# Patient Record
Sex: Female | Born: 1935 | Race: White | Hispanic: No | Smoking: Never smoker
Health system: Southern US, Community
[De-identification: ages and names within clinical notes are randomized; demographics above are authoritative.]

## PROBLEM LIST (undated history)

## (undated) DIAGNOSIS — M1990 Unspecified osteoarthritis, unspecified site: Secondary | ICD-10-CM

## (undated) DIAGNOSIS — R112 Nausea with vomiting, unspecified: Secondary | ICD-10-CM

## (undated) DIAGNOSIS — F32A Depression, unspecified: Secondary | ICD-10-CM

## (undated) DIAGNOSIS — I10 Essential (primary) hypertension: Secondary | ICD-10-CM

## (undated) DIAGNOSIS — G40909 Epilepsy, unspecified, not intractable, without status epilepticus: Secondary | ICD-10-CM

## (undated) DIAGNOSIS — E538 Deficiency of other specified B group vitamins: Secondary | ICD-10-CM

## (undated) DIAGNOSIS — N189 Chronic kidney disease, unspecified: Secondary | ICD-10-CM

## (undated) DIAGNOSIS — D649 Anemia, unspecified: Secondary | ICD-10-CM

## (undated) DIAGNOSIS — E785 Hyperlipidemia, unspecified: Secondary | ICD-10-CM

## (undated) DIAGNOSIS — Z9889 Other specified postprocedural states: Secondary | ICD-10-CM

## (undated) DIAGNOSIS — G894 Chronic pain syndrome: Secondary | ICD-10-CM

## (undated) DIAGNOSIS — R251 Tremor, unspecified: Secondary | ICD-10-CM

## (undated) DIAGNOSIS — G509 Disorder of trigeminal nerve, unspecified: Secondary | ICD-10-CM

## (undated) HISTORY — PX: CHOLECYSTECTOMY: SHX55

## (undated) HISTORY — PX: TONSILLECTOMY: SUR1361

## (undated) HISTORY — PX: TOTAL KNEE ARTHROPLASTY: SHX125

## (undated) HISTORY — DX: Disorder of trigeminal nerve, unspecified: G50.9

## (undated) HISTORY — DX: Anemia, unspecified: D64.9

## (undated) HISTORY — PX: UMBILICAL HERNIA REPAIR: SHX196

## (undated) HISTORY — DX: Deficiency of other specified B group vitamins: E53.8

## (undated) HISTORY — PX: BLADDER REPAIR: SHX76

## (undated) HISTORY — PX: CATARACT EXTRACTION: SUR2

## (undated) HISTORY — PX: NOSE SURGERY: SHX723

## (undated) HISTORY — DX: Tremor, unspecified: R25.1

## (undated) HISTORY — PX: SHOULDER ARTHROSCOPY DISTAL CLAVICLE EXCISION AND OPEN ROTATOR CUFF REPAIR: SHX2396

## (undated) HISTORY — PX: ABDOMINAL HYSTERECTOMY: SHX81

---

## 2002-10-26 HISTORY — PX: OTHER SURGICAL HISTORY: SHX169

## 2019-11-02 DIAGNOSIS — D519 Vitamin B12 deficiency anemia, unspecified: Secondary | ICD-10-CM | POA: Diagnosis not present

## 2019-11-02 DIAGNOSIS — Z6826 Body mass index (BMI) 26.0-26.9, adult: Secondary | ICD-10-CM | POA: Diagnosis not present

## 2019-11-02 DIAGNOSIS — R42 Dizziness and giddiness: Secondary | ICD-10-CM | POA: Diagnosis not present

## 2019-11-02 DIAGNOSIS — M1710 Unilateral primary osteoarthritis, unspecified knee: Secondary | ICD-10-CM | POA: Diagnosis not present

## 2019-11-02 DIAGNOSIS — G500 Trigeminal neuralgia: Secondary | ICD-10-CM | POA: Diagnosis not present

## 2019-11-06 DIAGNOSIS — H16223 Keratoconjunctivitis sicca, not specified as Sjogren's, bilateral: Secondary | ICD-10-CM | POA: Diagnosis not present

## 2019-11-06 DIAGNOSIS — Z961 Presence of intraocular lens: Secondary | ICD-10-CM | POA: Diagnosis not present

## 2019-11-06 DIAGNOSIS — H40023 Open angle with borderline findings, high risk, bilateral: Secondary | ICD-10-CM | POA: Diagnosis not present

## 2019-11-08 DIAGNOSIS — E875 Hyperkalemia: Secondary | ICD-10-CM | POA: Diagnosis not present

## 2019-11-28 DIAGNOSIS — M25561 Pain in right knee: Secondary | ICD-10-CM | POA: Diagnosis not present

## 2019-11-28 DIAGNOSIS — Z96652 Presence of left artificial knee joint: Secondary | ICD-10-CM | POA: Diagnosis not present

## 2019-11-28 DIAGNOSIS — Z96651 Presence of right artificial knee joint: Secondary | ICD-10-CM | POA: Diagnosis not present

## 2019-11-28 DIAGNOSIS — M25562 Pain in left knee: Secondary | ICD-10-CM | POA: Diagnosis not present

## 2019-12-06 DIAGNOSIS — Z23 Encounter for immunization: Secondary | ICD-10-CM | POA: Diagnosis not present

## 2020-01-03 DIAGNOSIS — Z23 Encounter for immunization: Secondary | ICD-10-CM | POA: Diagnosis not present

## 2020-01-26 DIAGNOSIS — Z6825 Body mass index (BMI) 25.0-25.9, adult: Secondary | ICD-10-CM | POA: Diagnosis not present

## 2020-01-26 DIAGNOSIS — R300 Dysuria: Secondary | ICD-10-CM | POA: Diagnosis not present

## 2020-02-02 DIAGNOSIS — G500 Trigeminal neuralgia: Secondary | ICD-10-CM | POA: Diagnosis not present

## 2020-02-02 DIAGNOSIS — H903 Sensorineural hearing loss, bilateral: Secondary | ICD-10-CM | POA: Diagnosis not present

## 2020-02-06 DIAGNOSIS — L299 Pruritus, unspecified: Secondary | ICD-10-CM | POA: Diagnosis not present

## 2020-02-06 DIAGNOSIS — K146 Glossodynia: Secondary | ICD-10-CM | POA: Diagnosis not present

## 2020-02-06 DIAGNOSIS — K219 Gastro-esophageal reflux disease without esophagitis: Secondary | ICD-10-CM | POA: Diagnosis not present

## 2020-02-06 DIAGNOSIS — G500 Trigeminal neuralgia: Secondary | ICD-10-CM | POA: Diagnosis not present

## 2020-02-06 DIAGNOSIS — R251 Tremor, unspecified: Secondary | ICD-10-CM | POA: Diagnosis not present

## 2020-02-06 DIAGNOSIS — M179 Osteoarthritis of knee, unspecified: Secondary | ICD-10-CM | POA: Diagnosis not present

## 2020-03-01 DIAGNOSIS — H60559 Acute reactive otitis externa, unspecified ear: Secondary | ICD-10-CM | POA: Diagnosis not present

## 2020-03-01 DIAGNOSIS — H6062 Unspecified chronic otitis externa, left ear: Secondary | ICD-10-CM | POA: Diagnosis not present

## 2020-03-07 DIAGNOSIS — G500 Trigeminal neuralgia: Secondary | ICD-10-CM | POA: Diagnosis not present

## 2020-03-07 DIAGNOSIS — Z6825 Body mass index (BMI) 25.0-25.9, adult: Secondary | ICD-10-CM | POA: Diagnosis not present

## 2020-03-07 DIAGNOSIS — I10 Essential (primary) hypertension: Secondary | ICD-10-CM | POA: Diagnosis not present

## 2020-03-07 DIAGNOSIS — M1710 Unilateral primary osteoarthritis, unspecified knee: Secondary | ICD-10-CM | POA: Diagnosis not present

## 2020-03-19 DIAGNOSIS — M79661 Pain in right lower leg: Secondary | ICD-10-CM | POA: Diagnosis not present

## 2020-03-19 DIAGNOSIS — M79662 Pain in left lower leg: Secondary | ICD-10-CM | POA: Diagnosis not present

## 2020-03-19 DIAGNOSIS — Z96653 Presence of artificial knee joint, bilateral: Secondary | ICD-10-CM | POA: Diagnosis not present

## 2020-03-28 DIAGNOSIS — K219 Gastro-esophageal reflux disease without esophagitis: Secondary | ICD-10-CM | POA: Diagnosis not present

## 2020-03-28 DIAGNOSIS — I959 Hypotension, unspecified: Secondary | ICD-10-CM | POA: Diagnosis not present

## 2020-03-28 DIAGNOSIS — F419 Anxiety disorder, unspecified: Secondary | ICD-10-CM | POA: Diagnosis not present

## 2020-03-28 DIAGNOSIS — E7800 Pure hypercholesterolemia, unspecified: Secondary | ICD-10-CM | POA: Diagnosis not present

## 2020-03-28 DIAGNOSIS — Z96652 Presence of left artificial knee joint: Secondary | ICD-10-CM | POA: Diagnosis not present

## 2020-03-28 DIAGNOSIS — R0789 Other chest pain: Secondary | ICD-10-CM | POA: Diagnosis not present

## 2020-03-28 DIAGNOSIS — Z79899 Other long term (current) drug therapy: Secondary | ICD-10-CM | POA: Diagnosis not present

## 2020-03-28 DIAGNOSIS — R918 Other nonspecific abnormal finding of lung field: Secondary | ICD-10-CM | POA: Diagnosis not present

## 2020-03-28 DIAGNOSIS — F329 Major depressive disorder, single episode, unspecified: Secondary | ICD-10-CM | POA: Diagnosis not present

## 2020-03-28 DIAGNOSIS — R079 Chest pain, unspecified: Secondary | ICD-10-CM | POA: Diagnosis not present

## 2020-03-28 DIAGNOSIS — G500 Trigeminal neuralgia: Secondary | ICD-10-CM | POA: Diagnosis not present

## 2020-03-28 DIAGNOSIS — E875 Hyperkalemia: Secondary | ICD-10-CM | POA: Diagnosis not present

## 2020-03-28 DIAGNOSIS — Z79891 Long term (current) use of opiate analgesic: Secondary | ICD-10-CM | POA: Diagnosis not present

## 2020-03-28 DIAGNOSIS — R001 Bradycardia, unspecified: Secondary | ICD-10-CM | POA: Diagnosis not present

## 2020-03-28 DIAGNOSIS — M797 Fibromyalgia: Secondary | ICD-10-CM | POA: Diagnosis not present

## 2020-03-28 DIAGNOSIS — Z791 Long term (current) use of non-steroidal anti-inflammatories (NSAID): Secondary | ICD-10-CM | POA: Diagnosis not present

## 2020-03-28 DIAGNOSIS — I10 Essential (primary) hypertension: Secondary | ICD-10-CM | POA: Diagnosis not present

## 2020-05-10 DIAGNOSIS — Z9181 History of falling: Secondary | ICD-10-CM | POA: Diagnosis not present

## 2020-05-10 DIAGNOSIS — G500 Trigeminal neuralgia: Secondary | ICD-10-CM | POA: Diagnosis not present

## 2020-05-10 DIAGNOSIS — I10 Essential (primary) hypertension: Secondary | ICD-10-CM | POA: Diagnosis not present

## 2020-05-10 DIAGNOSIS — M1710 Unilateral primary osteoarthritis, unspecified knee: Secondary | ICD-10-CM | POA: Diagnosis not present

## 2020-05-10 DIAGNOSIS — R609 Edema, unspecified: Secondary | ICD-10-CM | POA: Diagnosis not present

## 2020-05-10 DIAGNOSIS — Z6825 Body mass index (BMI) 25.0-25.9, adult: Secondary | ICD-10-CM | POA: Diagnosis not present

## 2020-05-13 DIAGNOSIS — H40023 Open angle with borderline findings, high risk, bilateral: Secondary | ICD-10-CM | POA: Diagnosis not present

## 2020-05-13 DIAGNOSIS — H16223 Keratoconjunctivitis sicca, not specified as Sjogren's, bilateral: Secondary | ICD-10-CM | POA: Diagnosis not present

## 2020-05-13 DIAGNOSIS — H5712 Ocular pain, left eye: Secondary | ICD-10-CM | POA: Diagnosis not present

## 2020-05-17 DIAGNOSIS — M6281 Muscle weakness (generalized): Secondary | ICD-10-CM | POA: Diagnosis not present

## 2020-05-17 DIAGNOSIS — R296 Repeated falls: Secondary | ICD-10-CM | POA: Diagnosis not present

## 2020-05-21 DIAGNOSIS — M6281 Muscle weakness (generalized): Secondary | ICD-10-CM | POA: Diagnosis not present

## 2020-05-21 DIAGNOSIS — R296 Repeated falls: Secondary | ICD-10-CM | POA: Diagnosis not present

## 2020-05-22 DIAGNOSIS — R296 Repeated falls: Secondary | ICD-10-CM | POA: Diagnosis not present

## 2020-05-22 DIAGNOSIS — M6281 Muscle weakness (generalized): Secondary | ICD-10-CM | POA: Diagnosis not present

## 2020-05-28 DIAGNOSIS — M6281 Muscle weakness (generalized): Secondary | ICD-10-CM | POA: Diagnosis not present

## 2020-05-28 DIAGNOSIS — R296 Repeated falls: Secondary | ICD-10-CM | POA: Diagnosis not present

## 2020-05-30 DIAGNOSIS — R296 Repeated falls: Secondary | ICD-10-CM | POA: Diagnosis not present

## 2020-05-30 DIAGNOSIS — M6281 Muscle weakness (generalized): Secondary | ICD-10-CM | POA: Diagnosis not present

## 2020-05-31 DIAGNOSIS — M6281 Muscle weakness (generalized): Secondary | ICD-10-CM | POA: Diagnosis not present

## 2020-05-31 DIAGNOSIS — R296 Repeated falls: Secondary | ICD-10-CM | POA: Diagnosis not present

## 2020-06-03 DIAGNOSIS — M6281 Muscle weakness (generalized): Secondary | ICD-10-CM | POA: Diagnosis not present

## 2020-06-03 DIAGNOSIS — R296 Repeated falls: Secondary | ICD-10-CM | POA: Diagnosis not present

## 2020-06-04 DIAGNOSIS — R296 Repeated falls: Secondary | ICD-10-CM | POA: Diagnosis not present

## 2020-06-04 DIAGNOSIS — M6281 Muscle weakness (generalized): Secondary | ICD-10-CM | POA: Diagnosis not present

## 2020-06-07 DIAGNOSIS — M6281 Muscle weakness (generalized): Secondary | ICD-10-CM | POA: Diagnosis not present

## 2020-06-07 DIAGNOSIS — R296 Repeated falls: Secondary | ICD-10-CM | POA: Diagnosis not present

## 2020-06-10 DIAGNOSIS — M6281 Muscle weakness (generalized): Secondary | ICD-10-CM | POA: Diagnosis not present

## 2020-06-10 DIAGNOSIS — R296 Repeated falls: Secondary | ICD-10-CM | POA: Diagnosis not present

## 2020-06-10 DIAGNOSIS — H40023 Open angle with borderline findings, high risk, bilateral: Secondary | ICD-10-CM | POA: Diagnosis not present

## 2020-06-10 DIAGNOSIS — H5712 Ocular pain, left eye: Secondary | ICD-10-CM | POA: Diagnosis not present

## 2020-06-10 DIAGNOSIS — H16223 Keratoconjunctivitis sicca, not specified as Sjogren's, bilateral: Secondary | ICD-10-CM | POA: Diagnosis not present

## 2020-06-13 DIAGNOSIS — R296 Repeated falls: Secondary | ICD-10-CM | POA: Diagnosis not present

## 2020-06-13 DIAGNOSIS — M6281 Muscle weakness (generalized): Secondary | ICD-10-CM | POA: Diagnosis not present

## 2020-06-14 DIAGNOSIS — M6281 Muscle weakness (generalized): Secondary | ICD-10-CM | POA: Diagnosis not present

## 2020-06-14 DIAGNOSIS — R296 Repeated falls: Secondary | ICD-10-CM | POA: Diagnosis not present

## 2020-06-17 DIAGNOSIS — M6281 Muscle weakness (generalized): Secondary | ICD-10-CM | POA: Diagnosis not present

## 2020-06-17 DIAGNOSIS — R296 Repeated falls: Secondary | ICD-10-CM | POA: Diagnosis not present

## 2020-06-20 DIAGNOSIS — M6281 Muscle weakness (generalized): Secondary | ICD-10-CM | POA: Diagnosis not present

## 2020-06-20 DIAGNOSIS — R296 Repeated falls: Secondary | ICD-10-CM | POA: Diagnosis not present

## 2020-06-25 DIAGNOSIS — M6281 Muscle weakness (generalized): Secondary | ICD-10-CM | POA: Diagnosis not present

## 2020-06-25 DIAGNOSIS — R296 Repeated falls: Secondary | ICD-10-CM | POA: Diagnosis not present

## 2020-06-28 DIAGNOSIS — M6281 Muscle weakness (generalized): Secondary | ICD-10-CM | POA: Diagnosis not present

## 2020-06-28 DIAGNOSIS — R296 Repeated falls: Secondary | ICD-10-CM | POA: Diagnosis not present

## 2020-07-02 DIAGNOSIS — R296 Repeated falls: Secondary | ICD-10-CM | POA: Diagnosis not present

## 2020-07-02 DIAGNOSIS — M6281 Muscle weakness (generalized): Secondary | ICD-10-CM | POA: Diagnosis not present

## 2020-07-12 DIAGNOSIS — M6281 Muscle weakness (generalized): Secondary | ICD-10-CM | POA: Diagnosis not present

## 2020-07-12 DIAGNOSIS — M545 Low back pain: Secondary | ICD-10-CM | POA: Diagnosis not present

## 2020-07-12 DIAGNOSIS — S0990XA Unspecified injury of head, initial encounter: Secondary | ICD-10-CM | POA: Diagnosis not present

## 2020-07-12 DIAGNOSIS — M546 Pain in thoracic spine: Secondary | ICD-10-CM | POA: Diagnosis not present

## 2020-07-13 DIAGNOSIS — I119 Hypertensive heart disease without heart failure: Secondary | ICD-10-CM | POA: Diagnosis not present

## 2020-07-13 DIAGNOSIS — N179 Acute kidney failure, unspecified: Secondary | ICD-10-CM | POA: Diagnosis not present

## 2020-07-13 DIAGNOSIS — Z20822 Contact with and (suspected) exposure to covid-19: Secondary | ICD-10-CM | POA: Diagnosis not present

## 2020-07-13 DIAGNOSIS — D649 Anemia, unspecified: Secondary | ICD-10-CM | POA: Diagnosis not present

## 2020-07-13 DIAGNOSIS — I358 Other nonrheumatic aortic valve disorders: Secondary | ICD-10-CM | POA: Diagnosis not present

## 2020-07-13 DIAGNOSIS — I959 Hypotension, unspecified: Secondary | ICD-10-CM | POA: Diagnosis not present

## 2020-07-13 DIAGNOSIS — R9431 Abnormal electrocardiogram [ECG] [EKG]: Secondary | ICD-10-CM | POA: Diagnosis not present

## 2020-07-13 DIAGNOSIS — R600 Localized edema: Secondary | ICD-10-CM | POA: Diagnosis not present

## 2020-07-13 DIAGNOSIS — Z86718 Personal history of other venous thrombosis and embolism: Secondary | ICD-10-CM | POA: Diagnosis not present

## 2020-07-13 DIAGNOSIS — Z79899 Other long term (current) drug therapy: Secondary | ICD-10-CM | POA: Diagnosis not present

## 2020-07-13 DIAGNOSIS — M545 Low back pain: Secondary | ICD-10-CM | POA: Diagnosis not present

## 2020-07-13 DIAGNOSIS — I272 Pulmonary hypertension, unspecified: Secondary | ICD-10-CM | POA: Diagnosis not present

## 2020-07-13 DIAGNOSIS — K219 Gastro-esophageal reflux disease without esophagitis: Secondary | ICD-10-CM | POA: Diagnosis not present

## 2020-07-13 DIAGNOSIS — F419 Anxiety disorder, unspecified: Secondary | ICD-10-CM | POA: Diagnosis not present

## 2020-07-13 DIAGNOSIS — F329 Major depressive disorder, single episode, unspecified: Secondary | ICD-10-CM | POA: Diagnosis not present

## 2020-07-13 DIAGNOSIS — R001 Bradycardia, unspecified: Secondary | ICD-10-CM | POA: Diagnosis not present

## 2020-07-13 DIAGNOSIS — I083 Combined rheumatic disorders of mitral, aortic and tricuspid valves: Secondary | ICD-10-CM | POA: Diagnosis not present

## 2020-07-13 DIAGNOSIS — R296 Repeated falls: Secondary | ICD-10-CM | POA: Diagnosis not present

## 2020-07-13 DIAGNOSIS — R778 Other specified abnormalities of plasma proteins: Secondary | ICD-10-CM | POA: Diagnosis not present

## 2020-07-13 DIAGNOSIS — G500 Trigeminal neuralgia: Secondary | ICD-10-CM | POA: Diagnosis not present

## 2020-07-13 DIAGNOSIS — N2889 Other specified disorders of kidney and ureter: Secondary | ICD-10-CM | POA: Diagnosis not present

## 2020-07-13 DIAGNOSIS — Z66 Do not resuscitate: Secondary | ICD-10-CM | POA: Diagnosis not present

## 2020-07-13 DIAGNOSIS — I517 Cardiomegaly: Secondary | ICD-10-CM | POA: Diagnosis not present

## 2020-07-13 DIAGNOSIS — R519 Headache, unspecified: Secondary | ICD-10-CM | POA: Diagnosis not present

## 2020-07-13 DIAGNOSIS — R569 Unspecified convulsions: Secondary | ICD-10-CM | POA: Diagnosis not present

## 2020-07-13 DIAGNOSIS — S39002A Unspecified injury of muscle, fascia and tendon of lower back, initial encounter: Secondary | ICD-10-CM | POA: Diagnosis not present

## 2020-07-13 DIAGNOSIS — I361 Nonrheumatic tricuspid (valve) insufficiency: Secondary | ICD-10-CM | POA: Diagnosis not present

## 2020-07-13 DIAGNOSIS — M47816 Spondylosis without myelopathy or radiculopathy, lumbar region: Secondary | ICD-10-CM | POA: Diagnosis not present

## 2020-07-13 DIAGNOSIS — I351 Nonrheumatic aortic (valve) insufficiency: Secondary | ICD-10-CM | POA: Diagnosis not present

## 2020-07-13 DIAGNOSIS — E871 Hypo-osmolality and hyponatremia: Secondary | ICD-10-CM | POA: Diagnosis not present

## 2020-07-13 DIAGNOSIS — Z7901 Long term (current) use of anticoagulants: Secondary | ICD-10-CM | POA: Diagnosis not present

## 2020-07-14 DIAGNOSIS — R778 Other specified abnormalities of plasma proteins: Secondary | ICD-10-CM | POA: Diagnosis not present

## 2020-07-14 DIAGNOSIS — I272 Pulmonary hypertension, unspecified: Secondary | ICD-10-CM | POA: Diagnosis not present

## 2020-07-14 DIAGNOSIS — W19XXXA Unspecified fall, initial encounter: Secondary | ICD-10-CM | POA: Diagnosis not present

## 2020-07-14 DIAGNOSIS — Y92099 Unspecified place in other non-institutional residence as the place of occurrence of the external cause: Secondary | ICD-10-CM | POA: Diagnosis not present

## 2020-07-14 DIAGNOSIS — R001 Bradycardia, unspecified: Secondary | ICD-10-CM | POA: Diagnosis not present

## 2020-07-14 DIAGNOSIS — I340 Nonrheumatic mitral (valve) insufficiency: Secondary | ICD-10-CM | POA: Diagnosis not present

## 2020-07-14 DIAGNOSIS — E871 Hypo-osmolality and hyponatremia: Secondary | ICD-10-CM | POA: Diagnosis not present

## 2020-07-14 DIAGNOSIS — I361 Nonrheumatic tricuspid (valve) insufficiency: Secondary | ICD-10-CM | POA: Diagnosis not present

## 2020-07-16 DIAGNOSIS — R2681 Unsteadiness on feet: Secondary | ICD-10-CM | POA: Diagnosis not present

## 2020-07-16 DIAGNOSIS — R278 Other lack of coordination: Secondary | ICD-10-CM | POA: Diagnosis not present

## 2020-07-16 DIAGNOSIS — M6281 Muscle weakness (generalized): Secondary | ICD-10-CM | POA: Diagnosis not present

## 2020-07-18 DIAGNOSIS — R778 Other specified abnormalities of plasma proteins: Secondary | ICD-10-CM | POA: Diagnosis not present

## 2020-07-18 DIAGNOSIS — I10 Essential (primary) hypertension: Secondary | ICD-10-CM | POA: Diagnosis not present

## 2020-07-18 DIAGNOSIS — I272 Pulmonary hypertension, unspecified: Secondary | ICD-10-CM | POA: Diagnosis not present

## 2020-07-18 DIAGNOSIS — I493 Ventricular premature depolarization: Secondary | ICD-10-CM | POA: Diagnosis not present

## 2020-07-25 DIAGNOSIS — M6281 Muscle weakness (generalized): Secondary | ICD-10-CM | POA: Diagnosis not present

## 2020-07-25 DIAGNOSIS — R2681 Unsteadiness on feet: Secondary | ICD-10-CM | POA: Diagnosis not present

## 2020-07-25 DIAGNOSIS — R278 Other lack of coordination: Secondary | ICD-10-CM | POA: Diagnosis not present

## 2020-07-26 DIAGNOSIS — M6281 Muscle weakness (generalized): Secondary | ICD-10-CM | POA: Diagnosis not present

## 2020-07-26 DIAGNOSIS — R278 Other lack of coordination: Secondary | ICD-10-CM | POA: Diagnosis not present

## 2020-07-26 DIAGNOSIS — R2681 Unsteadiness on feet: Secondary | ICD-10-CM | POA: Diagnosis not present

## 2020-07-29 DIAGNOSIS — M6281 Muscle weakness (generalized): Secondary | ICD-10-CM | POA: Diagnosis not present

## 2020-07-29 DIAGNOSIS — R2681 Unsteadiness on feet: Secondary | ICD-10-CM | POA: Diagnosis not present

## 2020-07-29 DIAGNOSIS — R278 Other lack of coordination: Secondary | ICD-10-CM | POA: Diagnosis not present

## 2020-07-30 DIAGNOSIS — R2681 Unsteadiness on feet: Secondary | ICD-10-CM | POA: Diagnosis not present

## 2020-07-30 DIAGNOSIS — M6281 Muscle weakness (generalized): Secondary | ICD-10-CM | POA: Diagnosis not present

## 2020-07-30 DIAGNOSIS — R278 Other lack of coordination: Secondary | ICD-10-CM | POA: Diagnosis not present

## 2020-07-31 DIAGNOSIS — R001 Bradycardia, unspecified: Secondary | ICD-10-CM | POA: Diagnosis not present

## 2020-08-01 DIAGNOSIS — R278 Other lack of coordination: Secondary | ICD-10-CM | POA: Diagnosis not present

## 2020-08-01 DIAGNOSIS — M6281 Muscle weakness (generalized): Secondary | ICD-10-CM | POA: Diagnosis not present

## 2020-08-01 DIAGNOSIS — R2681 Unsteadiness on feet: Secondary | ICD-10-CM | POA: Diagnosis not present

## 2020-08-02 DIAGNOSIS — R278 Other lack of coordination: Secondary | ICD-10-CM | POA: Diagnosis not present

## 2020-08-02 DIAGNOSIS — M6281 Muscle weakness (generalized): Secondary | ICD-10-CM | POA: Diagnosis not present

## 2020-08-02 DIAGNOSIS — R2681 Unsteadiness on feet: Secondary | ICD-10-CM | POA: Diagnosis not present

## 2020-08-05 DIAGNOSIS — R2681 Unsteadiness on feet: Secondary | ICD-10-CM | POA: Diagnosis not present

## 2020-08-05 DIAGNOSIS — M6281 Muscle weakness (generalized): Secondary | ICD-10-CM | POA: Diagnosis not present

## 2020-08-05 DIAGNOSIS — R278 Other lack of coordination: Secondary | ICD-10-CM | POA: Diagnosis not present

## 2020-08-08 DIAGNOSIS — R2681 Unsteadiness on feet: Secondary | ICD-10-CM | POA: Diagnosis not present

## 2020-08-08 DIAGNOSIS — R278 Other lack of coordination: Secondary | ICD-10-CM | POA: Diagnosis not present

## 2020-08-08 DIAGNOSIS — M6281 Muscle weakness (generalized): Secondary | ICD-10-CM | POA: Diagnosis not present

## 2020-08-13 DIAGNOSIS — R2681 Unsteadiness on feet: Secondary | ICD-10-CM | POA: Diagnosis not present

## 2020-08-13 DIAGNOSIS — R278 Other lack of coordination: Secondary | ICD-10-CM | POA: Diagnosis not present

## 2020-08-13 DIAGNOSIS — M6281 Muscle weakness (generalized): Secondary | ICD-10-CM | POA: Diagnosis not present

## 2020-08-14 DIAGNOSIS — R278 Other lack of coordination: Secondary | ICD-10-CM | POA: Diagnosis not present

## 2020-08-14 DIAGNOSIS — M6281 Muscle weakness (generalized): Secondary | ICD-10-CM | POA: Diagnosis not present

## 2020-08-14 DIAGNOSIS — R2681 Unsteadiness on feet: Secondary | ICD-10-CM | POA: Diagnosis not present

## 2020-08-16 DIAGNOSIS — R278 Other lack of coordination: Secondary | ICD-10-CM | POA: Diagnosis not present

## 2020-08-16 DIAGNOSIS — R2681 Unsteadiness on feet: Secondary | ICD-10-CM | POA: Diagnosis not present

## 2020-08-16 DIAGNOSIS — M6281 Muscle weakness (generalized): Secondary | ICD-10-CM | POA: Diagnosis not present

## 2020-08-19 DIAGNOSIS — M6281 Muscle weakness (generalized): Secondary | ICD-10-CM | POA: Diagnosis not present

## 2020-08-19 DIAGNOSIS — R2681 Unsteadiness on feet: Secondary | ICD-10-CM | POA: Diagnosis not present

## 2020-08-19 DIAGNOSIS — R278 Other lack of coordination: Secondary | ICD-10-CM | POA: Diagnosis not present

## 2020-08-22 DIAGNOSIS — R278 Other lack of coordination: Secondary | ICD-10-CM | POA: Diagnosis not present

## 2020-08-22 DIAGNOSIS — R2681 Unsteadiness on feet: Secondary | ICD-10-CM | POA: Diagnosis not present

## 2020-08-22 DIAGNOSIS — M6281 Muscle weakness (generalized): Secondary | ICD-10-CM | POA: Diagnosis not present

## 2020-08-23 DIAGNOSIS — R2681 Unsteadiness on feet: Secondary | ICD-10-CM | POA: Diagnosis not present

## 2020-08-23 DIAGNOSIS — M6281 Muscle weakness (generalized): Secondary | ICD-10-CM | POA: Diagnosis not present

## 2020-08-23 DIAGNOSIS — R278 Other lack of coordination: Secondary | ICD-10-CM | POA: Diagnosis not present

## 2020-08-29 DIAGNOSIS — M25542 Pain in joints of left hand: Secondary | ICD-10-CM | POA: Diagnosis not present

## 2020-08-29 DIAGNOSIS — M25541 Pain in joints of right hand: Secondary | ICD-10-CM | POA: Diagnosis not present

## 2020-08-29 DIAGNOSIS — M6281 Muscle weakness (generalized): Secondary | ICD-10-CM | POA: Diagnosis not present

## 2020-08-29 DIAGNOSIS — R278 Other lack of coordination: Secondary | ICD-10-CM | POA: Diagnosis not present

## 2020-09-02 DIAGNOSIS — M6281 Muscle weakness (generalized): Secondary | ICD-10-CM | POA: Diagnosis not present

## 2020-09-02 DIAGNOSIS — M25541 Pain in joints of right hand: Secondary | ICD-10-CM | POA: Diagnosis not present

## 2020-09-02 DIAGNOSIS — R278 Other lack of coordination: Secondary | ICD-10-CM | POA: Diagnosis not present

## 2020-09-02 DIAGNOSIS — M25542 Pain in joints of left hand: Secondary | ICD-10-CM | POA: Diagnosis not present

## 2020-09-04 DIAGNOSIS — Z23 Encounter for immunization: Secondary | ICD-10-CM | POA: Diagnosis not present

## 2020-09-04 DIAGNOSIS — M25541 Pain in joints of right hand: Secondary | ICD-10-CM | POA: Diagnosis not present

## 2020-09-04 DIAGNOSIS — R278 Other lack of coordination: Secondary | ICD-10-CM | POA: Diagnosis not present

## 2020-09-04 DIAGNOSIS — M25542 Pain in joints of left hand: Secondary | ICD-10-CM | POA: Diagnosis not present

## 2020-09-04 DIAGNOSIS — M6281 Muscle weakness (generalized): Secondary | ICD-10-CM | POA: Diagnosis not present

## 2020-09-06 DIAGNOSIS — H40023 Open angle with borderline findings, high risk, bilateral: Secondary | ICD-10-CM | POA: Diagnosis not present

## 2020-09-06 DIAGNOSIS — H5712 Ocular pain, left eye: Secondary | ICD-10-CM | POA: Diagnosis not present

## 2020-09-06 DIAGNOSIS — H16223 Keratoconjunctivitis sicca, not specified as Sjogren's, bilateral: Secondary | ICD-10-CM | POA: Diagnosis not present

## 2020-09-10 DIAGNOSIS — E7800 Pure hypercholesterolemia, unspecified: Secondary | ICD-10-CM | POA: Diagnosis not present

## 2020-09-10 DIAGNOSIS — R251 Tremor, unspecified: Secondary | ICD-10-CM | POA: Diagnosis not present

## 2020-09-10 DIAGNOSIS — G500 Trigeminal neuralgia: Secondary | ICD-10-CM | POA: Diagnosis not present

## 2020-09-10 DIAGNOSIS — K219 Gastro-esophageal reflux disease without esophagitis: Secondary | ICD-10-CM | POA: Diagnosis not present

## 2020-09-10 DIAGNOSIS — M1710 Unilateral primary osteoarthritis, unspecified knee: Secondary | ICD-10-CM | POA: Diagnosis not present

## 2020-09-10 DIAGNOSIS — D519 Vitamin B12 deficiency anemia, unspecified: Secondary | ICD-10-CM | POA: Diagnosis not present

## 2020-09-10 DIAGNOSIS — E875 Hyperkalemia: Secondary | ICD-10-CM | POA: Diagnosis not present

## 2020-09-10 DIAGNOSIS — I10 Essential (primary) hypertension: Secondary | ICD-10-CM | POA: Diagnosis not present

## 2020-09-17 DIAGNOSIS — M25542 Pain in joints of left hand: Secondary | ICD-10-CM | POA: Diagnosis not present

## 2020-09-17 DIAGNOSIS — M25541 Pain in joints of right hand: Secondary | ICD-10-CM | POA: Diagnosis not present

## 2020-09-17 DIAGNOSIS — M6281 Muscle weakness (generalized): Secondary | ICD-10-CM | POA: Diagnosis not present

## 2020-09-17 DIAGNOSIS — R278 Other lack of coordination: Secondary | ICD-10-CM | POA: Diagnosis not present

## 2020-09-24 DIAGNOSIS — R278 Other lack of coordination: Secondary | ICD-10-CM | POA: Diagnosis not present

## 2020-09-24 DIAGNOSIS — M6281 Muscle weakness (generalized): Secondary | ICD-10-CM | POA: Diagnosis not present

## 2020-09-24 DIAGNOSIS — M25541 Pain in joints of right hand: Secondary | ICD-10-CM | POA: Diagnosis not present

## 2020-09-24 DIAGNOSIS — M25542 Pain in joints of left hand: Secondary | ICD-10-CM | POA: Diagnosis not present

## 2020-09-26 DIAGNOSIS — M6281 Muscle weakness (generalized): Secondary | ICD-10-CM | POA: Diagnosis not present

## 2020-09-26 DIAGNOSIS — M25541 Pain in joints of right hand: Secondary | ICD-10-CM | POA: Diagnosis not present

## 2020-09-26 DIAGNOSIS — M25542 Pain in joints of left hand: Secondary | ICD-10-CM | POA: Diagnosis not present

## 2020-09-26 DIAGNOSIS — R278 Other lack of coordination: Secondary | ICD-10-CM | POA: Diagnosis not present

## 2020-10-04 DIAGNOSIS — R600 Localized edema: Secondary | ICD-10-CM | POA: Diagnosis not present

## 2020-10-04 DIAGNOSIS — I10 Essential (primary) hypertension: Secondary | ICD-10-CM | POA: Diagnosis not present

## 2020-10-07 DIAGNOSIS — R001 Bradycardia, unspecified: Secondary | ICD-10-CM | POA: Diagnosis not present

## 2020-10-22 DIAGNOSIS — M25542 Pain in joints of left hand: Secondary | ICD-10-CM | POA: Diagnosis not present

## 2020-10-22 DIAGNOSIS — M25541 Pain in joints of right hand: Secondary | ICD-10-CM | POA: Diagnosis not present

## 2020-10-22 DIAGNOSIS — M6281 Muscle weakness (generalized): Secondary | ICD-10-CM | POA: Diagnosis not present

## 2020-10-22 DIAGNOSIS — R278 Other lack of coordination: Secondary | ICD-10-CM | POA: Diagnosis not present

## 2020-10-24 DIAGNOSIS — R278 Other lack of coordination: Secondary | ICD-10-CM | POA: Diagnosis not present

## 2020-10-24 DIAGNOSIS — M25541 Pain in joints of right hand: Secondary | ICD-10-CM | POA: Diagnosis not present

## 2020-10-24 DIAGNOSIS — M6281 Muscle weakness (generalized): Secondary | ICD-10-CM | POA: Diagnosis not present

## 2020-10-24 DIAGNOSIS — M25542 Pain in joints of left hand: Secondary | ICD-10-CM | POA: Diagnosis not present

## 2020-10-28 DIAGNOSIS — I10 Essential (primary) hypertension: Secondary | ICD-10-CM | POA: Diagnosis not present

## 2021-01-03 DIAGNOSIS — I10 Essential (primary) hypertension: Secondary | ICD-10-CM | POA: Diagnosis not present

## 2021-01-03 DIAGNOSIS — I272 Pulmonary hypertension, unspecified: Secondary | ICD-10-CM | POA: Diagnosis not present

## 2021-01-13 DIAGNOSIS — R42 Dizziness and giddiness: Secondary | ICD-10-CM | POA: Diagnosis not present

## 2021-01-13 DIAGNOSIS — G500 Trigeminal neuralgia: Secondary | ICD-10-CM | POA: Diagnosis not present

## 2021-01-13 DIAGNOSIS — D519 Vitamin B12 deficiency anemia, unspecified: Secondary | ICD-10-CM | POA: Diagnosis not present

## 2021-01-13 DIAGNOSIS — Z6825 Body mass index (BMI) 25.0-25.9, adult: Secondary | ICD-10-CM | POA: Diagnosis not present

## 2021-01-13 DIAGNOSIS — I10 Essential (primary) hypertension: Secondary | ICD-10-CM | POA: Diagnosis not present

## 2021-04-04 DIAGNOSIS — H5712 Ocular pain, left eye: Secondary | ICD-10-CM | POA: Diagnosis not present

## 2021-04-04 DIAGNOSIS — H16223 Keratoconjunctivitis sicca, not specified as Sjogren's, bilateral: Secondary | ICD-10-CM | POA: Diagnosis not present

## 2021-04-04 DIAGNOSIS — H40023 Open angle with borderline findings, high risk, bilateral: Secondary | ICD-10-CM | POA: Diagnosis not present

## 2021-04-04 DIAGNOSIS — Z961 Presence of intraocular lens: Secondary | ICD-10-CM | POA: Diagnosis not present

## 2021-04-28 DIAGNOSIS — Z20822 Contact with and (suspected) exposure to covid-19: Secondary | ICD-10-CM | POA: Diagnosis not present

## 2021-06-05 DIAGNOSIS — G500 Trigeminal neuralgia: Secondary | ICD-10-CM | POA: Diagnosis not present

## 2021-06-05 DIAGNOSIS — E7800 Pure hypercholesterolemia, unspecified: Secondary | ICD-10-CM | POA: Diagnosis not present

## 2021-06-05 DIAGNOSIS — I10 Essential (primary) hypertension: Secondary | ICD-10-CM | POA: Diagnosis not present

## 2021-06-05 DIAGNOSIS — M1710 Unilateral primary osteoarthritis, unspecified knee: Secondary | ICD-10-CM | POA: Diagnosis not present

## 2021-06-05 DIAGNOSIS — D519 Vitamin B12 deficiency anemia, unspecified: Secondary | ICD-10-CM | POA: Diagnosis not present

## 2021-06-05 DIAGNOSIS — Z6824 Body mass index (BMI) 24.0-24.9, adult: Secondary | ICD-10-CM | POA: Diagnosis not present

## 2021-06-05 DIAGNOSIS — R251 Tremor, unspecified: Secondary | ICD-10-CM | POA: Diagnosis not present

## 2021-07-28 DIAGNOSIS — M25562 Pain in left knee: Secondary | ICD-10-CM | POA: Diagnosis not present

## 2021-07-28 DIAGNOSIS — M545 Low back pain, unspecified: Secondary | ICD-10-CM | POA: Diagnosis not present

## 2021-07-28 DIAGNOSIS — K120 Recurrent oral aphthae: Secondary | ICD-10-CM | POA: Diagnosis not present

## 2021-07-28 DIAGNOSIS — E538 Deficiency of other specified B group vitamins: Secondary | ICD-10-CM | POA: Diagnosis not present

## 2021-07-28 DIAGNOSIS — G479 Sleep disorder, unspecified: Secondary | ICD-10-CM | POA: Diagnosis not present

## 2021-07-28 DIAGNOSIS — R238 Other skin changes: Secondary | ICD-10-CM | POA: Diagnosis not present

## 2021-07-28 DIAGNOSIS — Z87898 Personal history of other specified conditions: Secondary | ICD-10-CM | POA: Diagnosis not present

## 2021-07-28 DIAGNOSIS — M25561 Pain in right knee: Secondary | ICD-10-CM | POA: Diagnosis not present

## 2021-07-28 DIAGNOSIS — G8929 Other chronic pain: Secondary | ICD-10-CM | POA: Diagnosis not present

## 2021-08-04 DIAGNOSIS — Z23 Encounter for immunization: Secondary | ICD-10-CM | POA: Diagnosis not present

## 2021-08-22 DIAGNOSIS — M159 Polyosteoarthritis, unspecified: Secondary | ICD-10-CM | POA: Diagnosis not present

## 2021-08-22 DIAGNOSIS — I272 Pulmonary hypertension, unspecified: Secondary | ICD-10-CM | POA: Diagnosis not present

## 2021-08-22 DIAGNOSIS — G8929 Other chronic pain: Secondary | ICD-10-CM | POA: Diagnosis not present

## 2021-08-22 DIAGNOSIS — K219 Gastro-esophageal reflux disease without esophagitis: Secondary | ICD-10-CM | POA: Diagnosis not present

## 2021-08-22 DIAGNOSIS — M810 Age-related osteoporosis without current pathological fracture: Secondary | ICD-10-CM | POA: Diagnosis not present

## 2021-08-22 DIAGNOSIS — D518 Other vitamin B12 deficiency anemias: Secondary | ICD-10-CM | POA: Diagnosis not present

## 2021-08-22 DIAGNOSIS — M170 Bilateral primary osteoarthritis of knee: Secondary | ICD-10-CM | POA: Diagnosis not present

## 2021-08-22 DIAGNOSIS — F329 Major depressive disorder, single episode, unspecified: Secondary | ICD-10-CM | POA: Diagnosis not present

## 2021-08-28 DIAGNOSIS — Z973 Presence of spectacles and contact lenses: Secondary | ICD-10-CM | POA: Diagnosis not present

## 2021-08-28 DIAGNOSIS — R296 Repeated falls: Secondary | ICD-10-CM | POA: Diagnosis not present

## 2021-08-28 DIAGNOSIS — F329 Major depressive disorder, single episode, unspecified: Secondary | ICD-10-CM | POA: Diagnosis not present

## 2021-08-28 DIAGNOSIS — R7301 Impaired fasting glucose: Secondary | ICD-10-CM | POA: Diagnosis not present

## 2021-08-28 DIAGNOSIS — R42 Dizziness and giddiness: Secondary | ICD-10-CM | POA: Diagnosis not present

## 2021-08-28 DIAGNOSIS — I071 Rheumatic tricuspid insufficiency: Secondary | ICD-10-CM | POA: Diagnosis not present

## 2021-08-28 DIAGNOSIS — G8929 Other chronic pain: Secondary | ICD-10-CM | POA: Diagnosis not present

## 2021-08-28 DIAGNOSIS — R454 Irritability and anger: Secondary | ICD-10-CM | POA: Diagnosis not present

## 2021-09-05 DIAGNOSIS — R7989 Other specified abnormal findings of blood chemistry: Secondary | ICD-10-CM | POA: Diagnosis not present

## 2021-09-06 DIAGNOSIS — Z20828 Contact with and (suspected) exposure to other viral communicable diseases: Secondary | ICD-10-CM | POA: Diagnosis not present

## 2021-09-11 DIAGNOSIS — M48061 Spinal stenosis, lumbar region without neurogenic claudication: Secondary | ICD-10-CM | POA: Diagnosis not present

## 2021-09-11 DIAGNOSIS — M545 Low back pain, unspecified: Secondary | ICD-10-CM | POA: Diagnosis not present

## 2021-09-11 DIAGNOSIS — Z96653 Presence of artificial knee joint, bilateral: Secondary | ICD-10-CM | POA: Diagnosis not present

## 2021-09-16 DIAGNOSIS — M5451 Vertebrogenic low back pain: Secondary | ICD-10-CM | POA: Diagnosis not present

## 2021-09-25 DIAGNOSIS — M5451 Vertebrogenic low back pain: Secondary | ICD-10-CM | POA: Diagnosis not present

## 2021-10-07 ENCOUNTER — Ambulatory Visit (INDEPENDENT_AMBULATORY_CARE_PROVIDER_SITE_OTHER): Payer: Medicare Other | Admitting: Cardiovascular Disease

## 2021-10-07 ENCOUNTER — Other Ambulatory Visit: Payer: Self-pay

## 2021-10-07 ENCOUNTER — Encounter: Payer: Self-pay | Admitting: Cardiovascular Disease

## 2021-10-07 VITALS — BP 130/56 | HR 64 | Ht 64.0 in | Wt 146.4 lb

## 2021-10-07 DIAGNOSIS — I272 Pulmonary hypertension, unspecified: Secondary | ICD-10-CM | POA: Diagnosis not present

## 2021-10-07 DIAGNOSIS — R0989 Other specified symptoms and signs involving the circulatory and respiratory systems: Secondary | ICD-10-CM | POA: Diagnosis not present

## 2021-10-07 DIAGNOSIS — E782 Mixed hyperlipidemia: Secondary | ICD-10-CM

## 2021-10-07 DIAGNOSIS — E785 Hyperlipidemia, unspecified: Secondary | ICD-10-CM | POA: Insufficient documentation

## 2021-10-07 DIAGNOSIS — I071 Rheumatic tricuspid insufficiency: Secondary | ICD-10-CM | POA: Insufficient documentation

## 2021-10-07 DIAGNOSIS — I361 Nonrheumatic tricuspid (valve) insufficiency: Secondary | ICD-10-CM | POA: Diagnosis not present

## 2021-10-07 DIAGNOSIS — I10 Essential (primary) hypertension: Secondary | ICD-10-CM | POA: Diagnosis not present

## 2021-10-07 DIAGNOSIS — R0789 Other chest pain: Secondary | ICD-10-CM | POA: Diagnosis not present

## 2021-10-07 NOTE — Patient Instructions (Addendum)
Medication Instructions:  Your physician recommends that you continue on your current medications as directed. Please refer to the Current Medication list given to you today.  *If you need a refill on your cardiac medications before your next appointment, please call your pharmacy*   Testing/Procedures: Your physician has requested that you have an echocardiogram. Echocardiography is a painless test that uses sound waves to create images of your heart. It provides your doctor with information about the size and shape of your heart and how well your hearts chambers and valves are working. This procedure takes approximately one hour. There are no restrictions for this procedure. This procedure is done at [address].  Your physician has requested that you have a lexiscan myoview. For further information please visit [email or web address] Please follow instruction sheet, as given. This will take place at [address] [facility], [address]  How to prepare for your Myocardial Perfusion Test: Do not eat or drink 3 hours prior to your test, except you may have water. Do not consume products containing caffeine (regular or decaffeinated) 12 hours prior to your test. (ex: coffee, chocolate, sodas, tea). Do bring a list of your current medications with you.  If not listed below, you may take your medications as normal. Do wear comfortable clothes (no dresses or overalls) and walking shoes, tennis shoes preferred (No heels or open toe shoes are allowed). Do NOT wear cologne, perfume, aftershave, or lotions (deodorant is allowed). The test will take approximately 3 to 4 hours to complete If these instructions are not followed, your test will have to be rescheduled.  Your physician has requested that you have a carotid duplex. This test is an ultrasound of the carotid arteries in your neck. It looks at blood flow through these arteries that supply the brain with blood. Allow one hour for this exam. There are  no restrictions or special instructions. This procedure is done at [address] [facility].    Follow-Up: At [facility], you and your health needs are our priority.  As part of our continuing mission to provide you with exceptional heart care, we have created designated Provider Care Teams.  These Care Teams include your primary Cardiologist (physician) and Advanced Practice Providers (APPs -  Physician Assistants and Nurse Practitioners) who all work together to provide you with the care you need, when you need it.  We recommend signing up for the patient portal called "MyChart".  Sign up information is provided on this After Visit Summary.  MyChart is used to connect with patients for Virtual Visits (Telemedicine).  Patients are able to view lab/test results, encounter notes, upcoming appointments, etc.  Non-urgent messages can be sent to your provider as well.   To learn more about what you can do with MyChart, go to [email or web address].    Your next appointment:   12 month(s)  The format for your next appointment:   In Person  Provider:   Nanetta Batty, MD

## 2021-10-07 NOTE — Addendum Note (Signed)
Addended by: Bernita Buffy on: 10/07/2021 04:26 PM   Modules accepted: Orders

## 2021-10-07 NOTE — Assessment & Plan Note (Signed)
History of hyperlipidemia on statin therapy followed by her PCP. 

## 2021-10-07 NOTE — Progress Notes (Signed)
10/07/2021 Kristen [name]   27-Jun-1933  [id number]  Primary Physician Kathalene Frames, MD Primary Cardiologist: Lorretta Harp MD Lupe Carney, [place]  HPI:  Kristen [name] is a 87 y.o. mildly overweight widowed Caucasian female mother of 3 children, grandmother of 3 grandchildren is accompanied by her daughter Manuela Schwartz who is retired Marine scientist and wife of Dr. Julieanne Manson, oncologist.  She is retired Education officer, museum.  She was referred by her primary care provider, Dr. Okey Dupre to be established because of prior history of tricuspid regurgitation.  Her risk factors include treated hypertension and hyperlipidemia.  She is never had a heart attack or stroke.  She has had bilateral total knee replacements.  She had been followed by a cardiologist in [facility] for tricuspid regurgitation.  She recently relocated [place] to the [place] area.  She does get exertional angina which she has had for the last 2 years.  She denies dyspnea on exertion.   Current Meds  Medication Sig   atorvastatin (LIPITOR) 20 MG tablet Take 20 mg by mouth daily.   cyanocobalamin (,VITAMIN B-12,) 1000 MCG/ML injection Inject into the muscle.   DULoxetine (CYMBALTA) 30 MG capsule Take 30 mg by mouth daily.   DULoxetine (CYMBALTA) 60 MG capsule Take 60 mg by mouth daily.   esomeprazole (NEXIUM) 40 MG capsule Take 40 mg by mouth daily.   gabapentin (NEURONTIN) 300 MG capsule Take 300 mg by mouth daily.   HYDROmorphone (DILAUDID) 2 MG tablet Take 2 mg by mouth 4 (four) times daily.   losartan (COZAAR) 50 MG tablet Take 50 mg by mouth daily.   meloxicam (MOBIC) 15 MG tablet Take by mouth.   nebivolol (BYSTOLIC) 5 MG tablet Take 5 mg by mouth daily.   primidone (MYSOLINE) 50 MG tablet Take 50 mg by mouth at bedtime.   traZODone (DESYREL) 100 MG tablet Take 100 mg by mouth at bedtime.     Allergies  Allergen Reactions   Penicillins Hives and Rash    Other reaction(s): Other unknown    Tetracyclines &  Related Anaphylaxis    Other reaction(s): Other unknown    Sulfa Antibiotics Hives and Other (See Comments)    unknown    Social History   Socioeconomic History   Marital status: Widowed    Spouse name: Not on file   Number of children: Not on file   Years of education: Not on file   Highest education level: Not on file  Occupational History   Not on file  Tobacco Use   Smoking status: Never    Passive exposure: Never   Smokeless tobacco: Never  Substance and Sexual Activity   Alcohol use: Not on file   Drug use: Not on file   Sexual activity: Not on file  Other Topics Concern   Not on file  Social History Narrative   Not on file   Social Determinants of Health   Financial Resource Strain: Not on file  Food Insecurity: Not on file  Transportation Needs: Not on file  Physical Activity: Not on file  Stress: Not on file  Social Connections: Not on file  Intimate Partner Violence: Not on file     Review of Systems: General: negative for chills, fever, night sweats or weight changes.  Cardiovascular: negative for chest pain, dyspnea on exertion, edema, orthopnea, palpitations, paroxysmal nocturnal dyspnea or shortness of breath Dermatological: negative for rash Respiratory: negative for cough or wheezing Urologic: negative for hematuria Abdominal: negative for nausea,  vomiting, diarrhea, bright red blood per rectum, melena, or hematemesis Neurologic: negative for visual changes, syncope, or dizziness All other systems reviewed and are otherwise negative except as noted above.    Blood pressure (!) 130/56, pulse 64, height 5\' 4"  (1.626 m), weight 146 lb 6.4 oz (66.4 kg), SpO2 95 %.  General appearance: alert and no distress Neck: no adenopathy, no carotid bruit, no JVD, supple, symmetrical, trachea midline, and thyroid not enlarged, symmetric, no tenderness/mass/nodules Lungs: clear to auscultation bilaterally Heart: regular rate and rhythm, S1, S2 normal, no  murmur, click, rub or gallop Extremities: extremities normal, atraumatic, no cyanosis or edema Pulses: 2+ and symmetric Skin: Skin color, texture, turgor normal. No rashes or lesions Neurologic: Grossly normal  EKG sinus rhythm at 64 without ST or T wave changes.  Personally reviewed this EKG.  ASSESSMENT AND PLAN:   Tricuspid regurgitation History of severe TR probably related to pulmonary hypertension previously followed by a cardiologist in [place].  We will recheck a 2D echocardiogram.  Hyperlipidemia History of hyperlipidemia on statin therapy followed by her PCP  Essential hypertension History of essential hypertension a blood pressure measured today at 130/56.  She is on Bystolic and losartan.  Atypical chest pain 2 years of chest pain which is somewhat exertional in nature.  We talked about whether or not she would be amenable to cardiac catheterization should we find coronary artery disease on noninvasive imaging.  I am going to get a Lexiscan Myoview stress test.  If this is high risk we will have another conversation regarding this, if low risk we will continue to treat her medically.     Bellaire MD FACP,FACC,FAHA, [facility] 10/07/2021 2:52 PM

## 2021-10-07 NOTE — Assessment & Plan Note (Signed)
2 years of chest pain which is somewhat exertional in nature.  We talked about whether or not she would be amenable to cardiac catheterization should we find coronary artery disease on noninvasive imaging.  I am going to get a Lexiscan Myoview stress test.  If this is high risk we will have another conversation regarding this, if low risk we will continue to treat her medically.

## 2021-10-07 NOTE — Assessment & Plan Note (Signed)
History of severe TR probably related to pulmonary hypertension previously followed by a cardiologist in [place].  We will recheck a 2D echocardiogram.

## 2021-10-07 NOTE — Assessment & Plan Note (Signed)
History of essential hypertension a blood pressure measured today at 130/56.  She is on Bystolic and losartan.

## 2021-10-09 NOTE — Addendum Note (Signed)
Addended by: Runell Gess on: 10/09/2021 09:28 AM   Modules accepted: Orders

## 2021-10-13 ENCOUNTER — Telehealth: Payer: Self-pay | Admitting: Cardiovascular Disease

## 2021-10-13 NOTE — Telephone Encounter (Signed)
Patient's daughter states the patient does not want any procedures if the tests find anything. She says if there are medication changes that is fine, but she does not want any procedures. She wants to make sure if the patient does have the carotid, echo, and stress tests that the office knows her decision.

## 2021-10-13 NOTE — Telephone Encounter (Signed)
Message sent to provider's nurse.

## 2021-10-14 DIAGNOSIS — F329 Major depressive disorder, single episode, unspecified: Secondary | ICD-10-CM | POA: Diagnosis not present

## 2021-10-14 DIAGNOSIS — K219 Gastro-esophageal reflux disease without esophagitis: Secondary | ICD-10-CM | POA: Diagnosis not present

## 2021-10-14 DIAGNOSIS — D518 Other vitamin B12 deficiency anemias: Secondary | ICD-10-CM | POA: Diagnosis not present

## 2021-10-14 DIAGNOSIS — M810 Age-related osteoporosis without current pathological fracture: Secondary | ICD-10-CM | POA: Diagnosis not present

## 2021-10-14 DIAGNOSIS — G8929 Other chronic pain: Secondary | ICD-10-CM | POA: Diagnosis not present

## 2021-10-14 DIAGNOSIS — I272 Pulmonary hypertension, unspecified: Secondary | ICD-10-CM | POA: Diagnosis not present

## 2021-10-14 DIAGNOSIS — M159 Polyosteoarthritis, unspecified: Secondary | ICD-10-CM | POA: Diagnosis not present

## 2021-10-14 DIAGNOSIS — M6281 Muscle weakness (generalized): Secondary | ICD-10-CM | POA: Diagnosis not present

## 2021-10-15 NOTE — Telephone Encounter (Signed)
° °  Follow up..  She said she has meeting by 4 pm if someone can call her before 4 pm

## 2021-10-15 NOTE — Telephone Encounter (Signed)
Spoke to pt's daughter. She states "I just didn't want [place] to do all this testing just to cause her to worry. I don't want the only option to be something invasive. We will do the test if there will be other options beside a cath or something like that. I just wanted the office to be aware. We are scheduled for the Lexiscan 12/30" Will relay message to Dr. Allyson Sabal.

## 2021-10-15 NOTE — Telephone Encounter (Signed)
° °  [name] is calling back, she would like to speak with Dr. Hazle Coca nurse regarding pt's tests ordered by Dr. Allyson Sabal

## 2021-10-16 DIAGNOSIS — M6281 Muscle weakness (generalized): Secondary | ICD-10-CM | POA: Diagnosis not present

## 2021-10-21 DIAGNOSIS — M6281 Muscle weakness (generalized): Secondary | ICD-10-CM | POA: Diagnosis not present

## 2021-10-22 ENCOUNTER — Telehealth: Payer: Self-pay | Admitting: Cardiovascular Disease

## 2021-10-22 ENCOUNTER — Telehealth (HOSPITAL_COMMUNITY): Payer: Self-pay | Admitting: *Deleted

## 2021-10-22 ENCOUNTER — Ambulatory Visit (HOSPITAL_COMMUNITY)
Admission: RE | Admit: 2021-10-22 | Payer: 59 | Source: Ambulatory Visit | Attending: Cardiovascular Disease | Admitting: Cardiovascular Disease

## 2021-10-22 NOTE — Telephone Encounter (Signed)
Close encounter 

## 2021-10-22 NOTE — Telephone Encounter (Signed)
Returned call to daughter (DPR) she wanted to ask questions about upcoming testing with pt. All questions answered. Forwarded records request to chart prep.

## 2021-10-22 NOTE — Telephone Encounter (Signed)
Pt is wanting our office to contact Dr. Sharlee Blew @ [facility] office to request her medical records... telephone number is [phone]

## 2021-10-23 DIAGNOSIS — M6281 Muscle weakness (generalized): Secondary | ICD-10-CM | POA: Diagnosis not present

## 2021-10-24 ENCOUNTER — Ambulatory Visit (HOSPITAL_COMMUNITY)
Admission: RE | Admit: 2021-10-24 | Discharge: 2021-10-24 | Disposition: A | Discharge status: Home / Self Care | Payer: Medicare Other | Source: Ambulatory Visit | Attending: Internal Medicine | Admitting: Internal Medicine

## 2021-10-24 ENCOUNTER — Other Ambulatory Visit: Payer: Self-pay

## 2021-10-24 DIAGNOSIS — I272 Pulmonary hypertension, unspecified: Secondary | ICD-10-CM | POA: Diagnosis not present

## 2021-10-24 DIAGNOSIS — R0989 Other specified symptoms and signs involving the circulatory and respiratory systems: Secondary | ICD-10-CM | POA: Insufficient documentation

## 2021-10-24 DIAGNOSIS — I10 Essential (primary) hypertension: Secondary | ICD-10-CM | POA: Insufficient documentation

## 2021-10-24 DIAGNOSIS — I361 Nonrheumatic tricuspid (valve) insufficiency: Secondary | ICD-10-CM | POA: Diagnosis not present

## 2021-10-24 DIAGNOSIS — E782 Mixed hyperlipidemia: Secondary | ICD-10-CM | POA: Diagnosis not present

## 2021-10-24 LAB — MYOCARDIAL PERFUSION IMAGING
LV dias vol: 100 mL (ref 46–106)
LV sys vol: 39 mL
Nuc Stress EF: 61 %
Peak HR: 75 {beats}/min
Rest HR: 56 {beats}/min
Rest Nuclear Isotope Dose: 10.1 mCi
SDS: 0
SRS: 0
SSS: 0
ST Depression (mm): 0 mm
Stress Nuclear Isotope Dose: 29.4 mCi
TID: 1.05

## 2021-10-24 MED ORDER — TECHNETIUM TC 99M TETROFOSMIN IV KIT
29.4000 | PACK | Freq: Once | INTRAVENOUS | Status: AC | PRN
  Administered 2021-10-24: 29.4 via INTRAVENOUS
  Filled 2021-10-24: qty 30

## 2021-10-24 MED ORDER — TECHNETIUM TC 99M TETROFOSMIN IV KIT
10.1000 | PACK | Freq: Once | INTRAVENOUS | Status: AC | PRN
  Administered 2021-10-24: 10.1 via INTRAVENOUS
  Filled 2021-10-24: qty 11

## 2021-10-24 MED ORDER — AMINOPHYLLINE 25 MG/ML IV SOLN
75.0000 mg | Freq: Once | INTRAVENOUS | Status: AC
  Administered 2021-10-24: 75 mg via INTRAVENOUS

## 2021-10-24 MED ORDER — REGADENOSON 0.4 MG/5ML IV SOLN
0.4000 mg | Freq: Once | INTRAVENOUS | Status: AC
Start: 2021-10-24 — End: 2021-10-24
  Administered 2021-10-24: 0.4 mg via INTRAVENOUS

## 2021-11-03 DIAGNOSIS — F329 Major depressive disorder, single episode, unspecified: Secondary | ICD-10-CM | POA: Diagnosis not present

## 2021-11-03 DIAGNOSIS — I272 Pulmonary hypertension, unspecified: Secondary | ICD-10-CM | POA: Diagnosis not present

## 2021-11-03 DIAGNOSIS — G8929 Other chronic pain: Secondary | ICD-10-CM | POA: Diagnosis not present

## 2021-11-03 DIAGNOSIS — K219 Gastro-esophageal reflux disease without esophagitis: Secondary | ICD-10-CM | POA: Diagnosis not present

## 2021-11-03 DIAGNOSIS — M810 Age-related osteoporosis without current pathological fracture: Secondary | ICD-10-CM | POA: Diagnosis not present

## 2021-11-03 DIAGNOSIS — M159 Polyosteoarthritis, unspecified: Secondary | ICD-10-CM | POA: Diagnosis not present

## 2021-11-03 DIAGNOSIS — M170 Bilateral primary osteoarthritis of knee: Secondary | ICD-10-CM | POA: Diagnosis not present

## 2021-11-03 DIAGNOSIS — D509 Iron deficiency anemia, unspecified: Secondary | ICD-10-CM | POA: Diagnosis not present

## 2021-11-03 DIAGNOSIS — N1832 Chronic kidney disease, stage 3b: Secondary | ICD-10-CM | POA: Diagnosis not present

## 2021-11-04 DIAGNOSIS — M6281 Muscle weakness (generalized): Secondary | ICD-10-CM | POA: Diagnosis not present

## 2021-11-06 DIAGNOSIS — M6281 Muscle weakness (generalized): Secondary | ICD-10-CM | POA: Diagnosis not present

## 2021-11-10 ENCOUNTER — Encounter (HOSPITAL_COMMUNITY): Payer: Self-pay | Admitting: Cardiovascular Disease

## 2021-11-10 ENCOUNTER — Ambulatory Visit (HOSPITAL_COMMUNITY): Payer: BC Managed Care – PPO | Attending: Cardiology

## 2021-11-11 DIAGNOSIS — M6281 Muscle weakness (generalized): Secondary | ICD-10-CM | POA: Diagnosis not present

## 2021-11-13 DIAGNOSIS — M6281 Muscle weakness (generalized): Secondary | ICD-10-CM | POA: Diagnosis not present

## 2021-11-18 DIAGNOSIS — M6281 Muscle weakness (generalized): Secondary | ICD-10-CM | POA: Diagnosis not present

## 2021-11-20 DIAGNOSIS — M6281 Muscle weakness (generalized): Secondary | ICD-10-CM | POA: Diagnosis not present

## 2021-11-23 ENCOUNTER — Emergency Department (HOSPITAL_COMMUNITY): Payer: Medicare Other

## 2021-11-23 ENCOUNTER — Other Ambulatory Visit: Payer: Self-pay

## 2021-11-23 ENCOUNTER — Encounter (HOSPITAL_COMMUNITY): Payer: Self-pay | Admitting: Emergency Medicine

## 2021-11-23 ENCOUNTER — Inpatient Hospital Stay (HOSPITAL_COMMUNITY)
Admission: EM | Admit: 2021-11-23 | Discharge: 2021-11-26 | DRG: 175 | Disposition: A | Discharge status: Home / Self Care | Payer: Medicare Other | Attending: Internal Medicine | Admitting: Internal Medicine

## 2021-11-23 DIAGNOSIS — R0789 Other chest pain: Secondary | ICD-10-CM | POA: Diagnosis not present

## 2021-11-23 DIAGNOSIS — G8929 Other chronic pain: Secondary | ICD-10-CM | POA: Diagnosis present

## 2021-11-23 DIAGNOSIS — J9811 Atelectasis: Secondary | ICD-10-CM | POA: Diagnosis not present

## 2021-11-23 DIAGNOSIS — I2510 Atherosclerotic heart disease of native coronary artery without angina pectoris: Secondary | ICD-10-CM | POA: Diagnosis present

## 2021-11-23 DIAGNOSIS — R911 Solitary pulmonary nodule: Secondary | ICD-10-CM | POA: Diagnosis not present

## 2021-11-23 DIAGNOSIS — Z79899 Other long term (current) drug therapy: Secondary | ICD-10-CM

## 2021-11-23 DIAGNOSIS — N1831 Chronic kidney disease, stage 3a: Secondary | ICD-10-CM | POA: Diagnosis present

## 2021-11-23 DIAGNOSIS — J90 Pleural effusion, not elsewhere classified: Secondary | ICD-10-CM | POA: Diagnosis not present

## 2021-11-23 DIAGNOSIS — N200 Calculus of kidney: Secondary | ICD-10-CM | POA: Diagnosis not present

## 2021-11-23 DIAGNOSIS — J9601 Acute respiratory failure with hypoxia: Secondary | ICD-10-CM | POA: Diagnosis present

## 2021-11-23 DIAGNOSIS — I129 Hypertensive chronic kidney disease with stage 1 through stage 4 chronic kidney disease, or unspecified chronic kidney disease: Secondary | ICD-10-CM | POA: Diagnosis not present

## 2021-11-23 DIAGNOSIS — Z20822 Contact with and (suspected) exposure to covid-19: Secondary | ICD-10-CM | POA: Diagnosis not present

## 2021-11-23 DIAGNOSIS — Z66 Do not resuscitate: Secondary | ICD-10-CM | POA: Diagnosis present

## 2021-11-23 DIAGNOSIS — G894 Chronic pain syndrome: Secondary | ICD-10-CM | POA: Diagnosis not present

## 2021-11-23 DIAGNOSIS — I10 Essential (primary) hypertension: Secondary | ICD-10-CM | POA: Diagnosis not present

## 2021-11-23 DIAGNOSIS — R079 Chest pain, unspecified: Secondary | ICD-10-CM | POA: Diagnosis not present

## 2021-11-23 DIAGNOSIS — F32A Depression, unspecified: Secondary | ICD-10-CM | POA: Diagnosis present

## 2021-11-23 DIAGNOSIS — K219 Gastro-esophageal reflux disease without esophagitis: Secondary | ICD-10-CM | POA: Diagnosis present

## 2021-11-23 DIAGNOSIS — I2693 Single subsegmental pulmonary embolism without acute cor pulmonale: Secondary | ICD-10-CM | POA: Diagnosis not present

## 2021-11-23 DIAGNOSIS — D509 Iron deficiency anemia, unspecified: Secondary | ICD-10-CM | POA: Diagnosis present

## 2021-11-23 DIAGNOSIS — I2699 Other pulmonary embolism without acute cor pulmonale: Secondary | ICD-10-CM

## 2021-11-23 DIAGNOSIS — R918 Other nonspecific abnormal finding of lung field: Secondary | ICD-10-CM | POA: Diagnosis present

## 2021-11-23 DIAGNOSIS — Z880 Allergy status to penicillin: Secondary | ICD-10-CM

## 2021-11-23 DIAGNOSIS — M25519 Pain in unspecified shoulder: Secondary | ICD-10-CM

## 2021-11-23 DIAGNOSIS — Z882 Allergy status to sulfonamides status: Secondary | ICD-10-CM

## 2021-11-23 DIAGNOSIS — N1832 Chronic kidney disease, stage 3b: Secondary | ICD-10-CM | POA: Diagnosis present

## 2021-11-23 DIAGNOSIS — G500 Trigeminal neuralgia: Secondary | ICD-10-CM | POA: Diagnosis present

## 2021-11-23 DIAGNOSIS — Z96653 Presence of artificial knee joint, bilateral: Secondary | ICD-10-CM | POA: Diagnosis present

## 2021-11-23 DIAGNOSIS — Z888 Allergy status to other drugs, medicaments and biological substances status: Secondary | ICD-10-CM

## 2021-11-23 DIAGNOSIS — E785 Hyperlipidemia, unspecified: Secondary | ICD-10-CM | POA: Diagnosis present

## 2021-11-23 HISTORY — DX: Epilepsy, unspecified, not intractable, without status epilepticus: G40.909

## 2021-11-23 HISTORY — DX: Essential (primary) hypertension: I10

## 2021-11-23 HISTORY — DX: Chronic pain syndrome: G89.4

## 2021-11-23 HISTORY — DX: Depression, unspecified: F32.A

## 2021-11-23 HISTORY — DX: Hyperlipidemia, unspecified: E78.5

## 2021-11-23 HISTORY — DX: Unspecified osteoarthritis, unspecified site: M19.90

## 2021-11-23 LAB — CBC WITH DIFFERENTIAL/PLATELET
Abs Immature Granulocytes: 0.04 10*3/uL (ref 0.00–0.07)
Basophils Absolute: 0.1 10*3/uL (ref 0.0–0.1)
Basophils Relative: 1 %
Eosinophils Absolute: 0.1 10*3/uL (ref 0.0–0.5)
Eosinophils Relative: 1 %
HCT: 37.2 % (ref 36.0–46.0)
Hemoglobin: 12.2 g/dL (ref 12.0–15.0)
Immature Granulocytes: 0 %
Lymphocytes Relative: 18 %
Lymphs Abs: 1.8 10*3/uL (ref 0.7–4.0)
MCH: 31.6 pg (ref 26.0–34.0)
MCHC: 32.8 g/dL (ref 30.0–36.0)
MCV: 96.4 fL (ref 80.0–100.0)
Monocytes Absolute: 0.9 10*3/uL (ref 0.1–1.0)
Monocytes Relative: 9 %
Neutro Abs: 7 10*3/uL (ref 1.7–7.7)
Neutrophils Relative %: 71 %
Platelets: 196 10*3/uL (ref 150–400)
RBC: 3.86 MIL/uL — ABNORMAL LOW (ref 3.87–5.11)
RDW: 12.1 % (ref 11.5–15.5)
WBC: 9.9 10*3/uL (ref 4.0–10.5)
nRBC: 0 % (ref 0.0–0.2)

## 2021-11-23 LAB — LIPASE, BLOOD: Lipase: 27 U/L (ref 11–51)

## 2021-11-23 LAB — I-STAT CHEM 8, ED
BUN: 19 mg/dL (ref 8–23)
Calcium, Ion: 1.06 mmol/L — ABNORMAL LOW (ref 1.15–1.40)
Chloride: 99 mmol/L (ref 98–111)
Creatinine, Ser: 1.4 mg/dL — ABNORMAL HIGH (ref 0.44–1.00)
Glucose, Bld: 108 mg/dL — ABNORMAL HIGH (ref 70–99)
HCT: 38 % (ref 36.0–46.0)
Hemoglobin: 12.9 g/dL (ref 12.0–15.0)
Potassium: 4.5 mmol/L (ref 3.5–5.1)
Sodium: 136 mmol/L (ref 135–145)
TCO2: 30 mmol/L (ref 22–32)

## 2021-11-23 LAB — COMPREHENSIVE METABOLIC PANEL
ALT: 10 U/L (ref 0–44)
AST: 19 U/L (ref 15–41)
Albumin: 3.8 g/dL (ref 3.5–5.0)
Alkaline Phosphatase: 79 U/L (ref 38–126)
Anion gap: 9 (ref 5–15)
BUN: 16 mg/dL (ref 8–23)
CO2: 27 mmol/L (ref 22–32)
Calcium: 8.3 mg/dL — ABNORMAL LOW (ref 8.9–10.3)
Chloride: 99 mmol/L (ref 98–111)
Creatinine, Ser: 1.39 mg/dL — ABNORMAL HIGH (ref 0.44–1.00)
GFR, Estimated: 37 mL/min — ABNORMAL LOW (ref >60)
Glucose, Bld: 116 mg/dL — ABNORMAL HIGH (ref 70–99)
Potassium: 4.5 mmol/L (ref 3.5–5.1)
Sodium: 135 mmol/L (ref 135–145)
Total Bilirubin: 1.1 mg/dL (ref 0.3–1.2)
Total Protein: 6.4 g/dL — ABNORMAL LOW (ref 6.5–8.1)

## 2021-11-23 LAB — TROPONIN I (HIGH SENSITIVITY): Troponin I (High Sensitivity): 7 ng/L (ref <18)

## 2021-11-23 LAB — RESP PANEL BY RT-PCR (FLU A&B, COVID) ARPGX2
Influenza A by PCR: NEGATIVE
Influenza B by PCR: NEGATIVE
SARS Coronavirus 2 by RT PCR: NEGATIVE

## 2021-11-23 LAB — PROTIME-INR
INR: 1.1 (ref 0.8–1.2)
Prothrombin Time: 13.8 seconds (ref 11.4–15.2)

## 2021-11-23 MED ORDER — LACTATED RINGERS IV SOLN: INTRAVENOUS | Status: AC

## 2021-11-23 MED ORDER — DULOXETINE HCL 30 MG PO CPEP: 30.0000 mg | ORAL_CAPSULE | Freq: Every day | ORAL | Status: DC

## 2021-11-23 MED ORDER — NALOXONE HCL 0.4 MG/ML IJ SOLN: 0.4000 mg | INTRAMUSCULAR | Status: DC | PRN

## 2021-11-23 MED ORDER — MORPHINE SULFATE (PF) 4 MG/ML IV SOLN
4.0000 mg | Freq: Once | INTRAVENOUS | Status: AC
  Administered 2021-11-23: 4 mg via INTRAVENOUS
  Filled 2021-11-23: qty 1

## 2021-11-23 MED ORDER — ONDANSETRON HCL 4 MG/2ML IJ SOLN: 4.0000 mg | Freq: Four times a day (QID) | INTRAMUSCULAR | Status: DC | PRN

## 2021-11-23 MED ORDER — ATORVASTATIN CALCIUM 10 MG PO TABS
20.0000 mg | ORAL_TABLET | Freq: Every day | ORAL | Status: DC
  Administered 2021-11-24 – 2021-11-26 (×3): 20 mg via ORAL
  Filled 2021-11-23 (×3): qty 2

## 2021-11-23 MED ORDER — TRAZODONE HCL 50 MG PO TABS
100.0000 mg | ORAL_TABLET | Freq: Every day | ORAL | Status: DC
Start: 2021-11-23 — End: 2021-11-27
  Administered 2021-11-23 – 2021-11-25 (×3): 100 mg via ORAL
  Filled 2021-11-23 (×3): qty 2

## 2021-11-23 MED ORDER — ONDANSETRON HCL 4 MG/2ML IJ SOLN
4.0000 mg | Freq: Once | INTRAMUSCULAR | Status: AC
  Administered 2021-11-23: 4 mg via INTRAVENOUS
  Filled 2021-11-23: qty 2

## 2021-11-23 MED ORDER — ONDANSETRON HCL 4 MG PO TABS: 4.0000 mg | ORAL_TABLET | Freq: Four times a day (QID) | ORAL | Status: DC | PRN

## 2021-11-23 MED ORDER — HEPARIN BOLUS VIA INFUSION
4000.0000 [IU] | Freq: Once | INTRAVENOUS | Status: AC
Start: 2021-11-23 — End: 2021-11-23
  Administered 2021-11-23: 4000 [IU] via INTRAVENOUS
  Filled 2021-11-23: qty 4000

## 2021-11-23 MED ORDER — ACETAMINOPHEN 650 MG RE SUPP: 650.0000 mg | Freq: Four times a day (QID) | RECTAL | Status: DC | PRN

## 2021-11-23 MED ORDER — OXYCODONE HCL 5 MG PO TABS
5.0000 mg | ORAL_TABLET | ORAL | Status: DC | PRN
  Administered 2021-11-23 – 2021-11-26 (×4): 5 mg via ORAL
  Filled 2021-11-23 (×4): qty 1

## 2021-11-23 MED ORDER — DOCUSATE SODIUM 100 MG PO CAPS
100.0000 mg | ORAL_CAPSULE | Freq: Two times a day (BID) | ORAL | Status: DC
  Administered 2021-11-23 – 2021-11-26 (×6): 100 mg via ORAL
  Filled 2021-11-23 (×6): qty 1

## 2021-11-23 MED ORDER — DICLOFENAC SODIUM 1 % EX GEL
2.0000 g | Freq: Every day | CUTANEOUS | Status: DC
  Administered 2021-11-23 – 2021-11-26 (×3): 2 g via TOPICAL
  Filled 2021-11-23: qty 100

## 2021-11-23 MED ORDER — HEPARIN (PORCINE) 25000 UT/250ML-% IV SOLN
950.0000 [IU]/h | INTRAVENOUS | Status: DC
  Administered 2021-11-23: 16:00:00 1200 [IU]/h via INTRAVENOUS
  Administered 2021-11-24 – 2021-11-25 (×2): 1100 [IU]/h via INTRAVENOUS
  Filled 2021-11-23 (×3): qty 250

## 2021-11-23 MED ORDER — HYDRALAZINE HCL 20 MG/ML IJ SOLN: 5.0000 mg | INTRAMUSCULAR | Status: DC | PRN

## 2021-11-23 MED ORDER — NEBIVOLOL HCL 5 MG PO TABS
5.0000 mg | ORAL_TABLET | Freq: Every day | ORAL | Status: DC
  Administered 2021-11-24 – 2021-11-26 (×3): 5 mg via ORAL
  Filled 2021-11-23 (×4): qty 1

## 2021-11-23 MED ORDER — DULOXETINE HCL 30 MG PO CPEP
30.0000 mg | ORAL_CAPSULE | Freq: Two times a day (BID) | ORAL | Status: DC
  Administered 2021-11-23 – 2021-11-26 (×6): 30 mg via ORAL
  Filled 2021-11-23 (×6): qty 1

## 2021-11-23 MED ORDER — BISACODYL 5 MG PO TBEC: 5.0000 mg | DELAYED_RELEASE_TABLET | Freq: Every day | ORAL | Status: DC | PRN

## 2021-11-23 MED ORDER — HYDROMORPHONE HCL 2 MG PO TABS
2.0000 mg | ORAL_TABLET | Freq: Three times a day (TID) | ORAL | Status: DC | PRN
  Administered 2021-11-23 – 2021-11-26 (×4): 2 mg via ORAL
  Filled 2021-11-23 (×4): qty 1

## 2021-11-23 MED ORDER — GABAPENTIN 300 MG PO CAPS
300.0000 mg | ORAL_CAPSULE | Freq: Every day | ORAL | Status: DC
  Administered 2021-11-24 – 2021-11-26 (×3): 300 mg via ORAL
  Filled 2021-11-23 (×3): qty 1

## 2021-11-23 MED ORDER — ALBUTEROL SULFATE (2.5 MG/3ML) 0.083% IN NEBU: 2.5000 mg | INHALATION_SOLUTION | RESPIRATORY_TRACT | Status: DC | PRN

## 2021-11-23 MED ORDER — DULOXETINE HCL 60 MG PO CPEP: 60.0000 mg | ORAL_CAPSULE | Freq: Every day | ORAL | Status: DC

## 2021-11-23 MED ORDER — SODIUM CHLORIDE 0.9% FLUSH
3.0000 mL | Freq: Two times a day (BID) | INTRAVENOUS | Status: DC
  Administered 2021-11-24 – 2021-11-26 (×3): 3 mL via INTRAVENOUS

## 2021-11-23 MED ORDER — PANTOPRAZOLE SODIUM 40 MG PO TBEC
40.0000 mg | DELAYED_RELEASE_TABLET | Freq: Every day | ORAL | Status: DC
  Administered 2021-11-24 – 2021-11-26 (×3): 40 mg via ORAL
  Filled 2021-11-23 (×3): qty 1

## 2021-11-23 MED ORDER — HYDROMORPHONE HCL 1 MG/ML IJ SOLN
0.5000 mg | INTRAMUSCULAR | Status: DC | PRN
  Administered 2021-11-23 – 2021-11-24 (×2): 1 mg via INTRAVENOUS
  Administered 2021-11-24 (×2): 0.5 mg via INTRAVENOUS
  Administered 2021-11-25 – 2021-11-26 (×5): 1 mg via INTRAVENOUS
  Filled 2021-11-23 (×9): qty 1

## 2021-11-23 MED ORDER — ACETAMINOPHEN 325 MG PO TABS
650.0000 mg | ORAL_TABLET | Freq: Four times a day (QID) | ORAL | Status: DC | PRN
  Administered 2021-11-25 – 2021-11-26 (×2): 650 mg via ORAL
  Filled 2021-11-23 (×2): qty 2

## 2021-11-23 MED ORDER — PRIMIDONE 50 MG PO TABS
50.0000 mg | ORAL_TABLET | Freq: Every day | ORAL | Status: DC
  Administered 2021-11-23 – 2021-11-25 (×3): 50 mg via ORAL
  Filled 2021-11-23 (×4): qty 1

## 2021-11-23 MED ORDER — POLYETHYLENE GLYCOL 3350 17 G PO PACK
17.0000 g | PACK | Freq: Every day | ORAL | Status: DC | PRN
  Administered 2021-11-24 – 2021-11-25 (×2): 17 g via ORAL
  Filled 2021-11-23 (×3): qty 1

## 2021-11-23 MED ORDER — KETAMINE HCL 50 MG/5ML IJ SOSY
10.0000 mg | PREFILLED_SYRINGE | Freq: Once | INTRAMUSCULAR | Status: AC
  Administered 2021-11-23: 10 mg via INTRAVENOUS
  Filled 2021-11-23: qty 5

## 2021-11-23 MED ORDER — IOHEXOL 350 MG/ML SOLN
80.0000 mL | Freq: Once | INTRAVENOUS | Status: AC | PRN
Start: 2021-11-23 — End: 2021-11-23
  Administered 2021-11-23: 80 mL via INTRAVENOUS

## 2021-11-23 NOTE — ED Triage Notes (Signed)
Pt from [place]. Complaints of chest pain in the L breast and armpit area, worse with movement and inspiration.This started at 0800 as patient was waking up- she states "I think I slept on this side". 10/10 pain. Denies n/v/d, dizziness, weakness. Pt states as she continued on with her day the pain has gotten worse, and nothing seems to help. VSS.

## 2021-11-23 NOTE — Assessment & Plan Note (Addendum)
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 -No obvious leg symptoms or findings on exam, but this is academic since she will need lifelong AC with this recurrent clot -Most likely etiology is decreased mobility at her facility; encourage increased mobility, PT/OT consults requested -Will observe on telemetry for now -No current O2 requirement -No R heart strain appreciated on CT; will obtain limited echo for further evaluation -Initiate anticoagulation - for now, will start treatment-dose heparin -she is likely able to transition to alternative [facility] agent tomorrow -We discussed oral AC treatment options and risks/benefits including frequent MD visits and dietary regulation with Coumadin vs. Significant expense with DOAC therapy -Will request TOC team consultation to assist with cost analysis of the various DOAC options based on her insurance -Her biggest issue is her fall risk on DOAC therapy and we discussed this at length -The patient understands that thromboembolic disease can be catastrophic and even deadly and that she must be complaint with physician appointments and lifelong anticoagulation given this recurrent event.

## 2021-11-23 NOTE — Progress Notes (Signed)
ANTICOAGULATION CONSULT NOTE - Initial Consult  Pharmacy Consult for Heparin Indication: pulmonary embolus  Allergies  Allergen Reactions   Penicillins Hives and Rash    Other reaction(s): Other unknown    Tetracyclines & Related Anaphylaxis    Other reaction(s): Other unknown    Sulfa Antibiotics Hives and Other (See Comments)    unknown    Patient Measurements: Height: 5\' 4"  (162.6 cm) IBW/kg (Calculated) : 54.7 Heparin Dosing Weight: 66.4 kg  Vital Signs: Temp: 99.5 F (37.5 C) (01/29 1325) Temp Source: Oral (01/29 1325) BP: 190/88 (01/29 1400) Pulse Rate: 82 (01/29 1400)  Labs: Recent Labs    11/23/21 1340 11/23/21 1406  HGB 12.2 12.9  HCT 37.2 38.0  PLT 196  --   CREATININE 1.39* 1.40*  TROPONINIHS 7  --     CrCl cannot be calculated (Unknown ideal weight.).   Medical History: History reviewed. No pertinent past medical history.  Medications:  (Not in a hospital admission)  Scheduled:  Infusions:  PRN:   Assessment: 68 yof presenting with left-sided chest discomfort. Heparin per pharmacy consult placed for pulmonary embolus.  Patient is not on anticoagulation prior to arrival.  Hgb 12.9; plt 196  Goal of Therapy:  Heparin level 0.3-0.7 units/ml Monitor platelets by anticoagulation protocol: Yes   Plan:  Give 4000 units bolus x 1 Start heparin infusion at 1200 units/hr Check anti-Xa level in 8 hours and daily while on heparin Continue to monitor H&H and platelets  88, PharmD, BCPS 11/23/2021 3:18 PM ED Clinical Pharmacist -  [phone]

## 2021-11-23 NOTE — ED Provider Notes (Signed)
[facility] EMERGENCY DEPARTMENT Provider Note   CSN: [id number] Arrival date & time: 11/23/21  1312     History  Chief Complaint  Patient presents with   Chest Pain    Kristen [name] is a 88 y.o. female.  88 yo F with a chief complaints of left-sided chest discomfort.  This is described as sharp and severe worse with every time she breathes moves or twists.  She denies trauma to the area denies cough congestion or fever.  Denies having pain like this before.  Denies abdominal discomfort.   Chest Pain     Home Medications Prior to Admission medications   Medication Sig Start Date End Date Taking? Authorizing Provider  atorvastatin (LIPITOR) 20 MG tablet Take 20 mg by mouth daily. 09/26/21   [provider]  cyanocobalamin (,VITAMIN B-12,) 1000 MCG/ML injection Inject into the muscle. 11/02/19   [provider]  DULoxetine (CYMBALTA) 30 MG capsule Take 30 mg by mouth daily. 06/05/21   [provider]  DULoxetine (CYMBALTA) 60 MG capsule Take 60 mg by mouth daily. 09/26/21   [provider]  esomeprazole (NEXIUM) 40 MG capsule Take 40 mg by mouth daily. 09/26/21   [provider]  gabapentin (NEURONTIN) 300 MG capsule Take 300 mg by mouth daily. 08/29/21   [provider]  HYDROmorphone (DILAUDID) 2 MG tablet Take 2 mg by mouth 4 (four) times daily. 10/03/21   [provider]  losartan (COZAAR) 50 MG tablet Take 50 mg by mouth daily. 07/28/21   [provider]  meloxicam (MOBIC) 15 MG tablet Take by mouth.    [provider]  nebivolol (BYSTOLIC) 5 MG tablet Take 5 mg by mouth daily.    [provider]  primidone (MYSOLINE) 50 MG tablet Take 50 mg by mouth at bedtime. 08/26/21   [provider]  traZODone (DESYREL) 100 MG tablet Take 100 mg by mouth at bedtime. 08/21/21   [provider]      Allergies    Penicillins, Tetracyclines & related, and Sulfa antibiotics     Review of Systems   Review of Systems  Cardiovascular:  Positive for chest pain.   Physical Exam Updated Vital Signs BP (!) 190/88    Pulse 82    Temp 99.5 F (37.5 C) (Oral)    Resp 19    Ht 5\' 4"  (1.626 m)    LMP  (LMP Unknown)    SpO2 92%    BMI 25.06 kg/m  Physical Exam Vitals and nursing note reviewed.  Constitutional:      General: She is not in acute distress.    Appearance: She is well-developed. She is not diaphoretic.  HENT:     Head: Normocephalic and atraumatic.  Eyes:     Pupils: Pupils are equal, round, and reactive to light.  Cardiovascular:     Rate and Rhythm: Normal rate and regular rhythm.     Heart sounds: No murmur heard.   No friction rub. No gallop.  Pulmonary:     Effort: Pulmonary effort is normal.     Breath sounds: No wheezing or rales.  Chest:     Chest wall: Tenderness present.     Comments: Palpation of the left anterior chest wall about rib 4 through 6 about the medial clavicular line reproduces her discomfort. Abdominal:     General: There is no distension.     Palpations: Abdomen is soft.     Tenderness: There is no abdominal  tenderness.  Musculoskeletal:        General: No tenderness.     Cervical back: Normal range of motion and neck supple.  Skin:    General: Skin is warm and dry.  Neurological:     Mental Status: She is alert and oriented to person, place, and time.  Psychiatric:        Behavior: Behavior normal.    ED Results / Procedures / Treatments   Labs (all labs ordered are listed, but only abnormal results are displayed) Labs Reviewed  CBC WITH DIFFERENTIAL/PLATELET - Abnormal; Notable for the following components:      Result Value   RBC 3.86 (*)    All other components within normal limits  COMPREHENSIVE METABOLIC PANEL - Abnormal; Notable for the following components:   Glucose, Bld 116 (*)    Creatinine, Ser 1.39 (*)    Calcium 8.3 (*)    Total Protein 6.4 (*)    GFR, Estimated 37 (*)    All other components  within normal limits  I-STAT CHEM 8, ED - Abnormal; Notable for the following components:   Creatinine, Ser 1.40 (*)    Glucose, Bld 108 (*)    Calcium, Ion 1.06 (*)    All other components within normal limits  RESP PANEL BY RT-PCR (FLU A&B, COVID) ARPGX2  LIPASE, BLOOD  TROPONIN I (HIGH SENSITIVITY)    EKG EKG Interpretation  Date/Time:  Sunday November 23 2021 13:25:12 EST Ventricular Rate:  70 PR Interval:  170 QRS Duration: 98 QT Interval:  390 QTC Calculation: 421 R Axis:   19 Text Interpretation: Sinus rhythm Probable anterior infarct, old No significant change was found Confirmed by Deno Etienne [phone]) on 11/23/2021 1:26:55 PM  Radiology DG Chest Port 1 View  Result Date: 11/23/2021 CLINICAL DATA:  Chest pain. EXAM: PORTABLE CHEST 1 VIEW COMPARISON:  None. FINDINGS: The heart size and mediastinal contours are within normal limits. Right lung is clear. Elevated left hemidiaphragm is noted with minimal left basilar atelectasis. The visualized skeletal structures are unremarkable. IMPRESSION: Elevated left hemidiaphragm is noted with minimal left basilar atelectasis. Electronically Signed   By: Marijo Conception M.D.   On: 11/23/2021 14:15    Procedures Procedures    Medications Ordered in ED Medications  morphine 4 MG/ML injection 4 mg (4 mg Intravenous Given 11/23/21 1338)  ondansetron (ZOFRAN) injection 4 mg (4 mg Intravenous Given 11/23/21 1339)  ketamine 50 mg in normal saline 5 mL (10 mg/mL) syringe (10 mg Intravenous Given 11/23/21 1505)  iohexol (OMNIPAQUE) 350 MG/ML injection 80 mL (80 mLs Intravenous Contrast Given 11/23/21 1457)    ED Course/ Medical Decision Making/ A&P                           Medical Decision Making Amount and/or Complexity of Data Reviewed Labs: ordered. Radiology: ordered.  Risk Prescription drug management.   Patient is a 88 y.o. female with a cc of left-sided chest discomfort.  This started this morning is worse with deep breathing  moving palpation.  Most likely musculoskeletal by history however the patient is very uncomfortable and is significantly hypertensive rolling around on the bed and uncontrolled discomfort.  Hx of hypertension. Some concern for dissection.  We will obtain a CT angiogram of the chest abdomen pelvis.  Chest x-ray blood work treat pain and nausea.  Reassess.  I reviewed the patients chart and she had a recent nuclear stress test within the  last year that was low risk.  Her PDMP was reviewed and the patient is chronically on 2 mg of oral Dilaudid.  No significant improvement with 4 mg of IV morphine.  With the patient chronically on narcotics it may be difficult to control her pain with the same.  We will give a dose of ketamine.  I independently interpreted the patients labs and imaging chest x-ray without focal infiltrate or pneumothorax.  No significant anemia no significant electrolyte abnormality.  Troponin negative.  I received a call from the radiologist about the patient's dissection study and discussed it with them.  They did not see a dissection but the patient does have pulmonary embolism.  No signs of right heart strain.  Based on the patient's age alone her Pesi score is an indeterminate risk.  She still fairly uncomfortable on repeat exam.  Will start on heparin.  Will discuss with hospitalist for admission.  CRITICAL CARE Performed by: Cecilio Asper   Total critical care time: 35 minutes  Critical care time was exclusive of separately billable procedures and treating other patients.  Critical care was necessary to treat or prevent imminent or life-threatening deterioration.  Critical care was time spent personally by me on the following activities: development of treatment plan with patient and/or surrogate as well as nursing, discussions with consultants, evaluation of patient's response to treatment, examination of patient, obtaining history from patient or surrogate, ordering and  performing treatments and interventions, ordering and review of laboratory studies, ordering and review of radiographic studies, pulse oximetry and re-evaluation of patient's condition.  The patients results and plan were reviewed and discussed.   Any x-rays performed were independently reviewed by myself.   Differential diagnosis were considered with the presenting HPI.  Medications  morphine 4 MG/ML injection 4 mg (4 mg Intravenous Given 11/23/21 1338)  ondansetron (ZOFRAN) injection 4 mg (4 mg Intravenous Given 11/23/21 1339)  ketamine 50 mg in normal saline 5 mL (10 mg/mL) syringe (10 mg Intravenous Given 11/23/21 1505)  iohexol (OMNIPAQUE) 350 MG/ML injection 80 mL (80 mLs Intravenous Contrast Given 11/23/21 1457)    Vitals:   11/23/21 1324 11/23/21 1325 11/23/21 1400  BP:  (!) 209/65 (!) 190/88  Pulse: 74 (!) 26 82  Resp: 20 (!) 22 19  Temp:  99.5 F (37.5 C)   TempSrc:  Oral   SpO2:  96% 92%  Height:  5\' 4"  (1.626 m)     Final diagnoses:  Single subsegmental pulmonary embolism without acute cor pulmonale (HCC)    Admission/ observation were discussed with the admitting physician, patient and/or family and they are comfortable with the plan.         Final Clinical Impression(s) / ED Diagnoses Final diagnoses:  Single subsegmental pulmonary embolism without acute cor pulmonale [facility])    Rx / DC Orders ED Discharge Orders     None         Deno Etienne, DO 11/23/21 1518

## 2021-11-23 NOTE — Assessment & Plan Note (Signed)
-  Continue Lipitor °

## 2021-11-23 NOTE — Assessment & Plan Note (Signed)
-  I have discussed code status with the patient and her daughter and  they are in agreement that the patient would not desire resuscitation and would prefer to die a natural death should that situation arise. ?-She will need a gold out of facility DNR form at the time of discharge ?

## 2021-11-23 NOTE — Assessment & Plan Note (Signed)
-  Needs outpatient pulmonology f/u -She does not have a smoking history

## 2021-11-23 NOTE — Progress Notes (Signed)
Received patient from ED on 12000 units of heparin and O2 94% on room air. Patient with daughter at bedside, resting comfortably.

## 2021-11-23 NOTE — ED Notes (Signed)
Pt ambulated to the RR and back to room pt did well

## 2021-11-23 NOTE — Assessment & Plan Note (Addendum)
-  She has had long-standing pain from trigeminal neuralgia and is on impressive medications for this issue. -I have reviewed this patient in the [place] Controlled Substances Reporting System.  She is receiving medications from only one provider and appears to be taking them as prescribed. -She is at high risk of opioid misuse, diversion, or overdose. -Continue Trazodone, PO Dilaudid, Neurontin, and Cymbalta -Will add prn IV Dilaudid since she likely has increased pain sensitivity -Will order prn Narcan

## 2021-11-23 NOTE — Assessment & Plan Note (Signed)
-  Continue Nebivolol -Hold Cozaar due to renal dysfunction -Will add prn IV hydralazine

## 2021-11-23 NOTE — H&P (Signed)
History and Physical    Patient: Kristen [name] [id number] DOB: 01/12/1933 DOA: 11/23/2021 DOS: the patient was seen and examined on 11/23/2021 PCP: Kathalene Frames, MD  Patient coming from: ALF/ILF - [facility]; NOK: Daughter, Miles Costain, [phone]   Chief Complaint: CP  HPI: Kristen [name] is a 88 y.o. female with medical history significant of chronic pain; seizure d/o; depression; HTN; and HLD presenting with left-sided CP.  She reports that she developed horrible pain in her L chest near her breast and around to her back.  The pain started this AM when she got up and worsened.  About noon, she was "about the croak."  No h/o DVT post-operatively in the past.  She is less mobile recently, trying to avoid COVID exposure.  She goes to PT 2x/week, rose the bike and did balance exercises.  No leg symptoms.  +SOB, hurt too much to breathe.  No cough.  No sick contacts.  No recent blood thinners.  Her pain is better but still present.  Lasix has been on hold for 2-3 months due to concerns about lab abnormalities.    ER Course:  Chronic pain with CP.  Dissection study shows PE.  Intractable pain, mild O2 requirement after opioids, ketamine.     Review of Systems: As mentioned in the history of present illness. All other systems reviewed and are negative. Past Medical History:  Diagnosis Date   Arthritis    Chronic pain disorder    trigeminal neuralgia   Depression    Dyslipidemia    Hypertension    Seizure disorder ([place])    decades since last seizure   Past Surgical History:  Procedure Laterality Date   TOTAL KNEE ARTHROPLASTY Bilateral    Social History:  reports that she has never smoked. She has never been exposed to tobacco smoke. She has never used smokeless tobacco. She reports that she does not currently use alcohol. She reports that she does not use drugs.  Allergies  Allergen Reactions   Penicillins Hives and Rash    Other reaction(s): Other unknown     Tetracyclines & Related Anaphylaxis    Other reaction(s): Other unknown    Sulfa Antibiotics Hives and Other (See Comments)    unknown    History reviewed. No pertinent family history.  Prior to Admission medications   Medication Sig Start Date End Date Taking? Authorizing Provider  atorvastatin (LIPITOR) 20 MG tablet Take 20 mg by mouth daily. 09/26/21   [provider]  cyanocobalamin (,VITAMIN B-12,) 1000 MCG/ML injection Inject into the muscle. 11/02/19   [provider]  DULoxetine (CYMBALTA) 30 MG capsule Take 30 mg by mouth daily. 06/05/21   [provider]  DULoxetine (CYMBALTA) 60 MG capsule Take 60 mg by mouth daily. 09/26/21   [provider]  esomeprazole (NEXIUM) 40 MG capsule Take 40 mg by mouth daily. 09/26/21   [provider]  gabapentin (NEURONTIN) 300 MG capsule Take 300 mg by mouth daily. 08/29/21   [provider]  HYDROmorphone (DILAUDID) 2 MG tablet Take 2 mg by mouth 4 (four) times daily. 10/03/21   [provider]  losartan (COZAAR) 50 MG tablet Take 50 mg by mouth daily. 07/28/21   [provider]  meloxicam (MOBIC) 15 MG tablet Take by mouth.    [provider]  nebivolol (BYSTOLIC) 5 MG tablet Take 5 mg by mouth daily.    [provider]  primidone (MYSOLINE) 50 MG tablet Take 50 mg by mouth  at bedtime. 08/26/21   [provider]  traZODone (DESYREL) 100 MG tablet Take 100 mg by mouth at bedtime. 08/21/21   [provider]    Physical Exam: Vitals:   11/23/21 1600 11/23/21 1630 11/23/21 1727 11/23/21 1817  BP: (!) 191/71 (!) 183/59 (!) 169/74 (!) 156/68  Pulse: 95 99 93 91  Resp: (!) 21 (!) 25 20 18   Temp:   100 F (37.8 C)   TempSrc:   Oral   SpO2: 100% 91% 94% 100%  Weight:      Height:       General:  Appears calm and comfortable and is in NAD Eyes:  PERRL, EOMI, normal lids, iris ENT:  grossly normal hearing, lips & tongue, mmm Neck:  no LAD,  masses or thyromegaly Cardiovascular:  RRR, no m/r/g. No LE edema.  Respiratory:   CTA bilaterally with no wheezes/rales/rhonchi.  Mildly increased respiratory effort. Abdomen:  soft, NT, ND Skin:  no rash or induration seen on limited exam Musculoskeletal:  grossly normal tone BUE/BLE, good ROM, no bony abnormality Psychiatric:  grossly normal mood and affect, speech fluent and appropriate, AOx3 Neurologic:  CN 2-12 grossly intact, moves all extremities in coordinated fashion   Radiological Exams on Admission: Independently reviewed - see discussion in A/P where applicable  DG Chest Port 1 View  Result Date: 11/23/2021 CLINICAL DATA:  Chest pain. EXAM: PORTABLE CHEST 1 VIEW COMPARISON:  None. FINDINGS: The heart size and mediastinal contours are within normal limits. Right lung is clear. Elevated left hemidiaphragm is noted with minimal left basilar atelectasis. The visualized skeletal structures are unremarkable. IMPRESSION: Elevated left hemidiaphragm is noted with minimal left basilar atelectasis. Electronically Signed   By: Marijo Conception M.D.   On: 11/23/2021 14:15   CT Angio Chest/Abd/Pel for Dissection W and/or Wo Contrast  Result Date: 11/23/2021 CLINICAL DATA:  Chest pain or back pain, aortic dissection suspected EXAM: CT ANGIOGRAPHY CHEST, ABDOMEN AND PELVIS TECHNIQUE: Non-contrast CT of the chest was initially obtained. Multidetector CT imaging through the chest, abdomen and pelvis was performed using the standard protocol during bolus administration of intravenous contrast. Multiplanar reconstructed images and MIPs were obtained and reviewed to evaluate the vascular anatomy. RADIATION DOSE REDUCTION: This exam was performed according to the departmental dose-optimization program which includes automated exposure control, adjustment of the mA and/or kV according to patient size and/or use of iterative reconstruction technique. CONTRAST:  5mL OMNIPAQUE IOHEXOL 350 MG/ML SOLN  COMPARISON:  Chest x-ray 11/23/2021 FINDINGS: CTA CHEST FINDINGS Cardiovascular: Satisfactory opacification of the pulmonary arteries to the segmental level. Nonocclusive distal central left pulmonary embolus extending to the left upper and left lower segmental levels. Normal heart size. No significant pericardial effusion. The thoracic aorta is normal in caliber. No definite aortic dissection. Mild atherosclerotic plaque of the thoracic aorta. At least 2 vessel coronary artery calcifications. Mediastinum/Nodes: No enlarged mediastinal, hilar, or axillary lymph nodes. Thyroid gland, trachea, and esophagus demonstrate no significant findings. Elevated left hemidiaphragm. Lungs/Pleura: Right upper lobe centrally solid and peripherally ground-glass airspace opacity with the solid portions appearing nodular-like measuring 2.2 x 2.8 cm (8:28). Subpleural left upper lobe pulmonary micronodule (8:29). Subpleural left apical calcified pulmonary micronodule (8:14). Pulmonary micronodule within the right middle lobe (8:55). No pulmonary mass. No pleural effusion. No pneumothorax. Musculoskeletal: No chest wall abnormality. No suspicious lytic or blastic osseous lesions. No acute displaced fracture. Old healed left rib fractures. Multilevel degenerative changes of the spine. T11 vertebral body hemangioma. Review of the MIP  images confirms the above findings. CTA ABDOMEN AND PELVIS FINDINGS VASCULAR Aorta: Severe atherosclerotic plaque. Normal caliber aorta without aneurysm, dissection, vasculitis or significant stenosis. Celiac: Patent without evidence of aneurysm, dissection, vasculitis or significant stenosis. SMA: Patent without evidence of aneurysm, dissection, vasculitis or significant stenosis. Renals: Mild to moderate atherosclerotic plaque at the origin of the renal arteries. Both renal arteries are patent without evidence of aneurysm, dissection, vasculitis, fibromuscular dysplasia or significant stenosis. IMA: Patent  without evidence of aneurysm, dissection, vasculitis or significant stenosis. Inflow: Patent without evidence of aneurysm, dissection, vasculitis or significant stenosis. Veins: No obvious venous abnormality within the limitations of this arterial phase study. Review of the MIP images confirms the above findings. NON-VASCULAR Hepatobiliary: No focal liver abnormality. Status post cholecystectomy. No biliary dilatation. Pancreas: No focal lesion. Normal pancreatic contour. No surrounding inflammatory changes. No main pancreatic ductal dilatation. Spleen: Normal in size without focal abnormality. Adrenals/Urinary Tract: No adrenal nodule bilaterally. Bilateral kidneys enhance symmetrically. Subcentimeter hypodensities are too small to characterize. Couple of fluid density lesions within bilateral kidneys likely represent simple renal cysts. Couple of calcifications adjacent to a simple renal cyst within the inferior pole of right kidney measuring up to 6 mm. No hydronephrosis. No hydroureter. The urinary bladder is unremarkable. Stomach/Bowel: Stomach is within normal limits. No evidence of bowel wall thickening or dilatation. The appendix is not definitely identified with no inflammatory changes in the right lower quadrant to suggest acute appendicitis. Lymphatic: No lymphadenopathy. Reproductive: Status post hysterectomy. No adnexal masses. Other: No intraperitoneal free fluid. No intraperitoneal free gas. No organized fluid collection. Musculoskeletal: No abdominal wall hernia or abnormality. No suspicious lytic or blastic osseous lesions. No acute displaced fracture. Multilevel degenerative changes of the spine. Grade 1 anterolisthesis of L4 on L5. Review of the MIP images confirms the above findings. IMPRESSION: 1. Nonocclusive distal central left pulmonary embolus extending to the left upper and left lower segmental levels. No heart strain or pulmonary infarction. 2. A 2.2 x 2.8 cm right upper lobe solid and  ground-glass airspace opacity that is nodular-like centrally. Finding could represent infection versus inflammation versus malignancy. Additional imaging evaluation or consultation with Pulmonology or Thoracic Surgery recommended. 3. No acute aortic abnormality. Aortic Atherosclerosis (ICD10-I70.0). 4. Trace left pleural effusion. 5. Nonobstructive right nephrolithiasis measuring up to 6 mm. 6. Other imaging findings of potential clinical significance: Several bilateral scattered pulmonary micronodules within the lungs some calcified and some not. No follow-up needed if patient is low-risk (and has no known or suspected primary neoplasm). Non-contrast chest CT can be considered in 12 months if patient is high-risk. This recommendation follows the consensus statement: Guidelines for Management of Incidental Pulmonary Nodules Detected on CT Images: From the Fleischner Society 2017; Radiology 2017; 284:228-243. These results were called by telephone at the time of interpretation on 11/23/2021 at 3:05 pm to provider DAN FLOYD , who verbally acknowledged these results. Electronically Signed   By: Iven Finn M.D.   On: 11/23/2021 15:22    EKG: Independently reviewed.  NSR with rate 70; no evidence of acute ischemia   Labs on Admission: I have personally reviewed the available labs and imaging studies at the time of the admission.  Pertinent labs:    BUN 16/Creatinine 1.39/GFR 37 - stable based on Care Everywhere records HS troponin 7 Unremarkable CBC   Assessment/Plan * Acute pulmonary embolism without acute cor pulmonale (HCC)- (present on admission) -Patient with 1 prior episode of thromboembolic disease presenting with new PE -No obvious leg symptoms  or findings on exam, but this is academic since she will need lifelong AC with this recurrent clot -Most likely etiology is decreased mobility at her facility; encourage increased mobility, PT/OT consults requested -Will observe on telemetry for  now -No current O2 requirement -No R heart strain appreciated on CT; will obtain limited echo for further evaluation -Initiate anticoagulation - for now, will start treatment-dose heparin -she is likely able to transition to alternative [facility] agent tomorrow -We discussed oral AC treatment options and risks/benefits including frequent MD visits and dietary regulation with Coumadin vs. Significant expense with DOAC therapy -Will request TOC team consultation to assist with cost analysis of the various DOAC options based on her insurance -Her biggest issue is her fall risk on DOAC therapy and we discussed this at length -The patient understands that thromboembolic disease can be catastrophic and even deadly and that she must be complaint with physician appointments and lifelong anticoagulation given this recurrent event.  DNR (do not resuscitate)- (present on admission) -I have discussed code status with the patient and her daughter and  they are in agreement that the patient would not desire resuscitation and would prefer to die a natural death should that situation arise. -She will need a gold out of facility DNR form at the time of discharge  Stage 3b chronic kidney disease (CKD) ([place])- (present on admission) -Appears to be stable -Hold Cozaar for now -Repeat BMP in AM  Chronic pain disorder- (present on admission) -She has had long-standing pain from trigeminal neuralgia and is on impressive medications for this issue. -I have reviewed this patient in the [place] Controlled Substances Reporting System.  She is receiving medications from only one provider and appears to be taking them as prescribed. -She is at high risk of opioid misuse, diversion, or overdose. -Continue Trazodone, PO Dilaudid, Neurontin, and Cymbalta -Will add prn IV Dilaudid since she likely has increased pain sensitivity -Will order prn Narcan  Pulmonary nodule, right- (present on admission) -Needs outpatient pulmonology  f/u -She does not have a smoking history  Essential hypertension- (present on admission) -Continue Nebivolol -Hold Cozaar due to renal dysfunction -Will add prn IV hydralazine  Hyperlipidemia- (present on admission) -Continue Lipitor    Advance Care Planning:   Code Status: DNR   Consults: PT/OT/TOC team  Family Communication: Daughter was present throughout evaluation  Severity of Illness: The appropriate patient status for this patient is OBSERVATION. Observation status is judged to be reasonable and necessary in order to provide the required intensity of service to ensure the patient's safety. The patient's presenting symptoms, physical exam findings, and initial radiographic and laboratory data in the context of their medical condition is felt to place them at decreased risk for further clinical deterioration. Furthermore, it is anticipated that the patient will be medically stable for discharge from the hospital within 2 midnights of admission.   Author: Karmen Bongo, MD 11/23/2021 6:27 PM  For on call review [email or web address].

## 2021-11-23 NOTE — Assessment & Plan Note (Addendum)
-  Appears to be stable -Hold Cozaar for now -Repeat BMP in AM

## 2021-11-24 ENCOUNTER — Other Ambulatory Visit (HOSPITAL_COMMUNITY): Payer: Self-pay

## 2021-11-24 ENCOUNTER — Observation Stay (HOSPITAL_BASED_OUTPATIENT_CLINIC_OR_DEPARTMENT_OTHER): Payer: Medicare Other

## 2021-11-24 DIAGNOSIS — I2699 Other pulmonary embolism without acute cor pulmonale: Secondary | ICD-10-CM | POA: Diagnosis not present

## 2021-11-24 DIAGNOSIS — I2609 Other pulmonary embolism with acute cor pulmonale: Secondary | ICD-10-CM | POA: Diagnosis not present

## 2021-11-24 DIAGNOSIS — I10 Essential (primary) hypertension: Secondary | ICD-10-CM | POA: Diagnosis not present

## 2021-11-24 DIAGNOSIS — Z66 Do not resuscitate: Secondary | ICD-10-CM | POA: Diagnosis not present

## 2021-11-24 DIAGNOSIS — E782 Mixed hyperlipidemia: Secondary | ICD-10-CM

## 2021-11-24 DIAGNOSIS — G894 Chronic pain syndrome: Secondary | ICD-10-CM | POA: Diagnosis not present

## 2021-11-24 DIAGNOSIS — R911 Solitary pulmonary nodule: Secondary | ICD-10-CM | POA: Diagnosis not present

## 2021-11-24 DIAGNOSIS — N1832 Chronic kidney disease, stage 3b: Secondary | ICD-10-CM | POA: Diagnosis not present

## 2021-11-24 LAB — ECHOCARDIOGRAM LIMITED
AR max vel: 1.34 cm2
AV Area VTI: 1.51 cm2
AV Area mean vel: 1.31 cm2
AV Mean grad: 7 mmHg
AV Peak grad: 11 mmHg
Ao pk vel: 1.66 m/s
Calc EF: 66.7 %
Height: 64 in
P 1/2 time: 564 msec
S' Lateral: 2.1 cm
Single Plane A2C EF: 67.1 %
Single Plane A4C EF: 66.8 %
Weight: 2342.17 oz

## 2021-11-24 LAB — BASIC METABOLIC PANEL
Anion gap: 8 (ref 5–15)
BUN: 14 mg/dL (ref 8–23)
CO2: 25 mmol/L (ref 22–32)
Calcium: 8 mg/dL — ABNORMAL LOW (ref 8.9–10.3)
Chloride: 100 mmol/L (ref 98–111)
Creatinine, Ser: 1.19 mg/dL — ABNORMAL HIGH (ref 0.44–1.00)
GFR, Estimated: 45 mL/min — ABNORMAL LOW (ref >60)
Glucose, Bld: 122 mg/dL — ABNORMAL HIGH (ref 70–99)
Potassium: 4.1 mmol/L (ref 3.5–5.1)
Sodium: 133 mmol/L — ABNORMAL LOW (ref 135–145)

## 2021-11-24 LAB — CBC
HCT: 30 % — ABNORMAL LOW (ref 36.0–46.0)
Hemoglobin: 10.2 g/dL — ABNORMAL LOW (ref 12.0–15.0)
MCH: 32.3 pg (ref 26.0–34.0)
MCHC: 34 g/dL (ref 30.0–36.0)
MCV: 94.9 fL (ref 80.0–100.0)
Platelets: 147 10*3/uL — ABNORMAL LOW (ref 150–400)
RBC: 3.16 MIL/uL — ABNORMAL LOW (ref 3.87–5.11)
RDW: 11.9 % (ref 11.5–15.5)
WBC: 10.1 10*3/uL (ref 4.0–10.5)
nRBC: 0 % (ref 0.0–0.2)

## 2021-11-24 LAB — HEPARIN LEVEL (UNFRACTIONATED)
Heparin Unfractionated: 0.71 IU/mL — ABNORMAL HIGH (ref 0.30–0.70)
Heparin Unfractionated: 0.75 IU/mL — ABNORMAL HIGH (ref 0.30–0.70)

## 2021-11-24 NOTE — TOC Benefit Eligibility Note (Addendum)
Patient Advocate Encounter ° °Insurance verification completed.   ° °The patient is currently admitted and upon discharge could be taking Eliquis 5 mg. ° °The current 30 day co-pay is, $40.00.  ° °The patient is currently admitted and upon discharge could be taking Xarelto 20 mg. ° °The current 30 day co-pay is, $40.00.  ° °The patient is insured through [employer] Medicare Part D  ° ° °Jaqueline Uber, CPhT °Pharmacy Patient Advocate Specialist °[facility] Patient Advocate Team °Direct Number: [phone]  Fax: [phone] ° ° ° ° ° °  °

## 2021-11-24 NOTE — Progress Notes (Signed)
OT Cancellation Note  Patient Details Name: Kristen [name] MRN: [id number] DOB: 09-20-1933   Cancelled Treatment:    Reason Eval/Treat Not Completed: Patient not medically ready (acute PE, not on heparin for 24 hrs per protocol, will follow up when appropriate).  Alfonzo Beers, OTD, OTR/L Acute Rehab [phone]   Mayer Masker 11/24/2021, 7:27 AM

## 2021-11-24 NOTE — Progress Notes (Signed)
PT Cancellation Note  Patient Details Name: Kristen [name] MRN: [id number] DOB: Apr 06, 1933   Cancelled Treatment:    Reason Eval/Treat Not Completed: Patient not medically ready (pt with acute PE and not yet on heparin 24hrs per department protocol, MD aware)   [name] 11/24/2021, 7:15 AM [name], PT Acute Rehabilitation Services Pager: [phone] Office: [phone]

## 2021-11-24 NOTE — Progress Notes (Signed)
° °  Echocardiogram 2D Echocardiogram has been performed.  Kristen [name] 11/24/2021, 9:19 AM

## 2021-11-24 NOTE — Progress Notes (Signed)
PROGRESS NOTE    Kristen [name]  [id number] DOB: 1933/04/21 DOA: 11/23/2021 PCP: Kathalene Frames, MD   Brief Narrative:  Kristen [name] is a 88 y.o. female with medical history significant of chronic pain; seizure d/o; depression; HTN; and HLD presenting with left-sided CP.  She reports that she developed horrible pain in her L chest near her breast and around to her back. CTA chest shows PE. Patient admitted for symptom management, need for IV anticoagulation and further evaluation.  Assessment & Plan:   Acute provoked PE with intractable chest pain, acute hypoxic respiratory failure, POA Continue to wean oxygen Ambulation with PT pending protocol (24h of bedrest). Transition to [place] in the next 24-48h pending symptoms management Echo pending  CKD 3b At baseline  Chronic pain Continue home meds  HTN HLD Continue home nebivolol  Incidental pulmonary nodule Outpatient imaging in 3-6 months per protocol  DVT prophylaxis: Heparin gtt Code Status: DNR Family Communication: None available  Status is: Observation  Dispo: The patient is from: ALF              Anticipated d/c is to: Same              Anticipated d/c date is: 48-72h              Patient currently NOT medically stable for discharge  Consultants:  None  Procedures:  None planned  Antimicrobials:  None indicated   Subjective: Pleuritic chest pain ongoing -improved with current regimen  Objective: Vitals:   11/23/21 1600 11/23/21 1630 11/23/21 1727 11/23/21 1817  BP: (!) 191/71 (!) 183/59 (!) 169/74 (!) 156/68  Pulse: 95 99 93 91  Resp: (!) 21 (!) 25 20 18   Temp:   100 F (37.8 C)   TempSrc:   Oral   SpO2: 100% 91% 94% 100%  Weight:      Height:        Intake/Output Summary (Last 24 hours) at 11/24/2021 0808 Last data filed at 11/24/2021 0700 Gross per 24 hour  Intake 720 ml  Output 350 ml  Net 370 ml   Filed Weights   11/23/21 1522  Weight: 66.4 kg    Examination:  General  exam: Appears calm and comfortable  Respiratory system: Clear to auscultation. Respiratory effort normal. Cardiovascular system: S1 & S2 heard, RRR. No JVD, murmurs, rubs, gallops or clicks. No pedal edema. Gastrointestinal system: Abdomen is nondistended, soft and nontender. No organomegaly or masses felt. Normal bowel sounds heard. Central nervous system: Alert and oriented. No focal neurological deficits. Extremities: Symmetric 5 x 5 power. Skin: No rashes, lesions or ulcers Psychiatry: Judgement and insight appear normal. Mood & affect appropriate.     Data Reviewed: I have personally reviewed following labs and imaging studies  CBC: Recent Labs  Lab 11/23/21 1340 11/23/21 1406 11/24/21 0327  WBC 9.9  --  10.1  NEUTROABS 7.0  --   --   HGB 12.2 12.9 10.2*  HCT 37.2 38.0 30.0*  MCV 96.4  --  94.9  PLT 196  --  [id number]*   Basic Metabolic Panel: Recent Labs  Lab 11/23/21 1340 11/23/21 1406 11/24/21 0327  NA 135 136 133*  K 4.5 4.5 4.1  CL 99 99 100  CO2 27  --  25  GLUCOSE 116* 108* 122*  BUN 16 19 14   CREATININE 1.39* 1.40* 1.19*  CALCIUM 8.3*  --  8.0*   GFR: Estimated Creatinine Clearance: 31.8 mL/min (A) (by C-G formula based on SCr  of 1.19 mg/dL (H)). Liver Function Tests: Recent Labs  Lab 11/23/21 1340  AST 19  ALT 10  ALKPHOS 79  BILITOT 1.1  PROT 6.4*  ALBUMIN 3.8   Recent Labs  Lab 11/23/21 1340  LIPASE 27   No results for input(s): AMMONIA in the last 168 hours. Coagulation Profile: Recent Labs  Lab 11/23/21 1535  INR 1.1   Cardiac Enzymes: No results for input(s): CKTOTAL, CKMB, CKMBINDEX, TROPONINI in the last 168 hours. BNP (last 3 results) No results for input(s): PROBNP in the last 8760 hours. HbA1C: No results for input(s): HGBA1C in the last 72 hours. CBG: No results for input(s): GLUCAP in the last 168 hours. Lipid Profile: No results for input(s): CHOL, HDL, LDLCALC, TRIG, CHOLHDL, LDLDIRECT in the last 72 hours. Thyroid  Function Tests: No results for input(s): TSH, T4TOTAL, FREET4, T3FREE, THYROIDAB in the last 72 hours. Anemia Panel: No results for input(s): VITAMINB12, FOLATE, FERRITIN, TIBC, IRON, RETICCTPCT in the last 72 hours. Sepsis Labs: No results for input(s): PROCALCITON, LATICACIDVEN in the last 168 hours.  Recent Results (from the past 240 hour(s))  Resp Panel by RT-PCR (Flu A&B, Covid) Nasopharyngeal Swab     Status: None   Collection Time: 11/23/21  3:16 PM   Specimen: Nasopharyngeal Swab; Nasopharyngeal(NP) swabs in vial transport medium  Result Value Ref Range Status   SARS Coronavirus 2 by RT PCR NEGATIVE NEGATIVE Final    Comment: (NOTE) SARS-CoV-2 target nucleic acids are NOT DETECTED.  The SARS-CoV-2 RNA is generally detectable in upper respiratory specimens during the acute phase of infection. The lowest concentration of SARS-CoV-2 viral copies this assay can detect is 138 copies/mL. A negative result does not preclude SARS-Cov-2 infection and should not be used as the sole basis for treatment or other patient management decisions. A negative result may occur with  improper specimen collection/handling, submission of specimen other than nasopharyngeal swab, presence of viral mutation(s) within the areas targeted by this assay, and inadequate number of viral copies(<138 copies/mL). A negative result must be combined with clinical observations, patient history, and epidemiological information. The expected result is Negative.  Fact Sheet for Patients:  EntrepreneurPulse.com.au  Fact Sheet for Healthcare Providers:  IncredibleEmployment.be  This test is no t yet approved or cleared by the [place] FDA and  has been authorized for detection and/or diagnosis of SARS-CoV-2 by FDA under an Emergency Use Authorization (EUA). This EUA will remain  in effect (meaning this test can be used) for the duration of the COVID-19 declaration under  Section 564(b)(1) of the Act, 21 U.S.C.section 360bbb-3(b)(1), unless the authorization is terminated  or revoked sooner.       Influenza A by PCR NEGATIVE NEGATIVE Final   Influenza B by PCR NEGATIVE NEGATIVE Final    Comment: (NOTE) The Xpert Xpress SARS-CoV-2/FLU/RSV plus assay is intended as an aid in the diagnosis of influenza from Nasopharyngeal swab specimens and should not be used as a sole basis for treatment. Nasal washings and aspirates are unacceptable for Xpert Xpress SARS-CoV-2/FLU/RSV testing.  Fact Sheet for Patients: EntrepreneurPulse.com.au  Fact Sheet for Healthcare Providers: IncredibleEmployment.be  This test is not yet approved or cleared by the [place] FDA and has been authorized for detection and/or diagnosis of SARS-CoV-2 by FDA under an Emergency Use Authorization (EUA). This EUA will remain in effect (meaning this test can be used) for the duration of the COVID-19 declaration under Section 564(b)(1) of the Act, 21 U.S.C. section 360bbb-3(b)(1), unless the authorization is terminated  or revoked.  Performed at [facility], [place] [address] [place], [place] [address]     Radiology Studies: DG Chest Port 1 View  Result Date: 11/23/2021 CLINICAL DATA:  Chest pain. EXAM: PORTABLE CHEST 1 VIEW COMPARISON:  None. FINDINGS: The heart size and mediastinal contours are within normal limits. Right lung is clear. Elevated left hemidiaphragm is noted with minimal left basilar atelectasis. The visualized skeletal structures are unremarkable. IMPRESSION: Elevated left hemidiaphragm is noted with minimal left basilar atelectasis. Electronically Signed   By: Marijo Conception M.D.   On: 11/23/2021 14:15   CT Angio Chest/Abd/Pel for Dissection W and/or Wo Contrast  Result Date: 11/23/2021 CLINICAL DATA:  Chest pain or back pain, aortic dissection suspected EXAM: CT ANGIOGRAPHY CHEST, ABDOMEN AND PELVIS TECHNIQUE:  Non-contrast CT of the chest was initially obtained. Multidetector CT imaging through the chest, abdomen and pelvis was performed using the standard protocol during bolus administration of intravenous contrast. Multiplanar reconstructed images and MIPs were obtained and reviewed to evaluate the vascular anatomy. RADIATION DOSE REDUCTION: This exam was performed according to the departmental dose-optimization program which includes automated exposure control, adjustment of the mA and/or kV according to patient size and/or use of iterative reconstruction technique. CONTRAST:  55mL OMNIPAQUE IOHEXOL 350 MG/ML SOLN COMPARISON:  Chest x-ray 11/23/2021 FINDINGS: CTA CHEST FINDINGS Cardiovascular: Satisfactory opacification of the pulmonary arteries to the segmental level. Nonocclusive distal central left pulmonary embolus extending to the left upper and left lower segmental levels. Normal heart size. No significant pericardial effusion. The thoracic aorta is normal in caliber. No definite aortic dissection. Mild atherosclerotic plaque of the thoracic aorta. At least 2 vessel coronary artery calcifications. Mediastinum/Nodes: No enlarged mediastinal, hilar, or axillary lymph nodes. Thyroid gland, trachea, and esophagus demonstrate no significant findings. Elevated left hemidiaphragm. Lungs/Pleura: Right upper lobe centrally solid and peripherally ground-glass airspace opacity with the solid portions appearing nodular-like measuring 2.2 x 2.8 cm (8:28). Subpleural left upper lobe pulmonary micronodule (8:29). Subpleural left apical calcified pulmonary micronodule (8:14). Pulmonary micronodule within the right middle lobe (8:55). No pulmonary mass. No pleural effusion. No pneumothorax. Musculoskeletal: No chest wall abnormality. No suspicious lytic or blastic osseous lesions. No acute displaced fracture. Old healed left rib fractures. Multilevel degenerative changes of the spine. T11 vertebral body hemangioma. Review of the  MIP images confirms the above findings. CTA ABDOMEN AND PELVIS FINDINGS VASCULAR Aorta: Severe atherosclerotic plaque. Normal caliber aorta without aneurysm, dissection, vasculitis or significant stenosis. Celiac: Patent without evidence of aneurysm, dissection, vasculitis or significant stenosis. SMA: Patent without evidence of aneurysm, dissection, vasculitis or significant stenosis. Renals: Mild to moderate atherosclerotic plaque at the origin of the renal arteries. Both renal arteries are patent without evidence of aneurysm, dissection, vasculitis, fibromuscular dysplasia or significant stenosis. IMA: Patent without evidence of aneurysm, dissection, vasculitis or significant stenosis. Inflow: Patent without evidence of aneurysm, dissection, vasculitis or significant stenosis. Veins: No obvious venous abnormality within the limitations of this arterial phase study. Review of the MIP images confirms the above findings. NON-VASCULAR Hepatobiliary: No focal liver abnormality. Status post cholecystectomy. No biliary dilatation. Pancreas: No focal lesion. Normal pancreatic contour. No surrounding inflammatory changes. No main pancreatic ductal dilatation. Spleen: Normal in size without focal abnormality. Adrenals/Urinary Tract: No adrenal nodule bilaterally. Bilateral kidneys enhance symmetrically. Subcentimeter hypodensities are too small to characterize. Couple of fluid density lesions within bilateral kidneys likely represent simple renal cysts. Couple of calcifications adjacent to a simple renal cyst within the inferior pole of right kidney  measuring up to 6 mm. No hydronephrosis. No hydroureter. The urinary bladder is unremarkable. Stomach/Bowel: Stomach is within normal limits. No evidence of bowel wall thickening or dilatation. The appendix is not definitely identified with no inflammatory changes in the right lower quadrant to suggest acute appendicitis. Lymphatic: No lymphadenopathy. Reproductive: Status post  hysterectomy. No adnexal masses. Other: No intraperitoneal free fluid. No intraperitoneal free gas. No organized fluid collection. Musculoskeletal: No abdominal wall hernia or abnormality. No suspicious lytic or blastic osseous lesions. No acute displaced fracture. Multilevel degenerative changes of the spine. Grade 1 anterolisthesis of L4 on L5. Review of the MIP images confirms the above findings. IMPRESSION: 1. Nonocclusive distal central left pulmonary embolus extending to the left upper and left lower segmental levels. No heart strain or pulmonary infarction. 2. A 2.2 x 2.8 cm right upper lobe solid and ground-glass airspace opacity that is nodular-like centrally. Finding could represent infection versus inflammation versus malignancy. Additional imaging evaluation or consultation with Pulmonology or Thoracic Surgery recommended. 3. No acute aortic abnormality. Aortic Atherosclerosis (ICD10-I70.0). 4. Trace left pleural effusion. 5. Nonobstructive right nephrolithiasis measuring up to 6 mm. 6. Other imaging findings of potential clinical significance: Several bilateral scattered pulmonary micronodules within the lungs some calcified and some not. No follow-up needed if patient is low-risk (and has no known or suspected primary neoplasm). Non-contrast chest CT can be considered in 12 months if patient is high-risk. This recommendation follows the consensus statement: Guidelines for Management of Incidental Pulmonary Nodules Detected on CT Images: From the Fleischner Society 2017; Radiology 2017; 284:228-243. These results were called by telephone at the time of interpretation on 11/23/2021 at 3:05 pm to provider DAN FLOYD , who verbally acknowledged these results. Electronically Signed   By: Iven Finn M.D.   On: 11/23/2021 15:22    Scheduled Meds:  atorvastatin  20 mg Oral Daily   diclofenac Sodium  2 g Topical Daily   docusate sodium  100 mg Oral BID   DULoxetine  30 mg Oral BID   gabapentin  300 mg  Oral Daily   nebivolol  5 mg Oral Daily   pantoprazole  40 mg Oral Daily   primidone  50 mg Oral QHS   sodium chloride flush  3 mL Intravenous Q12H   traZODone  100 mg Oral QHS   Continuous Infusions:  heparin 1,200 Units/hr (11/23/21 1539)     LOS: 0 days   Time spent: 4min  Symeon Puleo C Ludene Stokke, DO [facility]  If 7PM-7AM, please contact night-coverage [email or web address]  11/24/2021, 8:08 AM

## 2021-11-24 NOTE — Evaluation (Signed)
Physical Therapy Evaluation Patient Details Name: Kristen [name] MRN: [id number] DOB: Aug 10, 1933 Today's Date: 11/24/2021  History of Present Illness  The pt is an 88 yo female [facility] with L-sided chest pain. Work up revealed acute PE and pt started on Eliquis. PMH includes: arthritis, trigeminal neuralgia, depression, dyslipidemia, HTN, and bilateral TKA.   Clinical Impression  Pt in bed upon arrival of PT, agreeable to evaluation at this time. Prior to admission the pt was ambulating without need for assistive device, attending OPPT 2x/wk, and completing ADLs independently at her independent living facility. The pt now presents with limitations in functional mobility, strength, power, endurance, and stability due to above dx and resulting pain, and will continue to benefit from skilled PT to address these deficits. The pt was able to complete short bout of gait in the room as well as multiple sit-stand transfers to complete lower body dressing at this time. She remained on 2L O2 with SpO2 >90% with all mobility. The pt did complete mobility with minA through HHA, but was seeking BUE support for stability throughout walk and therefore discussed plan to use RW in future for improved stability. The pt and her daughter are in agreement with plan for the pt to d/c home with the daughter and complete HHPT there with more supervision from daughter prior to returning to ILF. Will continue to benefit from skilled PT to address deficits above as pt hopes to regain independence without need for DME.         Recommendations for follow up therapy are one component of a multi-disciplinary discharge planning process, led by the attending physician.  Recommendations may be updated based on patient status, additional functional criteria and insurance authorization.  Follow Up Recommendations Home health PT    Assistance Recommended at Discharge Frequent  or constant Supervision/Assistance  Patient can return home with the following  A little help with walking and/or transfers;A little help with bathing/dressing/bathroom;Assistance with cooking/housework;Direct supervision/assist for medications management;Assist for transportation;Help with stairs or ramp for entrance    Equipment Recommendations Rolling walker (2 wheels);BSC/3in1  Recommendations for Other Services       Functional Status Assessment Patient has had a recent decline in their functional status and demonstrates the ability to make significant improvements in function in a reasonable and predictable amount of time.     Precautions / Restrictions Precautions Precautions: Fall Precaution Comments: pt on 2L for mobility Restrictions Weight Bearing Restrictions: No      Mobility  Bed Mobility Overal bed mobility: Needs Assistance Bed Mobility: Supine to Sit, Sit to Supine     Supine to sit: Supervision Sit to supine: Supervision   General bed mobility comments: increased time and effort    Transfers Overall transfer level: Needs assistance Equipment used: None Transfers: Sit to/from Stand Sit to Stand: Min guard           General transfer comment: minG for safety, pt completed x10 within session with no DME    Ambulation/Gait Ambulation/Gait assistance: Min assist Gait Distance (Feet): 15 Feet (+ 15 ft) Assistive device: 1 person hand held assist Gait Pattern/deviations: Step-to pattern, Decreased stride length, Shuffle, Trunk flexed Gait velocity: decreased Gait velocity interpretation: <1.31 ft/sec, indicative of household ambulator   General Gait Details: pt with slightly flexed trunk and minA to steady through HHA.     Balance Overall balance assessment: Needs assistance Sitting-balance support: No upper extremity supported, Feet supported Sitting balance-Leahy Scale: Good  Standing balance support: Single extremity supported, During  functional activity Standing balance-Leahy Scale: Poor Standing balance comment: pt reaching for BUE support with gait, minA through HHA to steady                             Pertinent Vitals/Pain Pain Assessment Pain Assessment: Faces Faces Pain Scale: Hurts whole lot Pain Location: L chest Pain Descriptors / Indicators: Discomfort, Guarding Pain Intervention(s): Limited activity within patient's tolerance, Monitored during session, Repositioned    Home Living Family/patient expects to be discharged to:: Private residence Living Arrangements: Alone (Independent Living Facility) Available Help at Discharge: Family;Available 24 hours/day (daughter will be working from home) Type of Home: House Home Access: Level entry       Home Layout: One level Home Equipment: Cane - single point;Shower seat Additional Comments: pt lives in handicap accessible apt at independent living, plans to d/c to daughters house (described above)    Prior Function Prior Level of Function : Independent/Modified Independent;Driving             Mobility Comments: pt enjoys driving, attends OP therapy 2x/week. no use of DME despite some falls in past ADLs Comments: pt reports full independence     Hand Dominance        Extremity/Trunk Assessment   Upper Extremity Assessment Upper Extremity Assessment: LUE deficits/detail LUE Deficits / Details: limited by pain in chest with shoulder flex or abduction, pt able to use functionally LUE: Unable to fully assess due to pain LUE Sensation: WNL LUE Coordination: WNL    Lower Extremity Assessment Lower Extremity Assessment: Generalized weakness (grossly 4/5, pt reports normal sensation, poor endurance)    Cervical / Trunk Assessment Cervical / Trunk Assessment: Normal  Communication   Communication: No difficulties  Cognition Arousal/Alertness: Awake/alert Behavior During Therapy: WFL for tasks assessed/performed Overall Cognitive  Status: Within Functional Limits for tasks assessed                                 General Comments: pt needing some re-orientation regarding deficits and safety, discussed how this medical event is a big change from her baseline and therefore short term use of RW could prevent additional falls. pt reluctant to education but eventually agreeable        General Comments General comments (skin integrity, edema, etc.): VSS on 2L        Assessment/Plan    PT Assessment Patient needs continued PT services  PT Problem List Decreased range of motion;Decreased strength;Decreased activity tolerance;Decreased balance;Decreased mobility;Decreased safety awareness;Cardiopulmonary status limiting activity       PT Treatment Interventions Gait training;DME instruction;Stair training;Functional mobility training;Therapeutic activities;Therapeutic exercise;Balance training;Patient/family education    PT Goals (Current goals can be found in the Care Plan section)  Acute Rehab PT Goals Patient Stated Goal: return to not using RW PT Goal Formulation: With patient Time For Goal Achievement: 12/08/21 Potential to Achieve Goals: Good    Frequency Min 3X/week        AM-PAC PT "6 Clicks" Mobility  Outcome Measure Help needed turning from your back to your side while in a flat bed without using bedrails?: A Little Help needed moving from lying on your back to sitting on the side of a flat bed without using bedrails?: A Little Help needed moving to and from a bed to a chair (including a wheelchair)?: A Little Help needed  standing up from a chair using your arms (e.g., wheelchair or bedside chair)?: A Little Help needed to walk in hospital room?: A Little Help needed climbing 3-5 steps with a railing? : A Little 6 Click Score: 18    End of Session Equipment Utilized During Treatment: Gait belt;Oxygen Activity Tolerance: Patient tolerated treatment well Patient left: in bed;with  call bell/phone within reach;with family/visitor present Nurse Communication: Mobility status PT Visit Diagnosis: Unsteadiness on feet (R26.81);Other abnormalities of gait and mobility (R26.89);Muscle weakness (generalized) (M62.81);Pain Pain - Right/Left: Left Pain - part of body: Shoulder    Time: 3220-2542 PT Time Calculation (min) (ACUTE ONLY): 39 min   Charges:   PT Evaluation $PT Eval Moderate Complexity: 1 Mod PT Treatments $Therapeutic Exercise: 8-22 mins $Therapeutic Activity: 8-22 mins        Vickki Muff, PT, DPT   Acute Rehabilitation Department Pager #: [phone]  Ronnie Derby 11/24/2021, 6:29 PM

## 2021-11-24 NOTE — Progress Notes (Signed)
ANTICOAGULATION CONSULT NOTE Pharmacy Consult for Heparin Indication: pulmonary embolus  Allergies  Allergen Reactions   Penicillins Hives and Rash    Other reaction(s): Other unknown    Tetracyclines & Related Anaphylaxis    Other reaction(s): Other unknown    Sulfa Antibiotics Hives and Other (See Comments)    unknown    Patient Measurements: Height: 5\' 4"  (162.6 cm) Weight: 66.4 kg (146 lb 6.2 oz) IBW/kg (Calculated) : 54.7 Heparin Dosing Weight: 66.4 kg  Vital Signs:    Labs: Recent Labs    11/23/21 1340 11/23/21 1406 11/23/21 1535 11/24/21 0001 11/24/21 0327  HGB 12.2 12.9  --   --  10.2*  HCT 37.2 38.0  --   --  30.0*  PLT 196  --   --   --  147*  LABPROT  --   --  13.8  --   --   INR  --   --  1.1  --   --   HEPARINUNFRC  --   --   --  0.75* 0.71*  CREATININE 1.39* 1.40*  --   --  1.19*  TROPONINIHS 7  --   --   --   --      Estimated Creatinine Clearance: 31.8 mL/min (A) (by C-G formula based on SCr of 1.19 mg/dL (H)).   Assessment: 88 y.o. female with new PE on IV heparin. Pt on no AC PTA. Heparin level slightly above goal at 0.71, CBC is stable.  Goal of Therapy:  Heparin level 0.3-0.7 units/ml Monitor platelets by anticoagulation protocol: Yes   Plan:  Reduce heparin to 1100 units/h Repeat heparin level in 8h  88, PharmD, [place], [facility] Clinical Pharmacist [phone] Please check AMION for all [facility] numbers 11/24/2021

## 2021-11-24 NOTE — Care Management Obs Status (Signed)
MEDICARE OBSERVATION STATUS NOTIFICATION   Patient Details  Name: Kristen [name] MRN: [id number] Date of Birth: Mar 07, 1933   Medicare Observation Status Notification Given:  Yes    Gala Lewandowsky, RN 11/24/2021, 12:47 PM

## 2021-11-24 NOTE — Progress Notes (Signed)
ANTICOAGULATION CONSULT NOTE Pharmacy Consult for Heparin Indication: pulmonary embolus Brief A/P: Heparin level slightly supratherapeutic Continue Heparin at current rate for now  Allergies  Allergen Reactions   Penicillins Hives and Rash    Other reaction(s): Other unknown    Tetracyclines & Related Anaphylaxis    Other reaction(s): Other unknown    Sulfa Antibiotics Hives and Other (See Comments)    unknown    Patient Measurements: Height: 5\' 4"  (162.6 cm) Weight: 66.4 kg (146 lb 6.2 oz) IBW/kg (Calculated) : 54.7 Heparin Dosing Weight: 66.4 kg  Vital Signs: Temp: 100 F (37.8 C) (01/29 1727) Temp Source: Oral (01/29 1727) BP: 156/68 (01/29 1817) Pulse Rate: 91 (01/29 1817)  Labs: Recent Labs    11/23/21 1340 11/23/21 1406 11/23/21 1535 11/24/21 0001  HGB 12.2 12.9  --   --   HCT 37.2 38.0  --   --   PLT 196  --   --   --   LABPROT  --   --  13.8  --   INR  --   --  1.1  --   HEPARINUNFRC  --   --   --  0.75*  CREATININE 1.39* 1.40*  --   --   TROPONINIHS 7  --   --   --      Estimated Creatinine Clearance: 27 mL/min (A) (by C-G formula based on SCr of 1.4 mg/dL (H)).   Assessment: 88 y.o. female with new PE for heparin.  Heparin level just above goal  Goal of Therapy:  Heparin level 0.3-0.7 units/ml Monitor platelets by anticoagulation protocol: Yes   Plan:  Continue Heparin at current rate for now Follow-up am labs and adjust rate if needed at that time  89, PharmD, BCPS

## 2021-11-25 ENCOUNTER — Other Ambulatory Visit (HOSPITAL_COMMUNITY): Payer: Self-pay

## 2021-11-25 DIAGNOSIS — Z66 Do not resuscitate: Secondary | ICD-10-CM | POA: Diagnosis not present

## 2021-11-25 DIAGNOSIS — R911 Solitary pulmonary nodule: Secondary | ICD-10-CM | POA: Diagnosis not present

## 2021-11-25 DIAGNOSIS — I10 Essential (primary) hypertension: Secondary | ICD-10-CM | POA: Diagnosis not present

## 2021-11-25 DIAGNOSIS — G894 Chronic pain syndrome: Secondary | ICD-10-CM | POA: Diagnosis not present

## 2021-11-25 DIAGNOSIS — I2699 Other pulmonary embolism without acute cor pulmonale: Secondary | ICD-10-CM | POA: Diagnosis not present

## 2021-11-25 DIAGNOSIS — N1832 Chronic kidney disease, stage 3b: Secondary | ICD-10-CM | POA: Diagnosis not present

## 2021-11-25 DIAGNOSIS — E782 Mixed hyperlipidemia: Secondary | ICD-10-CM | POA: Diagnosis not present

## 2021-11-25 LAB — BASIC METABOLIC PANEL
Anion gap: 5 (ref 5–15)
BUN: 14 mg/dL (ref 8–23)
CO2: 29 mmol/L (ref 22–32)
Calcium: 8.1 mg/dL — ABNORMAL LOW (ref 8.9–10.3)
Chloride: 100 mmol/L (ref 98–111)
Creatinine, Ser: 1.09 mg/dL — ABNORMAL HIGH (ref 0.44–1.00)
GFR, Estimated: 49 mL/min — ABNORMAL LOW (ref >60)
Glucose, Bld: 97 mg/dL (ref 70–99)
Potassium: 4.4 mmol/L (ref 3.5–5.1)
Sodium: 134 mmol/L — ABNORMAL LOW (ref 135–145)

## 2021-11-25 LAB — CBC
HCT: 29.3 % — ABNORMAL LOW (ref 36.0–46.0)
Hemoglobin: 9.7 g/dL — ABNORMAL LOW (ref 12.0–15.0)
MCH: 31.3 pg (ref 26.0–34.0)
MCHC: 33.1 g/dL (ref 30.0–36.0)
MCV: 94.5 fL (ref 80.0–100.0)
Platelets: 149 10*3/uL — ABNORMAL LOW (ref 150–400)
RBC: 3.1 MIL/uL — ABNORMAL LOW (ref 3.87–5.11)
RDW: 11.8 % (ref 11.5–15.5)
WBC: 6.9 10*3/uL (ref 4.0–10.5)
nRBC: 0 % (ref 0.0–0.2)

## 2021-11-25 LAB — HEPARIN LEVEL (UNFRACTIONATED): Heparin Unfractionated: 0.72 IU/mL — ABNORMAL HIGH (ref 0.30–0.70)

## 2021-11-25 MED ORDER — ENOXAPARIN SODIUM 60 MG/0.6ML IJ SOSY
60.0000 mg | PREFILLED_SYRINGE | Freq: Two times a day (BID) | INTRAMUSCULAR | Status: DC
  Administered 2021-11-25 (×2): 60 mg via SUBCUTANEOUS
  Filled 2021-11-25 (×2): qty 0.6

## 2021-11-25 NOTE — Care Management (Signed)
8101 11-25-21 Case Manager spoke with the patient regarding home health services. Patient states she is active with PT at [facility]. Case Manager called [facility] and the patient uses [facility]- the office will accept the patient without any resumption orders. Case Manager will follow for oxygen, rolling walker, and bedside commode. Family would like to use [facility] for durable medical services once orders written. Daughter to call Case Manager back if she has a family member that has a bedside commode. Patient will transition to her daughters home for at least 1-2 weeks. Daughters address is [address] [place] [address]. Case Manager will continue to follow for additional transition of care needs.

## 2021-11-25 NOTE — Progress Notes (Addendum)
PROGRESS NOTE    Kristen [name]  [id number] DOB: 1933-08-26 DOA: 11/23/2021 PCP: Kathalene Frames, MD   Brief Narrative:  Kristen [name] is a 88 y.o. female with medical history significant of chronic pain; seizure d/o; depression; HTN; and HLD presenting with left-sided CP.  She reports that she developed horrible pain in her L chest near her breast and around to her back. CTA chest shows PE. Patient admitted for symptom management, need for IV anticoagulation and further evaluation.  Assessment & Plan:   Acute provoked PE with intractable chest pain, acute hypoxic respiratory failure, POA Continue to wean oxygen - remains hypoxic -may require oxygen at discharge PT evaluation ongoing -Unfortunately patient is currently on primidone which limits her anticoagulation options.  Lengthy discussion with daughter today over the phone requesting that Dr. Koleen Nimrod the patient's primary care physician be involved in this discussion.  **Afternoon update, lengthy discussion with Dr. Koleen Nimrod, plan currently is to move forward with Pradaxa given grade C contraindication and inability to otherwise anticoagulate the patient safely given DOAC are otherwise contraindicated and family is refusing warfarin.  Only other option would be Lovenox, however this becomes quite cost prohibitive given the need for 3 to 6 months of anticoagulation and Dr. Koleen Nimrod is not sure the patient will tolerate/be compliant with daily abdominal injections.  CKD 3b At baseline  Chronic pain Continue home meds  HTN HLD Continue home nebivolol  Incidental pulmonary nodule Outpatient imaging in 3-6 months per protocol  DVT prophylaxis: Lovenox Code Status: DNR Family Communication: Daughter updated over the phone  Status is: Observation  Dispo: The patient is from: ALF              Anticipated d/c is to: Daughter requesting discharge to her home and not return to assisted living              Anticipated d/c  date is: 24h              Patient currently NOT medically stable for discharge  Consultants:  None  Procedures:  None planned  Antimicrobials:  None indicated   Subjective: No acute issues or events overnight, patient continues to complain of pleuritic chest pain with deep inspiration and activity but otherwise denies nausea vomiting diarrhea constipation headache fevers chills or shortness of breath.  Objective: Vitals:   11/24/21 1348 11/24/21 1623 11/24/21 2008 11/25/21 0637  BP: 117/71 (!) 116/52 (!) 155/51 (!) 167/60  Pulse:  72 73 80  Resp: (!) 22  20 (!) 21  Temp: 98.4 F (36.9 C)  98 F (36.7 C) 98 F (36.7 C)  TempSrc: Oral  Oral Oral  SpO2:  99% 96% 94%  Weight:      Height:        Intake/Output Summary (Last 24 hours) at 11/25/2021 0754 Last data filed at 11/25/2021 0400 Gross per 24 hour  Intake 671.53 ml  Output --  Net 671.53 ml    Filed Weights   11/23/21 1522  Weight: 66.4 kg    Examination:  General:  Pleasantly resting in bed, No acute distress. HEENT:  Normocephalic atraumatic.  Sclerae nonicteric, noninjected.  Extraocular movements intact bilaterally. Neck:  Without mass or deformity.  Trachea is midline. Lungs:  Clear to auscultate bilaterally without rhonchi, wheeze, or rales. Heart:  Regular rate and rhythm.  Without murmurs, rubs, or gallops. Abdomen:  Soft, nontender, nondistended.  Without guarding or rebound. Extremities: Without cyanosis, clubbing, edema, or obvious deformity. Vascular:  Dorsalis pedis and  posterior tibial pulses palpable bilaterally. Skin:  Warm and dry, no erythema, no ulcerations.  Data Reviewed: I have personally reviewed following labs and imaging studies  CBC: Recent Labs  Lab 11/23/21 1340 11/23/21 1406 11/24/21 0327 11/25/21 0328  WBC 9.9  --  10.1 6.9  NEUTROABS 7.0  --   --   --   HGB 12.2 12.9 10.2* 9.7*  HCT 37.2 38.0 30.0* 29.3*  MCV 96.4  --  94.9 94.5  PLT 196  --  147* 149*    Basic  Metabolic Panel: Recent Labs  Lab 11/23/21 1340 11/23/21 1406 11/24/21 0327  NA 135 136 133*  K 4.5 4.5 4.1  CL 99 99 100  CO2 27  --  25  GLUCOSE 116* 108* 122*  BUN 16 19 14   CREATININE 1.39* 1.40* 1.19*  CALCIUM 8.3*  --  8.0*    GFR: Estimated Creatinine Clearance: 31.8 mL/min (A) (by C-G formula based on SCr of 1.19 mg/dL (H)). Liver Function Tests: Recent Labs  Lab 11/23/21 1340  AST 19  ALT 10  ALKPHOS 79  BILITOT 1.1  PROT 6.4*  ALBUMIN 3.8    Recent Labs  Lab 11/23/21 1340  LIPASE 27    No results for input(s): AMMONIA in the last 168 hours. Coagulation Profile: Recent Labs  Lab 11/23/21 1535  INR 1.1    Cardiac Enzymes: No results for input(s): CKTOTAL, CKMB, CKMBINDEX, TROPONINI in the last 168 hours. BNP (last 3 results) No results for input(s): PROBNP in the last 8760 hours. HbA1C: No results for input(s): HGBA1C in the last 72 hours. CBG: No results for input(s): GLUCAP in the last 168 hours. Lipid Profile: No results for input(s): CHOL, HDL, LDLCALC, TRIG, CHOLHDL, LDLDIRECT in the last 72 hours. Thyroid Function Tests: No results for input(s): TSH, T4TOTAL, FREET4, T3FREE, THYROIDAB in the last 72 hours. Anemia Panel: No results for input(s): VITAMINB12, FOLATE, FERRITIN, TIBC, IRON, RETICCTPCT in the last 72 hours. Sepsis Labs: No results for input(s): PROCALCITON, LATICACIDVEN in the last 168 hours.  Recent Results (from the past 240 hour(s))  Resp Panel by RT-PCR (Flu A&B, Covid) Nasopharyngeal Swab     Status: None   Collection Time: 11/23/21  3:16 PM   Specimen: Nasopharyngeal Swab; Nasopharyngeal(NP) swabs in vial transport medium  Result Value Ref Range Status   SARS Coronavirus 2 by RT PCR NEGATIVE NEGATIVE Final    Comment: (NOTE) SARS-CoV-2 target nucleic acids are NOT DETECTED.  The SARS-CoV-2 RNA is generally detectable in upper respiratory specimens during the acute phase of infection. The lowest concentration of  SARS-CoV-2 viral copies this assay can detect is 138 copies/mL. A negative result does not preclude SARS-Cov-2 infection and should not be used as the sole basis for treatment or other patient management decisions. A negative result may occur with  improper specimen collection/handling, submission of specimen other than nasopharyngeal swab, presence of viral mutation(s) within the areas targeted by this assay, and inadequate number of viral copies(<138 copies/mL). A negative result must be combined with clinical observations, patient history, and epidemiological information. The expected result is Negative.  Fact Sheet for Patients:  EntrepreneurPulse.com.au  Fact Sheet for Healthcare Providers:  IncredibleEmployment.be  This test is no t yet approved or cleared by the [place] FDA and  has been authorized for detection and/or diagnosis of SARS-CoV-2 by FDA under an Emergency Use Authorization (EUA). This EUA will remain  in effect (meaning this test can be used) for the duration of the COVID-19  declaration under Section 564(b)(1) of the Act, 21 U.S.C.section 360bbb-3(b)(1), unless the authorization is terminated  or revoked sooner.       Influenza A by PCR NEGATIVE NEGATIVE Final   Influenza B by PCR NEGATIVE NEGATIVE Final    Comment: (NOTE) The Xpert Xpress SARS-CoV-2/FLU/RSV plus assay is intended as an aid in the diagnosis of influenza from Nasopharyngeal swab specimens and should not be used as a sole basis for treatment. Nasal washings and aspirates are unacceptable for Xpert Xpress SARS-CoV-2/FLU/RSV testing.  Fact Sheet for Patients: EntrepreneurPulse.com.au  Fact Sheet for Healthcare Providers: IncredibleEmployment.be  This test is not yet approved or cleared by the [place] FDA and has been authorized for detection and/or diagnosis of SARS-CoV-2 by FDA under an Emergency Use  Authorization (EUA). This EUA will remain in effect (meaning this test can be used) for the duration of the COVID-19 declaration under Section 564(b)(1) of the Act, 21 U.S.C. section 360bbb-3(b)(1), unless the authorization is terminated or revoked.  Performed at [facility], [place] [address], [place], [place] [address]      Radiology Studies: DG Chest Port 1 View  Result Date: 11/23/2021 CLINICAL DATA:  Chest pain. EXAM: PORTABLE CHEST 1 VIEW COMPARISON:  None. FINDINGS: The heart size and mediastinal contours are within normal limits. Right lung is clear. Elevated left hemidiaphragm is noted with minimal left basilar atelectasis. The visualized skeletal structures are unremarkable. IMPRESSION: Elevated left hemidiaphragm is noted with minimal left basilar atelectasis. Electronically Signed   By: Marijo Conception M.D.   On: 11/23/2021 14:15   CT Angio Chest/Abd/Pel for Dissection W and/or Wo Contrast  Result Date: 11/23/2021 CLINICAL DATA:  Chest pain or back pain, aortic dissection suspected EXAM: CT ANGIOGRAPHY CHEST, ABDOMEN AND PELVIS TECHNIQUE: Non-contrast CT of the chest was initially obtained. Multidetector CT imaging through the chest, abdomen and pelvis was performed using the standard protocol during bolus administration of intravenous contrast. Multiplanar reconstructed images and MIPs were obtained and reviewed to evaluate the vascular anatomy. RADIATION DOSE REDUCTION: This exam was performed according to the departmental dose-optimization program which includes automated exposure control, adjustment of the mA and/or kV according to patient size and/or use of iterative reconstruction technique. CONTRAST:  32mL OMNIPAQUE IOHEXOL 350 MG/ML SOLN COMPARISON:  Chest x-ray 11/23/2021 FINDINGS: CTA CHEST FINDINGS Cardiovascular: Satisfactory opacification of the pulmonary arteries to the segmental level. Nonocclusive distal central left pulmonary embolus extending to the left upper and  left lower segmental levels. Normal heart size. No significant pericardial effusion. The thoracic aorta is normal in caliber. No definite aortic dissection. Mild atherosclerotic plaque of the thoracic aorta. At least 2 vessel coronary artery calcifications. Mediastinum/Nodes: No enlarged mediastinal, hilar, or axillary lymph nodes. Thyroid gland, trachea, and esophagus demonstrate no significant findings. Elevated left hemidiaphragm. Lungs/Pleura: Right upper lobe centrally solid and peripherally ground-glass airspace opacity with the solid portions appearing nodular-like measuring 2.2 x 2.8 cm (8:28). Subpleural left upper lobe pulmonary micronodule (8:29). Subpleural left apical calcified pulmonary micronodule (8:14). Pulmonary micronodule within the right middle lobe (8:55). No pulmonary mass. No pleural effusion. No pneumothorax. Musculoskeletal: No chest wall abnormality. No suspicious lytic or blastic osseous lesions. No acute displaced fracture. Old healed left rib fractures. Multilevel degenerative changes of the spine. T11 vertebral body hemangioma. Review of the MIP images confirms the above findings. CTA ABDOMEN AND PELVIS FINDINGS VASCULAR Aorta: Severe atherosclerotic plaque. Normal caliber aorta without aneurysm, dissection, vasculitis or significant stenosis. Celiac: Patent without evidence of aneurysm, dissection, vasculitis  or significant stenosis. SMA: Patent without evidence of aneurysm, dissection, vasculitis or significant stenosis. Renals: Mild to moderate atherosclerotic plaque at the origin of the renal arteries. Both renal arteries are patent without evidence of aneurysm, dissection, vasculitis, fibromuscular dysplasia or significant stenosis. IMA: Patent without evidence of aneurysm, dissection, vasculitis or significant stenosis. Inflow: Patent without evidence of aneurysm, dissection, vasculitis or significant stenosis. Veins: No obvious venous abnormality within the limitations of this  arterial phase study. Review of the MIP images confirms the above findings. NON-VASCULAR Hepatobiliary: No focal liver abnormality. Status post cholecystectomy. No biliary dilatation. Pancreas: No focal lesion. Normal pancreatic contour. No surrounding inflammatory changes. No main pancreatic ductal dilatation. Spleen: Normal in size without focal abnormality. Adrenals/Urinary Tract: No adrenal nodule bilaterally. Bilateral kidneys enhance symmetrically. Subcentimeter hypodensities are too small to characterize. Couple of fluid density lesions within bilateral kidneys likely represent simple renal cysts. Couple of calcifications adjacent to a simple renal cyst within the inferior pole of right kidney measuring up to 6 mm. No hydronephrosis. No hydroureter. The urinary bladder is unremarkable. Stomach/Bowel: Stomach is within normal limits. No evidence of bowel wall thickening or dilatation. The appendix is not definitely identified with no inflammatory changes in the right lower quadrant to suggest acute appendicitis. Lymphatic: No lymphadenopathy. Reproductive: Status post hysterectomy. No adnexal masses. Other: No intraperitoneal free fluid. No intraperitoneal free gas. No organized fluid collection. Musculoskeletal: No abdominal wall hernia or abnormality. No suspicious lytic or blastic osseous lesions. No acute displaced fracture. Multilevel degenerative changes of the spine. Grade 1 anterolisthesis of L4 on L5. Review of the MIP images confirms the above findings. IMPRESSION: 1. Nonocclusive distal central left pulmonary embolus extending to the left upper and left lower segmental levels. No heart strain or pulmonary infarction. 2. A 2.2 x 2.8 cm right upper lobe solid and ground-glass airspace opacity that is nodular-like centrally. Finding could represent infection versus inflammation versus malignancy. Additional imaging evaluation or consultation with Pulmonology or Thoracic Surgery recommended. 3. No acute  aortic abnormality. Aortic Atherosclerosis (ICD10-I70.0). 4. Trace left pleural effusion. 5. Nonobstructive right nephrolithiasis measuring up to 6 mm. 6. Other imaging findings of potential clinical significance: Several bilateral scattered pulmonary micronodules within the lungs some calcified and some not. No follow-up needed if patient is low-risk (and has no known or suspected primary neoplasm). Non-contrast chest CT can be considered in 12 months if patient is high-risk. This recommendation follows the consensus statement: Guidelines for Management of Incidental Pulmonary Nodules Detected on CT Images: From the Fleischner Society 2017; Radiology 2017; 284:228-243. These results were called by telephone at the time of interpretation on 11/23/2021 at 3:05 pm to provider DAN FLOYD , who verbally acknowledged these results. Electronically Signed   By: Iven Finn M.D.   On: 11/23/2021 15:22   ECHOCARDIOGRAM LIMITED  Result Date: 11/24/2021    ECHOCARDIOGRAM LIMITED REPORT   Patient Name:   Mikaelah Funderburke Date of Exam: 11/24/2021 Medical Rec #:  [id number]    Height:       64.0 in Accession #:    [id number]   Weight:       146.4 lb Date of Birth:  1933-10-11    BSA:          1.713 m Patient Age:    9 years     BP:           156/68 mmHg Patient Gender: F            HR:  69 bpm. Exam Location:  Inpatient Procedure: 2D Echo, Limited Echo and Limited Color Doppler Indications:    I26.09 PULM. EMBOLUS  History:        Patient has prior history of Echocardiogram examinations, most                 recent 10/07/2021. Risk Factors:Dyslipidemia and Hypertension.  Sonographer:    Beryle Beams Referring Phys: [place]  1. Left ventricular ejection fraction, by estimation, is 60 to 65%. The left ventricle has normal function. The left ventricle has no regional wall motion abnormalities. There is mild concentric left ventricular hypertrophy.  2. Right ventricular systolic function is normal.  The right ventricular size is normal. Tricuspid regurgitation signal is inadequate for assessing PA pressure.  3. The mitral valve is normal in structure. No evidence of mitral valve regurgitation. No evidence of mitral stenosis.  4. Tricuspid valve regurgitation is mild to moderate.  5. The aortic valve is tricuspid. Aortic valve regurgitation is mild to moderate. Aortic valve sclerosis/calcification is present, without any evidence of aortic stenosis. Aortic regurgitation PHT measures 564 msec. Aortic valve area, by VTI measures 1.51 cm. Aortic valve mean gradient measures 7.0 mmHg. Aortic valve Vmax measures 1.66 m/s.  6. The inferior vena cava is normal in size with greater than 50% respiratory variability, suggesting right atrial pressure of 3 mmHg. Comparison(s): No prior Echocardiogram. FINDINGS  Left Ventricle: Left ventricular ejection fraction, by estimation, is 60 to 65%. The left ventricle has normal function. The left ventricle has no regional wall motion abnormalities. The left ventricular internal cavity size was normal in size. There is  mild concentric left ventricular hypertrophy. Right Ventricle: The right ventricular size is normal. No increase in right ventricular wall thickness. Right ventricular systolic function is normal. Tricuspid regurgitation signal is inadequate for assessing PA pressure. Left Atrium: Left atrial size was normal in size. Right Atrium: Right atrial size was normal in size. Pericardium: There is no evidence of pericardial effusion. Mitral Valve: The mitral valve is normal in structure. No evidence of mitral valve stenosis. Tricuspid Valve: The tricuspid valve is normal in structure. Tricuspid valve regurgitation is mild to moderate. No evidence of tricuspid stenosis. Aortic Valve: The aortic valve is tricuspid. Aortic valve regurgitation is mild to moderate. Aortic regurgitation PHT measures 564 msec. Aortic valve sclerosis/calcification is present, without any evidence  of aortic stenosis. Aortic valve mean gradient measures 7.0 mmHg. Aortic valve peak gradient measures 11.0 mmHg. Aortic valve area, by VTI measures 1.51 cm. Pulmonic Valve: The pulmonic valve was normal in structure. Pulmonic valve regurgitation is trivial. No evidence of pulmonic stenosis. Aorta: The aortic root is normal in size and structure. Venous: The inferior vena cava is normal in size with greater than 50% respiratory variability, suggesting right atrial pressure of 3 mmHg. IAS/Shunts: No atrial level shunt detected by color flow Doppler. LEFT VENTRICLE PLAX 2D LVIDd:         3.70 cm LVIDs:         2.10 cm LV PW:         1.10 cm LV IVS:        1.00 cm LVOT diam:     1.60 cm LV SV:         54 LV SV Index:   32 LVOT Area:     2.01 cm  LV Volumes (MOD) LV vol d, MOD A2C: 56.9 ml LV vol d, MOD A4C: 61.4 ml LV vol s, MOD A2C: 18.7  ml LV vol s, MOD A4C: 20.4 ml LV SV MOD A2C:     38.2 ml LV SV MOD A4C:     61.4 ml LV SV MOD BP:      39.4 ml RIGHT VENTRICLE             IVC RV Basal diam:  4.10 cm     IVC diam: 1.20 cm RV Mid diam:    3.70 cm RV S prime:     16.60 cm/s RVOT diam:      2.20 cm TAPSE (M-mode): 1.4 cm LEFT ATRIUM         Index       RIGHT ATRIUM           Index LA diam:    3.40 cm 1.98 cm/m  RA Area:     10.80 cm                                 RA Volume:   22.60 ml  13.19 ml/m  AORTIC VALVE                     PULMONIC VALVE AV Area (Vmax):    1.34 cm      PV Vmax:       0.52 m/s AV Area (Vmean):   1.31 cm      PV Vmean:      34.400 cm/s AV Area (VTI):     1.51 cm      PV VTI:        0.107 m AV Vmax:           166.00 cm/s   PV Peak grad:  1.1 mmHg AV Vmean:          121.000 cm/s  PV Mean grad:  1.0 mmHg AV VTI:            0.360 m AV Peak Grad:      11.0 mmHg AV Mean Grad:      7.0 mmHg LVOT Vmax:         111.00 cm/s LVOT Vmean:        79.000 cm/s LVOT VTI:          0.271 m LVOT/AV VTI ratio: 0.75 AI PHT:            564 msec  AORTA Ao Root diam: 2.80 cm Ao Asc diam:  2.70 cm TRICUSPID VALVE TV  Peak grad:   39.2 mmHg TV Mean grad:   31.0 mmHg TV Vmax:        3.13 m/s TV Vmean:       272.0 cm/s TV VTI:         1.02 msec  SHUNTS Systemic VTI:  0.27 m Systemic Diam: 1.60 cm Pulmonic Diam: 2.20 cm Kardie Tobb DO Electronically signed by Berniece Salines DO Signature Date/Time: 11/24/2021/10:48:16 AM    Final     Scheduled Meds:  atorvastatin  20 mg Oral Daily   diclofenac Sodium  2 g Topical Daily   docusate sodium  100 mg Oral BID   DULoxetine  30 mg Oral BID   gabapentin  300 mg Oral Daily   nebivolol  5 mg Oral Daily   pantoprazole  40 mg Oral Daily   primidone  50 mg Oral QHS   sodium chloride flush  3 mL Intravenous Q12H   traZODone  100 mg Oral QHS   Continuous  Infusions:  heparin 1,100 Units/hr (11/25/21 0707)     LOS: 0 days   Time spent: 59min  Jamion Carter C Jasalyn Frysinger, DO [facility]  If 7PM-7AM, please contact night-coverage [email or web address]  11/25/2021, 7:54 AM

## 2021-11-25 NOTE — Progress Notes (Signed)
ANTICOAGULATION CONSULT NOTE Pharmacy Consult for Heparin Indication: pulmonary embolus  Allergies  Allergen Reactions   Penicillins Hives and Rash    Other reaction(s): Other unknown    Tetracyclines & Related Anaphylaxis    Other reaction(s): Other unknown    Sulfa Antibiotics Hives and Other (See Comments)    unknown    Patient Measurements: Height: 5\' 4"  (162.6 cm) Weight: 66.4 kg (146 lb 6.2 oz) IBW/kg (Calculated) : 54.7 Heparin Dosing Weight: 66.4 kg  Vital Signs: Temp: 98 F (36.7 C) (01/31 0637) Temp Source: Oral (01/31 0637) BP: 167/60 (01/31 0637) Pulse Rate: 62 (01/31 0800)  Labs: Recent Labs    11/23/21 1340 11/23/21 1406 11/23/21 1535 11/24/21 0001 11/24/21 0327 11/25/21 0328  HGB 12.2 12.9  --   --  10.2* 9.7*  HCT 37.2 38.0  --   --  30.0* 29.3*  PLT 196  --   --   --  147* 149*  LABPROT  --   --  13.8  --   --   --   INR  --   --  1.1  --   --   --   HEPARINUNFRC  --   --   --  0.75* 0.71* 0.72*  CREATININE 1.39* 1.40*  --   --  1.19* 1.09*  TROPONINIHS 7  --   --   --   --   --      Estimated Creatinine Clearance: 34.7 mL/min (A) (by C-G formula based on SCr of 1.09 mg/dL (H)).   Assessment: 88 y.o. female with new PE on IV heparin. Pt on no AC PTA.   Heparin level remains slightly above goal at 0.72 despite rate adjustment.  Goal of Therapy:  Heparin level 0.3-0.7 units/ml Monitor platelets by anticoagulation protocol: Yes   Plan:  Reduce heparin to 950 units/h Daily heparin level and CBC   ADDENDUM: Discussed longterm oral anticoagulation options with Dr. Avon Gully. Given primidone use, would need to avoid rivaroxaban or apixaban. Could utilize dabigatran (though renal function borderline) or warfarin or edoxaban. Pt and family wish to discuss with PCP prior to decision, so will transition to enoxaparin in the meantime.  Plan: Stop heparin Enoxaparin 1mg /kg q12h (60mg )    Arrie Senate, PharmD, BCPS, [facility] Clinical  Pharmacist [phone] Please check AMION for all [facility] Pharmacy numbers 11/25/2021

## 2021-11-25 NOTE — Progress Notes (Signed)
Mobility Specialist: Progress Note   11/25/21 1328  Mobility  Activity Ambulated with assistance in hallway  Level of Assistance Contact guard assist, steadying assist  Assistive Device None  Distance Ambulated (ft) 360 ft  Activity Response Tolerated well  $Mobility charge 1 Mobility   Pre-Mobility: 55 HR Post-Mobility: 59 HR, 100% SpO2  Pt independent with bed mobility as well as to stand. To BR and then agreeable to ambulation, no c/o throughout. Ambulated on 2 L/min [place]. Assisted with contact guard for balance at times during mobility. Pt back to bed after walk with call bell and phone at her side.   [facility] Dajon Lazar Mobility Specialist Mobility Specialist 4 [place]: [phone] Mobility Specialist 2 [place] and [place]: [phone]

## 2021-11-25 NOTE — TOC Benefit Eligibility Note (Addendum)
Patient Product/process development scientist completed.    The patient is currently admitted and upon discharge could be taking dabigatran (Pradaxa) 150 mg.  The current 30 day co-pay is, $10.00.   The patient is currently admitted and upon discharge could be taking enoxaparin (Lovenox) 60 mg.  The current 30 day co-pay is, $10.00.   The patient is insured through [facility] Medicare Part D     Roland Earl, CPhT Pharmacy Patient Advocate Specialist [facility] Health Pharmacy Patient Advocate Team Direct Number: [phone]  Fax: [phone]

## 2021-11-25 NOTE — Progress Notes (Signed)
Tried weaning patient from oxygen. Patient's oxygen saturation went down to 88% when I took off her nasal cannula. Placed patient's nasal cannula back with 1 liter on, oxygen saturation went back up to 94%.

## 2021-11-25 NOTE — Progress Notes (Signed)
ANTICOAGULATION CONSULT NOTE Pharmacy Consult for Heparin Indication: pulmonary embolus  Allergies  Allergen Reactions   Penicillins Hives and Rash    Other reaction(s): Other unknown    Tetracyclines & Related Anaphylaxis    Other reaction(s): Other unknown    Sulfa Antibiotics Hives and Other (See Comments)    unknown    Patient Measurements: Height: 5\' 4"  (162.6 cm) Weight: 66.4 kg (146 lb 6.2 oz) IBW/kg (Calculated) : 54.7 Heparin Dosing Weight: 66.4 kg  Vital Signs: Temp: 98 F (36.7 C) (01/31 0637) Temp Source: Oral (01/31 0637) BP: 167/60 (01/31 0637) Pulse Rate: 62 (01/31 0800)  Labs: Recent Labs    11/23/21 1340 11/23/21 1406 11/23/21 1535 11/24/21 0001 11/24/21 0327 11/25/21 0328  HGB 12.2 12.9  --   --  10.2* 9.7*  HCT 37.2 38.0  --   --  30.0* 29.3*  PLT 196  --   --   --  147* 149*  LABPROT  --   --  13.8  --   --   --   INR  --   --  1.1  --   --   --   HEPARINUNFRC  --   --   --  0.75* 0.71* 0.72*  CREATININE 1.39* 1.40*  --   --  1.19*  --   TROPONINIHS 7  --   --   --   --   --      Estimated Creatinine Clearance: 31.8 mL/min (A) (by C-G formula based on SCr of 1.19 mg/dL (H)).   Assessment: 88 y.o. female with new PE on IV heparin. Pt on no AC PTA.   Heparin level remains slightly above goal at 0.72 despite rate adjustment.  Goal of Therapy:  Heparin level 0.3-0.7 units/ml Monitor platelets by anticoagulation protocol: Yes   Plan:  Reduce heparin to 950 units/h Daily heparin level and CBC  Arrie Senate, PharmD, [place], [facility] Clinical Pharmacist [phone] Please check AMION for all [facility] Pharmacy numbers 11/25/2021

## 2021-11-25 NOTE — Discharge Instructions (Addendum)
Information on my medicine - Pradaxa (dabigatran)  This medication education was reviewed with me or my healthcare representative as part of my discharge preparation.  The pharmacist that spoke with me during my hospital stay was:  Mosetta Anis, [facility]  Why was Pradaxa prescribed for you? Pradaxa was prescribed to treat blood clots that may have been found in the veins of your legs (deep vein thrombosis) or in your lungs (pulmonary embolism) and to reduce the risk of them occurring again.  Pradaxa will take the place of the injectable anticoagulant medication you have been receiving for this condition for the last 5-10 days.  What do you Need to know about PradAXa? Take your Pradaxa TWICE DAILY - one capsule in the morning and one tablet in the evening with or without food.  It would be best to take the doses about the same time each day.  The capsules should not be broken, chewed or opened - they must be swallowed whole.  Do not store Pradaxa in other medication containers - once the bottle is opened the Pradaxa should be used within FOUR months; throw away any capsules that havent been by that time.  Take Pradaxa exactly as prescribed by your doctor.  DO NOT stop taking Pradaxa without talking to the doctor who prescribed the medication.  Refill your prescription before you run out.  After discharge, you should have regular check-up appointments with your healthcare provider that is prescribing your Pradaxa.  In the future your dose may need to be changed if your kidney function or weight changes by a significant amount.  What do you do if you miss a dose? If you miss a dose, take it as soon as you remember on the same day.  If your next dose is less than 6 hours away, skip the missed dose.  Do not take two doses of PRADAXA at the same time.  Important Safety Information A possible side effect of Pradaxa is bleeding. You should call your healthcare provider right away if you  experience any of the following: Bleeding from an injury or your nose that does not stop. Unusual colored urine (red or dark brown) or unusual colored stools (red or black). Unusual bruising for unknown reasons. A serious fall or if you hit your head (even if there is no bleeding).  Some medicines may interact with Pradaxa and might increase your risk of bleeding or clotting while on Pradaxa. To help avoid this, consult your healthcare provider or pharmacist prior to using any new prescription or non-prescription medications, including herbals, vitamins, non-steroidal anti-inflammatory drugs (NSAIDs) and supplements.  This website has more information on Pradaxa (dabigatran): www.HDTVGame.dk.

## 2021-11-25 NOTE — Progress Notes (Signed)
Pt sleeping and O2 dropped to 84% 2L place on patient.

## 2021-11-25 NOTE — Evaluation (Signed)
Occupational Therapy Evaluation Patient Details Name: Kristen [name] MRN: [id number] DOB: Jul 31, 1933 Today's Date: 11/25/2021   History of Present Illness 88 yo female presenting 1/29 from [facility] with L-sided chest pain. Work up revealed acute PE and pt started on Eliquis. PMH includes: arthritis, trigeminal neuralgia, depression, dyslipidemia, HTN, and bilateral TKA.   Clinical Impression   PTA, pt was living alone at ILF and was independent with ADLs, IADLs, and driving. Pt and family report pt can stay at pt's daughter home at dc for increased support. Pt currently requiring Min guard A for LB ADLs and functional mobility with RW. SpO2 >90% on 1L O2 while performing activity. Pt would benefit from further acute OT to facilitate safe dc. Recommend dc to home with HHOT for further OT to optimize safety, independence with ADLs, and return to PLOF.      Recommendations for follow up therapy are one component of a multi-disciplinary discharge planning process, led by the attending physician.  Recommendations may be updated based on patient status, additional functional criteria and insurance authorization.   Follow Up Recommendations  Home health OT    Assistance Recommended at Discharge Frequent or constant Supervision/Assistance  Patient can return home with the following A little help with walking and/or transfers;A little help with bathing/dressing/bathroom;Assistance with cooking/housework    Functional Status Assessment  Patient has had a recent decline in their functional status and demonstrates the ability to make significant improvements in function in a reasonable and predictable amount of time.  Equipment Recommendations  None recommended by OT    Recommendations for Other Services       Precautions / Restrictions Precautions Precautions: Fall Precaution Comments: 1-2L O2 for mobility      Mobility Bed Mobility Overal bed  mobility: Needs Assistance Bed Mobility: Supine to Sit, Sit to Supine     Supine to sit: Supervision Sit to supine: Supervision   General bed mobility comments: increased time and effort    Transfers Overall transfer level: Needs assistance Equipment used: None Transfers: Sit to/from Stand Sit to Stand: Min guard           General transfer comment: minG for safety, pt completed x10 within session with no DME      Balance Overall balance assessment: Needs assistance Sitting-balance support: No upper extremity supported, Feet supported Sitting balance-Leahy Scale: Good     Standing balance support: Single extremity supported, During functional activity, No upper extremity supported Standing balance-Leahy Scale: Fair Standing balance comment: Able to maintain static standing                           ADL either performed or assessed with clinical judgement   ADL Overall ADL's : Needs assistance/impaired Eating/Feeding: Set up;Sitting   Grooming: Oral care;Supervision/safety;Set up;Standing   Upper Body Bathing: Supervision/ safety;Set up;Sitting   Lower Body Bathing: Min guard;Sit to/from stand   Upper Body Dressing : Supervision/safety;Set up;Sitting   Lower Body Dressing: Min guard;Sit to/from stand   Toilet Transfer: Min guard;Ambulation;Rolling walker (2 wheels)           Functional mobility during ADLs: Min guard;Rolling walker (2 wheels) General ADL Comments: Pt presenting with decreased strength and activity tolerance. Very motivated to partcipate in therapy and get OOB     Vision         Perception     Praxis      Pertinent Vitals/Pain Pain Assessment Pain Assessment: Faces  Faces Pain Scale: Hurts a little bit Pain Location: L chest Pain Descriptors / Indicators: Discomfort, Guarding Pain Intervention(s): Monitored during session, Repositioned     Hand Dominance Right   Extremity/Trunk Assessment Upper Extremity  Assessment Upper Extremity Assessment: LUE deficits/detail LUE Deficits / Details: limited by pain in chest with shoulder flex or abduction, pt able to use functionally LUE Sensation: WNL LUE Coordination: WNL   Lower Extremity Assessment Lower Extremity Assessment: Generalized weakness   Cervical / Trunk Assessment Cervical / Trunk Assessment: Normal   Communication Communication Communication: No difficulties   Cognition Arousal/Alertness: Awake/alert Behavior During Therapy: WFL for tasks assessed/performed Overall Cognitive Status: Within Functional Limits for tasks assessed                                 General Comments: increased time     General Comments  SpO2 92-93 on 1L O2 during activity.    Exercises     Shoulder Instructions      Home Living Family/patient expects to be discharged to:: Private residence Living Arrangements: Alone (Independent Living Facility) Available Help at Discharge: Family;Available 24 hours/day (daughter will be working from home) Type of Home: House Home Access: Level entry     Home Layout: One level     Bathroom Shower/Tub: Tub/shower unit;Walk-in shower   Bathroom Toilet: Handicapped height     Home Equipment: Cane - single point;Shower seat   Additional Comments: pt lives in handicap accessible apt at independent living, plans to d/c to daughters house (described above)      Prior Functioning/Environment Prior Level of Function : Independent/Modified Independent;Driving             Mobility Comments: attends OP therapy 2x/week. no use of DME despite some falls in past ADLs Comments: Reports she was performing ADLs and light IADLs. Driving        OT Problem List: Decreased strength;Decreased activity tolerance;Impaired balance (sitting and/or standing);Decreased knowledge of precautions;Decreased knowledge of use of DME or AE      OT Treatment/Interventions: Self-care/ADL training;Therapeutic  exercise;Energy conservation;DME and/or AE instruction;Therapeutic activities;Patient/family education    OT Goals(Current goals can be found in the care plan section) Acute Rehab OT Goals Patient Stated Goal: Go home OT Goal Formulation: With patient/family Time For Goal Achievement: 12/09/21 Potential to Achieve Goals: Good  OT Frequency: Min 3X/week    Co-evaluation              AM-PAC OT "6 Clicks" Daily Activity     Outcome Measure Help from another person eating meals?: None Help from another person taking care of personal grooming?: A Little Help from another person toileting, which includes using toliet, bedpan, or urinal?: A Little Help from another person bathing (including washing, rinsing, drying)?: A Little Help from another person to put on and taking off regular upper body clothing?: None Help from another person to put on and taking off regular lower body clothing?: A Little 6 Click Score: 20   End of Session Equipment Utilized During Treatment: Rolling walker (2 wheels);Oxygen Nurse Communication: Mobility status  Activity Tolerance: Patient tolerated treatment well Patient left: in chair;with call bell/phone within reach;with family/visitor present  OT Visit Diagnosis: Other abnormalities of gait and mobility (R26.89);Unsteadiness on feet (R26.81);Muscle weakness (generalized) (M62.81);Pain Pain - Right/Left: Left Pain - part of body:  (Chest)                Time: 7829-5621  OT Time Calculation (min): 27 min Charges:  OT General Charges $OT Visit: 1 Visit OT Evaluation $OT Eval Moderate Complexity: 1 Mod OT Treatments $Self Care/Home Management : 8-22 mins  Sheryl Saintil MSOT, OTR/L Acute Rehab Pager: [phone] Office: [phone]  Theodoro GristCharis M Ahlayah Tarkowski 11/25/2021, 1:29 PM

## 2021-11-26 ENCOUNTER — Inpatient Hospital Stay (HOSPITAL_COMMUNITY): Payer: Medicare Other

## 2021-11-26 ENCOUNTER — Other Ambulatory Visit (HOSPITAL_COMMUNITY): Payer: Self-pay

## 2021-11-26 DIAGNOSIS — G894 Chronic pain syndrome: Secondary | ICD-10-CM

## 2021-11-26 DIAGNOSIS — J9601 Acute respiratory failure with hypoxia: Secondary | ICD-10-CM | POA: Diagnosis present

## 2021-11-26 DIAGNOSIS — I2699 Other pulmonary embolism without acute cor pulmonale: Secondary | ICD-10-CM | POA: Diagnosis not present

## 2021-11-26 DIAGNOSIS — G8929 Other chronic pain: Secondary | ICD-10-CM | POA: Diagnosis present

## 2021-11-26 DIAGNOSIS — Z882 Allergy status to sulfonamides status: Secondary | ICD-10-CM | POA: Diagnosis not present

## 2021-11-26 DIAGNOSIS — N1832 Chronic kidney disease, stage 3b: Secondary | ICD-10-CM

## 2021-11-26 DIAGNOSIS — K219 Gastro-esophageal reflux disease without esophagitis: Secondary | ICD-10-CM | POA: Diagnosis present

## 2021-11-26 DIAGNOSIS — M19011 Primary osteoarthritis, right shoulder: Secondary | ICD-10-CM | POA: Diagnosis not present

## 2021-11-26 DIAGNOSIS — R911 Solitary pulmonary nodule: Secondary | ICD-10-CM | POA: Diagnosis present

## 2021-11-26 DIAGNOSIS — I129 Hypertensive chronic kidney disease with stage 1 through stage 4 chronic kidney disease, or unspecified chronic kidney disease: Secondary | ICD-10-CM | POA: Diagnosis present

## 2021-11-26 DIAGNOSIS — I2510 Atherosclerotic heart disease of native coronary artery without angina pectoris: Secondary | ICD-10-CM | POA: Diagnosis present

## 2021-11-26 DIAGNOSIS — Z66 Do not resuscitate: Secondary | ICD-10-CM | POA: Diagnosis present

## 2021-11-26 DIAGNOSIS — M19012 Primary osteoarthritis, left shoulder: Secondary | ICD-10-CM | POA: Diagnosis not present

## 2021-11-26 DIAGNOSIS — I10 Essential (primary) hypertension: Secondary | ICD-10-CM

## 2021-11-26 DIAGNOSIS — F32A Depression, unspecified: Secondary | ICD-10-CM | POA: Diagnosis present

## 2021-11-26 DIAGNOSIS — R079 Chest pain, unspecified: Secondary | ICD-10-CM | POA: Diagnosis not present

## 2021-11-26 DIAGNOSIS — Z79899 Other long term (current) drug therapy: Secondary | ICD-10-CM | POA: Diagnosis not present

## 2021-11-26 DIAGNOSIS — E782 Mixed hyperlipidemia: Secondary | ICD-10-CM | POA: Diagnosis not present

## 2021-11-26 DIAGNOSIS — E785 Hyperlipidemia, unspecified: Secondary | ICD-10-CM | POA: Diagnosis present

## 2021-11-26 DIAGNOSIS — Z880 Allergy status to penicillin: Secondary | ICD-10-CM | POA: Diagnosis not present

## 2021-11-26 DIAGNOSIS — Z96653 Presence of artificial knee joint, bilateral: Secondary | ICD-10-CM | POA: Diagnosis present

## 2021-11-26 DIAGNOSIS — G500 Trigeminal neuralgia: Secondary | ICD-10-CM | POA: Diagnosis present

## 2021-11-26 DIAGNOSIS — Z20822 Contact with and (suspected) exposure to covid-19: Secondary | ICD-10-CM | POA: Diagnosis present

## 2021-11-26 DIAGNOSIS — I2693 Single subsegmental pulmonary embolism without acute cor pulmonale: Secondary | ICD-10-CM | POA: Diagnosis present

## 2021-11-26 DIAGNOSIS — R0789 Other chest pain: Secondary | ICD-10-CM | POA: Diagnosis present

## 2021-11-26 DIAGNOSIS — Z888 Allergy status to other drugs, medicaments and biological substances status: Secondary | ICD-10-CM | POA: Diagnosis not present

## 2021-11-26 DIAGNOSIS — D509 Iron deficiency anemia, unspecified: Secondary | ICD-10-CM | POA: Diagnosis present

## 2021-11-26 LAB — IRON AND TIBC
Iron: 24 ug/dL — ABNORMAL LOW (ref 28–170)
Saturation Ratios: 12 % (ref 10.4–31.8)
TIBC: 199 ug/dL — ABNORMAL LOW (ref 250–450)
UIBC: 175 ug/dL

## 2021-11-26 LAB — BASIC METABOLIC PANEL
Anion gap: 8 (ref 5–15)
BUN: 16 mg/dL (ref 8–23)
CO2: 27 mmol/L (ref 22–32)
Calcium: 8.3 mg/dL — ABNORMAL LOW (ref 8.9–10.3)
Chloride: 99 mmol/L (ref 98–111)
Creatinine, Ser: 1.16 mg/dL — ABNORMAL HIGH (ref 0.44–1.00)
GFR, Estimated: 46 mL/min — ABNORMAL LOW (ref >60)
Glucose, Bld: 85 mg/dL (ref 70–99)
Potassium: 4.3 mmol/L (ref 3.5–5.1)
Sodium: 134 mmol/L — ABNORMAL LOW (ref 135–145)

## 2021-11-26 LAB — OCCULT BLOOD X 1 CARD TO LAB, STOOL: Fecal Occult Bld: NEGATIVE

## 2021-11-26 LAB — CBC
HCT: 28.6 % — ABNORMAL LOW (ref 36.0–46.0)
Hemoglobin: 9.5 g/dL — ABNORMAL LOW (ref 12.0–15.0)
MCH: 31 pg (ref 26.0–34.0)
MCHC: 33.2 g/dL (ref 30.0–36.0)
MCV: 93.5 fL (ref 80.0–100.0)
Platelets: 167 10*3/uL (ref 150–400)
RBC: 3.06 MIL/uL — ABNORMAL LOW (ref 3.87–5.11)
RDW: 11.6 % (ref 11.5–15.5)
WBC: 5.5 10*3/uL (ref 4.0–10.5)
nRBC: 0 % (ref 0.0–0.2)

## 2021-11-26 LAB — FOLATE: Folate: 19.9 ng/mL (ref >5.9)

## 2021-11-26 LAB — RETICULOCYTES
Immature Retic Fract: 9.9 % (ref 2.3–15.9)
RBC.: 3.06 MIL/uL — ABNORMAL LOW (ref 3.87–5.11)
Retic Count, Absolute: 41.9 10*3/uL (ref 19.0–186.0)
Retic Ct Pct: 1.4 % (ref 0.4–3.1)

## 2021-11-26 LAB — VITAMIN B12: Vitamin B-12: 650 pg/mL (ref 180–914)

## 2021-11-26 LAB — FERRITIN: Ferritin: 147 ng/mL (ref 11–307)

## 2021-11-26 MED ORDER — DABIGATRAN ETEXILATE MESYLATE 150 MG PO CAPS
150.0000 mg | ORAL_CAPSULE | Freq: Two times a day (BID) | ORAL | Status: DC
  Administered 2021-11-26: 150 mg via ORAL
  Filled 2021-11-26 (×2): qty 1

## 2021-11-26 MED ORDER — SODIUM CHLORIDE 0.9 % IV SOLN
250.0000 mg | Freq: Once | INTRAVENOUS | Status: AC
  Administered 2021-11-26: 250 mg via INTRAVENOUS
  Filled 2021-11-26: qty 20

## 2021-11-26 MED ORDER — DOCUSATE SODIUM 100 MG PO CAPS
100.0000 mg | ORAL_CAPSULE | Freq: Two times a day (BID) | ORAL | 2 refills | Status: AC
  Filled 2021-11-26: qty 60, 30d supply, fill #0

## 2021-11-26 MED ORDER — POLYETHYLENE GLYCOL 3350 17 GM/SCOOP PO POWD
17.0000 g | Freq: Every day | ORAL | 0 refills | Status: AC | PRN
  Filled 2021-11-26: qty 238, 14d supply, fill #0

## 2021-11-26 MED ORDER — FERROUS SULFATE 325 (65 FE) MG PO TABS
325.0000 mg | ORAL_TABLET | Freq: Every day | ORAL | 2 refills | Status: DC
  Filled 2021-11-26: qty 30, 30d supply, fill #0

## 2021-11-26 MED ORDER — DABIGATRAN ETEXILATE MESYLATE 150 MG PO CAPS
150.0000 mg | ORAL_CAPSULE | Freq: Two times a day (BID) | ORAL | 2 refills | Status: DC
  Filled 2021-11-26: qty 60, 30d supply, fill #0

## 2021-11-26 NOTE — Discharge Summary (Signed)
Physician Discharge Summary   Patient: Kristen [name] MRN: [id number] DOB: 1932/12/04  Admit date:     11/23/2021  Discharge date: 11/26/21  Discharge Physician: Janara Klett J [place]   PCP: Kathalene Frames, MD   Recommendations at discharge:   Follow-up with PCP in 7-10 days following hospitalization Started on Pradaxa for pulmonary embolism, would benefit from discontinuation of primidone as this limits her anticoagulation options. Will need surveillance of pulmonary nodule, incidental finding on CT angiogram chest. Recommend repeat CBC and BMP 1 week  Discharge Diagnoses: Principal Problem:   Acute pulmonary embolism without acute cor pulmonale (HCC) Active Problems:   Hyperlipidemia   Essential hypertension   Pulmonary nodule, right   Chronic pain disorder   Stage 3b chronic kidney disease (CKD) ([place])   DNR (do not resuscitate)   Pulmonary embolism ([place])  Resolved Problems:   * No resolved hospital problems. *   Hospital Course:  Kristen [name] is a 88 year old female with past medical history significant for seizure disorder, depression, essential hypertension, hyperlipidemia, chronic pain who presented to [facility] ED on 1/29 with left-sided chest pain.  Evaluation in the ED notable for pulmonary embolism.  No previous history of DVT.  Patient was placed on IV heparin drip.  Hospitalist service consulted for further evaluation and management.     Assessment and Plan:   Acute pulmonary embolism without acute cor pulmonale, POA Patient presenting to ED with left-sided chest pain.  CT angiogram chest with nonocclusive distal left central PE extending to left upper/lower segmental levels. Patient was initially requiring supplemental oxygen which was weaned off.  No desaturations on ambulation at time of discharge.  Unfortunately, patient currently on primidone which limits her anticoagulation options.  Discussion with patient's daughter, patient's PCP Dr. Koleen Nimrod regarding plan to  start Pradaxa given DOAC's contraindicated and family refused warfarin and likely patient unable to tolerate Lovenox injections with also cost is a consideration.  Recommend close outpatient follow-up with PCP, Dr. Koleen Nimrod.  Likely would benefit from stopping primidone and start a DOAC.  CKD stage IIIb Creatinine 1.39 on presentation and peaked at 1.40 during hospitalization.  Creatinine 1.16 at time of discharge.  Recommend repeat BMP 1 week.  Resumed home losartan.  Chronic pain disorder Continue home Dilaudid 2 mg every 8 hours as needed, gabapentin 300 mg p.o. daily, Mobic; duloxetine.  Essential hypertension Losartan 50 mg p.o. daily, Bystolic 5 mg p.o. daily.  Hyperlipidemia Continue home atorvastatin 20 mg p.o. daily.  GERD: Nexium 40 mg p.o. daily  Iron deficiency anemia: Anemia panel with iron 24, ferritin 147, TIBC 199, folate 19.9, B12 650.  Patient received IV iron during hospitalization.  Starting on ferrous sulfate 325 mg p.o. daily.  Recommend repeat CBC 1 week.  Hemoglobin 9.5 at time of discharge.  Pulmonary nodule CT angiogram chest with incidental finding of a 2.2 x 2.8 cm right upper lobe solid and groundglass airspace opacity concerning for inflammation versus malignancy versus infection.  Patient is afebrile without leukocytosis.  Recommend repeat CT chest in 6-12 months for further surveillance.         Consultants: None Procedures performed: None Disposition: Relative's home Diet recommendation:  Discharge Diet Orders (From admission, onward)     Start     Ordered   11/26/21 0000  Diet - low sodium heart healthy        11/26/21 1154           Cardiac diet  DISCHARGE MEDICATION: Allergies as of 11/26/2021  Reactions   Penicillins Hives, Rash   Other reaction(s): Other unknown   Tetracyclines & Related Anaphylaxis   Other reaction(s): Other unknown   Sulfa Antibiotics Hives, Other (See Comments)   unknown        Medication List      TAKE these medications    atorvastatin 20 MG tablet Commonly known as: LIPITOR Take 20 mg by mouth daily.   cyanocobalamin 1000 MCG/ML injection Commonly known as: (VITAMIN B-12) Inject 100 mcg into the muscle every 30 (thirty) days.   dabigatran 150 MG Caps capsule Commonly known as: PRADAXA Take 1 capsule (150 mg total) by mouth every 12 (twelve) hours.   diclofenac Sodium 1 % Gel Commonly known as: VOLTAREN Apply 2 g topically daily. For knee pain   docusate sodium 100 MG capsule Commonly known as: COLACE Take 1 capsule (100 mg total) by mouth 2 (two) times daily.   DULoxetine 30 MG capsule Commonly known as: CYMBALTA Take 30 mg by mouth at bedtime. What changed: Another medication with the same name was removed. Continue taking this medication, and follow the directions you see here.   esomeprazole 40 MG capsule Commonly known as: NEXIUM Take 40 mg by mouth daily.   ferrous sulfate 325 (65 FE) MG tablet Take 1 tablet (325 mg total) by mouth daily.   gabapentin 300 MG capsule Commonly known as: NEURONTIN Take 300 mg by mouth daily.   HYDROmorphone 2 MG tablet Commonly known as: DILAUDID Take 2 mg by mouth in the morning and at bedtime.   losartan 50 MG tablet Commonly known as: COZAAR Take 50 mg by mouth daily.   meloxicam 15 MG tablet Commonly known as: MOBIC Take 15 mg by mouth daily.   nebivolol 5 MG tablet Commonly known as: BYSTOLIC Take 5 mg by mouth daily.   polyethylene glycol 17 g packet Commonly known as: MIRALAX / GLYCOLAX Take 17 g by mouth daily as needed for mild constipation.   primidone 50 MG tablet Commonly known as: MYSOLINE Take 50 mg by mouth at bedtime.   traZODone 100 MG tablet Commonly known as: DESYREL Take 100 mg by mouth at bedtime.               Durable Medical Equipment  (From admission, onward)           Start     Ordered   11/26/21 1015  For home use only DME Walker rolling  Once       Question  Answer Comment  Walker: With [place] Wheels   Patient needs a walker to treat with the following condition General weakness      11/26/21 1014   11/26/21 1015  For home use only DME Bedside commode  Once       Question:  Patient needs a bedside commode to treat with the following condition  Answer:  General weakness   11/26/21 1014            Follow-up Information     [facility] Follow up.   Why: Bedside commode, Rolling walker- to be delivered to the room prior to transition home. Contact information: [place] [address] [phone]         Kathalene Frames, MD. Schedule an appointment as soon as possible for a visit in 1 week(s).   Specialty: Internal Medicine Contact information: [address], [address] [place] [address] [phone]  Discharge Exam: Filed Weights   11/23/21 1522  Weight: 66.4 kg   Physical Exam GEN: 88 yo female in NAD, alert and oriented x 3, wd/wn, elderly in appearance HEENT: NCAT, PERRL, EOMI, sclera clear, MMM PULM: CTAB w/o wheezes/crackles, normal respiratory effort, on room air at rest CV: RRR w/o M/G/R, no JVD GI: abd soft, NTND, NABS, no R/G/M MSK: no peripheral edema, muscle strength globally intact 5/5 bilateral upper/lower extremities NEURO: CN II-XII intact, no focal deficits, sensation to light touch intact PSYCH: normal mood/affect Integumentary: dry/intact, no rashes or wounds   Condition at discharge: stable  The results of significant diagnostics from this hospitalization (including imaging, microbiology, ancillary and laboratory) are listed below for reference.   Imaging Studies: DG Chest Port 1 View  Result Date: 11/23/2021 CLINICAL DATA:  Chest pain. EXAM: PORTABLE CHEST 1 VIEW COMPARISON:  None. FINDINGS: The heart size and mediastinal contours are within normal limits. Right lung is clear. Elevated left hemidiaphragm is noted with minimal left  basilar atelectasis. The visualized skeletal structures are unremarkable. IMPRESSION: Elevated left hemidiaphragm is noted with minimal left basilar atelectasis. Electronically Signed   By: Marijo Conception M.D.   On: 11/23/2021 14:15   CT Angio Chest/Abd/Pel for Dissection W and/or Wo Contrast  Result Date: 11/23/2021 CLINICAL DATA:  Chest pain or back pain, aortic dissection suspected EXAM: CT ANGIOGRAPHY CHEST, ABDOMEN AND PELVIS TECHNIQUE: Non-contrast CT of the chest was initially obtained. Multidetector CT imaging through the chest, abdomen and pelvis was performed using the standard protocol during bolus administration of intravenous contrast. Multiplanar reconstructed images and MIPs were obtained and reviewed to evaluate the vascular anatomy. RADIATION DOSE REDUCTION: This exam was performed according to the departmental dose-optimization program which includes automated exposure control, adjustment of the mA and/or kV according to patient size and/or use of iterative reconstruction technique. CONTRAST:  63mL OMNIPAQUE IOHEXOL 350 MG/ML SOLN COMPARISON:  Chest x-ray 11/23/2021 FINDINGS: CTA CHEST FINDINGS Cardiovascular: Satisfactory opacification of the pulmonary arteries to the segmental level. Nonocclusive distal central left pulmonary embolus extending to the left upper and left lower segmental levels. Normal heart size. No significant pericardial effusion. The thoracic aorta is normal in caliber. No definite aortic dissection. Mild atherosclerotic plaque of the thoracic aorta. At least 2 vessel coronary artery calcifications. Mediastinum/Nodes: No enlarged mediastinal, hilar, or axillary lymph nodes. Thyroid gland, trachea, and esophagus demonstrate no significant findings. Elevated left hemidiaphragm. Lungs/Pleura: Right upper lobe centrally solid and peripherally ground-glass airspace opacity with the solid portions appearing nodular-like measuring 2.2 x 2.8 cm (8:28). Subpleural left upper lobe  pulmonary micronodule (8:29). Subpleural left apical calcified pulmonary micronodule (8:14). Pulmonary micronodule within the right middle lobe (8:55). No pulmonary mass. No pleural effusion. No pneumothorax. Musculoskeletal: No chest wall abnormality. No suspicious lytic or blastic osseous lesions. No acute displaced fracture. Old healed left rib fractures. Multilevel degenerative changes of the spine. T11 vertebral body hemangioma. Review of the MIP images confirms the above findings. CTA ABDOMEN AND PELVIS FINDINGS VASCULAR Aorta: Severe atherosclerotic plaque. Normal caliber aorta without aneurysm, dissection, vasculitis or significant stenosis. Celiac: Patent without evidence of aneurysm, dissection, vasculitis or significant stenosis. SMA: Patent without evidence of aneurysm, dissection, vasculitis or significant stenosis. Renals: Mild to moderate atherosclerotic plaque at the origin of the renal arteries. Both renal arteries are patent without evidence of aneurysm, dissection, vasculitis, fibromuscular dysplasia or significant stenosis. IMA: Patent without evidence of aneurysm, dissection, vasculitis or significant stenosis. Inflow: Patent without evidence of aneurysm, dissection, vasculitis or significant  stenosis. Veins: No obvious venous abnormality within the limitations of this arterial phase study. Review of the MIP images confirms the above findings. NON-VASCULAR Hepatobiliary: No focal liver abnormality. Status post cholecystectomy. No biliary dilatation. Pancreas: No focal lesion. Normal pancreatic contour. No surrounding inflammatory changes. No main pancreatic ductal dilatation. Spleen: Normal in size without focal abnormality. Adrenals/Urinary Tract: No adrenal nodule bilaterally. Bilateral kidneys enhance symmetrically. Subcentimeter hypodensities are too small to characterize. Couple of fluid density lesions within bilateral kidneys likely represent simple renal cysts. Couple of calcifications  adjacent to a simple renal cyst within the inferior pole of right kidney measuring up to 6 mm. No hydronephrosis. No hydroureter. The urinary bladder is unremarkable. Stomach/Bowel: Stomach is within normal limits. No evidence of bowel wall thickening or dilatation. The appendix is not definitely identified with no inflammatory changes in the right lower quadrant to suggest acute appendicitis. Lymphatic: No lymphadenopathy. Reproductive: Status post hysterectomy. No adnexal masses. Other: No intraperitoneal free fluid. No intraperitoneal free gas. No organized fluid collection. Musculoskeletal: No abdominal wall hernia or abnormality. No suspicious lytic or blastic osseous lesions. No acute displaced fracture. Multilevel degenerative changes of the spine. Grade 1 anterolisthesis of L4 on L5. Review of the MIP images confirms the above findings. IMPRESSION: 1. Nonocclusive distal central left pulmonary embolus extending to the left upper and left lower segmental levels. No heart strain or pulmonary infarction. 2. A 2.2 x 2.8 cm right upper lobe solid and ground-glass airspace opacity that is nodular-like centrally. Finding could represent infection versus inflammation versus malignancy. Additional imaging evaluation or consultation with Pulmonology or Thoracic Surgery recommended. 3. No acute aortic abnormality. Aortic Atherosclerosis (ICD10-I70.0). 4. Trace left pleural effusion. 5. Nonobstructive right nephrolithiasis measuring up to 6 mm. 6. Other imaging findings of potential clinical significance: Several bilateral scattered pulmonary micronodules within the lungs some calcified and some not. No follow-up needed if patient is low-risk (and has no known or suspected primary neoplasm). Non-contrast chest CT can be considered in 12 months if patient is high-risk. This recommendation follows the consensus statement: Guidelines for Management of Incidental Pulmonary Nodules Detected on CT Images: From the Fleischner  Society 2017; Radiology 2017; 284:228-243. These results were called by telephone at the time of interpretation on 11/23/2021 at 3:05 pm to provider DAN FLOYD , who verbally acknowledged these results. Electronically Signed   By: Iven Finn M.D.   On: 11/23/2021 15:22   ECHOCARDIOGRAM LIMITED  Result Date: 11/24/2021    ECHOCARDIOGRAM LIMITED REPORT   Patient Name:   Halyn Kurdziel Date of Exam: 11/24/2021 Medical Rec #:  [id number]    Height:       64.0 in Accession #:    [id number]   Weight:       146.4 lb Date of Birth:  1933/05/07    BSA:          1.713 m Patient Age:    41 years     BP:           156/68 mmHg Patient Gender: F            HR:           69 bpm. Exam Location:  Inpatient Procedure: 2D Echo, Limited Echo and Limited Color Doppler Indications:    I26.09 PULM. EMBOLUS  History:        Patient has prior history of Echocardiogram examinations, most                 recent 10/07/2021. Risk  Factors:Dyslipidemia and Hypertension.  Sonographer:    Beryle Beams Referring Phys: [place]  1. Left ventricular ejection fraction, by estimation, is 60 to 65%. The left ventricle has normal function. The left ventricle has no regional wall motion abnormalities. There is mild concentric left ventricular hypertrophy.  2. Right ventricular systolic function is normal. The right ventricular size is normal. Tricuspid regurgitation signal is inadequate for assessing PA pressure.  3. The mitral valve is normal in structure. No evidence of mitral valve regurgitation. No evidence of mitral stenosis.  4. Tricuspid valve regurgitation is mild to moderate.  5. The aortic valve is tricuspid. Aortic valve regurgitation is mild to moderate. Aortic valve sclerosis/calcification is present, without any evidence of aortic stenosis. Aortic regurgitation PHT measures 564 msec. Aortic valve area, by VTI measures 1.51 cm. Aortic valve mean gradient measures 7.0 mmHg. Aortic valve Vmax measures 1.66 m/s.  6.  The inferior vena cava is normal in size with greater than 50% respiratory variability, suggesting right atrial pressure of 3 mmHg. Comparison(s): No prior Echocardiogram. FINDINGS  Left Ventricle: Left ventricular ejection fraction, by estimation, is 60 to 65%. The left ventricle has normal function. The left ventricle has no regional wall motion abnormalities. The left ventricular internal cavity size was normal in size. There is  mild concentric left ventricular hypertrophy. Right Ventricle: The right ventricular size is normal. No increase in right ventricular wall thickness. Right ventricular systolic function is normal. Tricuspid regurgitation signal is inadequate for assessing PA pressure. Left Atrium: Left atrial size was normal in size. Right Atrium: Right atrial size was normal in size. Pericardium: There is no evidence of pericardial effusion. Mitral Valve: The mitral valve is normal in structure. No evidence of mitral valve stenosis. Tricuspid Valve: The tricuspid valve is normal in structure. Tricuspid valve regurgitation is mild to moderate. No evidence of tricuspid stenosis. Aortic Valve: The aortic valve is tricuspid. Aortic valve regurgitation is mild to moderate. Aortic regurgitation PHT measures 564 msec. Aortic valve sclerosis/calcification is present, without any evidence of aortic stenosis. Aortic valve mean gradient measures 7.0 mmHg. Aortic valve peak gradient measures 11.0 mmHg. Aortic valve area, by VTI measures 1.51 cm. Pulmonic Valve: The pulmonic valve was normal in structure. Pulmonic valve regurgitation is trivial. No evidence of pulmonic stenosis. Aorta: The aortic root is normal in size and structure. Venous: The inferior vena cava is normal in size with greater than 50% respiratory variability, suggesting right atrial pressure of 3 mmHg. IAS/Shunts: No atrial level shunt detected by color flow Doppler. LEFT VENTRICLE PLAX 2D LVIDd:         3.70 cm LVIDs:         2.10 cm LV PW:          1.10 cm LV IVS:        1.00 cm LVOT diam:     1.60 cm LV SV:         54 LV SV Index:   32 LVOT Area:     2.01 cm  LV Volumes (MOD) LV vol d, MOD A2C: 56.9 ml LV vol d, MOD A4C: 61.4 ml LV vol s, MOD A2C: 18.7 ml LV vol s, MOD A4C: 20.4 ml LV SV MOD A2C:     38.2 ml LV SV MOD A4C:     61.4 ml LV SV MOD BP:      39.4 ml RIGHT VENTRICLE             IVC RV Basal diam:  4.10 cm     IVC diam: 1.20 cm RV Mid diam:    3.70 cm RV S prime:     16.60 cm/s RVOT diam:      2.20 cm TAPSE (M-mode): 1.4 cm LEFT ATRIUM         Index       RIGHT ATRIUM           Index LA diam:    3.40 cm 1.98 cm/m  RA Area:     10.80 cm                                 RA Volume:   22.60 ml  13.19 ml/m  AORTIC VALVE                     PULMONIC VALVE AV Area (Vmax):    1.34 cm      PV Vmax:       0.52 m/s AV Area (Vmean):   1.31 cm      PV Vmean:      34.400 cm/s AV Area (VTI):     1.51 cm      PV VTI:        0.107 m AV Vmax:           166.00 cm/s   PV Peak grad:  1.1 mmHg AV Vmean:          121.000 cm/s  PV Mean grad:  1.0 mmHg AV VTI:            0.360 m AV Peak Grad:      11.0 mmHg AV Mean Grad:      7.0 mmHg LVOT Vmax:         111.00 cm/s LVOT Vmean:        79.000 cm/s LVOT VTI:          0.271 m LVOT/AV VTI ratio: 0.75 AI PHT:            564 msec  AORTA Ao Root diam: 2.80 cm Ao Asc diam:  2.70 cm TRICUSPID VALVE TV Peak grad:   39.2 mmHg TV Mean grad:   31.0 mmHg TV Vmax:        3.13 m/s TV Vmean:       272.0 cm/s TV VTI:         1.02 msec  SHUNTS Systemic VTI:  0.27 m Systemic Diam: 1.60 cm Pulmonic Diam: 2.20 cm Kardie Tobb DO Electronically signed by Berniece Salines DO Signature Date/Time: 11/24/2021/10:48:16 AM    Final     Microbiology: Results for orders placed or performed during the hospital encounter of 11/23/21  Resp Panel by RT-PCR (Flu A&B, Covid) Nasopharyngeal Swab     Status: None   Collection Time: 11/23/21  3:16 PM   Specimen: Nasopharyngeal Swab; Nasopharyngeal(NP) swabs in vial transport medium  Result Value Ref Range  Status   SARS Coronavirus 2 by RT PCR NEGATIVE NEGATIVE Final    Comment: (NOTE) SARS-CoV-2 target nucleic acids are NOT DETECTED.  The SARS-CoV-2 RNA is generally detectable in upper respiratory specimens during the acute phase of infection. The lowest concentration of SARS-CoV-2 viral copies this assay can detect is 138 copies/mL. A negative result does not preclude SARS-Cov-2 infection and should not be used as the sole basis for treatment or other patient management decisions. A negative result may occur with  improper specimen collection/handling, submission of specimen other  than nasopharyngeal swab, presence of viral mutation(s) within the areas targeted by this assay, and inadequate number of viral copies(<138 copies/mL). A negative result must be combined with clinical observations, patient history, and epidemiological information. The expected result is Negative.  Fact Sheet for Patients:  EntrepreneurPulse.com.au  Fact Sheet for Healthcare Providers:  IncredibleEmployment.be  This test is no t yet approved or cleared by the [place] FDA and  has been authorized for detection and/or diagnosis of SARS-CoV-2 by FDA under an Emergency Use Authorization (EUA). This EUA will remain  in effect (meaning this test can be used) for the duration of the COVID-19 declaration under Section 564(b)(1) of the Act, 21 U.S.C.section 360bbb-3(b)(1), unless the authorization is terminated  or revoked sooner.       Influenza A by PCR NEGATIVE NEGATIVE Final   Influenza B by PCR NEGATIVE NEGATIVE Final    Comment: (NOTE) The Xpert Xpress SARS-CoV-2/FLU/RSV plus assay is intended as an aid in the diagnosis of influenza from Nasopharyngeal swab specimens and should not be used as a sole basis for treatment. Nasal washings and aspirates are unacceptable for Xpert Xpress SARS-CoV-2/FLU/RSV testing.  Fact Sheet for  Patients: EntrepreneurPulse.com.au  Fact Sheet for Healthcare Providers: IncredibleEmployment.be  This test is not yet approved or cleared by the [place] FDA and has been authorized for detection and/or diagnosis of SARS-CoV-2 by FDA under an Emergency Use Authorization (EUA). This EUA will remain in effect (meaning this test can be used) for the duration of the COVID-19 declaration under Section 564(b)(1) of the Act, 21 U.S.C. section 360bbb-3(b)(1), unless the authorization is terminated or revoked.  Performed at [facility], [place] [address] [place], [place] [address]     Labs: CBC: Recent Labs  Lab 11/23/21 1340 11/23/21 1406 11/24/21 0327 11/25/21 0328 11/26/21 0344  WBC 9.9  --  10.1 6.9 5.5  NEUTROABS 7.0  --   --   --   --   HGB 12.2 12.9 10.2* 9.7* 9.5*  HCT 37.2 38.0 30.0* 29.3* 28.6*  MCV 96.4  --  94.9 94.5 93.5  PLT 196  --  147* 149* [id number]   Basic Metabolic Panel: Recent Labs  Lab 11/23/21 1340 11/23/21 1406 11/24/21 0327 11/25/21 0328 11/26/21 0344  NA 135 136 133* 134* 134*  K 4.5 4.5 4.1 4.4 4.3  CL 99 99 100 100 99  CO2 27  --  25 29 27   GLUCOSE 116* 108* 122* 97 85  BUN 16 19 14 14 16   CREATININE 1.39* 1.40* 1.19* 1.09* 1.16*  CALCIUM 8.3*  --  8.0* 8.1* 8.3*   Liver Function Tests: Recent Labs  Lab 11/23/21 1340  AST 19  ALT 10  ALKPHOS 79  BILITOT 1.1  PROT 6.4*  ALBUMIN 3.8   CBG: No results for input(s): GLUCAP in the last 168 hours.  Discharge time spent: greater than 30 minutes.  Signed: [place] Keena J [place] DO [facility] 11/26/2021

## 2021-11-26 NOTE — Progress Notes (Signed)
Patient called nurse crying and moaning.  States that her left chest started hurting after she drank a boost.  Rating 10/10 pain with SOB.  Sats 94% on RA.  HR 60s.  Requesting pain med.  Given dilaudid IV per prn order.  Dr. [place] notified of pain.  Patient states pain easing after IV dilaudid, but still hurts to take deep breath.

## 2021-11-26 NOTE — Progress Notes (Signed)
Physical Therapy Treatment Patient Details Name: Kristen [name] MRN: [id number] DOB: 06/03/1933 Today's Date: 11/26/2021   History of Present Illness 88 yo female presenting 1/29 from [place] [facility] with L-sided chest pain. Work up revealed acute PE and pt started on Eliquis. PMH includes: arthritis, trigeminal neuralgia, depression, dyslipidemia, HTN, and bilateral TKA.    PT Comments    Pt reports dizziness at rest which she states happens at times, BP 140s/50s which is pt baseline today and reports improving dizziness with mobility. Pt ambulatory for good hallway distance without use of RW and mild unsteadiness, no overt LOB even when challenged with head turns and directional changes. Per pt, she will have daughter at home to help her at d/c. SpO2 maintained 88% and greater on RA throughout mobility, some L chest wall discomfort with exertion which pt has had since PE but settles with rest. PT to continue to follow acutely.   Of note, pt's HR registering 110s-140s bpm post-ambulation, when PT manually took pulse pt with pulse of 60 bpm.    Recommendations for follow up therapy are one component of a multi-disciplinary discharge planning process, led by the attending physician.  Recommendations may be updated based on patient status, additional functional criteria and insurance authorization.  Follow Up Recommendations  Home health PT     Assistance Recommended at Discharge    Patient can return home with the following A little help with walking and/or transfers;A little help with bathing/dressing/bathroom;Assistance with cooking/housework;Direct supervision/assist for medications management;Assist for transportation;Help with stairs or ramp for entrance   Equipment Recommendations  Rolling walker (2 wheels);BSC/3in1    Recommendations for Other Services       Precautions / Restrictions Precautions Precautions: Fall Restrictions Weight Bearing  Restrictions: No     Mobility  Bed Mobility Overal bed mobility: Needs Assistance Bed Mobility: Supine to Sit, Sit to Supine     Supine to sit: Supervision Sit to supine: Supervision   General bed mobility comments: increased time and effort    Transfers Overall transfer level: Needs assistance Equipment used: None Transfers: Sit to/from Stand Sit to Stand: Supervision           General transfer comment: for safety, slow to rise and sit    Ambulation/Gait Ambulation/Gait assistance: Min guard Gait Distance (Feet): 200 Feet Assistive device: None Gait Pattern/deviations: Decreased stride length, Trunk flexed, Step-through pattern Gait velocity: decr     General Gait Details: close guard for safety, pt with intermittent unsteadiness when challenged with directional changes, head turns. cues to slow down, standing rest breaks as needed to recover L chest wall discomfort which seems exacerbated by exertion. SPO2 88-97% on RA during gait   Stairs             Wheelchair Mobility    Modified Rankin (Stroke Patients Only)       Balance Overall balance assessment: Needs assistance Sitting-balance support: No upper extremity supported, Feet supported Sitting balance-Leahy Scale: Good     Standing balance support: Single extremity supported, During functional activity, No upper extremity supported Standing balance-Leahy Scale: Fair Standing balance comment: Able to maintain static standing                            Cognition Arousal/Alertness: Awake/alert Behavior During Therapy: WFL for tasks assessed/performed Overall Cognitive Status: Within Functional Limits for tasks assessed  Exercises      General Comments        Pertinent Vitals/Pain Pain Assessment Pain Assessment: Faces Faces Pain Scale: Hurts little more Pain Location: L chest Pain Descriptors / Indicators:  Discomfort, Guarding Pain Intervention(s): Limited activity within patient's tolerance, Monitored during session, Repositioned    Home Living Family/patient expects to be discharged to:: Other (Comment) (daughter's house) Living Arrangements: Alone                      Prior Function            PT Goals (current goals can now be found in the care plan section) Acute Rehab PT Goals Patient Stated Goal: return to not using RW PT Goal Formulation: With patient Time For Goal Achievement: 12/08/21 Potential to Achieve Goals: Good Progress towards PT goals: Progressing toward goals    Frequency    Min 3X/week      PT Plan Current plan remains appropriate    Co-evaluation              AM-PAC PT "6 Clicks" Mobility   Outcome Measure  Help needed turning from your back to your side while in a flat bed without using bedrails?: A Little Help needed moving from lying on your back to sitting on the side of a flat bed without using bedrails?: A Little Help needed moving to and from a bed to a chair (including a wheelchair)?: A Little Help needed standing up from a chair using your arms (e.g., wheelchair or bedside chair)?: A Little Help needed to walk in hospital room?: A Little Help needed climbing 3-5 steps with a railing? : A Little 6 Click Score: 18    End of Session   Activity Tolerance: Patient tolerated treatment well Patient left: in bed;with call bell/phone within reach;with nursing/sitter in room (RN at bedside, PT informed RN bed alarm needs to be set) Nurse Communication: Mobility status PT Visit Diagnosis: Unsteadiness on feet (R26.81);Other abnormalities of gait and mobility (R26.89);Muscle weakness (generalized) (M62.81);Pain Pain - Right/Left: Left Pain - part of body:  (chest wall, with exertion)     Time: 4628-6381 PT Time Calculation (min) (ACUTE ONLY): 23 min  Charges:  $Gait Training: 8-22 mins                     Kristen [name], PT DPT Acute  Rehabilitation Services Pager [phone]  Office [phone]    Kristen [name] E Kristen [name] 11/26/2021, 2:10 PM

## 2021-11-26 NOTE — Progress Notes (Signed)
Patient's daughter called, expressed concern about patient's Hgb dropping and other lab tests.  Would like to discuss concerns with MD.  At this time, daughter is not sure patient is ready to d/c. Will pass concerns on to MD.

## 2021-11-26 NOTE — Progress Notes (Signed)
°  Patient Saturations on Room Air at Rest = 96%  Patient Saturations on Room Air while Ambulating = 91% Patient c/o left chest pain with deep breath and movement.  Stopped to rest x 3 while ambulating 200 feet.

## 2021-11-26 NOTE — Progress Notes (Signed)
Daughter called late night to see about physician calling her. Very concerned about patient's hgb dropping this admission especially post-IV heparin and getting lovenox. Also wanted to follow up on getting home health services. Paged Dr. Rachael Darby; he will call her now and followup with dayteam.

## 2021-11-26 NOTE — Care Management (Signed)
11-26-21 1121 Case Manager ordered durable medical equipment (DME) rolling walker and bedside commode for the patient via [employer]. [employer] will deliver equipment to the room. Patient did not qualify for home oxygen. Family to assist with transportation home once stable.

## 2021-12-04 DIAGNOSIS — N1832 Chronic kidney disease, stage 3b: Secondary | ICD-10-CM | POA: Diagnosis not present

## 2021-12-04 DIAGNOSIS — G8929 Other chronic pain: Secondary | ICD-10-CM | POA: Diagnosis not present

## 2021-12-04 DIAGNOSIS — I2699 Other pulmonary embolism without acute cor pulmonale: Secondary | ICD-10-CM | POA: Diagnosis not present

## 2021-12-04 DIAGNOSIS — F329 Major depressive disorder, single episode, unspecified: Secondary | ICD-10-CM | POA: Diagnosis not present

## 2021-12-04 DIAGNOSIS — J984 Other disorders of lung: Secondary | ICD-10-CM | POA: Diagnosis not present

## 2021-12-04 DIAGNOSIS — Z96652 Presence of left artificial knee joint: Secondary | ICD-10-CM | POA: Diagnosis not present

## 2021-12-04 DIAGNOSIS — M25561 Pain in right knee: Secondary | ICD-10-CM | POA: Diagnosis not present

## 2021-12-04 DIAGNOSIS — D509 Iron deficiency anemia, unspecified: Secondary | ICD-10-CM | POA: Diagnosis not present

## 2021-12-04 DIAGNOSIS — I272 Pulmonary hypertension, unspecified: Secondary | ICD-10-CM | POA: Diagnosis not present

## 2021-12-04 DIAGNOSIS — M25562 Pain in left knee: Secondary | ICD-10-CM | POA: Diagnosis not present

## 2021-12-04 DIAGNOSIS — Z96651 Presence of right artificial knee joint: Secondary | ICD-10-CM | POA: Diagnosis not present

## 2021-12-08 ENCOUNTER — Telehealth: Payer: Self-pay | Admitting: Emergency Medicine

## 2021-12-08 DIAGNOSIS — M6281 Muscle weakness (generalized): Secondary | ICD-10-CM | POA: Diagnosis not present

## 2021-12-09 ENCOUNTER — Other Ambulatory Visit (HOSPITAL_BASED_OUTPATIENT_CLINIC_OR_DEPARTMENT_OTHER): Payer: Self-pay

## 2021-12-09 ENCOUNTER — Other Ambulatory Visit (HOSPITAL_COMMUNITY): Payer: Self-pay

## 2021-12-09 NOTE — Telephone Encounter (Signed)
I wanted to see if you have had a scan come through for this patient that was a CT for a lung nodule that was supposed to be faxed to Dr. Delton Coombes. She is seeing him on 12/11/21. The daughter wanted me to ask if you had seen a CT from [place]. FYI.

## 2021-12-10 ENCOUNTER — Telehealth (HOSPITAL_COMMUNITY): Payer: Self-pay

## 2021-12-10 NOTE — Telephone Encounter (Signed)
Pharmacy Transitions of Care Follow-up Telephone Call  Date of discharge: 11/26/2021  Discharge Diagnosis: Acute Pulmonary Embolism  How have you been since you were released from the hospital? Spoke with patients daughter, reports patient is doing okay.    Medication changes made at discharge:  - START: Dabigatran 150 mg BID  Medication changes verified by the patient? Yes (Yes/No)   Medication Accessibility:  Home Pharmacy: [facility]    Was the patient provided with refills on discharged medications? Yes   Have all prescriptions been transferred from [facility] to home pharmacy? No   Is the patient able to afford medications? Patient has insurance   Medication Review: PRADAXA (DABIGATRAN) Dabigatran 150 mg BID initiated on 11/26/2021. - Discussed importance of taking medication around the same time everyday  - Advised patient of medications to avoid (NSAIDs, ASA)  - Educated that Tylenol (acetaminophen) will be the preferred analgesic to prevent risk of bleeding  - Emphasized importance of monitoring for signs and symptoms of bleeding (abnormal bruising, prolonged bleeding, nose bleeds, bleeding from gums, discolored urine, black tarry stools)  - Advised patient to alert all providers of anticoagulation therapy prior to starting a new medication or having a procedure   Follow-up Appointments:  [facility]u [facility]t confirmed? Pulmonology Scheduled to see Dr. Delton Coombes on 12/11/2021 @ 3:00.   If their condition worsens, is the pt aware to call PCP or go to the Emergency Dept.? Yes  Final Patient Assessment: Side effects and monitoring parameters were discussed with patients daughter. Patient uses [facility] though they have already received her prescription as patient uses a pill pack. No further questions at this time.

## 2021-12-11 ENCOUNTER — Ambulatory Visit (INDEPENDENT_AMBULATORY_CARE_PROVIDER_SITE_OTHER): Payer: Medicare Other | Admitting: Emergency Medicine

## 2021-12-11 ENCOUNTER — Other Ambulatory Visit: Payer: Self-pay

## 2021-12-11 ENCOUNTER — Encounter: Payer: Self-pay | Admitting: *Deleted

## 2021-12-11 DIAGNOSIS — R911 Solitary pulmonary nodule: Secondary | ICD-10-CM

## 2021-12-11 DIAGNOSIS — M6281 Muscle weakness (generalized): Secondary | ICD-10-CM | POA: Diagnosis not present

## 2021-12-11 NOTE — Assessment & Plan Note (Signed)
Irregular mixed density pulmonary nodule in the right upper lobe that is quite suspicious for an adenocarcinoma.  That said it could reflect infectious or inflammatory process, even possibly injured lung post pulmonary embolism.  Review of old notes reveals that she did have a right upper lobe opacity seen on chest x-ray from [facility] (I did not find any CTs) going back to 2020 which probably represents the same process.  For now we have decided to follow this lesion with serial imaging.  If it is a slow-growing adenocarcinoma we may not be compelled to act on it as long as she remains asymptomatic.  If symptoms do evolve then we could consider biopsy or even discussion with radiation oncology regarding SBRT without a tissue diagnosis.  We will plan to repeat her CT in 6 months and then follow-up to review.  If she develops symptoms we may decide to image sooner.  We will plan to repeat your CT scan of the chest without contrast in July 2023 Continue your current medication as you are taking it Follow Dr. Delton Coombes in July after your CT so we can review the results together.  Call if you begin to develop any persistent respiratory symptoms

## 2021-12-11 NOTE — Patient Instructions (Addendum)
We will obtain copies of your prior CT scans from [facility] We will plan to repeat your CT scan of the chest without contrast in July 2023 Continue your current medication as you are taking it Follow Dr. Lamonte Sakai in July after your CT so we can review the results together.  Call if you begin to develop any persistent respiratory symptoms

## 2021-12-11 NOTE — Progress Notes (Signed)
Subjective:    Patient ID: Kristen [name], female    DOB: 05-Apr-1933, 88 y.o.   MRN: [id number]  HPI 88 year old never smoker with a history of seizures, hypertension, trigeminal neuralgia, B12 deficiency.  She was recently admitted for chest pain and found to have a left segmental pulmonary embolism.  Also noted on the CT chest was a semisolid right upper lobe opacity.  CT-PA 11/23/21 reviewed by me shows an irregular mixed density right upper lobe pulmonary nodule suspicious for possible highly differentiated adenocarcinoma.  There our left segmental pulmonary emboli.  No mediastinal adenopathy  Chest x-ray report from 03/28/2020 [facility] shows an irregular ill-defined opacity in the right upper lobe.  It was noted to be unchanged compared with 2020 but new compared with 2017   Review of Systems As per HPI  Past Medical History:  Diagnosis Date   Anemia    Arthritis    B12 deficiency    Chronic pain disorder    trigeminal neuralgia   Depression    Dyslipidemia    Hypertension    Seizure disorder (HCC)    decades since last seizure   Tremor    Trigeminal nerve disorder      No family history on file.  No family hx lung CA   Social History   Socioeconomic History   Marital status: Widowed    Spouse name: Not on file   Number of children: Not on file   Years of education: Not on file   Highest education level: Not on file  Occupational History   Occupation: retired  Tobacco Use   Smoking status: Never    Passive exposure: Never   Smokeless tobacco: Never  Substance and Sexual Activity   Alcohol use: Not Currently   Drug use: Never   Sexual activity: Not on file  Other Topics Concern   Not on file  Social History Narrative   Not on file   Social Determinants of Health   Financial Resource Strain: Not on file  Food Insecurity: Not on file  Transportation Needs: Not on file  Physical Activity: Not on file  Stress: Not on file  Social Connections: Not on  file  Intimate Partner Violence: Not on file    [place], [place], [place], [place] Was a Runner, 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   Allergies  Allergen Reactions   Penicillins Hives and Rash    Other reaction(s): Other unknown    Tetracyclines & Related Anaphylaxis    Other reaction(s): Other unknown    Sulfa Antibiotics Hives and Other (See Comments)    unknown     Outpatient Medications Prior to Visit  Medication Sig Dispense Refill   atorvastatin (LIPITOR) 20 MG tablet Take 20 mg by mouth daily.     cyanocobalamin (,VITAMIN B-12,) 1000 MCG/ML injection Inject 100 mcg into the muscle every 30 (thirty) days.     dabigatran (PRADAXA) 150 MG CAPS capsule Take 1 capsule (150 mg total) by mouth every 12 (twelve) hours. 60 capsule 2   diclofenac Sodium (VOLTAREN) 1 % GEL Apply 2 g topically daily. For knee pain     docusate sodium (COLACE) 100 MG capsule Take 1 capsule (100 mg total) by mouth 2 (two) times daily. 60 capsule 2   DULoxetine (CYMBALTA) 30 MG capsule Take 30 mg by mouth at bedtime.     esomeprazole (NEXIUM) 40 MG capsule Take 40 mg by mouth daily.     ferrous sulfate 325 (65 FE) MG tablet Take  1 tablet (325 mg total) by mouth daily. 30 tablet 2   gabapentin (NEURONTIN) 300 MG capsule Take 300 mg by mouth daily.     HYDROmorphone (DILAUDID) 2 MG tablet Take 2 mg by mouth in the morning and at bedtime.     losartan (COZAAR) 50 MG tablet Take 50 mg by mouth daily.     meloxicam (MOBIC) 15 MG tablet Take 15 mg by mouth daily.     nebivolol (BYSTOLIC) 5 MG tablet Take 5 mg by mouth daily.     polyethylene glycol powder (GLYCOLAX/MIRALAX) 17 GM/SCOOP powder Take 17 g by mouth daily as needed for mild constipation. 238 g 0   primidone (MYSOLINE) 50 MG tablet Take 50 mg by mouth at bedtime.     traZODone (DESYREL) 100 MG tablet Take 100 mg by mouth at bedtime.     No facility-administered medications prior to visit.        Objective:   Physical Exam  Vitals:   12/11/21 1505  BP:  138/78  Pulse: (!) 58  Temp: 99 F (37.2 C)  TempSrc: Oral  SpO2: 98%  Weight: 138 lb (62.6 kg)  Height: 5\' 3"  (1.6 m)   Gen: Pleasant, elderly, well-nourished, in no distress,  normal affect  ENT: No lesions,  mouth clear,  oropharynx clear, no postnasal drip  Neck: No JVD, no stridor  Lungs: No use of accessory muscles, no crackles or wheezing on normal respiration, no wheeze on forced expiration  Cardiovascular: RRR, heart sounds normal, no murmur or gallops, no peripheral edema  Musculoskeletal: No deformities, no cyanosis or clubbing  Neuro: alert, awake, non focal  Skin: Warm, no lesions or rash    Assessment & Plan:   Pulmonary nodule, right Irregular mixed density pulmonary nodule in the right upper lobe that is quite suspicious for an adenocarcinoma.  That said it could reflect infectious or inflammatory process, even possibly injured lung post pulmonary embolism.  Review of old notes reveals that she did have a right upper lobe opacity seen on chest x-ray from [facility] (I did not find any CTs) going back to 2020 which probably represents the same process.  For now we have decided to follow this lesion with serial imaging.  If it is a slow-growing adenocarcinoma we may not be compelled to act on it as long as she remains asymptomatic.  If symptoms do evolve then we could consider biopsy or even discussion with radiation oncology regarding SBRT without a tissue diagnosis.  We will plan to repeat her CT in 6 months and then follow-up to review.  If she develops symptoms we may decide to image sooner.  We will plan to repeat your CT scan of the chest without contrast in July 2023 Continue your current medication as you are taking it Follow Dr. August 2023 in July after your CT so we can review the results together.  Call if you begin to develop any persistent respiratory symptoms   August, MD, PhD 12/11/2021, 4:35 PM [facility] [phone]  or if no answer before 7:00PM call [phone] For any issues after 7:00PM please call eLink [phone]

## 2021-12-15 ENCOUNTER — Encounter: Payer: Self-pay | Admitting: Diagnostic Neuroimaging

## 2021-12-15 ENCOUNTER — Other Ambulatory Visit: Payer: Self-pay

## 2021-12-15 ENCOUNTER — Ambulatory Visit (INDEPENDENT_AMBULATORY_CARE_PROVIDER_SITE_OTHER): Payer: Medicare Other | Admitting: Diagnostic Neuroimaging

## 2021-12-15 VITALS — BP 126/61 | HR 60 | Ht 63.0 in | Wt 138.0 lb

## 2021-12-15 DIAGNOSIS — G500 Trigeminal neuralgia: Secondary | ICD-10-CM

## 2021-12-15 DIAGNOSIS — R519 Headache, unspecified: Secondary | ICD-10-CM | POA: Diagnosis not present

## 2021-12-15 NOTE — Patient Instructions (Addendum)
°  CHRONIC LEFT FACIAL PAIN (since 1970's) - continue hydromorphone, gabapentin, primidone - CAUTION WITH DRIVING ON POLYPHARMACY - consider pain mgmt referral (wants to hold off)  SEIZURE DISORDER  - last seizure in the 1980's; not on anti seizure treatments

## 2021-12-15 NOTE — Progress Notes (Signed)
[facility]  PATIENT: Kristen [name] DOB: [birth year]  REFERRING CLINICIAN: Emilio Aspen, * HISTORY FROM: patient and daughter REASON FOR VISIT: new consult   HISTORICAL  CHIEF COMPLAINT:  Chief Complaint  Patient presents with   Seizures    Rm 7 New Pt  dgtr- Darl Pikes  "transfer of care from [facility]"   Tremors    HISTORY OF PRESENT ILLNESS:   UPDATE (12/15/21, VRP): 88 year old female here for evaluation of left facial pain.  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  She has had a variety of treatments including multiple medications, deep brain stimulator, gamma knife therapy without relief.  She was last under pain management 7 to 10 years ago.  Since that time she has been on hydromorphone, gabapentin and Provigil for pain control.  Patient does reasonably well most of the day but has some flareup of pain around 2 PM.  She is not that interested in changing her pain medications.  She is not interested in any interventions or surgeries at this time.  OUTSIDE HPI (3/13/1, Dr. Maury Dus): "The patient says that she has had severe headaches "all of her life." It is been in the left retro-orbital region radiating to a left V 2-3 distribution. She has had previous neurosurgical interventions by Dr. Cheri Fowler and Dr. Donette Larry in the [phone] and 1989 respectively. Her headaches persist. It is a deep aching in the left face associated with burning or electric jabs. She has been to the ED numerous times. She had follow-up with Dr. Angelyn Punt and receive the gamma knife treatment. There was no improvement. She is been followed by Dr. Broadus John (a neurosurgery resident when I was a neurology resident 35 years ago together excellent !), at the [facility] in [place], [place]. She is seen him 27 times or more. She's had a DBS placed. It was removed by Dr. Angelyn Punt after the battery failed. There was no improvement in her symptoms.  She was referred to [facility]. She has been on multiple analgesics, neuro-modulating drugs, anticonvulsants, etc. There has been no improvement in her symptoms. More recently, you have provided sphenopalatine blocks. These provided transient relief. A sphenopalatine ablation provided no benefit.  She describes being depressed. She has been on a multitude of anxiety related headaches and antidepressants. These provided modest benefit.  She is currently taking Neurontin, Cymbalta, Wellbutrin, Klonopin is a Pam, primidone, trazodone, meloxicam, Dilantin, Xanax, lidocaine viscus, etc.     REVIEW OF SYSTEMS: Full 14 system review of systems performed and negative with exception of: as per HPI.  ALLERGIES: Allergies  Allergen Reactions   Penicillins Hives and Rash    Other reaction(s): Other unknown    Tetracyclines & Related Anaphylaxis    Other reaction(s): Other unknown    Sulfa Antibiotics Hives and Other (See Comments)    unknown    HOME MEDICATIONS: Outpatient Medications Prior to Visit  Medication Sig Dispense Refill   atorvastatin (LIPITOR) 20 MG tablet Take 20 mg by mouth daily.     cyanocobalamin (,VITAMIN B-12,) 1000 MCG/ML injection Inject 100 mcg into the muscle every 30 (thirty) days.     dabigatran (PRADAXA) 150 MG CAPS capsule Take 1 capsule (150 mg total) by mouth every 12 (twelve) hours. 60 capsule 2   diclofenac Sodium (VOLTAREN) 1 % GEL Apply 2 g topically daily. For knee pain     docusate sodium (COLACE) 100 MG capsule Take 1 capsule (100 mg total) by mouth 2 (two) times  daily. 60 capsule 2   DULoxetine (CYMBALTA) 60 MG capsule Take 60 mg by mouth at bedtime.     esomeprazole (NEXIUM) 40 MG capsule Take 40 mg by mouth daily.     ferrous sulfate 325 (65 FE) MG tablet Take 1 tablet (325 mg total) by mouth daily. 30 tablet 2   gabapentin (NEURONTIN) 300 MG capsule Take 300 mg by mouth daily.     HYDROmorphone (DILAUDID) 2 MG tablet Take 2 mg by mouth in the morning and at  bedtime.     losartan (COZAAR) 50 MG tablet Take 50 mg by mouth daily.     polyethylene glycol powder (GLYCOLAX/MIRALAX) 17 GM/SCOOP powder Take 17 g by mouth daily as needed for mild constipation. 238 g 0   primidone (MYSOLINE) 50 MG tablet Take 50 mg by mouth at bedtime.     traZODone (DESYREL) 100 MG tablet Take 100 mg by mouth at bedtime.     meloxicam (MOBIC) 15 MG tablet Take 15 mg by mouth daily. (Patient not taking: Reported on 12/15/2021)     nebivolol (BYSTOLIC) 5 MG tablet Take 5 mg by mouth daily. (Patient not taking: Reported on 12/15/2021)     DULoxetine (CYMBALTA) 30 MG capsule Take 30 mg by mouth at bedtime.     No facility-administered medications prior to visit.    PAST MEDICAL HISTORY: Past Medical History:  Diagnosis Date   Anemia    Arthritis    B12 deficiency    Chronic pain disorder    trigeminal neuralgia   Depression    Dyslipidemia    Hypertension    Seizure disorder (HCC)    decades since last seizure   Tremor    Trigeminal nerve disorder     PAST SURGICAL HISTORY: Past Surgical History:  Procedure Laterality Date   ABDOMINAL HYSTERECTOMY     BLADDER REPAIR     tack   CATARACT EXTRACTION     CHOLECYSTECTOMY     gamma knife  2004   NOSE SURGERY     for fracture   SHOULDER ARTHROSCOPY DISTAL CLAVICLE EXCISION AND OPEN ROTATOR CUFF REPAIR Left    rotary cuff repair   TONSILLECTOMY     TOTAL KNEE ARTHROPLASTY Bilateral    UMBILICAL HERNIA REPAIR      FAMILY HISTORY: No family history on file.  SOCIAL HISTORY: Social History   Socioeconomic History   Marital status: Widowed    Spouse name: Not on file   Number of children: 3   Years of education: Not on file   Highest education level: Not on file  Occupational History   Occupation: retired  Tobacco Use   Smoking status: Never    Passive exposure: Never   Smokeless tobacco: Never  Substance and Sexual Activity   Alcohol use: Not Currently   Drug use: Never   Sexual activity: Not  on file  Other Topics Concern   Not on file  Social History Narrative   12/15/21 lives at [place]   Social Determinants of Health   Financial Resource Strain: Not on file  Food Insecurity: Not on file  Transportation Needs: Not on file  Physical Activity: Not on file  Stress: Not on file  Social Connections: Not on file  Intimate Partner Violence: Not on file     PHYSICAL EXAM  GENERAL EXAM/CONSTITUTIONAL: Vitals:  Vitals:   12/15/21 1036  BP: 126/61  Pulse: 60  Weight: 138 lb (62.6 kg)  Height: 5\' 3"  (1.6 m)  Body mass index is 24.45 kg/m. Wt Readings from Last 3 Encounters:  12/15/21 138 lb (62.6 kg)  12/11/21 138 lb (62.6 kg)  11/23/21 146 lb 6.2 oz (66.4 kg)   Patient is in no distress; well developed, nourished and groomed; neck is supple  CARDIOVASCULAR: Examination of carotid arteries is normal; no carotid bruits Regular rate and rhythm, no murmurs Examination of peripheral vascular system by observation and palpation is normal  EYES: Ophthalmoscopic exam of optic discs and posterior segments is normal; no papilledema or hemorrhages No results found.  MUSCULOSKELETAL: Gait, strength, tone, movements noted in Neurologic exam below  NEUROLOGIC: MENTAL STATUS:  No flowsheet data found. awake, alert, oriented to person, place and time recent and remote memory intact normal attention and concentration language fluent, comprehension intact, naming intact fund of knowledge appropriate  CRANIAL NERVE:  2nd - no papilledema on fundoscopic exam 2nd, 3rd, 4th, 6th - pupils equal and reactive to light, visual fields full to confrontation, extraocular muscles intact, no nystagmus 5th - facial sensation symmetric 7th - facial strength symmetric 8th - hearing intact 9th - palate elevates symmetrically, uvula midline 11th - shoulder shrug symmetric 12th - tongue protrusion midline  MOTOR:  normal bulk and tone, full strength in the BUE, BLE  SENSORY:   normal and symmetric to light touch  COORDINATION:  finger-nose-finger, fine finger movements normal  REFLEXES:  deep tendon reflexes TRACE and symmetric  GAIT/STATION:  narrow based gait    DIAGNOSTIC DATA (LABS, IMAGING, TESTING) - I reviewed patient records, labs, notes, testing and imaging myself where available.  Lab Results  Component Value Date   WBC 5.5 11/26/2021   HGB 9.5 (L) 11/26/2021   HCT 28.6 (L) 11/26/2021   MCV 93.5 11/26/2021   PLT 167 11/26/2021      Component Value Date/Time   NA 134 (L) 11/26/2021 0344   K 4.3 11/26/2021 0344   CL 99 11/26/2021 0344   CO2 27 11/26/2021 0344   GLUCOSE 85 11/26/2021 0344   BUN 16 11/26/2021 0344   CREATININE 1.16 (H) 11/26/2021 0344   CALCIUM 8.3 (L) 11/26/2021 0344   PROT 6.4 (L) 11/23/2021 1340   ALBUMIN 3.8 11/23/2021 1340   AST 19 11/23/2021 1340   ALT 10 11/23/2021 1340   ALKPHOS 79 11/23/2021 1340   BILITOT 1.1 11/23/2021 1340   GFRNONAA 46 (L) 11/26/2021 0344   No results found for: CHOL, HDL, LDLCALC, LDLDIRECT, TRIG, CHOLHDL No results found for: ENID7O Lab Results  Component Value Date   VITAMINB12 650 11/25/2021   No results found for: TSH     ASSESSMENT AND PLAN  88 y.o. year old female here with:   Dx:  1. Left facial pain   2. Left-sided trigeminal neuralgia      PLAN:  CHRONIC LEFT FACIAL PAIN / TRIGEMINAL NEURALGIA (since 1970's) - continue hydromorphone, gabapentin, primidone - CAUTION WITH DRIVING ON POLYPHARMACY - consider pain mgmt referral (wants to hold off)  SEIZURE DISORDER  - last seizure in the 1980's; not on anti seizure treatments  Return for return to PCP.  I spent 45 minutes of face-to-face and non-face-to-face time with patient.  This included previsit chart review, lab review, study review, order entry, electronic health record documentation, patient education.      Suanne Marker, MD 12/15/2021, 11:24 AM Certified in Neurology, Neurophysiology  and Neuroimaging  [facility] [address] [place], [place] [address] [phone]

## 2021-12-17 DIAGNOSIS — M6281 Muscle weakness (generalized): Secondary | ICD-10-CM | POA: Diagnosis not present

## 2021-12-18 DIAGNOSIS — M6281 Muscle weakness (generalized): Secondary | ICD-10-CM | POA: Diagnosis not present

## 2021-12-23 DIAGNOSIS — M6281 Muscle weakness (generalized): Secondary | ICD-10-CM | POA: Diagnosis not present

## 2021-12-25 DIAGNOSIS — I2699 Other pulmonary embolism without acute cor pulmonale: Secondary | ICD-10-CM | POA: Diagnosis not present

## 2021-12-25 DIAGNOSIS — K1379 Other lesions of oral mucosa: Secondary | ICD-10-CM | POA: Diagnosis not present

## 2021-12-25 DIAGNOSIS — Z20822 Contact with and (suspected) exposure to covid-19: Secondary | ICD-10-CM | POA: Diagnosis not present

## 2021-12-25 DIAGNOSIS — M6281 Muscle weakness (generalized): Secondary | ICD-10-CM | POA: Diagnosis not present

## 2021-12-25 DIAGNOSIS — J3489 Other specified disorders of nose and nasal sinuses: Secondary | ICD-10-CM | POA: Diagnosis not present

## 2021-12-25 DIAGNOSIS — H60502 Unspecified acute noninfective otitis externa, left ear: Secondary | ICD-10-CM | POA: Diagnosis not present

## 2021-12-30 DIAGNOSIS — H60502 Unspecified acute noninfective otitis externa, left ear: Secondary | ICD-10-CM | POA: Diagnosis not present

## 2022-01-05 ENCOUNTER — Ambulatory Visit: Payer: Medicare Other | Admitting: Diagnostic Neuroimaging

## 2022-01-06 DIAGNOSIS — M6281 Muscle weakness (generalized): Secondary | ICD-10-CM | POA: Diagnosis not present

## 2022-01-08 DIAGNOSIS — M6281 Muscle weakness (generalized): Secondary | ICD-10-CM | POA: Diagnosis not present

## 2022-01-13 DIAGNOSIS — M6281 Muscle weakness (generalized): Secondary | ICD-10-CM | POA: Diagnosis not present

## 2022-01-17 DIAGNOSIS — Z20822 Contact with and (suspected) exposure to covid-19: Secondary | ICD-10-CM | POA: Diagnosis not present

## 2022-01-20 DIAGNOSIS — M6281 Muscle weakness (generalized): Secondary | ICD-10-CM | POA: Diagnosis not present

## 2022-01-23 DIAGNOSIS — M6281 Muscle weakness (generalized): Secondary | ICD-10-CM | POA: Diagnosis not present

## 2022-01-23 DIAGNOSIS — F329 Major depressive disorder, single episode, unspecified: Secondary | ICD-10-CM | POA: Diagnosis not present

## 2022-01-23 DIAGNOSIS — G8929 Other chronic pain: Secondary | ICD-10-CM | POA: Diagnosis not present

## 2022-01-23 DIAGNOSIS — I272 Pulmonary hypertension, unspecified: Secondary | ICD-10-CM | POA: Diagnosis not present

## 2022-01-27 DIAGNOSIS — M6281 Muscle weakness (generalized): Secondary | ICD-10-CM | POA: Diagnosis not present

## 2022-01-29 DIAGNOSIS — M6281 Muscle weakness (generalized): Secondary | ICD-10-CM | POA: Diagnosis not present

## 2022-01-30 DIAGNOSIS — Z20822 Contact with and (suspected) exposure to covid-19: Secondary | ICD-10-CM | POA: Diagnosis not present

## 2022-02-03 DIAGNOSIS — M6281 Muscle weakness (generalized): Secondary | ICD-10-CM | POA: Diagnosis not present

## 2022-02-04 DIAGNOSIS — G8929 Other chronic pain: Secondary | ICD-10-CM | POA: Diagnosis not present

## 2022-02-04 DIAGNOSIS — M159 Polyosteoarthritis, unspecified: Secondary | ICD-10-CM | POA: Diagnosis not present

## 2022-02-04 DIAGNOSIS — G509 Disorder of trigeminal nerve, unspecified: Secondary | ICD-10-CM | POA: Diagnosis not present

## 2022-02-04 DIAGNOSIS — F329 Major depressive disorder, single episode, unspecified: Secondary | ICD-10-CM | POA: Diagnosis not present

## 2022-02-04 NOTE — Telephone Encounter (Signed)
Kristen [name] called back. She stated that her mom was at both [facility] and [facility] around the same time. She believes the request will need to go to [facility]. I advised her that I would see if I could fax the request to their MR department. As soon as we have received anything, she would like to receive a phone call.  ? ?Will keep this encounter open for follow up.  ?

## 2022-02-04 NOTE — Telephone Encounter (Signed)
Found a copy of the medical release in patient's chart but it was for [facility]. Daughter called in and said it was [facility]. I called Darl Pikes to double check on the location but she did not answer. Left message for her to call us back. I have the scanned copy of the release with me in A [facility].  ?

## 2022-02-06 DIAGNOSIS — K219 Gastro-esophageal reflux disease without esophagitis: Secondary | ICD-10-CM | POA: Diagnosis not present

## 2022-02-06 DIAGNOSIS — F329 Major depressive disorder, single episode, unspecified: Secondary | ICD-10-CM | POA: Diagnosis not present

## 2022-02-06 DIAGNOSIS — I2699 Other pulmonary embolism without acute cor pulmonale: Secondary | ICD-10-CM | POA: Diagnosis not present

## 2022-02-06 DIAGNOSIS — D518 Other vitamin B12 deficiency anemias: Secondary | ICD-10-CM | POA: Diagnosis not present

## 2022-02-06 DIAGNOSIS — N1832 Chronic kidney disease, stage 3b: Secondary | ICD-10-CM | POA: Diagnosis not present

## 2022-02-06 DIAGNOSIS — G509 Disorder of trigeminal nerve, unspecified: Secondary | ICD-10-CM | POA: Diagnosis not present

## 2022-02-06 DIAGNOSIS — J984 Other disorders of lung: Secondary | ICD-10-CM | POA: Diagnosis not present

## 2022-02-06 DIAGNOSIS — Z1331 Encounter for screening for depression: Secondary | ICD-10-CM | POA: Diagnosis not present

## 2022-02-06 DIAGNOSIS — Z0000 Encounter for general adult medical examination without abnormal findings: Secondary | ICD-10-CM | POA: Diagnosis not present

## 2022-02-06 DIAGNOSIS — M170 Bilateral primary osteoarthritis of knee: Secondary | ICD-10-CM | POA: Diagnosis not present

## 2022-02-06 DIAGNOSIS — R1319 Other dysphagia: Secondary | ICD-10-CM | POA: Diagnosis not present

## 2022-02-06 DIAGNOSIS — D6869 Other thrombophilia: Secondary | ICD-10-CM | POA: Diagnosis not present

## 2022-02-06 DIAGNOSIS — I071 Rheumatic tricuspid insufficiency: Secondary | ICD-10-CM | POA: Diagnosis not present

## 2022-02-06 DIAGNOSIS — E785 Hyperlipidemia, unspecified: Secondary | ICD-10-CM | POA: Diagnosis not present

## 2022-02-10 DIAGNOSIS — M6281 Muscle weakness (generalized): Secondary | ICD-10-CM | POA: Diagnosis not present

## 2022-02-10 DIAGNOSIS — Z20822 Contact with and (suspected) exposure to covid-19: Secondary | ICD-10-CM | POA: Diagnosis not present

## 2022-02-11 DIAGNOSIS — Z20822 Contact with and (suspected) exposure to covid-19: Secondary | ICD-10-CM | POA: Diagnosis not present

## 2022-02-12 DIAGNOSIS — M6281 Muscle weakness (generalized): Secondary | ICD-10-CM | POA: Diagnosis not present

## 2022-02-13 DIAGNOSIS — Z20822 Contact with and (suspected) exposure to covid-19: Secondary | ICD-10-CM | POA: Diagnosis not present

## 2022-02-21 DIAGNOSIS — Z20828 Contact with and (suspected) exposure to other viral communicable diseases: Secondary | ICD-10-CM | POA: Diagnosis not present

## 2022-02-22 DIAGNOSIS — U071 COVID-19: Secondary | ICD-10-CM | POA: Diagnosis not present

## 2022-02-27 DIAGNOSIS — Z20822 Contact with and (suspected) exposure to covid-19: Secondary | ICD-10-CM | POA: Diagnosis not present

## 2022-03-02 DIAGNOSIS — M6281 Muscle weakness (generalized): Secondary | ICD-10-CM | POA: Diagnosis not present

## 2022-03-25 ENCOUNTER — Other Ambulatory Visit: Payer: Self-pay | Admitting: Gastroenterology

## 2022-03-25 DIAGNOSIS — R1310 Dysphagia, unspecified: Secondary | ICD-10-CM

## 2022-03-25 DIAGNOSIS — K219 Gastro-esophageal reflux disease without esophagitis: Secondary | ICD-10-CM | POA: Diagnosis not present

## 2022-03-25 DIAGNOSIS — E785 Hyperlipidemia, unspecified: Secondary | ICD-10-CM | POA: Diagnosis not present

## 2022-03-25 DIAGNOSIS — M810 Age-related osteoporosis without current pathological fracture: Secondary | ICD-10-CM | POA: Diagnosis not present

## 2022-03-25 DIAGNOSIS — M170 Bilateral primary osteoarthritis of knee: Secondary | ICD-10-CM | POA: Diagnosis not present

## 2022-03-25 DIAGNOSIS — G8929 Other chronic pain: Secondary | ICD-10-CM | POA: Diagnosis not present

## 2022-03-25 DIAGNOSIS — F329 Major depressive disorder, single episode, unspecified: Secondary | ICD-10-CM | POA: Diagnosis not present

## 2022-03-25 DIAGNOSIS — N1832 Chronic kidney disease, stage 3b: Secondary | ICD-10-CM | POA: Diagnosis not present

## 2022-04-02 ENCOUNTER — Ambulatory Visit
Admission: RE | Admit: 2022-04-02 | Discharge: 2022-04-02 | Disposition: A | Discharge status: Home / Self Care | Payer: Medicare Other | Source: Ambulatory Visit | Attending: Gastroenterology | Admitting: Gastroenterology

## 2022-04-02 DIAGNOSIS — R1310 Dysphagia, unspecified: Secondary | ICD-10-CM

## 2022-04-24 DIAGNOSIS — G8929 Other chronic pain: Secondary | ICD-10-CM | POA: Diagnosis not present

## 2022-04-24 DIAGNOSIS — I272 Pulmonary hypertension, unspecified: Secondary | ICD-10-CM | POA: Diagnosis not present

## 2022-04-24 DIAGNOSIS — D518 Other vitamin B12 deficiency anemias: Secondary | ICD-10-CM | POA: Diagnosis not present

## 2022-04-24 DIAGNOSIS — M159 Polyosteoarthritis, unspecified: Secondary | ICD-10-CM | POA: Diagnosis not present

## 2022-04-24 DIAGNOSIS — K219 Gastro-esophageal reflux disease without esophagitis: Secondary | ICD-10-CM | POA: Diagnosis not present

## 2022-04-24 DIAGNOSIS — F329 Major depressive disorder, single episode, unspecified: Secondary | ICD-10-CM | POA: Diagnosis not present

## 2022-04-29 ENCOUNTER — Telehealth: Payer: Self-pay | Admitting: Emergency Medicine

## 2022-04-29 DIAGNOSIS — R911 Solitary pulmonary nodule: Secondary | ICD-10-CM

## 2022-04-29 NOTE — Telephone Encounter (Signed)
Inquiring if we got the CT scans from [facility]. Pt also needs to have a f/u CT prior to next OV with Dr. Delton Coombes. According to AVS, this was supposed to be ordered annd completed prior to pt coming back and he adivsed for her to be seen in July. Please advise as no order is placed. LOV 12/11/21

## 2022-04-29 NOTE — Telephone Encounter (Signed)
I placed the order for the ct to be done in July 2023  [name] notified someone will call them to schedule this  She is asking if we received any discs from [facility], please advise, thanks!

## 2022-05-06 ENCOUNTER — Ambulatory Visit (HOSPITAL_BASED_OUTPATIENT_CLINIC_OR_DEPARTMENT_OTHER)
Admission: RE | Admit: 2022-05-06 | Discharge: 2022-05-06 | Disposition: A | Discharge status: Home / Self Care | Payer: Medicare Other | Source: Ambulatory Visit | Attending: Emergency Medicine | Admitting: Emergency Medicine

## 2022-05-06 DIAGNOSIS — R911 Solitary pulmonary nodule: Secondary | ICD-10-CM | POA: Diagnosis not present

## 2022-05-07 DIAGNOSIS — J302 Other seasonal allergic rhinitis: Secondary | ICD-10-CM | POA: Diagnosis not present

## 2022-05-07 DIAGNOSIS — M25562 Pain in left knee: Secondary | ICD-10-CM | POA: Diagnosis not present

## 2022-05-07 DIAGNOSIS — D509 Iron deficiency anemia, unspecified: Secondary | ICD-10-CM | POA: Diagnosis not present

## 2022-05-07 DIAGNOSIS — R1310 Dysphagia, unspecified: Secondary | ICD-10-CM | POA: Diagnosis not present

## 2022-05-07 DIAGNOSIS — M545 Low back pain, unspecified: Secondary | ICD-10-CM | POA: Diagnosis not present

## 2022-05-07 DIAGNOSIS — M25561 Pain in right knee: Secondary | ICD-10-CM | POA: Diagnosis not present

## 2022-05-07 DIAGNOSIS — G8929 Other chronic pain: Secondary | ICD-10-CM | POA: Diagnosis not present

## 2022-05-12 ENCOUNTER — Telehealth: Payer: Self-pay | Admitting: Emergency Medicine

## 2022-05-13 ENCOUNTER — Encounter (HOSPITAL_COMMUNITY): Payer: Self-pay | Admitting: *Deleted

## 2022-05-13 ENCOUNTER — Other Ambulatory Visit: Payer: Self-pay

## 2022-05-13 ENCOUNTER — Emergency Department (HOSPITAL_COMMUNITY): Payer: Medicare Other

## 2022-05-13 ENCOUNTER — Emergency Department (HOSPITAL_COMMUNITY)
Admission: EM | Admit: 2022-05-13 | Discharge: 2022-05-13 | Disposition: A | Discharge status: Skilled Nursing Facility | Payer: Medicare Other | Attending: Student | Admitting: Student

## 2022-05-13 DIAGNOSIS — D649 Anemia, unspecified: Secondary | ICD-10-CM | POA: Insufficient documentation

## 2022-05-13 DIAGNOSIS — R5381 Other malaise: Secondary | ICD-10-CM

## 2022-05-13 DIAGNOSIS — I10 Essential (primary) hypertension: Secondary | ICD-10-CM | POA: Diagnosis not present

## 2022-05-13 DIAGNOSIS — Z79899 Other long term (current) drug therapy: Secondary | ICD-10-CM | POA: Insufficient documentation

## 2022-05-13 DIAGNOSIS — I129 Hypertensive chronic kidney disease with stage 1 through stage 4 chronic kidney disease, or unspecified chronic kidney disease: Secondary | ICD-10-CM | POA: Diagnosis not present

## 2022-05-13 DIAGNOSIS — N281 Cyst of kidney, acquired: Secondary | ICD-10-CM | POA: Diagnosis not present

## 2022-05-13 DIAGNOSIS — R5383 Other fatigue: Secondary | ICD-10-CM | POA: Diagnosis not present

## 2022-05-13 DIAGNOSIS — K838 Other specified diseases of biliary tract: Secondary | ICD-10-CM | POA: Diagnosis not present

## 2022-05-13 DIAGNOSIS — R1013 Epigastric pain: Secondary | ICD-10-CM | POA: Insufficient documentation

## 2022-05-13 DIAGNOSIS — R197 Diarrhea, unspecified: Secondary | ICD-10-CM | POA: Insufficient documentation

## 2022-05-13 DIAGNOSIS — Z20822 Contact with and (suspected) exposure to covid-19: Secondary | ICD-10-CM | POA: Insufficient documentation

## 2022-05-13 DIAGNOSIS — R531 Weakness: Secondary | ICD-10-CM | POA: Diagnosis not present

## 2022-05-13 DIAGNOSIS — N1832 Chronic kidney disease, stage 3b: Secondary | ICD-10-CM | POA: Insufficient documentation

## 2022-05-13 DIAGNOSIS — R918 Other nonspecific abnormal finding of lung field: Secondary | ICD-10-CM | POA: Diagnosis not present

## 2022-05-13 DIAGNOSIS — R911 Solitary pulmonary nodule: Secondary | ICD-10-CM | POA: Diagnosis not present

## 2022-05-13 DIAGNOSIS — R944 Abnormal results of kidney function studies: Secondary | ICD-10-CM | POA: Insufficient documentation

## 2022-05-13 DIAGNOSIS — R059 Cough, unspecified: Secondary | ICD-10-CM | POA: Diagnosis not present

## 2022-05-13 DIAGNOSIS — Z96653 Presence of artificial knee joint, bilateral: Secondary | ICD-10-CM | POA: Diagnosis not present

## 2022-05-13 LAB — COMPREHENSIVE METABOLIC PANEL
ALT: 13 U/L (ref 0–44)
AST: 19 U/L (ref 15–41)
Albumin: 3.5 g/dL (ref 3.5–5.0)
Alkaline Phosphatase: 67 U/L (ref 38–126)
Anion gap: 11 (ref 5–15)
BUN: 14 mg/dL (ref 8–23)
CO2: 25 mmol/L (ref 22–32)
Calcium: 9 mg/dL (ref 8.9–10.3)
Chloride: 101 mmol/L (ref 98–111)
Creatinine, Ser: 1.19 mg/dL — ABNORMAL HIGH (ref 0.44–1.00)
GFR, Estimated: 45 mL/min — ABNORMAL LOW (ref >60)
Glucose, Bld: 151 mg/dL — ABNORMAL HIGH (ref 70–99)
Potassium: 3.9 mmol/L (ref 3.5–5.1)
Sodium: 137 mmol/L (ref 135–145)
Total Bilirubin: 1.3 mg/dL — ABNORMAL HIGH (ref 0.3–1.2)
Total Protein: 5.9 g/dL — ABNORMAL LOW (ref 6.5–8.1)

## 2022-05-13 LAB — CBC WITH DIFFERENTIAL/PLATELET
Abs Immature Granulocytes: 0.01 10*3/uL (ref 0.00–0.07)
Basophils Absolute: 0 10*3/uL (ref 0.0–0.1)
Basophils Relative: 1 %
Eosinophils Absolute: 0 10*3/uL (ref 0.0–0.5)
Eosinophils Relative: 1 %
HCT: 33.9 % — ABNORMAL LOW (ref 36.0–46.0)
Hemoglobin: 11.8 g/dL — ABNORMAL LOW (ref 12.0–15.0)
Immature Granulocytes: 0 %
Lymphocytes Relative: 15 %
Lymphs Abs: 0.9 10*3/uL (ref 0.7–4.0)
MCH: 32.3 pg (ref 26.0–34.0)
MCHC: 34.8 g/dL (ref 30.0–36.0)
MCV: 92.9 fL (ref 80.0–100.0)
Monocytes Absolute: 0.3 10*3/uL (ref 0.1–1.0)
Monocytes Relative: 5 %
Neutro Abs: 4.8 10*3/uL (ref 1.7–7.7)
Neutrophils Relative %: 78 %
Platelets: 197 10*3/uL (ref 150–400)
RBC: 3.65 MIL/uL — ABNORMAL LOW (ref 3.87–5.11)
RDW: 11.7 % (ref 11.5–15.5)
WBC: 6.1 10*3/uL (ref 4.0–10.5)
nRBC: 0 % (ref 0.0–0.2)

## 2022-05-13 LAB — LIPASE, BLOOD: Lipase: 30 U/L (ref 11–51)

## 2022-05-13 LAB — RESP PANEL BY RT-PCR (FLU A&B, COVID) ARPGX2
Influenza A by PCR: NEGATIVE
Influenza B by PCR: NEGATIVE
SARS Coronavirus 2 by RT PCR: NEGATIVE

## 2022-05-13 MED ORDER — ONDANSETRON HCL 4 MG/2ML IJ SOLN
4.0000 mg | Freq: Once | INTRAMUSCULAR | Status: AC
  Administered 2022-05-13: 4 mg via INTRAVENOUS
  Filled 2022-05-13: qty 2

## 2022-05-13 MED ORDER — ACETAMINOPHEN 500 MG PO TABS
1000.0000 mg | ORAL_TABLET | Freq: Once | ORAL | Status: AC
  Administered 2022-05-13: 1000 mg via ORAL
  Filled 2022-05-13: qty 2

## 2022-05-13 MED ORDER — IOHEXOL 350 MG/ML SOLN
100.0000 mL | Freq: Once | INTRAVENOUS | Status: AC | PRN
  Administered 2022-05-13: 100 mL via INTRAVENOUS

## 2022-05-13 MED ORDER — LACTATED RINGERS IV BOLUS
1000.0000 mL | Freq: Once | INTRAVENOUS | Status: AC
  Administered 2022-05-13: 1000 mL via INTRAVENOUS

## 2022-05-13 NOTE — Telephone Encounter (Signed)
Please let her know that I reviewed the CT scan.  It shows that the pulmonary nodule still present, has not changed significantly compared with priors.  We should follow-up as planned to discuss next steps, any future scans or testing.

## 2022-05-13 NOTE — Telephone Encounter (Signed)
Called Kristen [name] back and she stated that she would like the CT results from her mother that was done on 7/12.  Dr Delton Coombes please advise sir

## 2022-05-13 NOTE — ED Notes (Signed)
Patient transported to CT 

## 2022-05-13 NOTE — ED Notes (Signed)
Help get patient undressed into a gown on the monitor did ekg patient is resting with call bell in reach

## 2022-05-13 NOTE — ED Notes (Signed)
Pt back from CT

## 2022-05-13 NOTE — ED Provider Notes (Signed)
[facility] EMERGENCY DEPARTMENT Provider Note  CSN: [id number] Arrival date & time: 05/13/22 [id number]  Chief Complaint(s) Weakness and Abdominal Pain  HPI Kristen [name] is a 88 y.o. female with PMH of trigeminal neuralgia, HTN, HLD, recent admission in February 2023 for new diagnosis of a pulmonary embolism currently on Pradaxa as DOAC contraindicated with her current seizure medicine regimen who presents emergency department for evaluation of multiple complaints including abdominal pain, diarrhea, nasal congestion and cough.  She states that her symptoms have been present for approximately 48 hours and worsening.  She states she just does not want to eat.  She endorses multiple episodes of watery nonbloody diarrhea.  Denies current chest pain, shortness of breath, headache or other systemic symptoms.   Past Medical History Past Medical History:  Diagnosis Date   Anemia    Arthritis    B12 deficiency    Chronic pain disorder    trigeminal neuralgia   Depression    Dyslipidemia    Hypertension    Seizure disorder ([place])    decades since last seizure   Tremor    Trigeminal nerve disorder    Patient Active Problem List   Diagnosis Date Noted   Pulmonary embolism ([place]) 11/26/2021   Acute pulmonary embolism without acute cor pulmonale ([place]) 11/23/2021   Pulmonary nodule, right 11/23/2021   Chronic pain disorder 11/23/2021   Stage 3b chronic kidney disease (CKD) ([place]) 11/23/2021   DNR (do not resuscitate) 11/23/2021   Tricuspid regurgitation 10/07/2021   Pulmonary hypertension, unspecified ([place]) 10/07/2021   Hyperlipidemia 10/07/2021   Essential hypertension 10/07/2021   Atypical chest pain 10/07/2021   Home Medication(s) Prior to Admission medications   Medication Sig Start Date End Date Taking? Authorizing Provider  atorvastatin (LIPITOR) 20 MG tablet Take 20 mg by mouth daily. 09/26/21   [provider]  cyanocobalamin (,VITAMIN B-12,) 1000 MCG/ML  injection Inject 100 mcg into the muscle every 30 (thirty) days. 11/02/19   [provider]  dabigatran (PRADAXA) 150 MG CAPS capsule Take 1 capsule (150 mg total) by mouth every 12 (twelve) hours. 11/26/21 02/24/22  [place] Donnamarie Poag, DO  diclofenac Sodium (VOLTAREN) 1 % GEL Apply 2 g topically daily. For knee pain    [provider]  DULoxetine (CYMBALTA) 60 MG capsule Take 60 mg by mouth at bedtime. 12/05/21   [provider]  esomeprazole (NEXIUM) 40 MG capsule Take 40 mg by mouth daily. 09/26/21   [provider]  ferrous sulfate 325 (65 FE) MG tablet Take 1 tablet (325 mg total) by mouth daily. 11/26/21 02/24/22  [place] Donnamarie Poag, DO  gabapentin (NEURONTIN) 300 MG capsule Take 300 mg by mouth daily. 08/29/21   [provider]  HYDROmorphone (DILAUDID) 2 MG tablet Take 2 mg by mouth in the morning and at bedtime. 10/03/21   [provider]  losartan (COZAAR) 50 MG tablet Take 50 mg by mouth daily. 07/28/21   [provider]  meloxicam (MOBIC) 15 MG tablet Take 15 mg by mouth daily. Patient not taking: Reported on 12/15/2021    [provider]  nebivolol (BYSTOLIC) 5 MG tablet Take 5 mg by mouth daily. Patient not taking: Reported on 12/15/2021    [provider]  primidone (MYSOLINE) 50 MG tablet Take 50 mg by mouth at bedtime. 08/26/21   [provider]  traZODone (DESYREL) 100 MG tablet Take 100 mg by mouth at bedtime. 08/21/21   [provider]  Past Surgical History Past Surgical History:  Procedure Laterality Date   ABDOMINAL HYSTERECTOMY     BLADDER REPAIR     tack   CATARACT EXTRACTION     CHOLECYSTECTOMY     gamma knife  2004   NOSE SURGERY     for fracture   SHOULDER ARTHROSCOPY DISTAL CLAVICLE EXCISION AND OPEN ROTATOR CUFF REPAIR Left    rotary cuff repair    TONSILLECTOMY     TOTAL KNEE ARTHROPLASTY Bilateral    UMBILICAL HERNIA REPAIR     Family History No family history on file.  Social History Social History   Tobacco Use   Smoking status: Never    Passive exposure: Never   Smokeless tobacco: Never  Substance Use Topics   Alcohol use: Not Currently   Drug use: Never   Allergies Penicillins, Tetracyclines & related, and Sulfa antibiotics  Review of Systems Review of Systems  Constitutional:  Positive for chills and fatigue.  Respiratory:  Positive for cough.   Gastrointestinal:  Positive for abdominal pain, diarrhea and nausea.    Physical Exam Vital Signs  I have reviewed the triage vital signs BP (!) 176/64 (BP Location: Right Arm)   Pulse 73   Temp 98.3 F (36.8 C) (Oral)   Resp 20   LMP  (LMP Unknown)   SpO2 99%   Physical Exam Vitals and nursing note reviewed.  Constitutional:      General: She is not in acute distress.    Appearance: She is well-developed.  HENT:     Head: Normocephalic and atraumatic.  Eyes:     Conjunctiva/sclera: Conjunctivae normal.  Cardiovascular:     Rate and Rhythm: Normal rate and regular rhythm.     Heart sounds: No murmur heard. Pulmonary:     Effort: Pulmonary effort is normal. No respiratory distress.     Breath sounds: Normal breath sounds.  Abdominal:     Palpations: Abdomen is soft.     Tenderness: There is abdominal tenderness in the epigastric area.  Musculoskeletal:        General: No swelling.     Cervical back: Neck supple.  Skin:    General: Skin is warm and dry.     Capillary Refill: Capillary refill takes less than 2 seconds.  Neurological:     Mental Status: She is alert.  Psychiatric:        Mood and Affect: Mood normal.     ED Results and Treatments Labs (all labs ordered are listed, but only abnormal results are displayed) Labs Reviewed  CBC WITH DIFFERENTIAL/PLATELET  COMPREHENSIVE METABOLIC PANEL  LIPASE, BLOOD  URINALYSIS, ROUTINE W REFLEX  MICROSCOPIC                                                                                                                          Radiology No results found.  Pertinent labs & imaging results that were available during my care of the patient were reviewed by me  and considered in my medical decision making (see MDM for details).  Medications Ordered in ED Medications - No data to display                                                                                                                                   Procedures Procedures  (including critical care time)  Medical Decision Making / ED Course   This patient presents to the ED for concern of abdominal pain, myalgias, this involves an extensive number of treatment options, and is a complaint that carries with it a high risk of complications and morbidity.  The differential diagnosis includes viral illness, gastroenteritis, intra-abdominal infection, aspiration, esophageal stricture, retained food products, sepsis  MDM: Patient seen emergency room for evaluation of multiple complaints described above.  Physical exam largely unremarkable with no appreciable tenderness palpation in the abdomen.  Laboratory evaluation with a hemoglobin of 11.8, creatinine 1.19, total bili 1.3, COVID and flu negative, chest x-ray unremarkable.  CT chest with contrast was obtained to rule out pneumonia and family concerned about retained food products in the setting of esophageal dysmotility which was reassuringly negative outside of the previously described spiculated lung nodule, CT abdomen pelvis obtained in setting of patient's abdominal pain and diarrhea that is also reassuringly negative.  Patient given Zofran and a liter of fluids as well as Tylenol and on reevaluation, symptoms improved.  Patient does not meet inpatient criteria for admission at this time is safe for discharge with outpatient follow-up.  Given the wide variety of her symptoms,  suspect viral illness.   Additional history obtained: -Additional history obtained from daughter -External records from outside source obtained and reviewed including: Chart review including previous notes, labs, imaging, consultation notes   Lab Tests: -I ordered, reviewed, and interpreted labs.   The pertinent results include:   Labs Reviewed  CBC WITH DIFFERENTIAL/PLATELET  COMPREHENSIVE METABOLIC PANEL  LIPASE, BLOOD  URINALYSIS, ROUTINE W REFLEX MICROSCOPIC      EKG   EKG Interpretation  Date/Time:  Wednesday May 13 2022 08:36:02 EDT Ventricular Rate:  74 PR Interval:  159 QRS Duration: 97 QT Interval:  414 QTC Calculation: 460 R Axis:   15 Text Interpretation: Sinus rhythm Confirmed by Ziza Hastings ([id number]) on 05/13/2022 8:42:31 AM         Imaging Studies ordered: I ordered imaging studies including CT chest, CT abdomen pelvis, chest x-ray I independently visualized and interpreted imaging. I agree with the radiologist interpretation   Medicines ordered and prescription drug management: No orders of the defined types were placed in this encounter.   -I have reviewed the patients home medicines and have made adjustments as needed  Critical interventions none   Cardiac Monitoring: The patient was maintained on a cardiac monitor.  I personally viewed and interpreted the cardiac monitored which showed an underlying rhythm of: NSR  Social Determinants of Health:  Factors impacting patients care include:  none   Reevaluation: After the interventions noted above, I reevaluated the patient and found that they have :improved  Co morbidities that complicate the patient evaluation  Past Medical History:  Diagnosis Date   Anemia    Arthritis    B12 deficiency    Chronic pain disorder    trigeminal neuralgia   Depression    Dyslipidemia    Hypertension    Seizure disorder ([place])    decades since last seizure   Tremor    Trigeminal nerve disorder        Dispostion: I considered admission for this patient, but she does not meet inpatient criteria for admission she is safe for discharge with outpatient follow-up     Final Clinical Impression(s) / ED Diagnoses Final diagnoses:  None     @PCDICTATION @    Sundi Slevin, Debe Coder, MD 05/13/22 2030

## 2022-05-13 NOTE — ED Triage Notes (Addendum)
Pt arrived via EMS from [facility] and has not been feeling right for about 4-5 days. Diarrhea times 3 episodes, abdominal pain

## 2022-05-14 NOTE — Telephone Encounter (Signed)
Called and spoke with pt's daughter Darl Pikes letting her know the info from RB about the results from pt's CT and she verbalized understanding. Darl Pikes wanted to make an appt as soon as possible but stated that RB's next available wasn't until 8/8 so she did schedule an appt with APP 7/26 as well as scheduled an appt with RB 8/9. Stated if pt did not want to see the APP on 7/26 that she would call and cancel that appt. Nothing further needed.

## 2022-05-20 ENCOUNTER — Encounter: Payer: Self-pay | Admitting: Nurse Practitioner

## 2022-05-20 ENCOUNTER — Ambulatory Visit (INDEPENDENT_AMBULATORY_CARE_PROVIDER_SITE_OTHER): Payer: Medicare Other | Admitting: Nurse Practitioner

## 2022-05-20 VITALS — BP 124/52 | HR 75 | Temp 98.4°F | Ht 61.0 in | Wt 141.6 lb

## 2022-05-20 DIAGNOSIS — R0609 Other forms of dyspnea: Secondary | ICD-10-CM | POA: Diagnosis not present

## 2022-05-20 DIAGNOSIS — R911 Solitary pulmonary nodule: Secondary | ICD-10-CM | POA: Diagnosis not present

## 2022-05-20 DIAGNOSIS — R1310 Dysphagia, unspecified: Secondary | ICD-10-CM | POA: Diagnosis not present

## 2022-05-20 DIAGNOSIS — R0789 Other chest pain: Secondary | ICD-10-CM | POA: Diagnosis not present

## 2022-05-20 DIAGNOSIS — M25562 Pain in left knee: Secondary | ICD-10-CM | POA: Diagnosis not present

## 2022-05-20 DIAGNOSIS — J310 Chronic rhinitis: Secondary | ICD-10-CM

## 2022-05-20 DIAGNOSIS — M25561 Pain in right knee: Secondary | ICD-10-CM | POA: Diagnosis not present

## 2022-05-20 DIAGNOSIS — M545 Low back pain, unspecified: Secondary | ICD-10-CM | POA: Diagnosis not present

## 2022-05-20 MED ORDER — ALBUTEROL SULFATE HFA 108 (90 BASE) MCG/ACT IN AERS: 2.0000 | INHALATION_SPRAY | Freq: Four times a day (QID) | RESPIRATORY_TRACT | 2 refills | Status: DC | PRN

## 2022-05-20 MED ORDER — IPRATROPIUM BROMIDE 0.06 % NA SOLN: 2.0000 | Freq: Every day | NASAL | 2 refills | Status: DC

## 2022-05-20 NOTE — Progress Notes (Signed)
@Patient  ID: Kristen [name], female    DOB: [birth year], 88 y.o.   MRN: [id number]  No chief complaint on file.   Referring provider: Kathalene Frames, *  HPI: 88 year old female, never smoker followed for pulmonary nodule.  She is patient Dr. Agustina Caroli and last seen in office 12/11/2021.  Past medical history significant for hypertension, tricuspid regurgitation, PE on Pradaxa, CKD, HLD, atypical chest pain, chronic pain, esophageal dysmotility, GERD.  TEST/EVENTS:  11/23/2021 CTA chest: There is a left pulmonary embolus extending into the left upper and left lower segmental levels.  CAD and atherosclerosis.  There is a right upper lobe centrally solid and peripheral groundglass airspace opacity with the solid portions appearing nodular-like measuring 2.2 x 2.8 cm.  Subpleural left upper lobe pulmonary micronodule.  Subpleural left apical calcified pulmonary micronodule.  Pulmonary micronodule within the right middle lobe.  Trace left pleural effusion. 05/06/2022 super D CT chest: Atherosclerosis and CAD.  No LAD.  There is a part solid nodule in the right upper lobe with parenchymal distortion measuring 2.5 x 2.1 cm, previously 2.4 x 2.1 cm when measured in a similar fashion.  This shows spiculation and extends to the pleural surface along the major fissure in the peripheral right chest, similar to previous imaging.  Solid component or near solid component up to 8 mm. 05/13/2022 CT chest with contrast: Atherosclerosis and CAD.  No LAD.  Unchanged mixed solid and groundglass nodule in the right upper lobe measuring 2.7 x 2.3.  12/11/2021: OV with Dr. Lamonte Sakai for initial consult for pulmonary nodule.  Nodule is suspicious for an adenocarcinoma.  That said it could reflect infectious or inflammatory process, even possibly injured lung post pulmonary embolism.  Review of old notes reveals that she did have a right upper lobe opacity seen on chest x-ray from [place] going back to 2020 which probably  represents the same process.  Discussed neck steps.  Shared decision to move forward with serial imaging.  If it is a slow-growing adenocarcinoma, we may not be compelled to act on it as long as she remains asymptomatic.  If symptoms do evolve then we could consider biopsy or even discussion with radiation oncology regarding SBRT without a tissue diagnosis.  Plan for repeat CT chest in 6 months.  05/20/2022: Today-follow-up Patient presents today with her daughter for follow-up to discuss CT scan results.  She was also seen in the ED on 05/13/2022 with multiple complaints including abdominal pain, diarrhea, nasal congestion and cough.  Work-up was unremarkable.  She did have another CT scan which did not show any change in the lung nodule and no acute process.  Today, she reports that GI symptoms have improved some.  Her cough and breathing are overall unchanged.  She has had longstanding issues (going back years from review of her chart) with chest tightness/pain primarily after eating but also occasionally with exertion.  During these episodes, she will feel like she cannot take a big deep breath; otherwise, she feels like she can complete activities without limitations regarding her breathing.  She was worked up by cardiology without any ischemic findings in December 2022 upon my review.  She does also have issues with swallowing and feeling like food gets stuck.  She had a swallow study done in June which showed severe esophageal dysmotility.  She does not have any follow-up scheduled until August with GI.  She does take Nexium for reflux.  Cough is very minimal; usually occurs in the morning and is  associated with increased postnasal drainage.  Feels like she has some throat congestion.  She does feel like her nasal congestion and drainage is worse when she is at her apartment.  Unsure if this is related to dust or not.  She did feel better when she was at her daughter's house for short time.  She denies any  hemoptysis, weight loss, wheezing, orthopnea, PND, worsening fatigue.  She currently takes Flonase for nasal congestion, which she feels like does help some.  Allergies  Allergen Reactions   Penicillins Hives and Rash    Other reaction(s): Other unknown    Tetracyclines & Related Anaphylaxis    Other reaction(s): Other unknown    Sulfa Antibiotics Hives and Other (See Comments)    unknown     There is no immunization history on file for this patient.  Past Medical History:  Diagnosis Date   Anemia    Arthritis    B12 deficiency    Chronic pain disorder    trigeminal neuralgia   Depression    Dyslipidemia    Hypertension    Seizure disorder (HCC)    decades since last seizure   Tremor    Trigeminal nerve disorder     Tobacco History: Social History   Tobacco Use  Smoking Status Never   Passive exposure: Never  Smokeless Tobacco Never   Counseling given: Not Answered   Outpatient Medications Prior to Visit  Medication Sig Dispense Refill   atorvastatin (LIPITOR) 20 MG tablet Take 20 mg by mouth daily.     cyanocobalamin (,VITAMIN B-12,) 1000 MCG/ML injection Inject 100 mcg into the muscle every 30 (thirty) days.     diclofenac Sodium (VOLTAREN) 1 % GEL Apply 2 g topically daily. For knee pain     DULoxetine (CYMBALTA) 60 MG capsule Take 60 mg by mouth at bedtime.     esomeprazole (NEXIUM) 40 MG capsule Take 40 mg by mouth daily.     gabapentin (NEURONTIN) 300 MG capsule Take 300 mg by mouth daily.     HYDROmorphone (DILAUDID) 2 MG tablet Take 2 mg by mouth in the morning and at bedtime.     losartan (COZAAR) 50 MG tablet Take 50 mg by mouth daily.     primidone (MYSOLINE) 50 MG tablet Take 50 mg by mouth at bedtime.     traZODone (DESYREL) 100 MG tablet Take 100 mg by mouth at bedtime.     dabigatran (PRADAXA) 150 MG CAPS capsule Take 1 capsule (150 mg total) by mouth every 12 (twelve) hours. 60 capsule 2   ferrous sulfate 325 (65 FE) MG tablet Take 1 tablet  (325 mg total) by mouth daily. 30 tablet 2   meloxicam (MOBIC) 15 MG tablet Take 15 mg by mouth daily. (Patient not taking: Reported on 12/15/2021)     nebivolol (BYSTOLIC) 5 MG tablet Take 5 mg by mouth daily. (Patient not taking: Reported on 12/15/2021)     No facility-administered medications prior to visit.     Review of Systems:   Constitutional: No weight loss or gain, night sweats, fevers, chills, fatigue, or lassitude. HEENT: No headaches, tooth/dental problems, or sore throat. No sneezing, itching, ear ache. +nasal congestion, post nasal drip CV:  +exertional and postprandial chest pain. No orthopnea, PND, swelling in lower extremities, anasarca, dizziness, palpitations, syncope Resp: +shortness of breath with chest discomfort; rare cough. No excess mucus or change in color of mucus. No hemoptysis. No wheezing.  No chest wall deformity GI:  +difficulties swallowing.  No heartburn, indigestion, abdominal pain, nausea, vomiting, diarrhea, change in bowel habits, loss of appetite, bloody stools.  Skin: No rash, lesions, ulcerations MSK:  No joint pain or swelling.  No decreased range of motion.  No back pain. Neuro: No dizziness or lightheadedness.  Psych: No depression or anxiety. Mood stable.     Physical Exam:  BP (!) 124/52 (BP Location: Right Arm, Patient Position: Sitting, Cuff Size: Normal)   Pulse 75   Temp 98.4 F (36.9 C) (Oral)   Ht 5\' 1"  (1.549 m)   Wt 141 lb 9.6 oz (64.2 kg)   LMP  (LMP Unknown)   SpO2 95%   BMI 26.76 kg/m   GEN: Pleasant, interactive, well-nourished; elderly; in no acute distress. HEENT:  Normocephalic and atraumatic. PERRLA. Sclera white. Nasal turbinates erythematous, moist and patent bilaterally. Clear rhinorrhea present. Oropharynx pink and moist, without exudate or edema. No lesions, ulcerations NECK:  Supple w/ fair ROM. No JVD present. Normal carotid impulses w/o bruits. Thyroid symmetrical with no goiter or nodules palpated. No  lymphadenopathy.   CV: RRR, early diastolic murmur, no peripheral edema. Pulses intact, +2 bilaterally. No cyanosis, pallor or clubbing. PULMONARY:  Unlabored, regular breathing. Clear bilaterally A&P w/o wheezes/rales/rhonchi. No accessory muscle use. No dullness to percussion. GI: BS present and normoactive. Soft, non-tender to palpation. No organomegaly or masses detected. No CVA tenderness. MSK: No erythema, warmth or tenderness. Cap refil <2 sec all extrem. No deformities or joint swelling noted.  Neuro: A/Ox3. No focal deficits noted.   Skin: Warm, no lesions or rashe Psych: Normal affect and behavior. Judgement and thought content appropriate.     Lab Results:  CBC    Component Value Date/Time   WBC 6.1 05/13/2022 0903   RBC 3.65 (L) 05/13/2022 0903   HGB 11.8 (L) 05/13/2022 0903   HCT 33.9 (L) 05/13/2022 0903   PLT 197 05/13/2022 0903   MCV 92.9 05/13/2022 0903   MCH 32.3 05/13/2022 0903   MCHC 34.8 05/13/2022 0903   RDW 11.7 05/13/2022 0903   LYMPHSABS 0.9 05/13/2022 0903   MONOABS 0.3 05/13/2022 0903   EOSABS 0.0 05/13/2022 0903   BASOSABS 0.0 05/13/2022 0903    BMET    Component Value Date/Time   NA 137 05/13/2022 0903   K 3.9 05/13/2022 0903   CL 101 05/13/2022 0903   CO2 25 05/13/2022 0903   GLUCOSE 151 (H) 05/13/2022 0903   BUN 14 05/13/2022 0903   CREATININE 1.19 (H) 05/13/2022 0903   CALCIUM 9.0 05/13/2022 0903   GFRNONAA 45 (L) 05/13/2022 0903    BNP No results found for: "BNP"   Imaging:  CT ABDOMEN PELVIS W CONTRAST  Result Date: 05/13/2022 CLINICAL DATA:  Respiratory illness, concern for aspiration versus esophageal impaction, epigastric pain, diarrhea EXAM: CT CHEST, ABDOMEN, AND PELVIS WITH CONTRAST TECHNIQUE: Multidetector CT imaging of the chest, abdomen and pelvis was performed following the standard protocol during bolus administration of intravenous contrast. RADIATION DOSE REDUCTION: This exam was performed according to the departmental  dose-optimization program which includes automated exposure control, adjustment of the mA and/or kV according to patient size and/or use of iterative reconstruction technique. CONTRAST:  05/15/2022 OMNIPAQUE IOHEXOL 350 MG/ML SOLN, additional oral enteric contrast COMPARISON:  05/06/2022 FINDINGS: CT CHEST FINDINGS Cardiovascular: Aortic atherosclerosis. Normal heart size. Left and right coronary artery calcifications. No pericardial effusion. Mediastinum/Nodes: No enlarged mediastinal, hilar, or axillary lymph nodes. Thyroid gland, trachea, and esophagus demonstrate no significant findings. Lungs/Pleura: Unchanged mixed solid and ground-glass nodule  of the peripheral right upper lobe measuring 2.7 x 2.3 cm (series 4, image 54). No pleural effusion or pneumothorax. Musculoskeletal: No chest wall mass or suspicious osseous lesions identified. CT ABDOMEN PELVIS FINDINGS Hepatobiliary: No focal liver abnormality is seen. Status post cholecystectomy. Mild postoperative biliary dilatation. Pancreas: Unremarkable. No pancreatic ductal dilatation or surrounding inflammatory changes. Spleen: Normal in size without significant abnormality. Adrenals/Urinary Tract: Adrenal glands are unremarkable. Simple, benign bilateral renal cortical cysts. Kidneys are otherwise normal, without renal calculi, solid lesion, or hydronephrosis. Surgical foreign body posterior to the bladder neck and urethra, possibly a sling (series 3, image 113). Bladder is otherwise unremarkable. Stomach/Bowel: Stomach is within normal limits. Appendix is not clearly visualized and may be surgically absent. No evidence of bowel wall thickening, distention, or inflammatory changes. Vascular/Lymphatic: Aortic atherosclerosis. No enlarged abdominal or pelvic lymph nodes. Reproductive: Status post hysterectomy. Other: No abdominal wall hernia or abnormality. No ascites. Musculoskeletal: No acute osseous findings. IMPRESSION: 1. No acute airspace opacity. 2. No  radiopaque foreign body identified in the esophagus, per concern for esophageal impaction. 3. Unchanged mixed solid and ground-glass nodule of the peripheral right upper lobe. As previously reported, recommend multi disciplinary thoracic referral if clinically appropriate. 4. Coronary artery disease. Aortic Atherosclerosis (ICD10-I70.0). Electronically Signed   By: Delanna Ahmadi M.D.   On: 05/13/2022 13:31   CT Chest W Contrast  Result Date: 05/13/2022 CLINICAL DATA:  Respiratory illness, concern for aspiration versus esophageal impaction, epigastric pain, diarrhea EXAM: CT CHEST, ABDOMEN, AND PELVIS WITH CONTRAST TECHNIQUE: Multidetector CT imaging of the chest, abdomen and pelvis was performed following the standard protocol during bolus administration of intravenous contrast. RADIATION DOSE REDUCTION: This exam was performed according to the departmental dose-optimization program which includes automated exposure control, adjustment of the mA and/or kV according to patient size and/or use of iterative reconstruction technique. CONTRAST:  139mL OMNIPAQUE IOHEXOL 350 MG/ML SOLN, additional oral enteric contrast COMPARISON:  05/06/2022 FINDINGS: CT CHEST FINDINGS Cardiovascular: Aortic atherosclerosis. Normal heart size. Left and right coronary artery calcifications. No pericardial effusion. Mediastinum/Nodes: No enlarged mediastinal, hilar, or axillary lymph nodes. Thyroid gland, trachea, and esophagus demonstrate no significant findings. Lungs/Pleura: Unchanged mixed solid and ground-glass nodule of the peripheral right upper lobe measuring 2.7 x 2.3 cm (series 4, image 54). No pleural effusion or pneumothorax. Musculoskeletal: No chest wall mass or suspicious osseous lesions identified. CT ABDOMEN PELVIS FINDINGS Hepatobiliary: No focal liver abnormality is seen. Status post cholecystectomy. Mild postoperative biliary dilatation. Pancreas: Unremarkable. No pancreatic ductal dilatation or surrounding  inflammatory changes. Spleen: Normal in size without significant abnormality. Adrenals/Urinary Tract: Adrenal glands are unremarkable. Simple, benign bilateral renal cortical cysts. Kidneys are otherwise normal, without renal calculi, solid lesion, or hydronephrosis. Surgical foreign body posterior to the bladder neck and urethra, possibly a sling (series 3, image 113). Bladder is otherwise unremarkable. Stomach/Bowel: Stomach is within normal limits. Appendix is not clearly visualized and may be surgically absent. No evidence of bowel wall thickening, distention, or inflammatory changes. Vascular/Lymphatic: Aortic atherosclerosis. No enlarged abdominal or pelvic lymph nodes. Reproductive: Status post hysterectomy. Other: No abdominal wall hernia or abnormality. No ascites. Musculoskeletal: No acute osseous findings. IMPRESSION: 1. No acute airspace opacity. 2. No radiopaque foreign body identified in the esophagus, per concern for esophageal impaction. 3. Unchanged mixed solid and ground-glass nodule of the peripheral right upper lobe. As previously reported, recommend multi disciplinary thoracic referral if clinically appropriate. 4. Coronary artery disease. Aortic Atherosclerosis (ICD10-I70.0). Electronically Signed   By: Jamse Mead.D.  On: 05/13/2022 13:31   DG Chest Portable 1 View  Result Date: 05/13/2022 CLINICAL DATA:  Rule out pneumonia EXAM: PORTABLE CHEST 1 VIEW COMPARISON:  Chest CT dated May 06, 2022 FINDINGS: The heart size and mediastinal contours are within normal limits. Nodular opacity of the right upper lobe, better characterized on recent prior chest CT. Lungs otherwise clear. Levocurvature of the thoracic spine. IMPRESSION: No evidence of pneumonia. Electronically Signed   By: Allegra Lai M.D.   On: 05/13/2022 09:28   CT Super D Chest Wo Contrast  Result Date: 05/08/2022 CLINICAL DATA:  A female at age 46 presents for evaluation of a RIGHT pulmonary nodule. EXAM: CT CHEST  WITHOUT CONTRAST TECHNIQUE: Multidetector CT imaging of the chest was performed using thin slice collimation for electromagnetic bronchoscopy planning purposes, without intravenous contrast. RADIATION DOSE REDUCTION: This exam was performed according to the departmental dose-optimization program which includes automated exposure control, adjustment of the mA and/or kV according to patient size and/or use of iterative reconstruction technique. COMPARISON:  November 23, 2021. FINDINGS: Cardiovascular: Calcified aortic atherosclerosis. No aneurysmal dilation of the thoracic aorta. Normal heart size. Calcified coronary artery disease. Normal caliber central pulmonary vessels. Limited assessment of cardiovascular structures given lack of intravenous contrast. Mediastinum/Nodes: No signs of adenopathy or acute process in the mediastinum. No signs of adenopathy elsewhere in the chest. Lungs/Pleura: A part solid nodule in the RIGHT upper lobe with parenchymal distortion measures 2.5 x 2.1 cm greatest axial dimension, previously approximately 2.4 x 2.1 cm when measured in a similar fashion. This shows spiculation and extends to the pleural surface along the major fissure and the peripheral RIGHT chest as on previous imaging. Solid component or near solid component up to 8 mm. (Image 66/5) Airways are patent. No acute findings of consolidation or sign of pleural effusion. Upper Abdomen: Incidental imaging of upper abdominal contents is unremarkable. Musculoskeletal: No chest wall mass. No acute bone finding. No destructive bone process. Spinal degenerative changes. IMPRESSION: 1. Persistence of a part solid nodule in the RIGHT upper lobe with solid or near solid component increasing conspicuity and measuring potentially up to 8 mm. Referral to multi disciplinary thoracic oncologic setting is suggested with PET imaging or biopsy as warranted. 2. Calcified coronary artery disease. 3. Aortic atherosclerosis. Aortic  Atherosclerosis (ICD10-I70.0). Electronically Signed   By: Donzetta Kohut M.D.   On: 05/08/2022 10:54          No data to display          No results found for: "NITRICOXIDE"      Assessment & Plan:   Pulmonary nodule, right Imaging reviewed by Dr. Delton Coombes.  Nodule is overall stable when compared to January 2023.  She has not had any worsening respiratory symptoms; current symptoms have dated back to 2 years or longer.  She does not seem to want to do any invasive procedures regardless.  We will plan to repeat imaging in 6 months to assess stability.  Patient Instructions  Trial Albuterol inhaler 2 puffs every 6 hours as needed for shortness of breath, chest tightness or wheezing Continue flonase nasal spray 2 sprays each nostril daily  Atrovent nasal spray 2 sprays each nostril 1-3 times a day. Start with once daily and can increase up to three times a day for nasal congestion/drainage Over the counter allergy medicine such as Claritin, Zyrtec, Xyzal or Allegra  CT chest in 6 months for follow up of your lung nodule   Follow up in 6 months with  Dr. Lamonte Sakai after CT chest. If symptoms do not improve or worsen, please contact office for sooner follow up or seek emergency care.     Atypical chest pain No active chest pain.  Recent EKG and labs in ED were normal.  Upon review, appears that this problem has been ongoing for 2 years or longer.  Cardiac work-up from December did not reveal any evidence of ischemia.  Primarily postprandial in nature.  Occasionally occurs with exertion.  She has associated shortness of breath with feeling like she cannot take a big deep breath in when this happens.  She also has severe esophageal dysmotility.  Provided on ED precautions should the pain worsen and not resolve.  DOE (dyspnea on exertion) Low suspicion that this is related to her underlying nodule given the length of time that the symptoms have occurred.  She is a never smoker and does not  seem to be asthmatic in nature.  However she does have history of allergies and dyspnea occasionally occurs with exertion with sensation of chest tightness.  Advised that we could trial albuterol inhaler.  She was agreeable to this.  We did discuss role of PFTs but she did not want to move forward with this.  Chronic rhinitis Allergic versus vasomotor.  Postnasal drainage likely contributing to congestion and occasional cough.  She does seem to have an environmental component as her symptoms get worse when she is at her apartment versus being at her daughter's house.  She is currently using intranasal steroid at night.  Advised that she start over-the-counter antihistamine.  We will add on ipratropium nasal spray daily, can increase up to 3 times a day as needed for nasal drainage/congestion.  Dysphagia Severe esophageal dysmotility on swallow study from June 2023, likely contributes to her postprandial chest discomfort.  She has follow-up scheduled with GI in August.   I spent 35 minutes of dedicated to the care of this patient on the date of this encounter to include pre-visit review of records, face-to-face time with the patient discussing conditions above, post visit ordering of testing, clinical documentation with the electronic health record, making appropriate referrals as documented, and communicating necessary findings to members of the patients care team.  Clayton Bibles, NP 05/20/2022  Pt aware and understands NP's role.

## 2022-05-20 NOTE — Assessment & Plan Note (Signed)
No active chest pain.  Recent EKG and labs in ED were normal.  Upon review, appears that this problem has been ongoing for 2 years or longer.  Cardiac work-up from December did not reveal any evidence of ischemia.  Primarily postprandial in nature.  Occasionally occurs with exertion.  She has associated shortness of breath with feeling like she cannot take a big deep breath in when this happens.  She also has severe esophageal dysmotility.  Provided on ED precautions should the pain worsen and not resolve.

## 2022-05-20 NOTE — Assessment & Plan Note (Signed)
Severe esophageal dysmotility on swallow study from June 2023, likely contributes to her postprandial chest discomfort.  She has follow-up scheduled with GI in August.

## 2022-05-20 NOTE — Patient Instructions (Addendum)
Trial Albuterol inhaler 2 puffs every 6 hours as needed for shortness of breath, chest tightness or wheezing Continue flonase nasal spray 2 sprays each nostril daily  Atrovent nasal spray 2 sprays each nostril 1-3 times a day. Start with once daily and can increase up to three times a day for nasal congestion/drainage Over the counter allergy medicine such as Claritin, Zyrtec, Xyzal or Allegra  CT chest in 6 months for follow up of your lung nodule   Follow up in 6 months with Dr. Delton Coombes after CT chest. If symptoms do not improve or worsen, please contact office for sooner follow up or seek emergency care.

## 2022-05-20 NOTE — Assessment & Plan Note (Signed)
Allergic versus vasomotor.  Postnasal drainage likely contributing to congestion and occasional cough.  She does seem to have an environmental component as her symptoms get worse when she is at her apartment versus being at her daughter's house.  She is currently using intranasal steroid at night.  Advised that she start over-the-counter antihistamine.  We will add on ipratropium nasal spray daily, can increase up to 3 times a day as needed for nasal drainage/congestion.

## 2022-05-20 NOTE — Assessment & Plan Note (Signed)
Low suspicion that this is related to her underlying nodule given the length of time that the symptoms have occurred.  She is a never smoker and does not seem to be asthmatic in nature.  However she does have history of allergies and dyspnea occasionally occurs with exertion with sensation of chest tightness.  Advised that we could trial albuterol inhaler.  She was agreeable to this.  We did discuss role of PFTs but she did not want to move forward with this.

## 2022-05-20 NOTE — Assessment & Plan Note (Addendum)
Imaging reviewed by Dr. Delton Coombes.  Nodule is overall stable when compared to January 2023.  She has not had any worsening respiratory symptoms; current symptoms have dated back to 2 years or longer.  She does not seem to want to do any invasive procedures regardless.  We will plan to repeat imaging in 6 months to assess stability.  Patient Instructions  Trial Albuterol inhaler 2 puffs every 6 hours as needed for shortness of breath, chest tightness or wheezing Continue flonase nasal spray 2 sprays each nostril daily  Atrovent nasal spray 2 sprays each nostril 1-3 times a day. Start with once daily and can increase up to three times a day for nasal congestion/drainage Over the counter allergy medicine such as Claritin, Zyrtec, Xyzal or Allegra  CT chest in 6 months for follow up of your lung nodule   Follow up in 6 months with Dr. Delton Coombes after CT chest. If symptoms do not improve or worsen, please contact office for sooner follow up or seek emergency care.

## 2022-05-25 DIAGNOSIS — K219 Gastro-esophageal reflux disease without esophagitis: Secondary | ICD-10-CM | POA: Diagnosis not present

## 2022-05-25 DIAGNOSIS — F329 Major depressive disorder, single episode, unspecified: Secondary | ICD-10-CM | POA: Diagnosis not present

## 2022-05-25 DIAGNOSIS — G8929 Other chronic pain: Secondary | ICD-10-CM | POA: Diagnosis not present

## 2022-05-25 DIAGNOSIS — M159 Polyosteoarthritis, unspecified: Secondary | ICD-10-CM | POA: Diagnosis not present

## 2022-05-25 DIAGNOSIS — I272 Pulmonary hypertension, unspecified: Secondary | ICD-10-CM | POA: Diagnosis not present

## 2022-06-03 ENCOUNTER — Ambulatory Visit: Payer: BC Managed Care – PPO | Admitting: Emergency Medicine

## 2022-06-08 NOTE — Telephone Encounter (Signed)
Patient was seen by Kristen [name] on 07/26. Will close encounter.

## 2022-06-09 DIAGNOSIS — M6281 Muscle weakness (generalized): Secondary | ICD-10-CM | POA: Diagnosis not present

## 2022-06-10 ENCOUNTER — Other Ambulatory Visit: Payer: Self-pay | Admitting: Internal Medicine

## 2022-06-10 DIAGNOSIS — M6281 Muscle weakness (generalized): Secondary | ICD-10-CM | POA: Diagnosis not present

## 2022-06-10 DIAGNOSIS — M810 Age-related osteoporosis without current pathological fracture: Secondary | ICD-10-CM

## 2022-06-15 DIAGNOSIS — M6281 Muscle weakness (generalized): Secondary | ICD-10-CM | POA: Diagnosis not present

## 2022-06-17 DIAGNOSIS — M6281 Muscle weakness (generalized): Secondary | ICD-10-CM | POA: Diagnosis not present

## 2022-06-22 DIAGNOSIS — M6281 Muscle weakness (generalized): Secondary | ICD-10-CM | POA: Diagnosis not present

## 2022-06-23 DIAGNOSIS — K219 Gastro-esophageal reflux disease without esophagitis: Secondary | ICD-10-CM | POA: Diagnosis not present

## 2022-06-23 DIAGNOSIS — G8929 Other chronic pain: Secondary | ICD-10-CM | POA: Diagnosis not present

## 2022-06-23 DIAGNOSIS — M170 Bilateral primary osteoarthritis of knee: Secondary | ICD-10-CM | POA: Diagnosis not present

## 2022-06-23 DIAGNOSIS — E785 Hyperlipidemia, unspecified: Secondary | ICD-10-CM | POA: Diagnosis not present

## 2022-06-23 DIAGNOSIS — F329 Major depressive disorder, single episode, unspecified: Secondary | ICD-10-CM | POA: Diagnosis not present

## 2022-06-23 DIAGNOSIS — N1832 Chronic kidney disease, stage 3b: Secondary | ICD-10-CM | POA: Diagnosis not present

## 2022-06-24 DIAGNOSIS — M6281 Muscle weakness (generalized): Secondary | ICD-10-CM | POA: Diagnosis not present

## 2022-06-29 DIAGNOSIS — M6281 Muscle weakness (generalized): Secondary | ICD-10-CM | POA: Diagnosis not present

## 2022-07-03 DIAGNOSIS — M6281 Muscle weakness (generalized): Secondary | ICD-10-CM | POA: Diagnosis not present

## 2022-07-06 DIAGNOSIS — M6281 Muscle weakness (generalized): Secondary | ICD-10-CM | POA: Diagnosis not present

## 2022-07-08 DIAGNOSIS — M6281 Muscle weakness (generalized): Secondary | ICD-10-CM | POA: Diagnosis not present

## 2022-07-10 DIAGNOSIS — M6281 Muscle weakness (generalized): Secondary | ICD-10-CM | POA: Diagnosis not present

## 2022-07-13 DIAGNOSIS — M6281 Muscle weakness (generalized): Secondary | ICD-10-CM | POA: Diagnosis not present

## 2022-07-15 DIAGNOSIS — M6281 Muscle weakness (generalized): Secondary | ICD-10-CM | POA: Diagnosis not present

## 2022-07-16 ENCOUNTER — Other Ambulatory Visit: Payer: BC Managed Care – PPO

## 2022-07-20 DIAGNOSIS — R1310 Dysphagia, unspecified: Secondary | ICD-10-CM | POA: Diagnosis not present

## 2022-07-20 DIAGNOSIS — M6281 Muscle weakness (generalized): Secondary | ICD-10-CM | POA: Diagnosis not present

## 2022-07-22 DIAGNOSIS — M6281 Muscle weakness (generalized): Secondary | ICD-10-CM | POA: Diagnosis not present

## 2022-07-23 DIAGNOSIS — N1832 Chronic kidney disease, stage 3b: Secondary | ICD-10-CM | POA: Diagnosis not present

## 2022-07-23 DIAGNOSIS — F329 Major depressive disorder, single episode, unspecified: Secondary | ICD-10-CM | POA: Diagnosis not present

## 2022-07-23 DIAGNOSIS — K219 Gastro-esophageal reflux disease without esophagitis: Secondary | ICD-10-CM | POA: Diagnosis not present

## 2022-07-23 DIAGNOSIS — G8929 Other chronic pain: Secondary | ICD-10-CM | POA: Diagnosis not present

## 2022-07-23 DIAGNOSIS — E785 Hyperlipidemia, unspecified: Secondary | ICD-10-CM | POA: Diagnosis not present

## 2022-07-27 DIAGNOSIS — M6281 Muscle weakness (generalized): Secondary | ICD-10-CM | POA: Diagnosis not present

## 2022-07-29 DIAGNOSIS — M6281 Muscle weakness (generalized): Secondary | ICD-10-CM | POA: Diagnosis not present

## 2022-08-03 DIAGNOSIS — R1310 Dysphagia, unspecified: Secondary | ICD-10-CM | POA: Diagnosis not present

## 2022-08-03 DIAGNOSIS — R0989 Other specified symptoms and signs involving the circulatory and respiratory systems: Secondary | ICD-10-CM | POA: Diagnosis not present

## 2022-08-03 DIAGNOSIS — F329 Major depressive disorder, single episode, unspecified: Secondary | ICD-10-CM | POA: Diagnosis not present

## 2022-08-03 DIAGNOSIS — K219 Gastro-esophageal reflux disease without esophagitis: Secondary | ICD-10-CM | POA: Diagnosis not present

## 2022-08-03 DIAGNOSIS — R5382 Chronic fatigue, unspecified: Secondary | ICD-10-CM | POA: Diagnosis not present

## 2022-08-03 DIAGNOSIS — N390 Urinary tract infection, site not specified: Secondary | ICD-10-CM | POA: Diagnosis not present

## 2022-08-05 DIAGNOSIS — M6281 Muscle weakness (generalized): Secondary | ICD-10-CM | POA: Diagnosis not present

## 2022-08-10 DIAGNOSIS — G8929 Other chronic pain: Secondary | ICD-10-CM | POA: Diagnosis not present

## 2022-08-10 DIAGNOSIS — F329 Major depressive disorder, single episode, unspecified: Secondary | ICD-10-CM | POA: Diagnosis not present

## 2022-08-10 DIAGNOSIS — E785 Hyperlipidemia, unspecified: Secondary | ICD-10-CM | POA: Diagnosis not present

## 2022-08-10 DIAGNOSIS — M6281 Muscle weakness (generalized): Secondary | ICD-10-CM | POA: Diagnosis not present

## 2022-08-10 DIAGNOSIS — K219 Gastro-esophageal reflux disease without esophagitis: Secondary | ICD-10-CM | POA: Diagnosis not present

## 2022-08-10 DIAGNOSIS — N1832 Chronic kidney disease, stage 3b: Secondary | ICD-10-CM | POA: Diagnosis not present

## 2022-08-10 DIAGNOSIS — M810 Age-related osteoporosis without current pathological fracture: Secondary | ICD-10-CM | POA: Diagnosis not present

## 2022-08-10 DIAGNOSIS — D518 Other vitamin B12 deficiency anemias: Secondary | ICD-10-CM | POA: Diagnosis not present

## 2022-08-13 DIAGNOSIS — M6281 Muscle weakness (generalized): Secondary | ICD-10-CM | POA: Diagnosis not present

## 2022-08-17 DIAGNOSIS — M6281 Muscle weakness (generalized): Secondary | ICD-10-CM | POA: Diagnosis not present

## 2022-08-21 DIAGNOSIS — M6281 Muscle weakness (generalized): Secondary | ICD-10-CM | POA: Diagnosis not present

## 2022-08-26 DIAGNOSIS — M6281 Muscle weakness (generalized): Secondary | ICD-10-CM | POA: Diagnosis not present

## 2022-08-28 DIAGNOSIS — M6281 Muscle weakness (generalized): Secondary | ICD-10-CM | POA: Diagnosis not present

## 2022-09-02 DIAGNOSIS — M6281 Muscle weakness (generalized): Secondary | ICD-10-CM | POA: Diagnosis not present

## 2022-09-04 DIAGNOSIS — M6281 Muscle weakness (generalized): Secondary | ICD-10-CM | POA: Diagnosis not present

## 2022-09-09 DIAGNOSIS — M6281 Muscle weakness (generalized): Secondary | ICD-10-CM | POA: Diagnosis not present

## 2022-09-11 DIAGNOSIS — M6281 Muscle weakness (generalized): Secondary | ICD-10-CM | POA: Diagnosis not present

## 2022-09-23 DIAGNOSIS — M6281 Muscle weakness (generalized): Secondary | ICD-10-CM | POA: Diagnosis not present

## 2022-09-24 DIAGNOSIS — F4321 Adjustment disorder with depressed mood: Secondary | ICD-10-CM | POA: Diagnosis not present

## 2022-10-09 DIAGNOSIS — R300 Dysuria: Secondary | ICD-10-CM | POA: Diagnosis not present

## 2022-10-13 ENCOUNTER — Emergency Department (HOSPITAL_COMMUNITY)
Admission: EM | Admit: 2022-10-13 | Discharge: 2022-10-13 | Disposition: A | Discharge status: Home / Self Care | Payer: Medicare Other | Attending: Emergency Medicine | Admitting: Emergency Medicine

## 2022-10-13 ENCOUNTER — Encounter (HOSPITAL_COMMUNITY): Payer: Self-pay

## 2022-10-13 ENCOUNTER — Emergency Department (HOSPITAL_COMMUNITY): Payer: Medicare Other

## 2022-10-13 ENCOUNTER — Other Ambulatory Visit: Payer: Self-pay

## 2022-10-13 DIAGNOSIS — S62101A Fracture of unspecified carpal bone, right wrist, initial encounter for closed fracture: Secondary | ICD-10-CM | POA: Insufficient documentation

## 2022-10-13 DIAGNOSIS — S0993XA Unspecified injury of face, initial encounter: Secondary | ICD-10-CM | POA: Diagnosis present

## 2022-10-13 DIAGNOSIS — W19XXXA Unspecified fall, initial encounter: Secondary | ICD-10-CM | POA: Diagnosis not present

## 2022-10-13 DIAGNOSIS — M25521 Pain in right elbow: Secondary | ICD-10-CM | POA: Diagnosis not present

## 2022-10-13 DIAGNOSIS — G9389 Other specified disorders of brain: Secondary | ICD-10-CM | POA: Diagnosis not present

## 2022-10-13 DIAGNOSIS — S52571A Other intraarticular fracture of lower end of right radius, initial encounter for closed fracture: Secondary | ICD-10-CM | POA: Diagnosis not present

## 2022-10-13 DIAGNOSIS — S0990XA Unspecified injury of head, initial encounter: Secondary | ICD-10-CM | POA: Diagnosis not present

## 2022-10-13 DIAGNOSIS — S0083XA Contusion of other part of head, initial encounter: Secondary | ICD-10-CM | POA: Insufficient documentation

## 2022-10-13 DIAGNOSIS — S60211A Contusion of right wrist, initial encounter: Secondary | ICD-10-CM | POA: Diagnosis not present

## 2022-10-13 DIAGNOSIS — I10 Essential (primary) hypertension: Secondary | ICD-10-CM | POA: Diagnosis not present

## 2022-10-13 DIAGNOSIS — S52611A Displaced fracture of right ulna styloid process, initial encounter for closed fracture: Secondary | ICD-10-CM | POA: Diagnosis not present

## 2022-10-13 MED ORDER — HYDROMORPHONE HCL 2 MG PO TABS
1.0000 mg | ORAL_TABLET | Freq: Once | ORAL | Status: AC
  Administered 2022-10-13: 1 mg via ORAL
  Filled 2022-10-13: qty 1

## 2022-10-13 MED ORDER — FENTANYL CITRATE PF 50 MCG/ML IJ SOSY
50.0000 ug | PREFILLED_SYRINGE | Freq: Once | INTRAMUSCULAR | Status: AC
  Administered 2022-10-13: 50 ug via INTRAMUSCULAR
  Filled 2022-10-13: qty 1

## 2022-10-13 NOTE — Progress Notes (Signed)
Orthopedic Tech Progress Note Patient Details:  Kristen [name] 01-11-1933 [id number]  Ortho Devices Type of Ortho Device: Ace wrap, Sugartong splint, Arm sling Ortho Device/Splint Location: RUE Ortho Device/Splint Interventions: Ordered, Application, Adjustment   Post Interventions Patient Tolerated: Well, Fair Instructions Provided: Care of device  [name] 10/13/2022, 1:38 PM

## 2022-10-13 NOTE — Discharge Instructions (Signed)
Your history, exam, workup today lets get a CT of your head that was reassuring with no evidence of acute intracranial hemorrhage or skull fracture but your wrist did show fractures.  I spoke with the hand surgeon he wants to see you in clinic this week after you had it splinted.  Please follow-up with the orthopedics team and your PCP.  If any symptoms change or worsen acutely, please return to the nearest emergency department.  Please try to keep it elevated and use your home pain medicine.

## 2022-10-13 NOTE — ED Notes (Signed)
Trauma Response Nurse Documentation  Kristen [name] is a 88 y.o. female arriving to [facility] ED via EMS  On Pradaxa (dabigatran) twice a day. Trauma was activated as a Level 2 based on the following trauma criteria Elderly patients > 65 with head trauma on anti-coagulation (excluding ASA). Trauma team at the bedside on patient arrival.   Patient cleared for CT by Dr. Rush Landmark. Pt transported to CT with trauma response nurse present to monitor. RN remained with the patient throughout their absence from the department for clinical observation. GCS 15.  History   Past Medical History:  Diagnosis Date   Anemia    Arthritis    B12 deficiency    Chronic pain disorder    trigeminal neuralgia   Depression    Dyslipidemia    Hypertension    Seizure disorder (HCC)    decades since last seizure   Tremor    Trigeminal nerve disorder      Past Surgical History:  Procedure Laterality Date   ABDOMINAL HYSTERECTOMY     BLADDER REPAIR     tack   CATARACT EXTRACTION     CHOLECYSTECTOMY     gamma knife  2004   NOSE SURGERY     for fracture   SHOULDER ARTHROSCOPY DISTAL CLAVICLE EXCISION AND OPEN ROTATOR CUFF REPAIR Left    rotary cuff repair   TONSILLECTOMY     TOTAL KNEE ARTHROPLASTY Bilateral    UMBILICAL HERNIA REPAIR       Initial Focused Assessment (If applicable, or please see trauma documentation): Patient A&Ox4, GCS 15 Small abrasion to right side of forehead Obvious deformity to  CT's Completed:   CT Head   Interventions:  IM Fentanyl XRs hand, wrist, elbow CT Head Sling/splint  Plan for disposition:  Awaiting recommendations from hand surgery.  Consults completed:  Orthopaedic Surgeon / Hand surgery at 1134.  Event Summary: Patient to ED from [facility] after falling from a stepstool. Caught herself with her right hand and hit the corner of the cabinet on her forehead. Small abrasion to right forehead, deformity to right wrist. No LOC, GCS 15. Takes Pradaxa for clots  in her lungs. Imaging revealed R radial/ulnar fxs. Awaiting recommendations from hand surgery.  Bedside handoff with ED RN [place].    Jill Side Ercel Pepitone  Trauma Response RN  Please call TRN at [phone] for further assistance.

## 2022-10-13 NOTE — ED Triage Notes (Signed)
Pt BIB [facility] from home where she was on a step stool and had a mechanical fall while trying to get some socks out of a drawer. Pt is on a blood thinner. Pt has an abrasion above the right eyebrow. Pt is c/o right hand, wrist and elbow pain.

## 2022-10-13 NOTE — Progress Notes (Signed)
Responded to page to support patient that experienced a fall from step stool.Pt is alert and communicating with staff.  Chaplain provided emotional and spiritual support. Chaplain available as needed.  Venida Jarvis, [place], [facility], Pager [phone]

## 2022-10-13 NOTE — ED Provider Notes (Signed)
[facility] EMERGENCY DEPARTMENT Provider Note   CSN: [id number] Arrival date & time: 10/13/22  1031     History  No chief complaint on file.   Kristen [name] is a 88 y.o. female.  The history is provided by the patient, the EMS personnel and medical records. No language interpreter was used.  Trauma Mechanism of injury: Fall Injury location: head/neck and shoulder/arm Injury location detail: head and R wrist and R elbow Incident location: home Arrived directly from scene: yes   Fall:      Fall occurred: from a stepstool.      Point of impact: outstretched arms and head      Entrapped after fall: no  EMS/PTA data:      Bystander interventions: none      Ambulatory at scene: yes      Blood loss: none      Responsiveness: alert      Loss of consciousness: no      Amnesic to event: no      Airway interventions: none  Current symptoms:      Associated symptoms:            Reports headache.            Denies abdominal pain, back pain, chest pain, loss of consciousness, nausea, neck pain and vomiting.       Home Medications Prior to Admission medications   Medication Sig Start Date End Date Taking? Authorizing Provider  albuterol (VENTOLIN HFA) 108 (90 Base) MCG/ACT inhaler Inhale 2 puffs into the lungs every 6 (six) hours as needed for wheezing or shortness of breath. 05/20/22   Cobb, Ruby Cola, NP  atorvastatin (LIPITOR) 20 MG tablet Take 20 mg by mouth daily. 09/26/21   [provider]  cyanocobalamin (,VITAMIN B-12,) 1000 MCG/ML injection Inject 100 mcg into the muscle every 30 (thirty) days. 11/02/19   [provider]  dabigatran (PRADAXA) 150 MG CAPS capsule Take 1 capsule (150 mg total) by mouth every 12 (twelve) hours. 11/26/21 02/24/22  [place], Alvira Philips, DO  diclofenac Sodium (VOLTAREN) 1 % GEL Apply 2 g topically daily. For knee pain    [provider]  DULoxetine (CYMBALTA) 60 MG capsule Take 60 mg by mouth at bedtime.  12/05/21   [provider]  esomeprazole (NEXIUM) 40 MG capsule Take 40 mg by mouth daily. 09/26/21   [provider]  ferrous sulfate 325 (65 FE) MG tablet Take 1 tablet (325 mg total) by mouth daily. 11/26/21 02/24/22  [place], Alvira Philips, DO  gabapentin (NEURONTIN) 300 MG capsule Take 300 mg by mouth daily. 08/29/21   [provider]  HYDROmorphone (DILAUDID) 2 MG tablet Take 2 mg by mouth in the morning and at bedtime. 10/03/21   [provider]  ipratropium (ATROVENT) 0.06 % nasal spray Place 2 sprays into both nostrils daily. 05/20/22   Cobb, Ruby Cola, NP  losartan (COZAAR) 50 MG tablet Take 50 mg by mouth daily. 07/28/21   [provider]  meloxicam (MOBIC) 15 MG tablet Take 15 mg by mouth daily. Patient not taking: Reported on 12/15/2021    [provider]  nebivolol (BYSTOLIC) 5 MG tablet Take 5 mg by mouth daily. Patient not taking: Reported on 12/15/2021    [provider]  primidone (MYSOLINE) 50 MG tablet Take 50 mg by mouth at bedtime. 08/26/21   [provider]  traZODone (DESYREL) 100 MG tablet Take 100 mg by mouth at bedtime. 08/21/21  [provider]      Allergies    Penicillins, Tetracyclines & related, and Sulfa antibiotics    Review of Systems   Review of Systems  Constitutional:  Negative for chills, diaphoresis, fatigue and fever.  HENT:  Negative for congestion.   Eyes:  Negative for visual disturbance.  Respiratory:  Negative for cough, chest tightness, shortness of breath and wheezing.   Cardiovascular:  Negative for chest pain and palpitations.  Gastrointestinal:  Negative for abdominal pain, constipation, diarrhea, nausea and vomiting.  Genitourinary:  Negative for dysuria, flank pain and frequency.  Musculoskeletal:  Negative for back pain, neck pain and neck stiffness.  Skin:  Positive for wound (abrasions to forehead). Negative for rash.  Neurological:  Positive for headaches. Negative for  dizziness, loss of consciousness, weakness, light-headedness and numbness.  Psychiatric/Behavioral:  Negative for agitation and confusion.   All other systems reviewed and are negative.   Physical Exam Updated Vital Signs BP (!) 182/60 (BP Location: Left Arm)   Pulse 62   Temp (!) 97.4 F (36.3 C) (Temporal)   Resp 14   Ht 5\' 3"  (1.6 m)   Wt 61.7 kg   LMP  (LMP Unknown)   SpO2 99%   BMI 24.09 kg/m  Physical Exam Vitals and nursing note reviewed.  Constitutional:      General: She is not in acute distress.    Appearance: She is well-developed. She is not ill-appearing, toxic-appearing or diaphoretic.  HENT:     Head: Normocephalic and atraumatic.     Right Ear: External ear normal.     Left Ear: External ear normal.     Nose: Nose normal.     Mouth/Throat:     Mouth: Mucous membranes are moist.     Pharynx: No oropharyngeal exudate.  Eyes:     Conjunctiva/sclera: Conjunctivae normal.     Pupils: Pupils are equal, round, and reactive to light.  Cardiovascular:     Rate and Rhythm: Normal rate.     Heart sounds: No murmur heard. Pulmonary:     Effort: No respiratory distress.     Breath sounds: No stridor. No wheezing, rhonchi or rales.  Chest:     Chest wall: No tenderness.  Abdominal:     General: There is no distension.     Tenderness: There is no abdominal tenderness. There is no rebound.  Musculoskeletal:        General: Tenderness, deformity and signs of injury present.     Right elbow: Tenderness present.     Right wrist: Swelling, deformity, tenderness, bony tenderness and snuff box tenderness present. No lacerations. Normal pulse.     Cervical back: Normal range of motion and neck supple.     Comments: Tenderness in right wrist and hand.  Intact sensation and strength.  Pain limiting movement.  Intact pulses.  Good cap refill.  Tenderness going to the elbow as well.  Snuffbox tenderness present.  Skin:    General: Skin is warm.     Findings: No erythema or  rash.  Neurological:     General: No focal deficit present.     Mental Status: She is alert and oriented to person, place, and time.     Sensory: No sensory deficit.     Motor: No weakness or abnormal muscle tone.     Coordination: Coordination normal.     Deep Tendon Reflexes: Reflexes are normal and symmetric.  Psychiatric:        Mood and Affect:  Mood normal.     ED Results / Procedures / Treatments   Labs (all labs ordered are listed, but only abnormal results are displayed) Labs Reviewed - No data to display  EKG EKG Interpretation  Date/Time:  Tuesday October 13 2022 10:53:06 EST Ventricular Rate:  59 PR Interval:  168 QRS Duration: 91 QT Interval:  435 QTC Calculation: 431 R Axis:   55 Text Interpretation: Sinus rhythm Anterior infarct, old when compard to prior, similar appearnce. No STEMI Confirmed by Theda Belfast ([id number]) on 10/13/2022 11:30:59 AM  Radiology DG Elbow 2 Views Right  Result Date: 10/13/2022 CLINICAL DATA:  Pain EXAM: RIGHT ELBOW - 2 VIEW COMPARISON:  None Available. FINDINGS: There is no evidence of fracture, dislocation, or joint effusion. There is no evidence of arthropathy or other focal bone abnormality. Soft tissues are unremarkable. IMPRESSION: Negative. Electronically Signed   By: Layla Maw M.D.   On: 10/13/2022 11:13   CT Head Wo Contrast  Result Date: 10/13/2022 CLINICAL DATA:  Head trauma, intracranial venous injury suspected EXAM: CT HEAD WITHOUT CONTRAST TECHNIQUE: Contiguous axial images were obtained from the base of the skull through the vertex without intravenous contrast. RADIATION DOSE REDUCTION: This exam was performed according to the departmental dose-optimization program which includes automated exposure control, adjustment of the mA and/or kV according to patient size and/or use of iterative reconstruction technique. COMPARISON:  None Available. FINDINGS: Brain: No evidence of acute infarction, hemorrhage, hydrocephalus,  extra-axial collection or mass lesion/mass effect. Multiple subcentimeter calcified lesions along the high left frontal parietal convexity, probably meningiomas. No significant mass effect. Vascular: No hyperdense vessel. Skull: No acute fracture.  Multiple craniotomies. Sinuses/Orbits: Right sphenoid sinus mucosal thickening. Other: No mastoid effusions. IMPRESSION: 1. No evidence of acute intracranial abnormality. 2. Multiple subcentimeter calcified lesions along the high left frontal parietal convexity, probably meningiomas. No significant mass effect. Electronically Signed   By: Feliberto Harts M.D.   On: 10/13/2022 11:10   DG Wrist Complete Right  Result Date: 10/13/2022 CLINICAL DATA:  Fall EXAM: RIGHT WRIST - COMPLETE 4 VIEW; RIGHT HAND - COMPLETE 3 VIEW COMPARISON:  None Available. FINDINGS: Comminuted distal intra articular fracture radius with displacement by few mm. A nondisplaced avulsion fracture of the ulnar styloid process. Osseous structures are otherwise intact. Interphalangeal degenerative changes. No osteolytic or osteoblastic changes. IMPRESSION: Radius and ulna fractures. Electronically Signed   By: Layla Maw M.D.   On: 10/13/2022 11:01   DG Hand Complete Right  Result Date: 10/13/2022 CLINICAL DATA:  Fall EXAM: RIGHT WRIST - COMPLETE 4 VIEW; RIGHT HAND - COMPLETE 3 VIEW COMPARISON:  None Available. FINDINGS: Comminuted distal intra articular fracture radius with displacement by few mm. A nondisplaced avulsion fracture of the ulnar styloid process. Osseous structures are otherwise intact. Interphalangeal degenerative changes. No osteolytic or osteoblastic changes. IMPRESSION: Radius and ulna fractures. Electronically Signed   By: Layla Maw M.D.   On: 10/13/2022 11:01    Procedures Procedures    Medications Ordered in ED Medications  fentaNYL (SUBLIMAZE) injection 50 mcg (50 mcg Intramuscular Given 10/13/22 1047)  HYDROmorphone (DILAUDID) tablet 1 mg (1 mg Oral  Given 10/13/22 1339)    ED Course/ Medical Decision Making/ A&P                           Medical Decision Making Amount and/or Complexity of Data Reviewed Radiology: ordered.  Risk Prescription drug management.    Dannielle Baskins is a 88 y.o.  female with a past medical history significant for trigeminal nerve pain, hypertension, dyslipidemia, depression, CKD, seizure disorder, cholecystectomy, and dabigatran use for previous pulmonary embolism who presents as a level 2 trauma for a fall.  According to patient, she was on a 6 inch stepstool trying to get a pair of socks out of the top drawer of her dresser when she turned and had a mechanical fall hitting her wrist and head on the ground and dresser.  She did not lose consciousness and has a small abrasion to her right forehead.  She is primary complaining of right wrist deformity.  She is right-handed.  She has moderate to severe pain in her right wrist from the hand to the elbow but otherwise denies pain in her neck, chest, abdomen, back, or other extremities.  Due to her blood thinner use and head injury she was made a level 2 trauma.  Patient is complaining of some mild headache right over the abrasion but otherwise is denying nausea, vomiting, vision changes, or significant diffuse headache.  She denies any other complaints aside from the wrist.  On exam, lungs clear and chest nontender.  Airway was intact and breath sounds equal bilaterally.  Chest abdomen nontender.  Back and neck nontender.  Patient has tenderness and deformity to the right wrist denies some tenderness in the dorsum of the right hand.  She was able to wiggle her fingers and had pulses and sensation.  Tenderness in the elbow but no tenderness in the shoulder.  Other extremities unremarkable.  Small abrasion to her right forehead but had normal extract movements and pupils are symmetric and reactive.  Neck nontender.  No other onset of acute trauma.  Had a shared  decision-making conversation with patient and due to her lack of any preceding symptoms, she does not want a large workup but agrees to get a head CT due to her blood thinner use and head injury as well as imaging of her right arm.  She reports that she is "a really hard stick" and does not want to be stuck for IV or blood work at this time.  We will do a IM dose of pain medicine with fentanyl initially.  Will get x-rays and anticipate splinting and orthopedic follow-up if the wrist is the only injury seen.  Anticipate discharge home if workup is otherwise reassuring.  I personally viewed and interpreted the images at the bedside that show distal radius and ulna fractures.  Radiology confirmed they read and does show wrist fractures.  I spoke to orthopedic hand surgery team who recommended sugar-tong splinting, pain control, sling, and outpatient follow-up this week with Dr.Chiarimonti with hand surgery.  Patient will be placed in a splint and anticipate discharge home after.  Patient has been placed in a splint and is feeling better.  She is able to wiggle her fingers and has sensation.  Good capillary refill on assessment.  Patient lives at an independent living facility but they do have some assisted living capabilities.  Case management/social work is trying to help determine if she is a candidate to move over to the assisted living side now that she is right-handed and is not able to use her right arm.  They will direct us for disposition and discharge but anticipate discharge home tonight.  Patient has home Dilaudid which she will use for pain.  A dose of oral pain medicine was given here.        Final Clinical Impression(s) / ED Diagnoses Final  diagnoses:  Fall, initial encounter  Contusion of face, initial encounter  Closed fracture of right wrist, initial encounter    Rx / DC Orders ED Discharge Orders     None        Clinical Impression: 1. Fall, initial encounter   2.  Contusion of face, initial encounter   3. Closed fracture of right wrist, initial encounter     Disposition: Discharge  Condition: Good  I have discussed the results, Dx and Tx plan with the pt(& family if present). He/she/they expressed understanding and agree(s) with the plan. Discharge instructions discussed at great length. Strict return precautions discussed and pt &/or family have verbalized understanding of the instructions. No further questions at time of discharge.    New Prescriptions   No medications on file    Follow Up: Chiaramonti, Edsel Petrin, MD [address] [place] [address] [phone]  Schedule an appointment as soon as possible for a visit in 1 week with hand surgery     Matti Killingsworth, Canary Brim, MD 10/13/22 1459

## 2022-10-13 NOTE — Progress Notes (Signed)
Orthopedic Tech Progress Note Patient Details:  Kristen [name] February 07, 1933 [id number]  Level 2 trauma   Patient ID: Kristen [name], female   DOB: 02-01-1933, 88 y.o.   MRN: [id number]  [name] 10/13/2022, 10:45 AM

## 2022-10-14 DIAGNOSIS — S52614A Nondisplaced fracture of right ulna styloid process, initial encounter for closed fracture: Secondary | ICD-10-CM | POA: Diagnosis not present

## 2022-10-14 DIAGNOSIS — S52571A Other intraarticular fracture of lower end of right radius, initial encounter for closed fracture: Secondary | ICD-10-CM | POA: Diagnosis not present

## 2022-10-28 DIAGNOSIS — G8929 Other chronic pain: Secondary | ICD-10-CM | POA: Diagnosis not present

## 2022-10-28 DIAGNOSIS — D518 Other vitamin B12 deficiency anemias: Secondary | ICD-10-CM | POA: Diagnosis not present

## 2022-10-28 DIAGNOSIS — I272 Pulmonary hypertension, unspecified: Secondary | ICD-10-CM | POA: Diagnosis not present

## 2022-10-28 DIAGNOSIS — S52571A Other intraarticular fracture of lower end of right radius, initial encounter for closed fracture: Secondary | ICD-10-CM | POA: Diagnosis not present

## 2022-10-28 DIAGNOSIS — M159 Polyosteoarthritis, unspecified: Secondary | ICD-10-CM | POA: Diagnosis not present

## 2022-10-28 DIAGNOSIS — E785 Hyperlipidemia, unspecified: Secondary | ICD-10-CM | POA: Diagnosis not present

## 2022-10-28 DIAGNOSIS — G509 Disorder of trigeminal nerve, unspecified: Secondary | ICD-10-CM | POA: Diagnosis not present

## 2022-10-28 DIAGNOSIS — I10 Essential (primary) hypertension: Secondary | ICD-10-CM | POA: Diagnosis not present

## 2022-10-28 DIAGNOSIS — D509 Iron deficiency anemia, unspecified: Secondary | ICD-10-CM | POA: Diagnosis not present

## 2022-10-28 DIAGNOSIS — M810 Age-related osteoporosis without current pathological fracture: Secondary | ICD-10-CM | POA: Diagnosis not present

## 2022-10-28 DIAGNOSIS — N1832 Chronic kidney disease, stage 3b: Secondary | ICD-10-CM | POA: Diagnosis not present

## 2022-10-28 DIAGNOSIS — F419 Anxiety disorder, unspecified: Secondary | ICD-10-CM | POA: Diagnosis not present

## 2022-10-28 DIAGNOSIS — F329 Major depressive disorder, single episode, unspecified: Secondary | ICD-10-CM | POA: Diagnosis not present

## 2022-10-30 DIAGNOSIS — N1832 Chronic kidney disease, stage 3b: Secondary | ICD-10-CM | POA: Diagnosis not present

## 2022-10-30 DIAGNOSIS — K219 Gastro-esophageal reflux disease without esophagitis: Secondary | ICD-10-CM | POA: Diagnosis not present

## 2022-10-30 DIAGNOSIS — M159 Polyosteoarthritis, unspecified: Secondary | ICD-10-CM | POA: Diagnosis not present

## 2022-10-30 DIAGNOSIS — I272 Pulmonary hypertension, unspecified: Secondary | ICD-10-CM | POA: Diagnosis not present

## 2022-10-30 DIAGNOSIS — F329 Major depressive disorder, single episode, unspecified: Secondary | ICD-10-CM | POA: Diagnosis not present

## 2022-10-30 DIAGNOSIS — M810 Age-related osteoporosis without current pathological fracture: Secondary | ICD-10-CM | POA: Diagnosis not present

## 2022-10-30 DIAGNOSIS — E785 Hyperlipidemia, unspecified: Secondary | ICD-10-CM | POA: Diagnosis not present

## 2022-10-30 DIAGNOSIS — G8929 Other chronic pain: Secondary | ICD-10-CM | POA: Diagnosis not present

## 2022-11-09 ENCOUNTER — Other Ambulatory Visit (HOSPITAL_COMMUNITY): Payer: BC Managed Care – PPO

## 2022-11-09 ENCOUNTER — Ambulatory Visit (HOSPITAL_BASED_OUTPATIENT_CLINIC_OR_DEPARTMENT_OTHER): Payer: Medicare Other

## 2022-11-13 DIAGNOSIS — S52571D Other intraarticular fracture of lower end of right radius, subsequent encounter for closed fracture with routine healing: Secondary | ICD-10-CM | POA: Diagnosis not present

## 2022-11-13 DIAGNOSIS — S52614D Nondisplaced fracture of right ulna styloid process, subsequent encounter for closed fracture with routine healing: Secondary | ICD-10-CM | POA: Diagnosis not present

## 2022-11-25 DIAGNOSIS — M25631 Stiffness of right wrist, not elsewhere classified: Secondary | ICD-10-CM | POA: Diagnosis not present

## 2022-11-25 DIAGNOSIS — M25531 Pain in right wrist: Secondary | ICD-10-CM | POA: Diagnosis not present

## 2022-11-25 DIAGNOSIS — M25431 Effusion, right wrist: Secondary | ICD-10-CM | POA: Diagnosis not present

## 2022-11-25 DIAGNOSIS — S52571D Other intraarticular fracture of lower end of right radius, subsequent encounter for closed fracture with routine healing: Secondary | ICD-10-CM | POA: Diagnosis not present

## 2022-11-25 DIAGNOSIS — S52614D Nondisplaced fracture of right ulna styloid process, subsequent encounter for closed fracture with routine healing: Secondary | ICD-10-CM | POA: Diagnosis not present

## 2022-11-26 DIAGNOSIS — G9389 Other specified disorders of brain: Secondary | ICD-10-CM | POA: Diagnosis not present

## 2022-11-26 DIAGNOSIS — Z923 Personal history of irradiation: Secondary | ICD-10-CM | POA: Diagnosis not present

## 2022-11-26 DIAGNOSIS — G500 Trigeminal neuralgia: Secondary | ICD-10-CM | POA: Diagnosis not present

## 2022-11-30 ENCOUNTER — Ambulatory Visit (HOSPITAL_BASED_OUTPATIENT_CLINIC_OR_DEPARTMENT_OTHER)
Admission: RE | Admit: 2022-11-30 | Discharge: 2022-11-30 | Disposition: A | Discharge status: Home / Self Care | Payer: Medicare Other | Source: Ambulatory Visit | Attending: Nurse Practitioner | Admitting: Nurse Practitioner

## 2022-11-30 DIAGNOSIS — R911 Solitary pulmonary nodule: Secondary | ICD-10-CM | POA: Diagnosis not present

## 2022-11-30 DIAGNOSIS — R918 Other nonspecific abnormal finding of lung field: Secondary | ICD-10-CM | POA: Diagnosis not present

## 2022-12-03 NOTE — Progress Notes (Signed)
Please notify patient that the nodule in her right lung shows similar size as previous imaging; however, the more solid component has gotten bigger overtime, which warrants further workup or closer follow up. She needs to see Dr. Lamonte Sakai to discuss next steps. He has an opening 2/15 at 1130, if that will work. Thanks!

## 2022-12-09 DIAGNOSIS — S52571D Other intraarticular fracture of lower end of right radius, subsequent encounter for closed fracture with routine healing: Secondary | ICD-10-CM | POA: Diagnosis not present

## 2022-12-21 ENCOUNTER — Other Ambulatory Visit: Payer: BC Managed Care – PPO

## 2022-12-23 ENCOUNTER — Encounter: Payer: Self-pay | Admitting: Emergency Medicine

## 2022-12-23 ENCOUNTER — Ambulatory Visit (INDEPENDENT_AMBULATORY_CARE_PROVIDER_SITE_OTHER): Payer: Medicare Other | Admitting: Emergency Medicine

## 2022-12-23 VITALS — BP 122/74 | HR 58 | Temp 97.8°F | Ht 62.0 in | Wt 137.6 lb

## 2022-12-23 DIAGNOSIS — I2699 Other pulmonary embolism without acute cor pulmonale: Secondary | ICD-10-CM

## 2022-12-23 DIAGNOSIS — J310 Chronic rhinitis: Secondary | ICD-10-CM

## 2022-12-23 DIAGNOSIS — K219 Gastro-esophageal reflux disease without esophagitis: Secondary | ICD-10-CM | POA: Diagnosis not present

## 2022-12-23 DIAGNOSIS — D518 Other vitamin B12 deficiency anemias: Secondary | ICD-10-CM | POA: Diagnosis not present

## 2022-12-23 DIAGNOSIS — G8929 Other chronic pain: Secondary | ICD-10-CM | POA: Diagnosis not present

## 2022-12-23 DIAGNOSIS — M159 Polyosteoarthritis, unspecified: Secondary | ICD-10-CM | POA: Diagnosis not present

## 2022-12-23 DIAGNOSIS — R911 Solitary pulmonary nodule: Secondary | ICD-10-CM

## 2022-12-23 DIAGNOSIS — M810 Age-related osteoporosis without current pathological fracture: Secondary | ICD-10-CM | POA: Diagnosis not present

## 2022-12-23 DIAGNOSIS — I272 Pulmonary hypertension, unspecified: Secondary | ICD-10-CM | POA: Diagnosis not present

## 2022-12-23 DIAGNOSIS — F329 Major depressive disorder, single episode, unspecified: Secondary | ICD-10-CM | POA: Diagnosis not present

## 2022-12-23 DIAGNOSIS — N1832 Chronic kidney disease, stage 3b: Secondary | ICD-10-CM | POA: Diagnosis not present

## 2022-12-23 DIAGNOSIS — E785 Hyperlipidemia, unspecified: Secondary | ICD-10-CM | POA: Diagnosis not present

## 2022-12-23 DIAGNOSIS — G509 Disorder of trigeminal nerve, unspecified: Secondary | ICD-10-CM | POA: Diagnosis not present

## 2022-12-23 NOTE — Progress Notes (Signed)
Subjective:    Patient ID: Kristen [name], female    DOB: 01/05/1933, 89 y.o.   MRN: [id number]  HPI  ROV 12/23/22 --follow-up visit for 89 year old woman, never smoker, with history of hypertension, seizures, B12 deficiency, trigeminal neuralgia, pulmonary embolism.  I saw her following her pulmonary embolism to evaluate this as well as a semisolid right upper lobe density suspicious for possible adenocarcinoma.  In retrospect she probably had a right upper lobe opacity noted on prior CT scans from [facility].  Most recent CT was done 11/30/2022 as below She has nasal congestion, some cough w eating. Has some near aspiration. She has been on flonase and atrovent  CT scan of the chest 11/30/2022 reviewed by me, shows a 2.9 x 1.8 mixed density peripheral right upper lobe pulmonary nodule.  This is predominantly groundglass with a 10 mm lateral solid component and some associated pleural retraction.  The solid component has enlarged slightly compared with 1 year ago 10/2021, suggestive of adenocarcinoma.   Review of Systems As per HPI  Past Medical History:  Diagnosis Date   Anemia    Arthritis    B12 deficiency    Chronic pain disorder    trigeminal neuralgia   Depression    Dyslipidemia    Hypertension    Seizure disorder ([place])    decades since last seizure   Tremor    Trigeminal nerve disorder      No family history on file.  No family hx lung CA   Social History   Socioeconomic History   Marital status: Widowed    Spouse name: Not on file   Number of children: 3   Years of education: Not on file   Highest education level: Not on file  Occupational History   Occupation: retired  Tobacco Use   Smoking status: Never    Passive exposure: Never   Smokeless tobacco: Never  Substance and Sexual Activity   Alcohol use: Not Currently   Drug use: Never   Sexual activity: Not on file  Other Topics Concern   Not on file  Social History Narrative   12/15/21 lives at [place] Strain: Not on file  Food Insecurity: Not on file  Transportation Needs: Not on file  Physical Activity: Not on file  Stress: Not on file  Social Connections: Not on file  Intimate Partner Violence: Not on file    [place], [place], [place], [place] Was a Pharmacist, hospital.  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   Allergies  Allergen Reactions   Penicillins Hives and Rash    Other reaction(s): Other unknown    Tetracyclines & Related Anaphylaxis    Other reaction(s): Other unknown    Sulfa Antibiotics Hives and Other (See Comments)    unknown     Outpatient Medications Prior to Visit  Medication Sig Dispense Refill   atorvastatin (LIPITOR) 20 MG tablet Take 20 mg by mouth daily.     cyanocobalamin (,VITAMIN B-12,) 1000 MCG/ML injection Inject 100 mcg into the muscle every 30 (thirty) days.     diclofenac Sodium (VOLTAREN) 1 % GEL Apply 2 g topically daily. For knee pain     DULoxetine (CYMBALTA) 60 MG capsule Take 60 mg by mouth at bedtime.     esomeprazole (NEXIUM) 40 MG capsule Take 40 mg by mouth daily.     gabapentin (NEURONTIN) 300 MG capsule Take 300 mg by mouth daily.     HYDROmorphone (  DILAUDID) 2 MG tablet Take 2 mg by mouth in the morning and at bedtime.     losartan (COZAAR) 50 MG tablet Take 50 mg by mouth daily.     primidone (MYSOLINE) 50 MG tablet Take 50 mg by mouth at bedtime.     traZODone (DESYREL) 100 MG tablet Take 100 mg by mouth at bedtime.     albuterol (VENTOLIN HFA) 108 (90 Base) MCG/ACT inhaler Inhale 2 puffs into the lungs every 6 (six) hours as needed for wheezing or shortness of breath. (Patient not taking: Reported on 12/23/2022) 8 g 2   dabigatran (PRADAXA) 150 MG CAPS capsule Take 1 capsule (150 mg total) by mouth every 12 (twelve) hours. 60 capsule 2   ferrous sulfate 325 (65 FE) MG tablet Take 1 tablet (325 mg total) by mouth daily. 30 tablet 2   ipratropium (ATROVENT) 0.06 % nasal spray Place 2 sprays into both  nostrils daily. (Patient not taking: Reported on 12/23/2022) 15 mL 2   meloxicam (MOBIC) 15 MG tablet Take 15 mg by mouth daily. (Patient not taking: Reported on 12/15/2021)     nebivolol (BYSTOLIC) 5 MG tablet Take 5 mg by mouth daily. (Patient not taking: Reported on 12/15/2021)     No facility-administered medications prior to visit.        Objective:   Physical Exam  Vitals:   12/23/22 1022  BP: 122/74  Pulse: (!) 58  Temp: 97.8 F (36.6 C)  TempSrc: Oral  SpO2: 98%  Weight: 137 lb 9.6 oz (62.4 kg)  Height: '5\' 2"'$  (1.575 m)   Gen: Pleasant, elderly, well-nourished, in no distress,  normal affect  ENT: No lesions,  mouth clear,  oropharynx clear, no postnasal drip  Neck: No JVD, no stridor  Lungs: No use of accessory muscles, no crackles or wheezing on normal respiration, no wheeze on forced expiration  Cardiovascular: RRR, heart sounds normal, no murmur or gallops, no peripheral edema  Musculoskeletal: No deformities, no cyanosis or clubbing  Neuro: alert, awake, non focal  Skin: Warm, no lesions or rash    Assessment & Plan:   Pulmonary nodule, right Slowly progressive right upper lobe groundglass pulmonary nodule.  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  We had a good discussion about the pros, cons of workup and subsequently treatment if this were the diagnosis.  For now we have elected to remain conservative, continue to follow her clinically and follow with serial imaging.  Even if we do not decide to pursue biopsy or treatment this will likely be beneficial with regard to prognosis, questions regarding progression.  Neck CT scan in 1 year  Pulmonary embolism (HCC) Continue anticoagulation, planning for lifelong  Chronic rhinitis Okay for use for ipratropium (Atrovent) nasal spray, 2 sprays each nostril 2-3 times daily if you need it for nasal congestion, drainage and cough.   Baltazar Apo, MD,  PhD 12/23/2022, 1:20 PM [facility] [phone] or if no answer before 7:00PM call [phone] For any issues after 7:00PM please call eLink [phone]

## 2022-12-23 NOTE — Assessment & Plan Note (Signed)
Continue anticoagulation, planning for lifelong

## 2022-12-23 NOTE — Assessment & Plan Note (Signed)
Okay for use for ipratropium (Atrovent) nasal spray, 2 sprays each nostril 2-3 times daily if you need it for nasal congestion, drainage and cough.

## 2022-12-23 NOTE — Patient Instructions (Signed)
We reviewed your CT scan of the chest today. We will plan to repeat your CT scan in February 2025 to compare with priors. Okay for use for ipratropium (Atrovent) nasal spray, 2 sprays each nostril 2-3 times daily if you need it for nasal congestion, drainage and cough. Follow Dr. Lamonte Sakai in February 2025 to review your scan or sooner if you have any problems.

## 2022-12-23 NOTE — Assessment & Plan Note (Signed)
Slowly progressive right upper lobe groundglass pulmonary nodule.  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  We had a good discussion about the pros, cons of workup and subsequently treatment if this were the diagnosis.  For now we have elected to remain conservative, continue to follow her clinically and follow with serial imaging.  Even if we do not decide to pursue biopsy or treatment this will likely be beneficial with regard to prognosis, questions regarding progression.  Neck CT scan in 1 year

## 2022-12-28 ENCOUNTER — Telehealth: Payer: Self-pay | Admitting: Emergency Medicine

## 2022-12-28 NOTE — Telephone Encounter (Signed)
Manuela Schwartz daughter states patient would like the Atrovent. Pharmacy is [facility]. Manuela Schwartz phone number is [phone].

## 2022-12-30 ENCOUNTER — Other Ambulatory Visit: Payer: Self-pay

## 2022-12-30 DIAGNOSIS — J310 Chronic rhinitis: Secondary | ICD-10-CM

## 2022-12-30 MED ORDER — IPRATROPIUM BROMIDE 0.06 % NA SOLN: 2.0000 | Freq: Every day | NASAL | 3 refills | Status: DC

## 2022-12-30 NOTE — Telephone Encounter (Signed)
Refill for atrovent has been sent to patients pharmacy. Daughter is aware nothing further needed.

## 2023-01-21 DIAGNOSIS — F329 Major depressive disorder, single episode, unspecified: Secondary | ICD-10-CM | POA: Diagnosis not present

## 2023-01-21 DIAGNOSIS — E785 Hyperlipidemia, unspecified: Secondary | ICD-10-CM | POA: Diagnosis not present

## 2023-01-21 DIAGNOSIS — K219 Gastro-esophageal reflux disease without esophagitis: Secondary | ICD-10-CM | POA: Diagnosis not present

## 2023-01-21 DIAGNOSIS — N1832 Chronic kidney disease, stage 3b: Secondary | ICD-10-CM | POA: Diagnosis not present

## 2023-01-21 DIAGNOSIS — D518 Other vitamin B12 deficiency anemias: Secondary | ICD-10-CM | POA: Diagnosis not present

## 2023-01-21 DIAGNOSIS — M810 Age-related osteoporosis without current pathological fracture: Secondary | ICD-10-CM | POA: Diagnosis not present

## 2023-01-21 DIAGNOSIS — G8929 Other chronic pain: Secondary | ICD-10-CM | POA: Diagnosis not present

## 2023-01-21 DIAGNOSIS — M159 Polyosteoarthritis, unspecified: Secondary | ICD-10-CM | POA: Diagnosis not present

## 2023-02-11 DIAGNOSIS — Z1331 Encounter for screening for depression: Secondary | ICD-10-CM | POA: Diagnosis not present

## 2023-02-11 DIAGNOSIS — D649 Anemia, unspecified: Secondary | ICD-10-CM | POA: Diagnosis not present

## 2023-02-11 DIAGNOSIS — I2699 Other pulmonary embolism without acute cor pulmonale: Secondary | ICD-10-CM | POA: Diagnosis not present

## 2023-02-11 DIAGNOSIS — Z23 Encounter for immunization: Secondary | ICD-10-CM | POA: Diagnosis not present

## 2023-02-11 DIAGNOSIS — N1832 Chronic kidney disease, stage 3b: Secondary | ICD-10-CM | POA: Diagnosis not present

## 2023-02-11 DIAGNOSIS — G8929 Other chronic pain: Secondary | ICD-10-CM | POA: Diagnosis not present

## 2023-02-11 DIAGNOSIS — F329 Major depressive disorder, single episode, unspecified: Secondary | ICD-10-CM | POA: Diagnosis not present

## 2023-02-11 DIAGNOSIS — Z0000 Encounter for general adult medical examination without abnormal findings: Secondary | ICD-10-CM | POA: Diagnosis not present

## 2023-02-11 DIAGNOSIS — G509 Disorder of trigeminal nerve, unspecified: Secondary | ICD-10-CM | POA: Diagnosis not present

## 2023-02-11 DIAGNOSIS — E785 Hyperlipidemia, unspecified: Secondary | ICD-10-CM | POA: Diagnosis not present

## 2023-02-11 DIAGNOSIS — R918 Other nonspecific abnormal finding of lung field: Secondary | ICD-10-CM | POA: Diagnosis not present

## 2023-02-11 DIAGNOSIS — I10 Essential (primary) hypertension: Secondary | ICD-10-CM | POA: Diagnosis not present

## 2023-02-11 DIAGNOSIS — M810 Age-related osteoporosis without current pathological fracture: Secondary | ICD-10-CM | POA: Diagnosis not present

## 2023-02-11 DIAGNOSIS — Z79899 Other long term (current) drug therapy: Secondary | ICD-10-CM | POA: Diagnosis not present

## 2023-02-18 DIAGNOSIS — K219 Gastro-esophageal reflux disease without esophagitis: Secondary | ICD-10-CM | POA: Diagnosis not present

## 2023-02-18 DIAGNOSIS — F329 Major depressive disorder, single episode, unspecified: Secondary | ICD-10-CM | POA: Diagnosis not present

## 2023-02-18 DIAGNOSIS — D518 Other vitamin B12 deficiency anemias: Secondary | ICD-10-CM | POA: Diagnosis not present

## 2023-02-18 DIAGNOSIS — M810 Age-related osteoporosis without current pathological fracture: Secondary | ICD-10-CM | POA: Diagnosis not present

## 2023-02-18 DIAGNOSIS — N1832 Chronic kidney disease, stage 3b: Secondary | ICD-10-CM | POA: Diagnosis not present

## 2023-02-18 DIAGNOSIS — G8929 Other chronic pain: Secondary | ICD-10-CM | POA: Diagnosis not present

## 2023-02-18 DIAGNOSIS — M159 Polyosteoarthritis, unspecified: Secondary | ICD-10-CM | POA: Diagnosis not present

## 2023-02-18 DIAGNOSIS — E785 Hyperlipidemia, unspecified: Secondary | ICD-10-CM | POA: Diagnosis not present

## 2023-03-10 ENCOUNTER — Telehealth: Payer: Self-pay | Admitting: Cardiovascular Disease

## 2023-03-10 NOTE — Telephone Encounter (Signed)
   Pre-operative Risk Assessment    Patient Name: Kristen [name]  DOB: [birth year] MRN: [id number]      Request for Surgical Clearance    Procedure:   Routine dental cleaning and possible filling  Date of Surgery:  Clearance TBD                                 Surgeon:  Dr. Rollene Rotunda Surgeon's Group or Practice Name:  [facility]  Phone number:  [phone]  Fax number:  [phone]   Type of Clearance Requested:   - Medical    Type of Anesthesia:  Local    Additional requests/questions:   Caller wants to know if patient will need antibiotics prior to dental procedures for future dental work.  Signed, Annetta Maw   03/10/2023, 8:18 AM

## 2023-03-10 NOTE — Telephone Encounter (Signed)
   Primary Cardiologist: Nanetta Batty, MD  Chart reviewed as part of pre-operative protocol coverage. Simple dental extractions are considered low risk procedures per guidelines and generally do not require any specific cardiac clearance. It is also generally accepted that for simple extractions and dental cleanings, there is no need to interrupt blood thinner therapy.   SBE prophylaxis is not required for the patient.  I will route this recommendation to the requesting party via Epic fax function and remove from pre-op pool.  Please call with questions.  Levi Aland, NP-C  03/10/2023, 1:00 PM [address], [address] Office [phone] Fax [phone]

## 2023-04-14 DIAGNOSIS — H04123 Dry eye syndrome of bilateral lacrimal glands: Secondary | ICD-10-CM | POA: Diagnosis not present

## 2023-04-15 DIAGNOSIS — M159 Polyosteoarthritis, unspecified: Secondary | ICD-10-CM | POA: Diagnosis not present

## 2023-04-15 DIAGNOSIS — F329 Major depressive disorder, single episode, unspecified: Secondary | ICD-10-CM | POA: Diagnosis not present

## 2023-04-15 DIAGNOSIS — G8929 Other chronic pain: Secondary | ICD-10-CM | POA: Diagnosis not present

## 2023-04-15 DIAGNOSIS — E785 Hyperlipidemia, unspecified: Secondary | ICD-10-CM | POA: Diagnosis not present

## 2023-04-15 DIAGNOSIS — K219 Gastro-esophageal reflux disease without esophagitis: Secondary | ICD-10-CM | POA: Diagnosis not present

## 2023-04-15 DIAGNOSIS — N1832 Chronic kidney disease, stage 3b: Secondary | ICD-10-CM | POA: Diagnosis not present

## 2023-04-15 DIAGNOSIS — I272 Pulmonary hypertension, unspecified: Secondary | ICD-10-CM | POA: Diagnosis not present

## 2023-04-27 DIAGNOSIS — I951 Orthostatic hypotension: Secondary | ICD-10-CM | POA: Diagnosis not present

## 2023-04-27 DIAGNOSIS — R0789 Other chest pain: Secondary | ICD-10-CM | POA: Diagnosis not present

## 2023-04-27 DIAGNOSIS — R197 Diarrhea, unspecified: Secondary | ICD-10-CM | POA: Diagnosis not present

## 2023-05-20 DIAGNOSIS — I272 Pulmonary hypertension, unspecified: Secondary | ICD-10-CM | POA: Diagnosis not present

## 2023-05-20 DIAGNOSIS — F329 Major depressive disorder, single episode, unspecified: Secondary | ICD-10-CM | POA: Diagnosis not present

## 2023-05-20 DIAGNOSIS — M810 Age-related osteoporosis without current pathological fracture: Secondary | ICD-10-CM | POA: Diagnosis not present

## 2023-05-20 DIAGNOSIS — K219 Gastro-esophageal reflux disease without esophagitis: Secondary | ICD-10-CM | POA: Diagnosis not present

## 2023-05-20 DIAGNOSIS — N1832 Chronic kidney disease, stage 3b: Secondary | ICD-10-CM | POA: Diagnosis not present

## 2023-05-20 DIAGNOSIS — M159 Polyosteoarthritis, unspecified: Secondary | ICD-10-CM | POA: Diagnosis not present

## 2023-05-20 DIAGNOSIS — G8929 Other chronic pain: Secondary | ICD-10-CM | POA: Diagnosis not present

## 2023-05-20 DIAGNOSIS — E785 Hyperlipidemia, unspecified: Secondary | ICD-10-CM | POA: Diagnosis not present

## 2023-05-20 DIAGNOSIS — D509 Iron deficiency anemia, unspecified: Secondary | ICD-10-CM | POA: Diagnosis not present

## 2023-06-24 DIAGNOSIS — Z03818 Encounter for observation for suspected exposure to other biological agents ruled out: Secondary | ICD-10-CM | POA: Diagnosis not present

## 2023-06-24 DIAGNOSIS — R6883 Chills (without fever): Secondary | ICD-10-CM | POA: Diagnosis not present

## 2023-06-24 DIAGNOSIS — R61 Generalized hyperhidrosis: Secondary | ICD-10-CM | POA: Diagnosis not present

## 2023-06-24 DIAGNOSIS — R110 Nausea: Secondary | ICD-10-CM | POA: Diagnosis not present

## 2023-06-29 DIAGNOSIS — F410 Panic disorder [episodic paroxysmal anxiety] without agoraphobia: Secondary | ICD-10-CM | POA: Diagnosis not present

## 2023-06-29 DIAGNOSIS — G509 Disorder of trigeminal nerve, unspecified: Secondary | ICD-10-CM | POA: Diagnosis not present

## 2023-06-29 DIAGNOSIS — R918 Other nonspecific abnormal finding of lung field: Secondary | ICD-10-CM | POA: Diagnosis not present

## 2023-06-29 DIAGNOSIS — F419 Anxiety disorder, unspecified: Secondary | ICD-10-CM | POA: Diagnosis not present

## 2023-06-29 DIAGNOSIS — Z86711 Personal history of pulmonary embolism: Secondary | ICD-10-CM | POA: Diagnosis not present

## 2023-06-29 DIAGNOSIS — R197 Diarrhea, unspecified: Secondary | ICD-10-CM | POA: Diagnosis not present

## 2023-06-29 DIAGNOSIS — I951 Orthostatic hypotension: Secondary | ICD-10-CM | POA: Diagnosis not present

## 2023-06-29 DIAGNOSIS — I10 Essential (primary) hypertension: Secondary | ICD-10-CM | POA: Diagnosis not present

## 2023-07-09 DIAGNOSIS — R42 Dizziness and giddiness: Secondary | ICD-10-CM | POA: Diagnosis not present

## 2023-07-09 DIAGNOSIS — Z86711 Personal history of pulmonary embolism: Secondary | ICD-10-CM | POA: Diagnosis not present

## 2023-07-09 DIAGNOSIS — I10 Essential (primary) hypertension: Secondary | ICD-10-CM | POA: Diagnosis not present

## 2023-07-09 DIAGNOSIS — R110 Nausea: Secondary | ICD-10-CM | POA: Diagnosis not present

## 2023-07-09 DIAGNOSIS — G509 Disorder of trigeminal nerve, unspecified: Secondary | ICD-10-CM | POA: Diagnosis not present

## 2023-07-09 DIAGNOSIS — F419 Anxiety disorder, unspecified: Secondary | ICD-10-CM | POA: Diagnosis not present

## 2023-07-09 DIAGNOSIS — F410 Panic disorder [episodic paroxysmal anxiety] without agoraphobia: Secondary | ICD-10-CM | POA: Diagnosis not present

## 2023-07-09 DIAGNOSIS — R918 Other nonspecific abnormal finding of lung field: Secondary | ICD-10-CM | POA: Diagnosis not present

## 2023-07-19 ENCOUNTER — Other Ambulatory Visit: Payer: Self-pay | Admitting: Internal Medicine

## 2023-07-19 DIAGNOSIS — R918 Other nonspecific abnormal finding of lung field: Secondary | ICD-10-CM | POA: Diagnosis not present

## 2023-07-19 DIAGNOSIS — R110 Nausea: Secondary | ICD-10-CM

## 2023-07-19 DIAGNOSIS — R42 Dizziness and giddiness: Secondary | ICD-10-CM | POA: Diagnosis not present

## 2023-07-19 DIAGNOSIS — I10 Essential (primary) hypertension: Secondary | ICD-10-CM | POA: Diagnosis not present

## 2023-07-19 DIAGNOSIS — F5101 Primary insomnia: Secondary | ICD-10-CM | POA: Diagnosis not present

## 2023-07-19 DIAGNOSIS — G509 Disorder of trigeminal nerve, unspecified: Secondary | ICD-10-CM | POA: Diagnosis not present

## 2023-07-19 DIAGNOSIS — R109 Unspecified abdominal pain: Secondary | ICD-10-CM

## 2023-07-19 DIAGNOSIS — R630 Anorexia: Secondary | ICD-10-CM | POA: Diagnosis not present

## 2023-07-19 DIAGNOSIS — F411 Generalized anxiety disorder: Secondary | ICD-10-CM | POA: Diagnosis not present

## 2023-07-19 DIAGNOSIS — Z86711 Personal history of pulmonary embolism: Secondary | ICD-10-CM | POA: Diagnosis not present

## 2023-07-22 ENCOUNTER — Other Ambulatory Visit: Payer: Self-pay

## 2023-07-22 DIAGNOSIS — F4321 Adjustment disorder with depressed mood: Secondary | ICD-10-CM

## 2023-07-23 ENCOUNTER — Telehealth: Payer: Self-pay | Admitting: *Deleted

## 2023-07-23 NOTE — Progress Notes (Signed)
Care Coordination   Note   07/23/2023 Name: Kristen [name] MRN: [id number] DOB: 1933-05-29  Kristen [name] is a 89 y.o. year old female who sees Emilio Aspen, MD for primary care. I reached out to Kristen [name] by phone today to offer care coordination services.  Ms. Barbarito was given information about Care Coordination services today including:   The Care Coordination services include support from the care team which includes your Nurse Coordinator, Clinical Social Worker, or Pharmacist.  The Care Coordination team is here to help remove barriers to the health concerns and goals most important to you. Care Coordination services are voluntary, and the patient may decline or stop services at any time by request to their care team member.   Care Coordination Consent Status: Patient daughter Kristen [name] DPR on fileagreed to services and verbal consent obtained.   Follow up plan:  Telephone appointment with care coordination team member scheduled for:  10/8/  Encounter Outcome:  Patient Scheduled  [facility] Coordination Care Guide  Direct Dial: [phone]

## 2023-08-03 ENCOUNTER — Ambulatory Visit: Payer: Self-pay | Admitting: Licensed Clinical Social Worker

## 2023-08-03 ENCOUNTER — Telehealth: Payer: Self-pay | Admitting: Emergency Medicine

## 2023-08-03 DIAGNOSIS — R911 Solitary pulmonary nodule: Secondary | ICD-10-CM

## 2023-08-03 NOTE — Telephone Encounter (Signed)
Darl Pikes daughter would like patient to have CT scan earlier. Having a another CT Abd. Would like to have the same day. Darl Pikes phone number is [phone].

## 2023-08-03 NOTE — Telephone Encounter (Signed)
Yes I think that would be fine

## 2023-08-03 NOTE — Telephone Encounter (Signed)
RB- please advise if okay to add on super d ct to her ct abd appt that she has coming up this wk on 10/10.

## 2023-08-03 NOTE — Telephone Encounter (Signed)
[name] calling again. States mother is having a CT done this Thurs. CT of the abdomen ordered by ANOTHER Dr..   Dr. Delton Coombes wants to order a CT in Feb 2025 of her chest.  Can we call and add to this to a CT sched already this Thursday? Since it is time sensitive I am sending back [facility]. HER Dr. Brayton El w[facility] mentioned for her to ask [place].   Please call Darl Pikes to advise. [phone]

## 2023-08-04 NOTE — Telephone Encounter (Signed)
Order has been placed, [facility] can we please add this to the Ct she is expecting to get tomorrow at [facility]

## 2023-08-05 ENCOUNTER — Other Ambulatory Visit: Payer: Self-pay

## 2023-08-05 ENCOUNTER — Emergency Department (HOSPITAL_BASED_OUTPATIENT_CLINIC_OR_DEPARTMENT_OTHER): Payer: Medicare Other

## 2023-08-05 ENCOUNTER — Encounter (HOSPITAL_BASED_OUTPATIENT_CLINIC_OR_DEPARTMENT_OTHER): Payer: Self-pay | Admitting: Emergency Medicine

## 2023-08-05 ENCOUNTER — Emergency Department (HOSPITAL_BASED_OUTPATIENT_CLINIC_OR_DEPARTMENT_OTHER)
Admission: EM | Admit: 2023-08-05 | Discharge: 2023-08-05 | Disposition: A | Discharge status: Home / Self Care | Payer: Medicare Other | Attending: Emergency Medicine | Admitting: Emergency Medicine

## 2023-08-05 ENCOUNTER — Ambulatory Visit (HOSPITAL_BASED_OUTPATIENT_CLINIC_OR_DEPARTMENT_OTHER): Payer: Medicare Other

## 2023-08-05 ENCOUNTER — Other Ambulatory Visit (HOSPITAL_BASED_OUTPATIENT_CLINIC_OR_DEPARTMENT_OTHER): Payer: Self-pay

## 2023-08-05 ENCOUNTER — Ambulatory Visit (HOSPITAL_BASED_OUTPATIENT_CLINIC_OR_DEPARTMENT_OTHER): Admission: RE | Admit: 2023-08-05 | Payer: Medicare Other | Source: Ambulatory Visit

## 2023-08-05 DIAGNOSIS — R109 Unspecified abdominal pain: Secondary | ICD-10-CM | POA: Diagnosis not present

## 2023-08-05 DIAGNOSIS — J929 Pleural plaque without asbestos: Secondary | ICD-10-CM | POA: Diagnosis not present

## 2023-08-05 DIAGNOSIS — N289 Disorder of kidney and ureter, unspecified: Secondary | ICD-10-CM | POA: Diagnosis not present

## 2023-08-05 DIAGNOSIS — Z79899 Other long term (current) drug therapy: Secondary | ICD-10-CM | POA: Insufficient documentation

## 2023-08-05 DIAGNOSIS — R918 Other nonspecific abnormal finding of lung field: Secondary | ICD-10-CM | POA: Diagnosis not present

## 2023-08-05 DIAGNOSIS — K2900 Acute gastritis without bleeding: Secondary | ICD-10-CM | POA: Diagnosis not present

## 2023-08-05 DIAGNOSIS — I10 Essential (primary) hypertension: Secondary | ICD-10-CM | POA: Diagnosis not present

## 2023-08-05 DIAGNOSIS — D1809 Hemangioma of other sites: Secondary | ICD-10-CM | POA: Diagnosis not present

## 2023-08-05 LAB — URINALYSIS, W/ REFLEX TO CULTURE (INFECTION SUSPECTED)
Bilirubin Urine: NEGATIVE
Glucose, UA: NEGATIVE mg/dL
Ketones, ur: NEGATIVE mg/dL
Nitrite: NEGATIVE
Protein, ur: NEGATIVE mg/dL
Specific Gravity, Urine: 1.015 (ref 1.005–1.030)
pH: 7 (ref 5.0–8.0)

## 2023-08-05 LAB — CBC WITH DIFFERENTIAL/PLATELET
Abs Immature Granulocytes: 0.01 10*3/uL (ref 0.00–0.07)
Basophils Absolute: 0 10*3/uL (ref 0.0–0.1)
Basophils Relative: 1 %
Eosinophils Absolute: 0.1 10*3/uL (ref 0.0–0.5)
Eosinophils Relative: 1 %
HCT: 31 % — ABNORMAL LOW (ref 36.0–46.0)
Hemoglobin: 10.7 g/dL — ABNORMAL LOW (ref 12.0–15.0)
Immature Granulocytes: 0 %
Lymphocytes Relative: 26 %
Lymphs Abs: 1.4 10*3/uL (ref 0.7–4.0)
MCH: 31.8 pg (ref 26.0–34.0)
MCHC: 34.5 g/dL (ref 30.0–36.0)
MCV: 92.3 fL (ref 80.0–100.0)
Monocytes Absolute: 0.4 10*3/uL (ref 0.1–1.0)
Monocytes Relative: 8 %
Neutro Abs: 3.4 10*3/uL (ref 1.7–7.7)
Neutrophils Relative %: 64 %
Platelets: 213 10*3/uL (ref 150–400)
RBC: 3.36 MIL/uL — ABNORMAL LOW (ref 3.87–5.11)
RDW: 11.5 % (ref 11.5–15.5)
WBC: 5.2 10*3/uL (ref 4.0–10.5)
nRBC: 0 % (ref 0.0–0.2)

## 2023-08-05 LAB — LIPASE, BLOOD: Lipase: 13 U/L (ref 11–51)

## 2023-08-05 LAB — COMPREHENSIVE METABOLIC PANEL
ALT: 12 U/L (ref 0–44)
AST: 19 U/L (ref 15–41)
Albumin: 3.6 g/dL (ref 3.5–5.0)
Alkaline Phosphatase: 56 U/L (ref 38–126)
Anion gap: 8 (ref 5–15)
BUN: 13 mg/dL (ref 8–23)
CO2: 28 mmol/L (ref 22–32)
Calcium: 8.5 mg/dL — ABNORMAL LOW (ref 8.9–10.3)
Chloride: 94 mmol/L — ABNORMAL LOW (ref 98–111)
Creatinine, Ser: 1.07 mg/dL — ABNORMAL HIGH (ref 0.44–1.00)
GFR, Estimated: 50 mL/min — ABNORMAL LOW (ref >60)
Glucose, Bld: 93 mg/dL (ref 70–99)
Potassium: 4.1 mmol/L (ref 3.5–5.1)
Sodium: 130 mmol/L — ABNORMAL LOW (ref 135–145)
Total Bilirubin: 0.9 mg/dL (ref 0.3–1.2)
Total Protein: 5.9 g/dL — ABNORMAL LOW (ref 6.5–8.1)

## 2023-08-05 MED ORDER — IOHEXOL 300 MG/ML  SOLN
100.0000 mL | Freq: Once | INTRAMUSCULAR | Status: AC | PRN
  Administered 2023-08-05: 100 mL via INTRAVENOUS

## 2023-08-05 MED ORDER — HYDROMORPHONE HCL 1 MG/ML IJ SOLN
0.5000 mg | Freq: Once | INTRAMUSCULAR | Status: AC
  Administered 2023-08-05: 0.5 mg via INTRAVENOUS
  Filled 2023-08-05: qty 1

## 2023-08-05 MED ORDER — METOCLOPRAMIDE HCL 5 MG/ML IJ SOLN
10.0000 mg | Freq: Once | INTRAMUSCULAR | Status: AC
  Administered 2023-08-05: 10 mg via INTRAVENOUS
  Filled 2023-08-05: qty 2

## 2023-08-05 MED ORDER — SUCRALFATE 1 G PO TABS
1.0000 g | ORAL_TABLET | Freq: Three times a day (TID) | ORAL | 0 refills | Status: DC
  Filled 2023-08-05: qty 60, 15d supply, fill #0

## 2023-08-05 MED ORDER — LACTATED RINGERS IV SOLN: INTRAVENOUS | Status: DC

## 2023-08-05 MED ORDER — PANTOPRAZOLE SODIUM 40 MG IV SOLR
40.0000 mg | Freq: Once | INTRAVENOUS | Status: AC
  Administered 2023-08-05: 40 mg via INTRAVENOUS
  Filled 2023-08-05: qty 10

## 2023-08-05 MED ORDER — METOCLOPRAMIDE HCL 10 MG PO TABS
10.0000 mg | ORAL_TABLET | Freq: Four times a day (QID) | ORAL | 0 refills | Status: DC
  Filled 2023-08-05: qty 20, 5d supply, fill #0

## 2023-08-05 NOTE — Patient Instructions (Signed)
Visit Information  Thank you for taking time to visit with me today. Please don't hesitate to contact me if I can be of assistance to you.   Following are the goals we discussed today:   Goals Addressed             This Visit's Progress    Obtain Supportive Resources-Caregiver Strain   On track    Activities and task to complete in order to accomplish goals.   Keep all upcoming appointments discussed today Continue with compliance of taking medication prescribed by Doctor Implement healthy coping skills discussed to assist with management of symptoms         Our next appointment is by telephone on 10/25 at 11 AM  Please call the care guide team at [phone] if you need to cancel or reschedule your appointment.   If you are experiencing a Mental Health or Behavioral Health Crisis or need someone to talk to, please call the Suicide and Crisis Lifeline: 988 call 911   Patient verbalizes understanding of instructions and care plan provided today and agrees to view in MyChart. Active MyChart status and patient understanding of how to access instructions and care plan via MyChart confirmed with patient.     Jenel Lucks, MSW, LCSW [facility] Care Management [place]  [facility] [email or web address] Phone [phone] 6:48 PM

## 2023-08-05 NOTE — ED Provider Notes (Signed)
[facility] Provider Note   CSN: [id number] Arrival date & time: 08/05/23  4098     History  Chief Complaint  Patient presents with   Abdominal Pain    Kristen [name] is a 89 y.o. female.  Patient is an 89 year old female with a history of trigeminal neuralgia on Dilaudid, anemia, hypertension and dyslipidemia who is status postcholecystectomy and abdominal hysterectomy presenting today with abdominal pain.  Patient's daughter is also present and helps give the history.  Patient states that she has had abdominal pain for about a week which she describes from the top of her abdomen all the way down through the middle.  It is a very uncomfortable pain that is only getting worse.  She has nausea with it but has not had any vomiting.  Bowel movements have been normal.  Denies any black stool.  She has not had a fever cough or congestion.  She denies any shortness of breath.  Eating does seem to make the pain worse so she has had decreased appetite.  She denies any urinary symptoms.  She did talk with her doctor who had ordered a scan of her abdomen for this afternoon but because the pain was worsening they did not feel comfortable waiting.  Also she has a concern for a cancerous spot in her lung and they were planning on doing the CAT scan of her lung as well.  Her daughter reports that she did give her a Dilaudid this morning for the pain in her stomach even though she takes it for her trigeminal neuralgia which has seemed to help some.  She has been compliant with her PPI.  The history is provided by the patient and a relative.  Abdominal Pain      Home Medications Prior to Admission medications   Medication Sig Start Date End Date Taking? Authorizing Provider  metoCLOPramide (REGLAN) 10 MG tablet Take 1 tablet (10 mg total) by mouth every 6 (six) hours. 08/05/23  Yes Joylene Wescott, Alphonzo Lemmings, MD  sucralfate (CARAFATE) 1 g tablet Take 1 tablet (1 g  total) by mouth 4 (four) times daily -  with meals and at bedtime. 08/05/23  Yes Regina Ganci, Alphonzo Lemmings, MD  albuterol (VENTOLIN HFA) 108 (90 Base) MCG/ACT inhaler Inhale 2 puffs into the lungs every 6 (six) hours as needed for wheezing or shortness of breath. Patient not taking: Reported on 12/23/2022 05/20/22   Noemi Chapel, NP  atorvastatin (LIPITOR) 20 MG tablet Take 20 mg by mouth daily. 09/26/21   [provider]  cyanocobalamin (,VITAMIN B-12,) 1000 MCG/ML injection Inject 100 mcg into the muscle every 30 (thirty) days. 11/02/19   [provider]  dabigatran (PRADAXA) 150 MG CAPS capsule Take 1 capsule (150 mg total) by mouth every 12 (twelve) hours. 11/26/21 02/24/22  [place], Alvira Philips, DO  diclofenac Sodium (VOLTAREN) 1 % GEL Apply 2 g topically daily. For knee pain    [provider]  DULoxetine (CYMBALTA) 60 MG capsule Take 60 mg by mouth at bedtime. 12/05/21   [provider]  esomeprazole (NEXIUM) 40 MG capsule Take 40 mg by mouth daily. 09/26/21   [provider]  ferrous sulfate 325 (65 FE) MG tablet Take 1 tablet (325 mg total) by mouth daily. 11/26/21 02/24/22  [place], Alvira Philips, DO  gabapentin (NEURONTIN) 300 MG capsule Take 300 mg by mouth daily. 08/29/21   [provider]  HYDROmorphone (DILAUDID) 2 MG tablet Take 2 mg by mouth in  the morning and at bedtime. 10/03/21   [provider]  ipratropium (ATROVENT) 0.06 % nasal spray Place 2 sprays into both nostrils daily. 12/30/22   Cobb, Ruby Cola, NP  losartan (COZAAR) 50 MG tablet Take 50 mg by mouth daily. 07/28/21   [provider]  meloxicam (MOBIC) 15 MG tablet Take 15 mg by mouth daily. Patient not taking: Reported on 12/15/2021    [provider]  nebivolol (BYSTOLIC) 5 MG tablet Take 5 mg by mouth daily. Patient not taking: Reported on 12/15/2021    [provider]  primidone (MYSOLINE) 50 MG tablet Take 50 mg by mouth at bedtime. 08/26/21   [provider]  traZODone (DESYREL) 100 MG tablet Take 100 mg by mouth at bedtime. 08/21/21   [provider]      Allergies    Penicillins, Tetracyclines & related, and Sulfa antibiotics    Review of Systems   Review of Systems  Gastrointestinal:  Positive for abdominal pain.    Physical Exam Updated Vital Signs BP (!) 169/63   Pulse 74   Temp 98.6 F (37 C) (Oral)   Resp 16   Ht 5\' 2"  (1.575 m)   Wt 56.7 kg   LMP  (LMP Unknown)   SpO2 100%   BMI 22.86 kg/m  Physical Exam Vitals and nursing note reviewed.  Constitutional:      General: She is not in acute distress.    Appearance: She is well-developed.  HENT:     Head: Normocephalic and atraumatic.     Mouth/Throat:     Mouth: Mucous membranes are dry.  Eyes:     Pupils: Pupils are equal, round, and reactive to light.  Cardiovascular:     Rate and Rhythm: Normal rate and regular rhythm.     Heart sounds: Normal heart sounds. No murmur heard.    No friction rub.  Pulmonary:     Effort: Pulmonary effort is normal.     Breath sounds: Normal breath sounds. No wheezing or rales.  Abdominal:     General: Bowel sounds are normal. There is no distension.     Palpations: Abdomen is soft.     Tenderness: There is abdominal tenderness in the right lower quadrant, epigastric area and periumbilical area. There is no right CVA tenderness, left CVA tenderness, guarding or rebound.  Musculoskeletal:        General: No tenderness. Normal range of motion.     Right lower leg: No edema.     Left lower leg: No edema.     Comments: No edema  Skin:    General: Skin is warm and dry.     Coloration: Skin is pale.     Findings: No rash.  Neurological:     Mental Status: She is alert and oriented to person, place, and time.     Cranial Nerves: No cranial nerve deficit.  Psychiatric:        Behavior: Behavior normal.     ED Results / Procedures / Treatments   Labs (all labs ordered are listed, but only abnormal  results are displayed) Labs Reviewed  CBC WITH DIFFERENTIAL/PLATELET - Abnormal; Notable for the following components:      Result Value   RBC 3.36 (*)    Hemoglobin 10.7 (*)    HCT 31.0 (*)    All other components within normal limits  COMPREHENSIVE METABOLIC PANEL - Abnormal; Notable for the following components:   Sodium 130 (*)  Chloride 94 (*)    Creatinine, Ser 1.07 (*)    Calcium 8.5 (*)    Total Protein 5.9 (*)    GFR, Estimated 50 (*)    All other components within normal limits  URINALYSIS, W/ REFLEX TO CULTURE (INFECTION SUSPECTED) - Abnormal; Notable for the following components:   APPearance CLOUDY (*)    Hgb urine dipstick TRACE (*)    Leukocytes,Ua MODERATE (*)    Bacteria, UA FEW (*)    All other components within normal limits  URINE CULTURE  LIPASE, BLOOD    EKG EKG Interpretation Date/Time:  Thursday August 05 2023 09:02:17 EDT Ventricular Rate:  58 PR Interval:  173 QRS Duration:  92 QT Interval:  430 QTC Calculation: 423 R Axis:   66  Text Interpretation: Sinus or ectopic atrial rhythm Atrial premature complex Anterior infarct, old No significant change since last tracing Confirmed by Gwyneth Sprout ([id number]) on 08/05/2023 9:19:59 AM  Radiology CT Super D Chest Wo Contrast  Result Date: 08/05/2023 CLINICAL DATA:  Abdominal pain. Follow-up of pulmonary nodule. * Tracking Code: BO * EXAM: CT CHEST WITHOUT, ABDOMEN AND PELVIS WITH CONTRAST TECHNIQUE: Multidetector CT imaging of the chest was performed using thin slice collimation for electromagnetic bronchoscopy planning purposes, without intravenous contrast.Multidetector CT abdomen and pelvis was performed following the standard protocol during bolus administration of intravenous contrast. RADIATION DOSE REDUCTION: This exam was performed according to the departmental dose-optimization program which includes automated exposure control, adjustment of the mA and/or kV according to patient size and/or  use of iterative reconstruction technique. CONTRAST:  OMNIPAQUE IOHEXOL 300 MG/ML  SOLN COMPARISON:  Chest CT of 11/30/2022. Abdominopelvic CT of 11/23/2021. FINDINGS: CT CHEST FINDINGS Cardiovascular: Aortic atherosclerosis. Mild cardiomegaly, without pericardial effusion. Lad and right coronary artery calcification. Pulmonary artery enlargement, outflow tract 3.0 cm. Mediastinum/Nodes: No mediastinal or hilar adenopathy, given limitations of unenhanced CT. Lungs/Pleura: No pleural fluid. Trace right-sided pleural thickening. Mixed attenuation peripheral right upper lobe pulmonary lesion. Measures 2.9 x 2.0 cm on 38/302 versus 2.9 x 1.9 cm on the prior exam when measured in a similar fashion. The solid component posteriorly and laterally is estimated at 11 x 10 mm on 35/302, similar. Based on coronal reformats, the lesion measures 3.3 cm on image 69 versus 3.2 cm on the prior. Dominant solid nodule measures 10 mm craniocaudal on coronal image 66 versus 9 mm on the prior. A suspected developing solid component superiorly at 6 mm on 27/3 102 versus 4 mm on 26/4 of the prior. Minimal right middle lobe peribronchovascular tree-in-bud nodularity on 67/302 is new. Similar findings in the right lower lobe on 60/302. 3 mm posterior left upper lobe pulmonary nodule is unchanged. Lateral left lower lobe scarring. Musculoskeletal: No acute osseous abnormality. Lower cervical spondylosis. T11 eccentric right vertebral hemangioma. CT ABDOMEN PELVIS FINDINGS Hepatobiliary: No suspicious liver lesion. Cholecystectomy. Mild intrahepatic biliary duct dilatation is unchanged. The common duct measures maximally 1.4 cm in the upper pancreatic head, similar. Tapers gradually in the region of the ampulla. No obstructive stone or mass. Pancreas: Normal, without mass or ductal dilatation. Spleen: Normal in size, without focal abnormality. Adrenals/Urinary Tract: Normal adrenal glands. Left larger than right low-density renal  lesions. On the left, 2.3 cm lesion is consistent with a cyst. On the right, a lower pole 1.7 cm lesion demonstrates calcification in its wall and measures slightly greater than fluid density including on 32/301. This is similar in size to comparison and is favored to represent a complex  cyst. Bilateral too small to characterize renal lesions are most likely cysts . In the absence of clinically indicated signs/symptoms require(s) no independent follow-up. Normal urinary bladder. No hydronephrosis. Stomach/Bowel: The gastric cardia and body are underdistended but appear thick walled, including on 14/3 101. This is new. Normal colon and terminal ileum.  Normal small bowel. Vascular/Lymphatic: Advanced aortic and branch vessel atherosclerosis. No abdominopelvic adenopathy. Reproductive: Hysterectomy.  No adnexal mass. Other: Trace pelvic fluid is new including on 63/301. Again identified is a linear radiopaque adjacent the posterior cysto/urethral junction including on 74/301. No free intraperitoneal air. Musculoskeletal: Lumbosacral spondylosis.  L3 vertebral hemangioma. IMPRESSION: CT CHEST IMPRESSION 1. Mixed attenuation right upper lobe pulmonary mass measures similar to minimally larger. The previously described solid component is similar. There may be a developing more cephalad solid component. Findings remain highly suspicious for adenocarcinoma. 2. No thoracic adenopathy 3. Mild areas of right-sided "tree-in-bud" nodularity likely represent interval infectious bronchiolitis 4. Pulmonary artery enlargement suggests pulmonary arterial hypertension. 5. Coronary artery atherosclerosis. Aortic Atherosclerosis (ICD10-I70.0). CT ABDOMEN AND PELVIS IMPRESSION 1. Gastric wall thickening which is suspicious for gastritis. 2. No other explanation for pain 3. Cholecystectomy with similar mild to moderate common duct dilatation. Correlate with bilirubin levels. If elevated, this could be further evaluated with MRCP. 4.  Similar radiopaque density within the deep pelvis, likely postoperative. Correlate with surgical history. 5. New trace free pelvic fluid Electronically Signed   By: Jeronimo Greaves M.D.   On: 08/05/2023 11:54   CT ABDOMEN PELVIS W CONTRAST  Result Date: 08/05/2023 CLINICAL DATA:  Abdominal pain. Follow-up of pulmonary nodule. * Tracking Code: BO * EXAM: CT CHEST WITHOUT, ABDOMEN AND PELVIS WITH CONTRAST TECHNIQUE: Multidetector CT imaging of the chest was performed using thin slice collimation for electromagnetic bronchoscopy planning purposes, without intravenous contrast.Multidetector CT abdomen and pelvis was performed following the standard protocol during bolus administration of intravenous contrast. RADIATION DOSE REDUCTION: This exam was performed according to the departmental dose-optimization program which includes automated exposure control, adjustment of the mA and/or kV according to patient size and/or use of iterative reconstruction technique. CONTRAST:  OMNIPAQUE IOHEXOL 300 MG/ML  SOLN COMPARISON:  Chest CT of 11/30/2022. Abdominopelvic CT of 11/23/2021. FINDINGS: CT CHEST FINDINGS Cardiovascular: Aortic atherosclerosis. Mild cardiomegaly, without pericardial effusion. Lad and right coronary artery calcification. Pulmonary artery enlargement, outflow tract 3.0 cm. Mediastinum/Nodes: No mediastinal or hilar adenopathy, given limitations of unenhanced CT. Lungs/Pleura: No pleural fluid. Trace right-sided pleural thickening. Mixed attenuation peripheral right upper lobe pulmonary lesion. Measures 2.9 x 2.0 cm on 38/302 versus 2.9 x 1.9 cm on the prior exam when measured in a similar fashion. The solid component posteriorly and laterally is estimated at 11 x 10 mm on 35/302, similar. Based on coronal reformats, the lesion measures 3.3 cm on image 69 versus 3.2 cm on the prior. Dominant solid nodule measures 10 mm craniocaudal on coronal image 66 versus 9 mm on the prior. A suspected developing  solid component superiorly at 6 mm on 27/3 102 versus 4 mm on 26/4 of the prior. Minimal right middle lobe peribronchovascular tree-in-bud nodularity on 67/302 is new. Similar findings in the right lower lobe on 60/302. 3 mm posterior left upper lobe pulmonary nodule is unchanged. Lateral left lower lobe scarring. Musculoskeletal: No acute osseous abnormality. Lower cervical spondylosis. T11 eccentric right vertebral hemangioma. CT ABDOMEN PELVIS FINDINGS Hepatobiliary: No suspicious liver lesion. Cholecystectomy. Mild intrahepatic biliary duct dilatation is unchanged. The common duct measures maximally 1.4 cm in the upper  pancreatic head, similar. Tapers gradually in the region of the ampulla. No obstructive stone or mass. Pancreas: Normal, without mass or ductal dilatation. Spleen: Normal in size, without focal abnormality. Adrenals/Urinary Tract: Normal adrenal glands. Left larger than right low-density renal lesions. On the left, 2.3 cm lesion is consistent with a cyst. On the right, a lower pole 1.7 cm lesion demonstrates calcification in its wall and measures slightly greater than fluid density including on 32/301. This is similar in size to comparison and is favored to represent a complex cyst. Bilateral too small to characterize renal lesions are most likely cysts . In the absence of clinically indicated signs/symptoms require(s) no independent follow-up. Normal urinary bladder. No hydronephrosis. Stomach/Bowel: The gastric cardia and body are underdistended but appear thick walled, including on 14/3 101. This is new. Normal colon and terminal ileum.  Normal small bowel. Vascular/Lymphatic: Advanced aortic and branch vessel atherosclerosis. No abdominopelvic adenopathy. Reproductive: Hysterectomy.  No adnexal mass. Other: Trace pelvic fluid is new including on 63/301. Again identified is a linear radiopaque adjacent the posterior cysto/urethral junction including on 74/301. No free intraperitoneal air.  Musculoskeletal: Lumbosacral spondylosis.  L3 vertebral hemangioma. IMPRESSION: CT CHEST IMPRESSION 1. Mixed attenuation right upper lobe pulmonary mass measures similar to minimally larger. The previously described solid component is similar. There may be a developing more cephalad solid component. Findings remain highly suspicious for adenocarcinoma. 2. No thoracic adenopathy 3. Mild areas of right-sided "tree-in-bud" nodularity likely represent interval infectious bronchiolitis 4. Pulmonary artery enlargement suggests pulmonary arterial hypertension. 5. Coronary artery atherosclerosis. Aortic Atherosclerosis (ICD10-I70.0). CT ABDOMEN AND PELVIS IMPRESSION 1. Gastric wall thickening which is suspicious for gastritis. 2. No other explanation for pain 3. Cholecystectomy with similar mild to moderate common duct dilatation. Correlate with bilirubin levels. If elevated, this could be further evaluated with MRCP. 4. Similar radiopaque density within the deep pelvis, likely postoperative. Correlate with surgical history. 5. New trace free pelvic fluid Electronically Signed   By: Jeronimo Greaves M.D.   On: 08/05/2023 11:54    Procedures Procedures    Medications Ordered in ED Medications  metoCLOPramide (REGLAN) injection 10 mg (10 mg Intravenous Given 08/05/23 1008)  pantoprazole (PROTONIX) injection 40 mg (40 mg Intravenous Given 08/05/23 1009)  HYDROmorphone (DILAUDID) injection 0.5 mg (0.5 mg Intravenous Given 08/05/23 1008)  iohexol (OMNIPAQUE) 300 MG/ML solution 100 mL (100 mLs Intravenous Contrast Given 08/05/23 1107)    ED Course/ Medical Decision Making/ A&P                                 Medical Decision Making Amount and/or Complexity of Data Reviewed Independent Historian: caregiver External Data Reviewed: notes. Labs: ordered. Decision-making details documented in ED Course. Radiology: ordered and independent interpretation performed. Decision-making details documented in ED  Course. ECG/medicine tests: ordered and independent interpretation performed. Decision-making details documented in ED Course.  Risk Prescription drug management.   Pt with multiple medical problems and comorbidities and presenting today with a complaint that caries a high risk for morbidity and mortality.  Here with nonspecific abdominal pain but no focal rebound or guarding.  Concern for gastritis versus diverticulitis versus pancreatitis versus appendicitis.  Lower suspicion for urinary causes such as UTI, pyelonephritis or kidney stone.  Lower suspicion for pulmonary pathology.  Lab work is pending.  Will give pain and nausea control.  Patient is supposed to get a CT of her chest and abdomen later today and we  will order those test here so she does not have to get them later also will be helpful to rule out above processes related to her worsening pain.  1:19 PM I independently interpreted patient's labs and EKG.  EKG with no acute findings.  CBC, CMP, lipase are all within normal limits except for mild hyponatremia of 130 today most likely related to some mild dehydration.  UA without concern for UTI. I have independently visualized and interpreted pt's images today.  CT today without evidence of obstructing stone, hydronephrosis or bowel obstruction.  Mass still noted in the right lobe.  Radiology reports mixed attenuation right upper lobe pulmonary mass similar to minimally larger concerning for adenocarcinoma with mild areas of right sided tree-in-bud nodularity.  Abdominal imaging showed gastric wall thickening suspicious for gastritis but no other acute findings.  Patient's symptoms are most suspicious for gastritis on exam as well as she has pain in her epigastric area.  She does take a PPI already 40 mg/day will have her double that and take it twice daily.  Discussed making sure she is eating regularly and avoiding specific foods and caffeine.  Also patient started on Carafate for a short time  to see if that helps with her nausea and pain as well.  To follow-up with her PCP next week and may need to see gastroenterology in the future if symptoms persist.  Discussed the findings with the patient and her daughter.  They are comfortable with this plan.         Final Clinical Impression(s) / ED Diagnoses Final diagnoses:  Acute gastritis without hemorrhage, unspecified gastritis type  Mass of lung    Rx / DC Orders ED Discharge Orders          Ordered    metoCLOPramide (REGLAN) 10 MG tablet  Every 6 hours        08/05/23 1319    sucralfate (CARAFATE) 1 g tablet  3 times daily with meals & bedtime        08/05/23 1319              Gwyneth Sprout, MD 08/05/23 1319

## 2023-08-05 NOTE — ED Triage Notes (Signed)
Generalized Abdominal pain with nausea for several weeks.  Intermittent epigastric pain.  Pt took exlax 2 days ago so she hast been having loose stools.  Pt is also missing a small bridge.

## 2023-08-05 NOTE — Patient Outreach (Signed)
Care Coordination   Initial Visit Note   08/03/2023 Name: Kristen [name] MRN: [id number] DOB: [birth year]  Kristen [name] is a 89 y.o. year old female who sees Kristen Aspen, MD for primary care. I spoke with  Kristen [name] daughter by phone today.  What matters to the patients health and wellness today?  Symptom Management    Goals Addressed             This Visit's Progress    Obtain Supportive Resources-Caregiver Strain   On track    Activities and task to complete in order to accomplish goals.   Keep all upcoming appointments discussed today Continue with compliance of taking medication prescribed by Doctor Implement healthy coping skills discussed to assist with management of symptoms         SDOH assessments and interventions completed:  Yes  SDOH Interventions Today    Flowsheet Row Most Recent Value  SDOH Interventions   Food Insecurity Interventions Intervention Not Indicated  Housing Interventions Intervention Not Indicated  Transportation Interventions Intervention Not Indicated        Care Coordination Interventions:  Yes, provided  Interventions Today    Flowsheet Row Most Recent Value  Chronic Disease   Chronic disease during today's visit Hypertension (HTN), Chronic Kidney Disease/End Stage Renal Disease (ESRD), Other  [Chronic Pain Disorder]  General Interventions   General Interventions Discussed/Reviewed General Interventions Discussed, Doctor Visits, Level of Care  Doctor Visits Discussed/Reviewed Doctor Visits Discussed  Level of Care --  [Patient resides in [facility]. She is not happy with current residence noting she had similar feelings regarding previous residence.]  Mental Health Interventions   Mental Health Discussed/Reviewed Mental Health Discussed, Coping Strategies, Anxiety, Grief and Loss  [Pt has hx of panic attacks, practices breathing exercises to assist with management of symptoms. Spouse completed suicide  in 2014 resulting in withdrawn behavior. Patient discontinued driving a few weeks ago. Resistant to counseling/med management]  Nutrition Interventions   Nutrition Discussed/Reviewed Nutrition Discussed, Supplemental nutrition  [Patient has decreased appetite. She recently signed up for two meals in current plan at independent living. Drinks boost]  Pharmacy Interventions   Pharmacy Dicussed/Reviewed Pharmacy Topics Discussed, Medication Adherence  Safety Interventions   Safety Discussed/Reviewed Safety Discussed       Follow up plan: Follow up call scheduled for 2-4 weeks    Encounter Outcome:  Patient Visit Completed   Jenel Lucks, MSW, LCSW [facility] Care Management [facility]  [facility] [email or web address] Phone [phone] 6:46 PM

## 2023-08-05 NOTE — Discharge Instructions (Addendum)
The blood work today looks normal except for your sodium levels were a little bit low most likely from not eating enough.  The CAT scan did show that same spot on the right side of your lung which looks about the same in size as in the past.  It also showed that the lining of your stomach was inflamed.  We are going to have you increase your esomeprazole 40 mg to twice a day for the next 2 weeks and you can also start taking the Carafate with meals and before you go to bed.  Stop taking the Zofran as it is not helping with your nausea and try the metoclopramide instead.  If you start having fever, persistent vomiting, any blood in your emesis return to the emergency room.  If the pain is not improved by next week you need to follow-up with your doctor and they will most likely need to refer you to a specialist.

## 2023-08-06 LAB — URINE CULTURE: Culture: 30000 — AB

## 2023-08-07 ENCOUNTER — Ambulatory Visit (HOSPITAL_COMMUNITY)
Admission: EM | Admit: 2023-08-07 | Discharge: 2023-08-07 | Disposition: A | Discharge status: Home / Self Care | Payer: Medicare Other

## 2023-08-07 DIAGNOSIS — F419 Anxiety disorder, unspecified: Secondary | ICD-10-CM

## 2023-08-07 NOTE — Discharge Instructions (Addendum)
Discharge recommendations:   Medications: Patient is to take medications as prescribed. The patient or patient's guardian is to contact a medical professional and/or outpatient provider to address any new side effects that develop. The patient or the patient's guardian should update outpatient providers of any new medications and/or medication changes.    Outpatient Follow up: Please review list of outpatient resources for psychiatry and counseling. Please follow up with your primary care provider for all medical related needs.    Therapy: We recommend that patient participate in individual therapy to address mental health concerns.   Atypical antipsychotics: If you are prescribed an atypical antipsychotic, it is recommended that your height, weight, BMI, blood pressure, fasting lipid panel, and fasting blood sugar be monitored by your outpatient providers.  Safety:   The following safety precautions should be taken:   No sharp objects. This includes scissors, razors, scrapers, and putty knives.   Chemicals should be removed and locked up.   Medications should be removed and locked up.   Weapons should be removed and locked up. This includes firearms, knives and instruments that can be used to cause injury.   The patient should abstain from use of illicit substances/drugs and abuse of any medications.  If symptoms worsen or do not continue to improve or if the patient becomes actively suicidal or homicidal then it is recommended that the patient return to the closest hospital emergency department, the [facility], or call 911 for further evaluation and treatment. National Suicide Prevention Lifeline 1-800-SUICIDE or 908-662-5561.  About 988 988 offers 24/7 access to trained crisis counselors who can help people experiencing mental health-related distress. People can call or text 988 or chat 988lifeline.org for themselves or if they are worried about a  loved one who may need crisis support.     Base on the information you have provided and the presenting issue, outpatient services with therapy and psychiatry have been recommended.  It is imperative that you follow through with treatment recommendations within 5-7 days from the of discharge to mitigate further risk to your safety and mental well-being. A list of referrals has been provided below to get you started.  You are not limited to the list provided.  In case of an urgent crisis, you may contact the Mobile Crisis Unit with [facility] at 1.9063497305.  Outpatient Therapy and Psychiatry for Medicare Recipients  [facility] [address] [place], [place], [address] [phone] phone  [facility] [address] [place], [place], [address] [address] phone ([address] [employer] [address] - [place], [address], [place], [place], [employer], [address], [employer] Healthy [place], [place], [address], [place], [place], [place], [employer], [employer], [employer], [employer], [place])  [facility] [address] [place], [place], [address] [phone] phone  [facility] [address] [place], [place], [address] [phone] phone  [facility]-  [address] [place], [place], [address] [phone] phone [phone] fax  [facility] [place] [address] [place], [place], [address] [phone] phone  [facility] [address] [place], [place], [address] [phone] phone [phone] fax  [facility] [address] [place], [place], [address] [phone] phone  Jovita Kussmaul [address] [place], [place], [address] [phone] phone  [facility] [address] [place], [place], [address] [phone] phone [phone] fax

## 2023-08-07 NOTE — ED Provider Notes (Signed)
Behavioral Health Urgent Care Medical Screening Exam  Patient Name: Kristen [name] MRN: [id number] Date of Evaluation: 08/07/23 Chief Complaint:  Increased anxiety Diagnosis:  Final diagnoses:  Anxiety    History of Present illness: Kristen [name] is a 89 y.o. female patient presented to [facility] as a walk in accompanied by her daughter with complaints of increased anxiety.  [name], 89 y.o., female patient seen face to face by this provider, chart reviewed, and consulted with Dr. Clovis Riley on 08/07/23.  Per chart review patient has a past psychiatric history for depression and a medical history that includes anemia, arthritis, trigeminal neuralgia, seizure disorder.  Patient is prescribed Cymbalta 60 mg daily trazodone 100 mg nightly but has been told by her PCP that she may increase medication to 200 mg nightly.  Patient is also prescribed Dilaudid 2 mg that she takes every morning and every evening for trigeminal neuralgia.  Patient has no outpatient psychiatric resources in place.  Patients daughter is present during the assessment with patient's permission.  Patient answers most questions independently.  However she does look towards her daughter to help answer questions at times.  Patient reports over the past 1-2 months she has felt nauseous and had abdominal pain.  She presented to [facility] ED on 08/05/2023 and was evaluated for abdominal pain and nausea. Patient is aware that she does have a area on her lung that is suspicious for cancer  CT of lung and of abdomen/pelvis was completed.  Patient was diagnosed with gastritis.  She was instructed to take Carafate 1 g and Reglan 10 mg 4 times daily.  She was instructed to follow-up with her PCP next week.  After discharge from hospital patient returned to her independent living apartment.    Today patient began to feel extremely panicked and called her daughter and told her that she thought she was going to die.  Patient reports since she has been sick  over the past 1-2 months she is also experienced an increase in her anxiety.  States her anxiety will start and will sometimes lead to a full panic.  She does not endorse any stressor/triggers.  However when discussing with patient appears patient only has panic attacks when she is alone.  When she begins to have a panic attack she immediately called her daughter and tells her that she must drive to her immediately.  Patient's daughter is a Engineer, civil (consulting) and works full-time and finds it difficult to run back and forth when patient is having a panic attack.  Daughter believes that patient is depressed.  However patient denies any symptoms of depression.  She appears to be unhappy with her current living situation.  Patient states she lived in [place] in an independent living for 7 years and felt that she was stable at this facility she has been living in the independent living facility [facility]" in [place] for roughly 2 years.  Daughter states patient did fine initially but she can see that patient is beginning to complain a lot about this facility and states that they do not do anything for her there.  She denies SI/HI/AVH.  She does not appear to be responding to internal/external stimuli.  Discussed outpatient psychiatric resources for medication management and therapy.  Patient's daughter has already reached out to Dr. Driscilla Grammes at [facility] and was told that appointments are booking next year.  Patient's daughter is disappointed to learn that no medications will be made today.  Explained that due to patient taking Dilaudid multiple  times a day and patient already being a fall risk that it would beneficial for patient to continue to seek medication management and therapy service in outpatient setting.  Provided multiple resources patient and her daughter verbalized understanding.  Time was given to answer any questions they may have.   At this time [name] is educated and verbalizes understanding of  mental health resources and other crisis services in the community. She is instructed to call 911 and present to the nearest emergency room should she experience any suicidal/homicidal ideation, auditory/visual/hallucinations, or detrimental worsening of her mental health condition.  She was a also advised by Clinical research associate that she could call the toll-free phone on back of  insurance card to assist with identifying counselors and agencies in network.     Flowsheet Row ED from 08/07/2023 in [facility] ED from 08/05/2023 in [facility] Department at [facility] ED from 10/13/2022 in [facility] Emergency Department at [facility]  C-SSRS RISK CATEGORY No Risk No Risk No Risk       Psychiatric Specialty Exam  Presentation  General Appearance:Well Groomed; Appropriate for Environment  Eye Contact:Good  Speech:Clear and Coherent; Normal Rate  Speech Volume:Normal  Handedness:Right   Mood and Affect  Mood:Anxious  Affect:Congruent   Thought Process  Thought Processes:Coherent  Descriptions of Associations:Intact  Orientation:Full (Time, Place and Person)  Thought Content:Logical    Hallucinations:None  Ideas of Reference:None  Suicidal Thoughts:No  Homicidal Thoughts:No   Sensorium  Memory:Immediate Good; Recent Good; Remote Good  Judgment:Good  Insight:Fair   Executive Functions  Concentration:Good  Attention Span:Good  Recall:Good  Fund of Knowledge:Good  Language:Good   Psychomotor Activity  Psychomotor Activity:Normal   Assets  Assets:Social Support; Housing; Leisure Time; Physical Health; Resilience; Communication Skills; Desire for Improvement   Sleep  Sleep:Good  Number of hours: No data recorded  Physical Exam: Physical Exam Vitals and nursing note reviewed.  Constitutional:      General: She is not in acute distress.    Appearance: Normal appearance. She is not ill-appearing.   HENT:     Head: Normocephalic.  Eyes:     General:        Right eye: No discharge.        Left eye: No discharge.  Cardiovascular:     Rate and Rhythm: Normal rate.  Pulmonary:     Effort: Pulmonary effort is normal. No respiratory distress.  Musculoskeletal:        General: Normal range of motion.     Cervical back: Normal range of motion.  Skin:    Coloration: Skin is not jaundiced or pale.  Neurological:     Mental Status: She is alert and oriented to person, place, and time.  Psychiatric:        Attention and Perception: Attention and perception normal.        Mood and Affect: Affect normal. Mood is anxious.        Speech: Speech normal.        Behavior: Behavior is cooperative.        Thought Content: Thought content normal.        Cognition and Memory: Cognition normal.        Judgment: Judgment normal.    Review of Systems  Constitutional: Negative.   HENT: Negative.    Eyes: Negative.   Respiratory: Negative.    Cardiovascular: Negative.   Gastrointestinal:  Positive for nausea.  Musculoskeletal: Negative.   Skin: Negative.  Neurological: Negative.   Psychiatric/Behavioral:  The patient is nervous/anxious.    Blood pressure (!) 129/117, pulse 99, temperature 98.8 F (37.1 C), temperature source Oral, resp. rate 20, SpO2 100%. There is no height or weight on file to calculate BMI.  Musculoskeletal: Strength & Muscle Tone: within normal limits Gait & Station: unsteady Patient leans: N/A   [facility] MSE Discharge Disposition for Follow up and Recommendations: Based on my evaluation the patient does not appear to have an emergency medical condition and can be discharged with resources and follow up care in outpatient services for Medication Management and Individual Therapy  Discharge patient provided outpatient psychiatric resources for medication management and therapy   Ardis Hughs, NP 08/07/2023, 1502PM

## 2023-08-07 NOTE — Progress Notes (Signed)
   08/07/23 1555  [facility] Triage Screening (Walk-ins at [facility] only)  How Did You Hear About [place]? Family/Friend  What Is the Reason for Your Visit/Call Today? Avi Archuleta is an 89 year old woman presenting to [facility] accompanied by her daughter. Pt mentions she is here today because her daughter thinks she is "crazy". Pt reports that she has panic attacks on occasions and is unsure on what is causing these attacks. Pt also mentions that she does not feel depressed in any way. Pt feels like when she is alone, she tends to have these panic attacks. Pt is taking medications as prescribed. Pt denies substance use, SI, HI and AVH.  How Long Has This Been Causing You Problems? <Week  Have You Recently Had Any Thoughts About Hurting Yourself? No  Are You Planning to Commit Suicide/Harm Yourself At This time? No  Have you Recently Had Thoughts About Hurting Someone Karolee Ohs? No  Are You Planning To Harm Someone At This Time? No  Are you currently experiencing any auditory, visual or other hallucinations? No  Have You Used Any Alcohol or Drugs in the Past 24 Hours? No  Do you have any current medical co-morbidities that require immediate attention? No  Clinician description of patient physical appearance/behavior: Cooperative, calm  What Do You Feel Would Help You the Most Today? Stress Management  If access to [facility] Urgent Care was not available, would you have sought care in the Emergency Department? No  Determination of Need Routine (7 days)  Options For Referral [facility] Facility

## 2023-08-09 ENCOUNTER — Other Ambulatory Visit: Payer: Self-pay

## 2023-08-09 ENCOUNTER — Encounter (HOSPITAL_COMMUNITY): Payer: Self-pay | Admitting: Emergency Medicine

## 2023-08-09 ENCOUNTER — Emergency Department (HOSPITAL_COMMUNITY)
Admission: EM | Admit: 2023-08-09 | Discharge: 2023-08-10 | Disposition: A | Discharge status: Home / Self Care | Payer: Medicare Other | Attending: Emergency Medicine | Admitting: Emergency Medicine

## 2023-08-09 DIAGNOSIS — E871 Hypo-osmolality and hyponatremia: Secondary | ICD-10-CM | POA: Diagnosis not present

## 2023-08-09 DIAGNOSIS — K2970 Gastritis, unspecified, without bleeding: Secondary | ICD-10-CM

## 2023-08-09 DIAGNOSIS — R1084 Generalized abdominal pain: Secondary | ICD-10-CM | POA: Diagnosis not present

## 2023-08-09 DIAGNOSIS — R1013 Epigastric pain: Secondary | ICD-10-CM | POA: Diagnosis not present

## 2023-08-09 DIAGNOSIS — Z7901 Long term (current) use of anticoagulants: Secondary | ICD-10-CM | POA: Diagnosis not present

## 2023-08-09 DIAGNOSIS — I10 Essential (primary) hypertension: Secondary | ICD-10-CM | POA: Insufficient documentation

## 2023-08-09 DIAGNOSIS — R110 Nausea: Secondary | ICD-10-CM | POA: Diagnosis not present

## 2023-08-09 LAB — CBC WITH DIFFERENTIAL/PLATELET
Abs Immature Granulocytes: 0.02 10*3/uL (ref 0.00–0.07)
Basophils Absolute: 0.1 10*3/uL (ref 0.0–0.1)
Basophils Relative: 1 %
Eosinophils Absolute: 0.2 10*3/uL (ref 0.0–0.5)
Eosinophils Relative: 2 %
HCT: 33 % — ABNORMAL LOW (ref 36.0–46.0)
Hemoglobin: 11.5 g/dL — ABNORMAL LOW (ref 12.0–15.0)
Immature Granulocytes: 0 %
Lymphocytes Relative: 26 %
Lymphs Abs: 2 10*3/uL (ref 0.7–4.0)
MCH: 32.9 pg (ref 26.0–34.0)
MCHC: 34.8 g/dL (ref 30.0–36.0)
MCV: 94.3 fL (ref 80.0–100.0)
Monocytes Absolute: 0.8 10*3/uL (ref 0.1–1.0)
Monocytes Relative: 10 %
Neutro Abs: 4.9 10*3/uL (ref 1.7–7.7)
Neutrophils Relative %: 61 %
Platelets: 233 10*3/uL (ref 150–400)
RBC: 3.5 MIL/uL — ABNORMAL LOW (ref 3.87–5.11)
RDW: 11.9 % (ref 11.5–15.5)
WBC: 8 10*3/uL (ref 4.0–10.5)
nRBC: 0 % (ref 0.0–0.2)

## 2023-08-09 LAB — COMPREHENSIVE METABOLIC PANEL
ALT: 18 U/L (ref 0–44)
AST: 25 U/L (ref 15–41)
Albumin: 4 g/dL (ref 3.5–5.0)
Alkaline Phosphatase: 60 U/L (ref 38–126)
Anion gap: 9 (ref 5–15)
BUN: 18 mg/dL (ref 8–23)
CO2: 26 mmol/L (ref 22–32)
Calcium: 8.7 mg/dL — ABNORMAL LOW (ref 8.9–10.3)
Chloride: 93 mmol/L — ABNORMAL LOW (ref 98–111)
Creatinine, Ser: 1.01 mg/dL — ABNORMAL HIGH (ref 0.44–1.00)
GFR, Estimated: 54 mL/min — ABNORMAL LOW (ref >60)
Glucose, Bld: 99 mg/dL (ref 70–99)
Potassium: 3.9 mmol/L (ref 3.5–5.1)
Sodium: 128 mmol/L — ABNORMAL LOW (ref 135–145)
Total Bilirubin: 0.6 mg/dL (ref 0.3–1.2)
Total Protein: 6.9 g/dL (ref 6.5–8.1)

## 2023-08-09 LAB — LIPASE, BLOOD: Lipase: 45 U/L (ref 11–51)

## 2023-08-09 LAB — TROPONIN I (HIGH SENSITIVITY): Troponin I (High Sensitivity): 13 ng/L (ref <18)

## 2023-08-09 MED ORDER — ONDANSETRON 4 MG PO TBDP: ORAL_TABLET | ORAL | 0 refills | Status: DC

## 2023-08-09 MED ORDER — FAMOTIDINE IN NACL 20-0.9 MG/50ML-% IV SOLN
20.0000 mg | Freq: Once | INTRAVENOUS | Status: AC
  Administered 2023-08-09: 20 mg via INTRAVENOUS
  Filled 2023-08-09: qty 50

## 2023-08-09 MED ORDER — ALUM & MAG HYDROXIDE-SIMETH 200-200-20 MG/5ML PO SUSP
30.0000 mL | Freq: Once | ORAL | Status: AC
  Administered 2023-08-09: 30 mL via ORAL
  Filled 2023-08-09: qty 30

## 2023-08-09 MED ORDER — PANTOPRAZOLE SODIUM 40 MG IV SOLR
40.0000 mg | Freq: Once | INTRAVENOUS | Status: AC
  Administered 2023-08-09: 40 mg via INTRAVENOUS
  Filled 2023-08-09: qty 10

## 2023-08-09 NOTE — ED Provider Notes (Signed)
[place] EMERGENCY DEPARTMENT AT [facility] Provider Note   CSN: [id number] Arrival date & time: 08/09/23  1544     History  Chief Complaint  Patient presents with   Abdominal Pain    Kristen [name] is a 89 y.o. female history of A-fib on Pradaxa, hypertension, gastritis here presenting with abdominal pain.  Patient was seen at [facility] several days ago after having some abdominal pain.  She had a CT scan that showed gastritis and lung mass.  Patient was told that she needs to follow-up with her doctor.  She states that she ate some peas and tapioca today.  She states that shortly afterwards, she had some epigastric pain.  Denies any nausea or vomiting.  The history is provided by the patient.       Home Medications Prior to Admission medications   Medication Sig Start Date End Date Taking? Authorizing Provider  albuterol (VENTOLIN HFA) 108 (90 Base) MCG/ACT inhaler Inhale 2 puffs into the lungs every 6 (six) hours as needed for wheezing or shortness of breath. Patient not taking: Reported on 12/23/2022 05/20/22   Noemi Chapel, NP  atorvastatin (LIPITOR) 20 MG tablet Take 20 mg by mouth daily. 09/26/21   [provider]  cyanocobalamin (,VITAMIN B-12,) 1000 MCG/ML injection Inject 100 mcg into the muscle every 30 (thirty) days. 11/02/19   [provider]  dabigatran (PRADAXA) 150 MG CAPS capsule Take 1 capsule (150 mg total) by mouth every 12 (twelve) hours. 11/26/21 02/24/22  [place], Alvira Philips, DO  diclofenac Sodium (VOLTAREN) 1 % GEL Apply 2 g topically daily. For knee pain    [provider]  DULoxetine (CYMBALTA) 60 MG capsule Take 60 mg by mouth at bedtime. 12/05/21   [provider]  esomeprazole (NEXIUM) 40 MG capsule Take 40 mg by mouth daily. 09/26/21   [provider]  ferrous sulfate 325 (65 FE) MG tablet Take 1 tablet (325 mg total) by mouth daily. 11/26/21 02/24/22  [place], Alvira Philips, DO  gabapentin (NEURONTIN)  300 MG capsule Take 300 mg by mouth daily. 08/29/21   [provider]  HYDROmorphone (DILAUDID) 2 MG tablet Take 2 mg by mouth in the morning and at bedtime. 10/03/21   [provider]  ipratropium (ATROVENT) 0.06 % nasal spray Place 2 sprays into both nostrils daily. 12/30/22   Cobb, Ruby Cola, NP  losartan (COZAAR) 50 MG tablet Take 50 mg by mouth daily. 07/28/21   [provider]  meloxicam (MOBIC) 15 MG tablet Take 15 mg by mouth daily. Patient not taking: Reported on 12/15/2021    [provider]  metoCLOPramide (REGLAN) 10 MG tablet Take 1 tablet (10 mg total) by mouth every 6 (six) hours. 08/05/23   Gwyneth Sprout, MD  nebivolol (BYSTOLIC) 5 MG tablet Take 5 mg by mouth daily. Patient not taking: Reported on 12/15/2021    [provider]  primidone (MYSOLINE) 50 MG tablet Take 50 mg by mouth at bedtime. 08/26/21   [provider]  sucralfate (CARAFATE) 1 g tablet Take 1 tablet (1 g total) by mouth 4 (four) times daily -  with meals and at bedtime. 08/05/23   Gwyneth Sprout, MD  traZODone (DESYREL) 100 MG tablet Take 100 mg by mouth at bedtime. 08/21/21   [provider]      Allergies    Penicillins, Tetracyclines & related, and Sulfa antibiotics    Review of Systems   Review of Systems  Gastrointestinal:  Positive for abdominal pain.  All other systems reviewed and are negative.   Physical Exam Updated Vital Signs BP (!) 185/60 (BP Location: Right Arm)   Pulse 69   Temp 97.7 F (36.5 C) (Oral)   Resp 18   LMP  (LMP Unknown)   SpO2 99%  Physical Exam Vitals and nursing note reviewed.  Constitutional:      Comments: Slightly uncomfortable  HENT:     Head: Normocephalic.     Mouth/Throat:     Mouth: Mucous membranes are moist.  Eyes:     Extraocular Movements: Extraocular movements intact.     Pupils: Pupils are equal, round, and reactive to light.  Cardiovascular:     Rate and Rhythm: Normal rate and  regular rhythm.     Heart sounds: Normal heart sounds.  Pulmonary:     Effort: Pulmonary effort is normal.     Breath sounds: Normal breath sounds.  Abdominal:     General: Abdomen is flat.     Comments: + epigastric tenderness   Skin:    General: Skin is warm.     Capillary Refill: Capillary refill takes less than 2 seconds.  Neurological:     General: No focal deficit present.  Psychiatric:        Mood and Affect: Mood normal.        Behavior: Behavior normal.     ED Results / Procedures / Treatments   Labs (all labs ordered are listed, but only abnormal results are displayed) Labs Reviewed  CBC WITH DIFFERENTIAL/PLATELET  COMPREHENSIVE METABOLIC PANEL  LIPASE, BLOOD  TROPONIN I (HIGH SENSITIVITY)    EKG None  Radiology No results found.  Procedures Procedures    Medications Ordered in ED Medications  famotidine (PEPCID) IVPB 20 mg premix (has no administration in time range)  pantoprazole (PROTONIX) injection 40 mg (has no administration in time range)  alum & mag hydroxide-simeth (MAALOX/MYLANTA) 200-200-20 MG/5ML suspension 30 mL (has no administration in time range)    ED Course/ Medical Decision Making/ A&P                                 Medical Decision Making Kristen [name] is a 89 y.o. female here presenting with epigastric pain.  Patient was diagnosed with gastritis on CT scan recently.  Patient ate some food and had some nausea and abdominal pain.  Consider gastritis versus ACS versus stomach ulcer.  Plan to get CBC and CMP and troponin x 2 and lipase and will give Pepcid and Protonix and GI cocktail and reassess.   6:57 PM I reviewed patient's labs and sodium is 128.  Patient was given IV fluids.  Patient is feeling better after GI cocktail.  Will have her continue Carafate.  Will refer to GI outpatient for endoscopy  Problems Addressed: Gastritis without bleeding, unspecified chronicity, unspecified gastritis type: acute illness or  injury  Amount and/or Complexity of Data Reviewed Labs: ordered. Decision-making details documented in ED Course. ECG/medicine tests: ordered and independent interpretation performed. Decision-making details documented in ED Course.  Risk OTC drugs. Prescription drug management.    Final Clinical Impression(s) / ED Diagnoses Final diagnoses:  None    Rx / DC Orders ED Discharge Orders     None         Charlynne Pander, MD 08/09/23 1859

## 2023-08-09 NOTE — ED Notes (Signed)
[facility] has been notified of transport, there are several on the list waiting for transfer at this time, [facility] stated it will be several hours before pt will be picked up d/t high call volume.

## 2023-08-09 NOTE — ED Notes (Signed)
Attempted to call [facility], no answer at this time, could not leave VM, will attempt to call again shortly.  Phone: [phone]

## 2023-08-09 NOTE — Discharge Instructions (Signed)
As we discussed you likely have gastritis.  Please eat a bland diet for the next several days.  I recommend you continue Nexium and also Carafate as prescribed during the most recent ED visit  Please call Dr. Hulen Shouts office for follow-up and you may need endoscopy  I have prescribed Zofran as needed for nausea and please stay hydrated  Return to ER if you have worse abdominal pain or vomiting

## 2023-08-09 NOTE — ED Triage Notes (Addendum)
Patient lives at [facility] [place] and was brought in by EMS due to abdominal pain and esophageal burning after eating lunch. She reported having an odd sensation on her tongue after eating tapioca pudding. Recently diagnosed with gastroenteritis.     EMS vitals: 142/70 BP 72 HR 98% SPO2 on room air  153 CBG

## 2023-08-09 NOTE — ED Notes (Signed)
[name] called Tamela Oddi, pt gave verbal permission to speak with her daughter, update given to daughter, questions and concerns were answer and address, advised pt waiting on [facility] to transfer her back to [place],. Daughter grateful for update.

## 2023-08-10 DIAGNOSIS — R1013 Epigastric pain: Secondary | ICD-10-CM | POA: Diagnosis not present

## 2023-08-10 MED ORDER — LOSARTAN POTASSIUM 25 MG PO TABS
50.0000 mg | ORAL_TABLET | Freq: Once | ORAL | Status: AC
  Administered 2023-08-10: 50 mg via ORAL
  Filled 2023-08-10: qty 2

## 2023-08-10 NOTE — ED Notes (Signed)
MD Bebe Shaggy stated ok to discharge with BP elevated, medication given, refer to [facility]

## 2023-08-11 ENCOUNTER — Ambulatory Visit: Payer: Self-pay | Admitting: Licensed Clinical Social Worker

## 2023-08-12 NOTE — Patient Instructions (Signed)
Visit Information  Thank you for taking time to visit with me today. Please don't hesitate to contact me if I can be of assistance to you.   Following are the goals we discussed today:   Goals Addressed             This Visit's Progress    Obtain Supportive Resources-Caregiver Strain   On track    Activities and task to complete in order to accomplish goals.   Keep all upcoming appointments discussed today Continue with compliance of taking medication prescribed by Doctor Implement healthy coping skills discussed to assist with management of symptoms         Our next appointment is by telephone on 10/25   Please call the care guide team at [phone] if you need to cancel or reschedule your appointment.   If you are experiencing a Mental Health or Behavioral Health Crisis or need someone to talk to, please call the Suicide and Crisis Lifeline: 988 call 911   Patient verbalizes understanding of instructions and care plan provided today and agrees to view in MyChart. Active MyChart status and patient understanding of how to access instructions and care plan via MyChart confirmed with patient.     Kristen [name], MSW, LCSW [facility] [place]  [facility] [email or web address] Phone [phone] 2:00 PM

## 2023-08-12 NOTE — Patient Outreach (Signed)
Care Coordination   Follow Up Visit Note   08/11/2023 Name: Kristen [name] MRN: [id number] DOB: [birth year]  Kristen [name] is a 89 y.o. year old female who sees Emilio Aspen, MD for primary care. I spoke with  Briante Muldrew daughter by phone today.  What matters to the patients health and wellness today?  Caregiver Stress, Symptom Management    Goals Addressed             This Visit's Progress    Obtain Supportive Resources-Caregiver Strain   On track    Activities and task to complete in order to accomplish goals.   Keep all upcoming appointments discussed today Continue with compliance of taking medication prescribed by Doctor Implement healthy coping skills discussed to assist with management of symptoms         SDOH assessments and interventions completed:  No     Care Coordination Interventions:  Yes, provided  Interventions Today    Flowsheet Row Most Recent Value  Chronic Disease   Chronic disease during today's visit Hypertension (HTN), Chronic Kidney Disease/End Stage Renal Disease (ESRD), Other  [Chronic Pain Disorder]  General Interventions   General Interventions Discussed/Reviewed General Interventions Reviewed, Doctor Visits  [[name] provided update on recent ED visits and provider recommendations reviewed]  Doctor Visits Discussed/Reviewed Doctor Visits Reviewed  Mental Health Interventions   Mental Health Discussed/Reviewed Mental Health Reviewed, Coping Strategies  [Self-care and boundary setting discussed to assist with management of caregiver stress. Pt has an upcoming appt with geriatric psychiatrist on 10/30]  Nutrition Interventions   Nutrition Discussed/Reviewed Nutrition Reviewed  Pharmacy Interventions   Pharmacy Dicussed/Reviewed Pharmacy Topics Reviewed, Medication Adherence  Safety Interventions   Safety Discussed/Reviewed Safety Reviewed       Follow up plan: Follow up call scheduled for 1-3 weeks    Encounter Outcome:   Patient Visit Completed   Jenel Lucks, MSW, LCSW [facility] Care Management [facility] Health  [facility] [email or web address] Phone [phone] 1:59 PM

## 2023-08-20 ENCOUNTER — Ambulatory Visit: Payer: Self-pay | Admitting: Licensed Clinical Social Worker

## 2023-08-24 NOTE — Patient Instructions (Signed)
Visit Information  Thank you for taking time to visit with me today. Please don't hesitate to contact me if I can be of assistance to you.   Following are the goals we discussed today:   Goals Addressed             This Visit's Progress    Obtain Supportive Resources-Caregiver Strain   On track    Activities and task to complete in order to accomplish goals.   Keep all upcoming appointments discussed today Continue with compliance of taking medication prescribed by Doctor Implement healthy coping skills discussed to assist with management of symptoms         Our next appointment is by telephone on 11/1 at 11:30 AM  Please call the care guide team at [phone] if you need to cancel or reschedule your appointment.   If you are experiencing a Mental Health or Behavioral Health Crisis or need someone to talk to, please call the Suicide and Crisis Lifeline: 988 call 911   Patient verbalizes understanding of instructions and care plan provided today and agrees to view in MyChart. Active MyChart status and patient understanding of how to access instructions and care plan via MyChart confirmed with patient.     Jenel Lucks, MSW, LCSW [facility] Care Management [place]  [facility] [email or web address] Phone [phone] 3:58 PM

## 2023-08-24 NOTE — Patient Outreach (Signed)
Care Coordination   Follow Up Visit Note   08/20/2023 Name: Kristen [name] MRN: [id number] DOB: 1933/06/05  Kristen [name] is a 89 y.o. year old female who sees Emilio Aspen, MD for primary care. I spoke with  Kristen [name] daughter, Kristen [name], by phone today.  What matters to the patients health and wellness today?  Symptom Management and Caregiver Strain    Goals Addressed             This Visit's Progress    Obtain Supportive Resources-Caregiver Strain   On track    Activities and task to complete in order to accomplish goals.   Keep all upcoming appointments discussed today Continue with compliance of taking medication prescribed by Doctor Implement healthy coping skills discussed to assist with management of symptoms         SDOH assessments and interventions completed:  No     Care Coordination Interventions:  Yes, provided  Interventions Today    Flowsheet Row Most Recent Value  Chronic Disease   Chronic disease during today's visit Hypertension (HTN), Chronic Kidney Disease/End Stage Renal Disease (ESRD), Other  [Chronic Pain Disorder]  General Interventions   General Interventions Discussed/Reviewed General Interventions Reviewed, Doctor Visits  Doctor Visits Discussed/Reviewed Doctor Visits Reviewed  Mental Health Interventions   Mental Health Discussed/Reviewed Mental Health Reviewed, Coping Strategies, Anxiety  [Pt's upcoming appt with geriatric psychiatrist scheduled for 08/25/23. LCSW continued to encourage self-care and boundary setting with caregiver.]  Nutrition Interventions   Nutrition Discussed/Reviewed Nutrition Reviewed  Pharmacy Interventions   Pharmacy Dicussed/Reviewed Pharmacy Topics Reviewed, Medication Adherence  Safety Interventions   Safety Discussed/Reviewed Safety Reviewed       Follow up plan: Follow up call scheduled for 2-4 weeks    Encounter Outcome:  Patient Visit Completed   Jenel Lucks, MSW, LCSW [facility] Care  Management [facility] Health  [facility] [email or web address] Phone [phone] 3:58 PM

## 2023-08-25 DIAGNOSIS — F4321 Adjustment disorder with depressed mood: Secondary | ICD-10-CM | POA: Diagnosis not present

## 2023-08-27 ENCOUNTER — Ambulatory Visit: Payer: Self-pay | Admitting: Licensed Clinical Social Worker

## 2023-08-30 NOTE — Patient Instructions (Signed)
Visit Information  Thank you for taking time to visit with me today. Please don't hesitate to contact me if I can be of assistance to you.   Following are the goals we discussed today:   Goals Addressed             This Visit's Progress    Obtain Supportive Resources-Caregiver Strain   On track    Activities and task to complete in order to accomplish goals.   Keep all upcoming appointments discussed today Continue with compliance of taking medication prescribed by Doctor Implement healthy coping skills discussed to assist with management of symptoms         Our next appointment is by telephone on 12/2 at 11:30 AM  Please call the care guide team at [phone] if you need to cancel or reschedule your appointment.   If you are experiencing a Mental Health or Behavioral Health Crisis or need someone to talk to, please call the Suicide and Crisis Lifeline: 988 call 911   Patient verbalizes understanding of instructions and care plan provided today and agrees to view in MyChart. Active MyChart status and patient understanding of how to access instructions and care plan via MyChart confirmed with patient.     Jenel Lucks, MSW, LCSW [facility] Care Management [place]  [facility] [email or web address] Phone [phone] 5:46 PM

## 2023-08-30 NOTE — Patient Outreach (Signed)
  Care Coordination   Follow Up Visit Note   08/27/2023 Name: Kristen [name] MRN: [id number] DOB: 1933/09/27  Kristen [name] is a 89 y.o. year old female who sees Emilio Aspen, MD for primary care. I spoke with  Marisella Puccio daughter by phone today.  What matters to the patients health and wellness today?  Symptom Management    Goals Addressed             This Visit's Progress    Obtain Supportive Resources-Caregiver Strain   On track    Activities and task to complete in order to accomplish goals.   Keep all upcoming appointments discussed today Continue with compliance of taking medication prescribed by Doctor Implement healthy coping skills discussed to assist with management of symptoms         SDOH assessments and interventions completed:  No     Care Coordination Interventions:  Yes, provided  Interventions Today    Flowsheet Row Most Recent Value  Chronic Disease   Chronic disease during today's visit Hypertension (HTN), Chronic Kidney Disease/End Stage Renal Disease (ESRD), Other  [Chronic Pain Disorder]  General Interventions   General Interventions Discussed/Reviewed General Interventions Reviewed, Doctor Visits  [Pt not interested in gastroenterologist, states she is feeling fine and denies stomach pain following listing of preferred foods.]  Doctor Visits Discussed/Reviewed Doctor Visits Reviewed  Mental Health Interventions   Mental Health Discussed/Reviewed Mental Health Reviewed, Coping Strategies, Anxiety, Depression  [Daughter accompanied pt to geriatric psychiatrist, who adjusted meds. He has a Veterinary surgeon that may be a good fit,  however, pt is not intersted at this time. She will f/up in Jan 2025]  Nutrition Interventions   Nutrition Discussed/Reviewed Nutrition Reviewed  [Pt has lost weight. Pt's dental bridge is gone, unsure if she swallowed it or misplaced bridge. Pt doesn't want to replace,  however, has difficulty chewing. Dental cleaning  scheduled in November, and they will address with dentist]  Pharmacy Interventions   Pharmacy Dicussed/Reviewed Pharmacy Topics Reviewed, Medication Adherence  Safety Interventions   Safety Discussed/Reviewed Safety Reviewed       Follow up plan: Follow up call scheduled for 3-6 weeks    Encounter Outcome:  Patient Visit Completed   Jenel Lucks, MSW, LCSW [facility] Care Management [facility] Health  [facility] [email or web address] Phone [phone] 5:46 PM

## 2023-09-21 DIAGNOSIS — K2970 Gastritis, unspecified, without bleeding: Secondary | ICD-10-CM | POA: Diagnosis not present

## 2023-09-21 DIAGNOSIS — R110 Nausea: Secondary | ICD-10-CM | POA: Diagnosis not present

## 2023-09-21 DIAGNOSIS — D649 Anemia, unspecified: Secondary | ICD-10-CM | POA: Diagnosis not present

## 2023-09-21 DIAGNOSIS — G509 Disorder of trigeminal nerve, unspecified: Secondary | ICD-10-CM | POA: Diagnosis not present

## 2023-09-21 DIAGNOSIS — F5101 Primary insomnia: Secondary | ICD-10-CM | POA: Diagnosis not present

## 2023-09-21 DIAGNOSIS — Z86711 Personal history of pulmonary embolism: Secondary | ICD-10-CM | POA: Diagnosis not present

## 2023-09-21 DIAGNOSIS — R918 Other nonspecific abnormal finding of lung field: Secondary | ICD-10-CM | POA: Diagnosis not present

## 2023-09-21 DIAGNOSIS — F419 Anxiety disorder, unspecified: Secondary | ICD-10-CM | POA: Diagnosis not present

## 2023-09-21 DIAGNOSIS — E871 Hypo-osmolality and hyponatremia: Secondary | ICD-10-CM | POA: Diagnosis not present

## 2023-09-21 DIAGNOSIS — R630 Anorexia: Secondary | ICD-10-CM | POA: Diagnosis not present

## 2023-09-21 DIAGNOSIS — I10 Essential (primary) hypertension: Secondary | ICD-10-CM | POA: Diagnosis not present

## 2023-09-27 ENCOUNTER — Ambulatory Visit: Payer: Self-pay | Admitting: Licensed Clinical Social Worker

## 2023-09-27 NOTE — Patient Instructions (Signed)
Visit Information  Thank you for taking time to visit with me today. Please don't hesitate to contact me if I can be of assistance to you.   Following are the goals we discussed today:   Goals Addressed             This Visit's Progress    Obtain Supportive Resources-Caregiver Strain   On track    Activities and task to complete in order to accomplish goals.   Keep all upcoming appointments discussed today Continue with compliance of taking medication prescribed by Doctor Implement healthy coping skills discussed to assist with management of symptoms Call [facility] to follow up on medicare plans and benefits, if needed Complete new pt ppwk for oral surgeon to go on wait list. Initial appt 12/23/23         Our next appointment is by telephone on 1/13 at 10:30 AM  Please call the care guide team at [phone] if you need to cancel or reschedule your appointment.   If you are experiencing a Mental Health or Behavioral Health Crisis or need someone to talk to, please call the Suicide and Crisis Lifeline: 988 call 911   Patient verbalizes understanding of instructions and care plan provided today and agrees to view in MyChart. Active MyChart status and patient understanding of how to access instructions and care plan via MyChart confirmed with patient.     Jenel Lucks, MSW, LCSW [facility] Care Management [place]  [facility] [email or web address] Phone [phone] 2:32 PM

## 2023-09-27 NOTE — Patient Outreach (Signed)
  Care Coordination   Follow Up Visit Note   09/27/2023 Name: Kristen [name] MRN: [id number] DOB: 05-04-1933  Kristen [name] is a 89 y.o. year old female who sees Emilio Aspen, MD for primary care. I spoke with  Inda Merlin daughter, Darl Pikes, by phone today.  What matters to the patients health and wellness today?  Caregiver Stress, Symptom Management    Goals Addressed             This Visit's Progress    Obtain Supportive Resources-Caregiver Strain   On track    Activities and task to complete in order to accomplish goals.   Keep all upcoming appointments discussed today Continue with compliance of taking medication prescribed by Doctor Implement healthy coping skills discussed to assist with management of symptoms Call [facility] to follow up on medicare plans and benefits, if needed Complete new pt ppwk for oral surgeon to go on wait list. Initial appt 12/23/23         SDOH assessments and interventions completed:  No     Care Coordination Interventions:  Yes, provided  Interventions Today    Flowsheet Row Most Recent Value  Chronic Disease   Chronic disease during today's visit Hypertension (HTN), Chronic Kidney Disease/End Stage Renal Disease (ESRD)  General Interventions   General Interventions Discussed/Reviewed General Interventions Reviewed, Doctor Visits  Doctor Visits Discussed/Reviewed Doctor Visits Reviewed  Exercise Interventions   Exercise Discussed/Reviewed Exercise Reviewed  [Patient is walking at residence]  Mental Health Interventions   Mental Health Discussed/Reviewed Mental Health Reviewed, Coping Strategies  [Strategies to assist with caregiver stress discussed]  Nutrition Interventions   Nutrition Discussed/Reviewed Nutrition Reviewed, Decreasing sugar intake  [[name] continues to encourage pt to decrease sugar intake to promote health and wellness]  Pharmacy Interventions   Pharmacy Dicussed/Reviewed Pharmacy Topics Reviewed,  Medication Adherence  [[name] is collaborating with Pharmacy to update pt's pill packs, to include psych medications]  Safety Interventions   Safety Discussed/Reviewed Safety Reviewed  Advanced Directive Interventions   Advanced Directives Discussed/Reviewed End of Life  End of Life Hospice  [Discussed eligibility criteria for hospice vs palliative care]       Follow up plan: Follow up call scheduled for 6-8 weeks    Encounter Outcome:  Patient Visit Completed   Jenel Lucks, MSW, LCSW [facility] Care Management [facility] Health  [facility] [email or web address] Phone [phone] 2:32 PM

## 2023-10-22 DIAGNOSIS — R61 Generalized hyperhidrosis: Secondary | ICD-10-CM | POA: Diagnosis not present

## 2023-10-22 DIAGNOSIS — F5101 Primary insomnia: Secondary | ICD-10-CM | POA: Diagnosis not present

## 2023-10-22 DIAGNOSIS — R630 Anorexia: Secondary | ICD-10-CM | POA: Diagnosis not present

## 2023-10-22 DIAGNOSIS — Z86711 Personal history of pulmonary embolism: Secondary | ICD-10-CM | POA: Diagnosis not present

## 2023-10-22 DIAGNOSIS — I10 Essential (primary) hypertension: Secondary | ICD-10-CM | POA: Diagnosis not present

## 2023-10-22 DIAGNOSIS — G509 Disorder of trigeminal nerve, unspecified: Secondary | ICD-10-CM | POA: Diagnosis not present

## 2023-10-22 DIAGNOSIS — R531 Weakness: Secondary | ICD-10-CM | POA: Diagnosis not present

## 2023-10-22 DIAGNOSIS — F419 Anxiety disorder, unspecified: Secondary | ICD-10-CM | POA: Diagnosis not present

## 2023-10-22 DIAGNOSIS — K2970 Gastritis, unspecified, without bleeding: Secondary | ICD-10-CM | POA: Diagnosis not present

## 2023-10-22 DIAGNOSIS — R110 Nausea: Secondary | ICD-10-CM | POA: Diagnosis not present

## 2023-10-22 DIAGNOSIS — R918 Other nonspecific abnormal finding of lung field: Secondary | ICD-10-CM | POA: Diagnosis not present

## 2023-10-22 DIAGNOSIS — D649 Anemia, unspecified: Secondary | ICD-10-CM | POA: Diagnosis not present

## 2023-10-26 ENCOUNTER — Telehealth: Payer: Self-pay | Admitting: Licensed Clinical Social Worker

## 2023-10-26 NOTE — Patient Outreach (Signed)
  Care Coordination   Follow Up Visit Note   10/26/2023 Name: Kristen [name] MRN: [id number] DOB: Feb 21, 1933  Kristen [name] is a 89 y.o. year old female who sees Charlott Dorn LABOR, MD for primary care. I spoke with  Ron[facility]d Centers by phone today.  What matters to the patients health and wellness today?  Level of Care, Symptom Management    Goals Addressed             This Visit's Progress    Obtain Supportive Resources-Caregiver Strain   On track    Activities and task to complete in order to accomplish goals.   Keep all upcoming appointments discussed today Continue with compliance of taking medication prescribed by Doctor Implement healthy coping skills discussed to assist with management of symptoms. F/up with psychiatrist regarding ongoing depression/anxiety symptoms Call [facility] to follow up on medicare plans and benefits, if needed Complete new pt ppwk for oral surgeon to go on wait list. Initial appt 12/23/23         SDOH assessments and interventions completed:  No     Care Coordination Interventions:  Yes, provided  Interventions Today    Flowsheet Row Most Recent Value  Chronic Disease   Chronic disease during today's visit Chronic Kidney Disease/End Stage Renal Disease (ESRD), Hypertension (HTN), Other  [Chronic Pain Disorder]  General Interventions   General Interventions Discussed/Reviewed General Interventions Reviewed, Level of Care, Doctor Visits  Doctor Visits Discussed/Reviewed Doctor Visits Reviewed  Level of Care Personal Care Services  Mental Health Interventions   Mental Health Discussed/Reviewed Mental Health Reviewed, Coping Strategies  [Caregiver Stress, Depression]  Nutrition Interventions   Nutrition Discussed/Reviewed Nutrition Reviewed, Decreasing sugar intake, Supplemental nutrition, Adding fruits and vegetables, Increasing proteins  Pharmacy Interventions   Pharmacy Dicussed/Reviewed Pharmacy Topics Reviewed, Medication Adherence  Safety  Interventions   Safety Discussed/Reviewed Safety Reviewed  Advanced Directive Interventions   Advanced Directives Discussed/Reviewed End of Life  End of Life Palliative, Hospice       Follow up plan: Follow up call scheduled for 2-4 weeks    Encounter Outcome:  Patient Visit Completed   Rolin Kerns, MSW, LCSW [facility] Care Management [facility] Health  [facility] [email or web address] Phone [phone] 4:26 PM

## 2023-10-26 NOTE — Patient Outreach (Signed)
  Care Coordination   10/26/2023 Name: Kristen [name] MRN: [id number] DOB: 08/25/1933   Care Coordination Outreach Attempts:  An unsuccessful telephone outreach was attempted today to offer the patient information about available complex care management services.LCSW returning pt's daughter calls from 10/25/23  Follow Up Plan:  Additional outreach attempts will be made to offer the patient complex care management information and services.   Encounter Outcome:  No Answer   Care Coordination Interventions:  No, not indicated    Rolin Kerns, MSW, LCSW [facility] Care Management [place]  [facility] [email or web address] Phone [phone] 9:52 AM

## 2023-10-26 NOTE — Patient Instructions (Signed)
 Visit Information  Thank you for taking time to visit with me today. Please don't hesitate to contact me if I can be of assistance to you.   Following are the goals we discussed today:   Goals Addressed             This Visit's Progress    Obtain Supportive Resources-Caregiver Strain   On track    Activities and task to complete in order to accomplish goals.   Keep all upcoming appointments discussed today Continue with compliance of taking medication prescribed by Doctor Implement healthy coping skills discussed to assist with management of symptoms. F/up with psychiatrist regarding ongoing depression/anxiety symptoms Call [facility] to follow up on medicare plans and benefits, if needed Complete new pt ppwk for oral surgeon to go on wait list. Initial appt 12/23/23         Our next appointment is by telephone on 1/13 at 10:30  Please call the care guide team at [phone] if you need to cancel or reschedule your appointment.   If you are experiencing a Mental Health or Behavioral Health Crisis or need someone to talk to, please call the Suicide and Crisis Lifeline: 988 call 911   Patient verbalizes understanding of instructions and care plan provided today and agrees to view in MyChart. Active MyChart status and patient understanding of how to access instructions and care plan via MyChart confirmed with patient.     Ady Heimann, MSW, LCSW [facility] Care Management [place]  [facility] [email or web address] Phone [phone] 4:27 PM

## 2023-10-28 ENCOUNTER — Emergency Department (HOSPITAL_COMMUNITY): Payer: Medicare Other

## 2023-10-28 ENCOUNTER — Encounter (HOSPITAL_COMMUNITY): Payer: Self-pay

## 2023-10-28 ENCOUNTER — Emergency Department (HOSPITAL_COMMUNITY)
Admission: EM | Admit: 2023-10-28 | Discharge: 2023-10-28 | Disposition: A | Discharge status: Home / Self Care | Payer: Medicare PPO | Source: Home / Self Care | Attending: Emergency Medicine | Admitting: Emergency Medicine

## 2023-10-28 DIAGNOSIS — N2889 Other specified disorders of kidney and ureter: Secondary | ICD-10-CM | POA: Insufficient documentation

## 2023-10-28 DIAGNOSIS — R634 Abnormal weight loss: Secondary | ICD-10-CM | POA: Insufficient documentation

## 2023-10-28 DIAGNOSIS — I10 Essential (primary) hypertension: Secondary | ICD-10-CM | POA: Insufficient documentation

## 2023-10-28 DIAGNOSIS — S72001A Fracture of unspecified part of neck of right femur, initial encounter for closed fracture: Secondary | ICD-10-CM | POA: Diagnosis not present

## 2023-10-28 DIAGNOSIS — S72011A Unspecified intracapsular fracture of right femur, initial encounter for closed fracture: Secondary | ICD-10-CM | POA: Diagnosis not present

## 2023-10-28 DIAGNOSIS — R630 Anorexia: Secondary | ICD-10-CM | POA: Insufficient documentation

## 2023-10-28 DIAGNOSIS — Z79899 Other long term (current) drug therapy: Secondary | ICD-10-CM | POA: Insufficient documentation

## 2023-10-28 DIAGNOSIS — R918 Other nonspecific abnormal finding of lung field: Secondary | ICD-10-CM | POA: Insufficient documentation

## 2023-10-28 DIAGNOSIS — R110 Nausea: Secondary | ICD-10-CM | POA: Insufficient documentation

## 2023-10-28 LAB — COMPREHENSIVE METABOLIC PANEL
ALT: 10 U/L (ref 0–44)
AST: 18 U/L (ref 15–41)
Albumin: 3.7 g/dL (ref 3.5–5.0)
Alkaline Phosphatase: 69 U/L (ref 38–126)
Anion gap: 14 (ref 5–15)
BUN: 9 mg/dL (ref 8–23)
CO2: 25 mmol/L (ref 22–32)
Calcium: 8.8 mg/dL — ABNORMAL LOW (ref 8.9–10.3)
Chloride: 93 mmol/L — ABNORMAL LOW (ref 98–111)
Creatinine, Ser: 1.08 mg/dL — ABNORMAL HIGH (ref 0.44–1.00)
GFR, Estimated: 49 mL/min — ABNORMAL LOW (ref >60)
Glucose, Bld: 102 mg/dL — ABNORMAL HIGH (ref 70–99)
Potassium: 4.1 mmol/L (ref 3.5–5.1)
Sodium: 132 mmol/L — ABNORMAL LOW (ref 135–145)
Total Bilirubin: 0.8 mg/dL (ref 0.0–1.2)
Total Protein: 6.3 g/dL — ABNORMAL LOW (ref 6.5–8.1)

## 2023-10-28 LAB — CBC WITH DIFFERENTIAL/PLATELET
Abs Immature Granulocytes: 0.01 10*3/uL (ref 0.00–0.07)
Basophils Absolute: 0.1 10*3/uL (ref 0.0–0.1)
Basophils Relative: 1 %
Eosinophils Absolute: 0.1 10*3/uL (ref 0.0–0.5)
Eosinophils Relative: 2 %
HCT: 35.3 % — ABNORMAL LOW (ref 36.0–46.0)
Hemoglobin: 12.5 g/dL (ref 12.0–15.0)
Immature Granulocytes: 0 %
Lymphocytes Relative: 26 %
Lymphs Abs: 1.9 10*3/uL (ref 0.7–4.0)
MCH: 32.7 pg (ref 26.0–34.0)
MCHC: 35.4 g/dL (ref 30.0–36.0)
MCV: 92.4 fL (ref 80.0–100.0)
Monocytes Absolute: 0.7 10*3/uL (ref 0.1–1.0)
Monocytes Relative: 10 %
Neutro Abs: 4.5 10*3/uL (ref 1.7–7.7)
Neutrophils Relative %: 61 %
Platelets: 248 10*3/uL (ref 150–400)
RBC: 3.82 MIL/uL — ABNORMAL LOW (ref 3.87–5.11)
RDW: 11.5 % (ref 11.5–15.5)
WBC: 7.3 10*3/uL (ref 4.0–10.5)
nRBC: 0 % (ref 0.0–0.2)

## 2023-10-28 LAB — TROPONIN I (HIGH SENSITIVITY): Troponin I (High Sensitivity): 11 ng/L (ref <18)

## 2023-10-28 LAB — LIPASE, BLOOD: Lipase: 30 U/L (ref 11–51)

## 2023-10-28 MED ORDER — PANTOPRAZOLE SODIUM 40 MG IV SOLR
40.0000 mg | Freq: Once | INTRAVENOUS | Status: AC
  Administered 2023-10-28: 40 mg via INTRAVENOUS
  Filled 2023-10-28: qty 10

## 2023-10-28 MED ORDER — ONDANSETRON HCL 4 MG/2ML IJ SOLN
4.0000 mg | Freq: Once | INTRAMUSCULAR | Status: AC
  Administered 2023-10-28: 4 mg via INTRAVENOUS
  Filled 2023-10-28: qty 2

## 2023-10-28 MED ORDER — ONDANSETRON 4 MG PO TBDP: ORAL_TABLET | ORAL | 0 refills | Status: DC

## 2023-10-28 MED ORDER — FAMOTIDINE IN NACL 20-0.9 MG/50ML-% IV SOLN
20.0000 mg | Freq: Once | INTRAVENOUS | Status: AC
  Administered 2023-10-28: 20 mg via INTRAVENOUS
  Filled 2023-10-28: qty 50

## 2023-10-28 MED ORDER — ONDANSETRON 4 MG PO TBDP: 4.0000 mg | ORAL_TABLET | Freq: Once | ORAL | Status: DC

## 2023-10-28 MED ORDER — IOHEXOL 350 MG/ML SOLN
55.0000 mL | Freq: Once | INTRAVENOUS | Status: AC | PRN
  Administered 2023-10-28: 55 mL via INTRAVENOUS

## 2023-10-28 NOTE — ED Provider Triage Note (Signed)
 Emergency Medicine Provider Triage Evaluation Note  Kristen [name] , a 89 y.o. female  was evaluated in triage.  Pt complains of nausea x 2 weeks. Worse in the past few days. No emesis. Normal bowel movements. Describes some epigastric discomfort.   Review of Systems  Positive:  Negative:   Physical Exam  BP 133/79 (BP Location: Right Arm)   Pulse 90   Temp 98.5 F (36.9 C) (Oral)   Resp 15   Ht 5' 2 (1.575 m)   Wt 56.7 kg   LMP  (LMP Unknown)   SpO2 98%   BMI 22.86 kg/m  Gen:   Awake, no distress   Resp:  Normal effort  MSK:   Moves extremities without difficulty  Other:  Abd: mild epigastric and LLQ tenderness, overall nonfocal  Medical Decision Making  Medically screening exam initiated at 4:11 PM.  Appropriate orders placed.  Kristen [name] was informed that the remainder of the evaluation will be completed by another provider, this initial triage assessment does not replace that evaluation, and the importance of remaining in the ED until their evaluation is complete.     Donnajean Lynwood DEL, PA-C 10/28/23 [phone]

## 2023-10-28 NOTE — ED Triage Notes (Signed)
 Lives in [facility]. Here today for mid chest pain and nausea for a few weeks. Hx of GERD. Unable to tolerate PO. EMS gave 4mg  Zofran.  EMS VS: Bp 160/72 HR 74 97% on RA CBG 115

## 2023-10-28 NOTE — Discharge Instructions (Signed)
 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  These can cause you to have more nausea.  I have increased your Zofran  to every 4 hours as needed  Please follow-up with Dr. Shelah to discuss options.  You also should follow-up with your primary care doctor.  As we discussed, Effexor  can potentially cause this issue but you need to follow-up with your psychiatrist about medication adjustment  Return to ER if you have worse vomiting or severe pain

## 2023-10-28 NOTE — ED Notes (Signed)
 Pt is supposed to go to the lobby but pt states she feels like she is going to pass out. Will notify CN.

## 2023-10-28 NOTE — ED Provider Notes (Signed)
 [name] [facility] Provider Note   CSN: [id number] Arrival date & time: 10/28/23  1538     History  Chief Complaint  Patient presents with   Abdominal Pain   Chest Pain    Kristen [name] is a 89 y.o. female history of A-fib on Pradaxa , hypertension and gastritis here presenting with weight loss.  Patient states that for the last several months, patient lost around 10 pounds.  Patient states that she has poor appetite.  She was seen in the ED in October and was diagnosed with gastritis.  Patient was prescribed Carafate  and Pepcid  and Nexium.  Patient was referred to GI but it is unclear if she saw GI doctor.  She states that she has follow-up with primary care doctor and continues to not feel well.  Patient states that she is feeling weak to the point that she wants to get checked out.  She was told that she has lung mass and was concerned that she may have cancer.  The history is provided by the patient.       Home Medications Prior to Admission medications   Medication Sig Start Date End Date Taking? Authorizing Provider  albuterol  (VENTOLIN  HFA) 108 (90 Base) MCG/ACT inhaler Inhale 2 puffs into the lungs every 6 (six) hours as needed for wheezing or shortness of breath. Patient not taking: Reported on 12/23/2022 05/20/22   Malachy Comer GAILS, NP  atorvastatin  (LIPITOR) 20 MG tablet Take 20 mg by mouth daily. 09/26/21   [provider]  cyanocobalamin  (,VITAMIN B-12,) 1000 MCG/ML injection Inject 100 mcg into the muscle every 30 (thirty) days. 11/02/19   [provider]  dabigatran  (PRADAXA ) 150 MG CAPS capsule Take 1 capsule (150 mg total) by mouth every 12 (twelve) hours. 11/26/21 02/24/22  [place], Camellia PARAS, DO  diclofenac  Sodium (VOLTAREN ) 1 % GEL Apply 2 g topically daily. For knee pain    [provider]  DULoxetine  (CYMBALTA ) 60 MG capsule Take 60 mg by mouth at bedtime. 12/05/21   [provider]  esomeprazole  (NEXIUM) 40 MG capsule Take 40 mg by mouth daily. 09/26/21   [provider]  ferrous sulfate  325 (65 FE) MG tablet Take 1 tablet (325 mg total) by mouth daily. 11/26/21 02/24/22  [place], Camellia PARAS, DO  gabapentin  (NEURONTIN ) 300 MG capsule Take 300 mg by mouth daily. 08/29/21   [provider]  HYDROmorphone  (DILAUDID ) 2 MG tablet Take 2 mg by mouth in the morning and at bedtime. 10/03/21   [provider]  ipratropium (ATROVENT ) 0.06 % nasal spray Place 2 sprays into both nostrils daily. 12/30/22   Cobb, Comer GAILS, NP  losartan  (COZAAR ) 50 MG tablet Take 50 mg by mouth daily. 07/28/21   [provider]  meloxicam (MOBIC) 15 MG tablet Take 15 mg by mouth daily. Patient not taking: Reported on 12/15/2021    [provider]  metoCLOPramide  (REGLAN ) 10 MG tablet Take 1 tablet (10 mg total) by mouth every 6 (six) hours. 08/05/23   Doretha Folks, MD  nebivolol  (BYSTOLIC ) 5 MG tablet Take 5 mg by mouth daily. Patient not taking: Reported on 12/15/2021    [provider]  ondansetron  (ZOFRAN -ODT) 4 MG disintegrating tablet 4mg  ODT q4 hours prn nausea/vomit 08/09/23   Patt Alm Macho, MD  primidone  (MYSOLINE ) 50 MG tablet Take 50 mg by mouth at bedtime. 08/26/21   [provider]  sucralfate  (CARAFATE ) 1 g tablet Take 1 tablet (1 g total)  by mouth 4 (four) times daily -  with meals and at bedtime. 08/05/23   Doretha Folks, MD  traZODone  (DESYREL ) 100 MG tablet Take 100 mg by mouth at bedtime. 08/21/21   [provider]      Allergies    Penicillins, Tetracyclines & related, and Sulfa antibiotics    Review of Systems   Review of Systems  Constitutional:  Positive for unexpected weight change.  Cardiovascular:  Positive for chest pain.  Gastrointestinal:  Positive for abdominal pain.  All other systems reviewed and are negative.   Physical Exam Updated Vital Signs BP (!) 172/89   Pulse (!) 32   Temp 98 F (36.7 C) (Oral)    Resp (!) 22   Ht 5' 2 (1.575 m)   Wt 56.7 kg   LMP  (LMP Unknown)   SpO2 98%   BMI 22.86 kg/m  Physical Exam Vitals and nursing note reviewed.  Constitutional:      Comments: Dehydrated   HENT:     Head: Normocephalic.  Eyes:     Extraocular Movements: Extraocular movements intact.  Cardiovascular:     Rate and Rhythm: Normal rate and regular rhythm.     Heart sounds: Normal heart sounds.  Pulmonary:     Effort: Pulmonary effort is normal.     Breath sounds: Normal breath sounds.  Abdominal:     General: Abdomen is flat.     Comments: Mild epigastric tenderness  Skin:    General: Skin is warm.     Capillary Refill: Capillary refill takes less than 2 seconds.  Neurological:     General: No focal deficit present.  Psychiatric:        Mood and Affect: Mood normal.     ED Results / Procedures / Treatments   Labs (all labs ordered are listed, but only abnormal results are displayed) Labs Reviewed  COMPREHENSIVE METABOLIC PANEL - Abnormal; Notable for the following components:      Result Value   Sodium 132 (*)    Chloride 93 (*)    Glucose, Bld 102 (*)    Creatinine, Ser 1.08 (*)    Calcium  8.8 (*)    Total Protein 6.3 (*)    GFR, Estimated 49 (*)    All other components within normal limits  CBC WITH DIFFERENTIAL/PLATELET - Abnormal; Notable for the following components:   RBC 3.82 (*)    HCT 35.3 (*)    All other components within normal limits  LIPASE, BLOOD  URINALYSIS, ROUTINE W REFLEX MICROSCOPIC  TROPONIN I (HIGH SENSITIVITY)    EKG EKG Interpretation Date/Time:  Thursday October 28 2023 15:44:03 EST Ventricular Rate:  91 PR Interval:  144 QRS Duration:  86 QT Interval:  372 QTC Calculation: 457 R Axis:   64  Text Interpretation: Sinus rhythm with Premature atrial complexes Otherwise normal ECG When compared with ECG of 09-Aug-2023 17:07,  Premature atrial complex new since previous Confirmed by Patt Alm DEL [phone]) on 10/28/2023 4:39:57  PM  Radiology DG Chest Port 1 View Result Date: 10/28/2023 CLINICAL DATA:  Several week history of mid chest pain and nausea EXAM: PORTABLE CHEST 1 VIEW COMPARISON:  Chest radiograph dated 05/13/2022 FINDINGS: Overlying leads limit evaluation of the right hemithorax. Normal lung volumes. Irregular, hazy opacity projects over the lateral right upper lung. Left basilar patchy opacity. No pleural effusion or pneumothorax. The heart size and mediastinal contours are within normal limits. No acute osseous abnormality. IMPRESSION: 1. Irregular, hazy opacity projects over the  lateral right upper lung, better evaluated on same-day CT chest. 2. Left basilar patchy opacity, likely atelectasis. Electronically Signed   By: Limin  Xu M.D.   On: 10/28/2023 19:26    Procedures Procedures    Medications Ordered in ED Medications  ondansetron  (ZOFRAN ) injection 4 mg (4 mg Intravenous Given 10/28/23 1703)  famotidine  (PEPCID ) IVPB 20 mg premix (0 mg Intravenous Stopped 10/28/23 1752)  pantoprazole  (PROTONIX ) injection 40 mg (40 mg Intravenous Given 10/28/23 1704)  iohexol  (OMNIPAQUE ) 350 MG/ML injection 55 mL (55 mLs Intravenous Contrast Given 10/28/23 1825)    ED Course/ Medical Decision Making/ A&P                                 Medical Decision Making Kristen [name] is a 89 y.o. female here presenting with weight loss and poor appetite.  Patient appears dehydrated.  Patient has poor appetite and lost about 10 pounds.  Will get CT chest abdomen pelvis and labs to further assess. Will hydrate patient   9:59 PM I reviewed patient's labs and sodium is 132.  CT abdomen pelvis showed increasing size of the lung nodule and a new nodule and also a right renal mass.  Patient has seen Dr. Shelah previously from pulmonology.  At that time he had a discussion with her regarding biopsy and the risks outweigh the benefits.  I recommend that she follow-up with Dr. Shelah again regarding this issue.  However I do not think she  would benefit from biopsy and potential chemo and radiation.  At this point patient is stable for discharge home.  I have updated her and her daughter.  Problems Addressed: Lung mass: acute illness or injury Nausea: chronic illness or injury with exacerbation, progression, or side effects of treatment Renal mass: acute illness or injury  Amount and/or Complexity of Data Reviewed Labs: ordered. Decision-making details documented in ED Course. Radiology: ordered and independent interpretation performed. Decision-making details documented in ED Course.  Risk Prescription drug management.    Final Clinical Impression(s) / ED Diagnoses Final diagnoses:  None    Rx / DC Orders ED Discharge Orders     None         Patt Alm Macho, MD 10/28/23 2201

## 2023-10-29 ENCOUNTER — Emergency Department (HOSPITAL_COMMUNITY): Payer: Medicare Other

## 2023-10-29 ENCOUNTER — Encounter (HOSPITAL_COMMUNITY): Payer: Self-pay

## 2023-10-29 ENCOUNTER — Inpatient Hospital Stay (HOSPITAL_COMMUNITY)
Admission: EM | Admit: 2023-10-29 | Discharge: 2023-11-04 | DRG: 522 | Disposition: A | Discharge status: Skilled Nursing Facility | Payer: Medicare PPO | Source: Skilled Nursing Facility | Attending: Internal Medicine | Admitting: Internal Medicine

## 2023-10-29 DIAGNOSIS — Z9049 Acquired absence of other specified parts of digestive tract: Secondary | ICD-10-CM

## 2023-10-29 DIAGNOSIS — Z66 Do not resuscitate: Secondary | ICD-10-CM | POA: Diagnosis present

## 2023-10-29 DIAGNOSIS — S72011A Unspecified intracapsular fracture of right femur, initial encounter for closed fracture: Secondary | ICD-10-CM | POA: Diagnosis present

## 2023-10-29 DIAGNOSIS — G500 Trigeminal neuralgia: Secondary | ICD-10-CM | POA: Diagnosis present

## 2023-10-29 DIAGNOSIS — K219 Gastro-esophageal reflux disease without esophagitis: Secondary | ICD-10-CM | POA: Diagnosis present

## 2023-10-29 DIAGNOSIS — K2970 Gastritis, unspecified, without bleeding: Secondary | ICD-10-CM | POA: Diagnosis present

## 2023-10-29 DIAGNOSIS — M549 Dorsalgia, unspecified: Secondary | ICD-10-CM | POA: Diagnosis present

## 2023-10-29 DIAGNOSIS — F32A Depression, unspecified: Secondary | ICD-10-CM | POA: Diagnosis present

## 2023-10-29 DIAGNOSIS — N281 Cyst of kidney, acquired: Secondary | ICD-10-CM | POA: Diagnosis present

## 2023-10-29 DIAGNOSIS — E785 Hyperlipidemia, unspecified: Secondary | ICD-10-CM | POA: Diagnosis present

## 2023-10-29 DIAGNOSIS — Z79899 Other long term (current) drug therapy: Secondary | ICD-10-CM

## 2023-10-29 DIAGNOSIS — Z6822 Body mass index (BMI) 22.0-22.9, adult: Secondary | ICD-10-CM

## 2023-10-29 DIAGNOSIS — N2889 Other specified disorders of kidney and ureter: Secondary | ICD-10-CM | POA: Diagnosis present

## 2023-10-29 DIAGNOSIS — R630 Anorexia: Secondary | ICD-10-CM | POA: Diagnosis present

## 2023-10-29 DIAGNOSIS — D72829 Elevated white blood cell count, unspecified: Secondary | ICD-10-CM | POA: Diagnosis present

## 2023-10-29 DIAGNOSIS — Z880 Allergy status to penicillin: Secondary | ICD-10-CM

## 2023-10-29 DIAGNOSIS — W010XXA Fall on same level from slipping, tripping and stumbling without subsequent striking against object, initial encounter: Secondary | ICD-10-CM | POA: Diagnosis present

## 2023-10-29 DIAGNOSIS — Z90710 Acquired absence of both cervix and uterus: Secondary | ICD-10-CM

## 2023-10-29 DIAGNOSIS — R634 Abnormal weight loss: Secondary | ICD-10-CM | POA: Diagnosis present

## 2023-10-29 DIAGNOSIS — Z7901 Long term (current) use of anticoagulants: Secondary | ICD-10-CM | POA: Diagnosis not present

## 2023-10-29 DIAGNOSIS — I4891 Unspecified atrial fibrillation: Secondary | ICD-10-CM | POA: Diagnosis present

## 2023-10-29 DIAGNOSIS — Y92099 Unspecified place in other non-institutional residence as the place of occurrence of the external cause: Secondary | ICD-10-CM

## 2023-10-29 DIAGNOSIS — Z96653 Presence of artificial knee joint, bilateral: Secondary | ICD-10-CM | POA: Diagnosis present

## 2023-10-29 DIAGNOSIS — S72001A Fracture of unspecified part of neck of right femur, initial encounter for closed fracture: Secondary | ICD-10-CM | POA: Diagnosis present

## 2023-10-29 DIAGNOSIS — Z85118 Personal history of other malignant neoplasm of bronchus and lung: Secondary | ICD-10-CM

## 2023-10-29 DIAGNOSIS — Z96641 Presence of right artificial hip joint: Secondary | ICD-10-CM

## 2023-10-29 DIAGNOSIS — G8929 Other chronic pain: Secondary | ICD-10-CM | POA: Diagnosis present

## 2023-10-29 DIAGNOSIS — Z791 Long term (current) use of non-steroidal anti-inflammatories (NSAID): Secondary | ICD-10-CM

## 2023-10-29 DIAGNOSIS — Z9849 Cataract extraction status, unspecified eye: Secondary | ICD-10-CM

## 2023-10-29 DIAGNOSIS — G40909 Epilepsy, unspecified, not intractable, without status epilepticus: Secondary | ICD-10-CM | POA: Diagnosis present

## 2023-10-29 DIAGNOSIS — E860 Dehydration: Secondary | ICD-10-CM | POA: Diagnosis present

## 2023-10-29 DIAGNOSIS — I10 Essential (primary) hypertension: Secondary | ICD-10-CM | POA: Diagnosis present

## 2023-10-29 DIAGNOSIS — Z882 Allergy status to sulfonamides status: Secondary | ICD-10-CM

## 2023-10-29 DIAGNOSIS — Y93E3 Activity, vacuuming: Secondary | ICD-10-CM | POA: Diagnosis not present

## 2023-10-29 DIAGNOSIS — Z9889 Other specified postprocedural states: Secondary | ICD-10-CM

## 2023-10-29 DIAGNOSIS — Z881 Allergy status to other antibiotic agents status: Secondary | ICD-10-CM

## 2023-10-29 DIAGNOSIS — R918 Other nonspecific abnormal finding of lung field: Secondary | ICD-10-CM | POA: Diagnosis present

## 2023-10-29 DIAGNOSIS — F419 Anxiety disorder, unspecified: Secondary | ICD-10-CM | POA: Diagnosis present

## 2023-10-29 DIAGNOSIS — I272 Pulmonary hypertension, unspecified: Secondary | ICD-10-CM | POA: Diagnosis present

## 2023-10-29 DIAGNOSIS — Z86711 Personal history of pulmonary embolism: Secondary | ICD-10-CM | POA: Diagnosis not present

## 2023-10-29 DIAGNOSIS — Z9089 Acquired absence of other organs: Secondary | ICD-10-CM

## 2023-10-29 DIAGNOSIS — Z87892 Personal history of anaphylaxis: Secondary | ICD-10-CM

## 2023-10-29 DIAGNOSIS — Z85528 Personal history of other malignant neoplasm of kidney: Secondary | ICD-10-CM

## 2023-10-29 HISTORY — DX: Chronic kidney disease, unspecified: N18.9

## 2023-10-29 HISTORY — DX: Other specified postprocedural states: Z98.890

## 2023-10-29 HISTORY — DX: Nausea with vomiting, unspecified: R11.2

## 2023-10-29 LAB — URINALYSIS, ROUTINE W REFLEX MICROSCOPIC
Bacteria, UA: NONE SEEN
Bilirubin Urine: NEGATIVE
Glucose, UA: NEGATIVE mg/dL
Hgb urine dipstick: NEGATIVE
Ketones, ur: NEGATIVE mg/dL
Nitrite: NEGATIVE
Protein, ur: 30 mg/dL — AB
Specific Gravity, Urine: 1.032 — ABNORMAL HIGH (ref 1.005–1.030)
pH: 5 (ref 5.0–8.0)

## 2023-10-29 LAB — COMPREHENSIVE METABOLIC PANEL
ALT: 20 U/L (ref 0–44)
AST: 24 U/L (ref 15–41)
Albumin: 4.5 g/dL (ref 3.5–5.0)
Alkaline Phosphatase: 78 U/L (ref 38–126)
Anion gap: 13 (ref 5–15)
BUN: 19 mg/dL (ref 8–23)
CO2: 24 mmol/L (ref 22–32)
Calcium: 9.4 mg/dL (ref 8.9–10.3)
Chloride: 95 mmol/L — ABNORMAL LOW (ref 98–111)
Creatinine, Ser: 1.2 mg/dL — ABNORMAL HIGH (ref 0.44–1.00)
GFR, Estimated: 44 mL/min — ABNORMAL LOW (ref >60)
Glucose, Bld: 136 mg/dL — ABNORMAL HIGH (ref 70–99)
Potassium: 4 mmol/L (ref 3.5–5.1)
Sodium: 132 mmol/L — ABNORMAL LOW (ref 135–145)
Total Bilirubin: 0.9 mg/dL (ref 0.0–1.2)
Total Protein: 7.4 g/dL (ref 6.5–8.1)

## 2023-10-29 LAB — I-STAT CHEM 8, ED
BUN: 20 mg/dL (ref 8–23)
Calcium, Ion: 1.08 mmol/L — ABNORMAL LOW (ref 1.15–1.40)
Chloride: 96 mmol/L — ABNORMAL LOW (ref 98–111)
Creatinine, Ser: 1.2 mg/dL — ABNORMAL HIGH (ref 0.44–1.00)
Glucose, Bld: 131 mg/dL — ABNORMAL HIGH (ref 70–99)
HCT: 41 % (ref 36.0–46.0)
Hemoglobin: 13.9 g/dL (ref 12.0–15.0)
Potassium: 4.1 mmol/L (ref 3.5–5.1)
Sodium: 133 mmol/L — ABNORMAL LOW (ref 135–145)
TCO2: 24 mmol/L (ref 22–32)

## 2023-10-29 LAB — CBC
HCT: 39.9 % (ref 36.0–46.0)
Hemoglobin: 13.7 g/dL (ref 12.0–15.0)
MCH: 31.9 pg (ref 26.0–34.0)
MCHC: 34.3 g/dL (ref 30.0–36.0)
MCV: 92.8 fL (ref 80.0–100.0)
Platelets: 259 10*3/uL (ref 150–400)
RBC: 4.3 MIL/uL (ref 3.87–5.11)
RDW: 11.6 % (ref 11.5–15.5)
WBC: 21 10*3/uL — ABNORMAL HIGH (ref 4.0–10.5)
nRBC: 0 % (ref 0.0–0.2)

## 2023-10-29 LAB — ETHANOL: Alcohol, Ethyl (B): <10 mg/dL (ref <10)

## 2023-10-29 LAB — SAMPLE TO BLOOD BANK

## 2023-10-29 LAB — I-STAT CG4 LACTIC ACID, ED: Lactic Acid, Venous: 1.2 mmol/L (ref 0.5–1.9)

## 2023-10-29 LAB — PROTIME-INR
INR: 1.5 — ABNORMAL HIGH (ref 0.8–1.2)
Prothrombin Time: 18.7 s — ABNORMAL HIGH (ref 11.4–15.2)

## 2023-10-29 MED ORDER — HYDROMORPHONE HCL 1 MG/ML IJ SOLN
0.5000 mg | INTRAMUSCULAR | Status: DC | PRN
  Administered 2023-10-29 – 2023-10-30 (×3): 0.5 mg via INTRAVENOUS
  Filled 2023-10-29 (×3): qty 1

## 2023-10-29 MED ORDER — POVIDONE-IODINE 10 % EX SWAB
2.0000 | Freq: Once | CUTANEOUS | Status: AC
  Administered 2023-10-30: 2 via TOPICAL

## 2023-10-29 MED ORDER — PANTOPRAZOLE SODIUM 40 MG PO TBEC
80.0000 mg | DELAYED_RELEASE_TABLET | Freq: Every day | ORAL | Status: DC
  Administered 2023-10-30 – 2023-11-04 (×6): 80 mg via ORAL
  Filled 2023-10-29 (×5): qty 2
  Filled 2023-10-29: qty 4

## 2023-10-29 MED ORDER — HYDROCODONE-ACETAMINOPHEN 5-325 MG PO TABS: 1.0000 | ORAL_TABLET | Freq: Four times a day (QID) | ORAL | Status: DC | PRN

## 2023-10-29 MED ORDER — LOSARTAN POTASSIUM 50 MG PO TABS: 25.0000 mg | ORAL_TABLET | Freq: Every day | ORAL | Status: DC

## 2023-10-29 MED ORDER — CEFAZOLIN SODIUM-DEXTROSE 2-4 GM/100ML-% IV SOLN
2.0000 g | INTRAVENOUS | Status: AC
  Administered 2023-10-30: 2 g via INTRAVENOUS
  Filled 2023-10-29: qty 100

## 2023-10-29 MED ORDER — HYDROMORPHONE HCL 1 MG/ML IJ SOLN
0.5000 mg | INTRAMUSCULAR | Status: DC | PRN
  Administered 2023-10-29: 0.5 mg via INTRAVENOUS
  Filled 2023-10-29: qty 1

## 2023-10-29 MED ORDER — ONDANSETRON HCL 4 MG/2ML IJ SOLN
4.0000 mg | Freq: Once | INTRAMUSCULAR | Status: AC
  Administered 2023-10-29: 4 mg via INTRAVENOUS
  Filled 2023-10-29: qty 2

## 2023-10-29 MED ORDER — LABETALOL HCL 5 MG/ML IV SOLN
5.0000 mg | INTRAVENOUS | Status: DC | PRN
  Administered 2023-10-29 – 2023-10-30 (×2): 5 mg via INTRAVENOUS
  Filled 2023-10-29 (×2): qty 4

## 2023-10-29 MED ORDER — ATORVASTATIN CALCIUM 10 MG PO TABS
20.0000 mg | ORAL_TABLET | Freq: Every day | ORAL | Status: DC
  Administered 2023-10-31 – 2023-11-04 (×5): 20 mg via ORAL
  Filled 2023-10-29 (×6): qty 2

## 2023-10-29 MED ORDER — CHLORHEXIDINE GLUCONATE 4 % EX SOLN
60.0000 mL | Freq: Once | CUTANEOUS | Status: DC
  Filled 2023-10-29: qty 60

## 2023-10-29 MED ORDER — MORPHINE SULFATE (PF) 4 MG/ML IV SOLN
4.0000 mg | Freq: Once | INTRAVENOUS | Status: AC
  Administered 2023-10-29: 4 mg via INTRAVENOUS
  Filled 2023-10-29: qty 1

## 2023-10-29 MED ORDER — TRAZODONE HCL 50 MG PO TABS
100.0000 mg | ORAL_TABLET | Freq: Every day | ORAL | Status: DC
  Administered 2023-10-29 – 2023-10-30 (×2): 100 mg via ORAL
  Filled 2023-10-29 (×2): qty 2

## 2023-10-29 MED ORDER — GABAPENTIN 300 MG PO CAPS
300.0000 mg | ORAL_CAPSULE | Freq: Two times a day (BID) | ORAL | Status: DC
  Administered 2023-10-29 – 2023-11-04 (×11): 300 mg via ORAL
  Filled 2023-10-29 (×12): qty 1

## 2023-10-29 MED ORDER — TRANEXAMIC ACID-NACL 1000-0.7 MG/100ML-% IV SOLN
1000.0000 mg | INTRAVENOUS | Status: AC
  Administered 2023-10-30: 1000 mg via INTRAVENOUS

## 2023-10-29 NOTE — Assessment & Plan Note (Addendum)
 New onset leukocytosis (not present on CBC in ED yesterday when she was getting seen for lung mass), probably reactive due to trauma. Repeat CBC (with diff) in AM. Watch for development of other SIRS which might suggest underlying infection.

## 2023-10-29 NOTE — H&P (Signed)
 History and Physical    Patient: Kristen [name] FMW:[id number] DOB: 1933-01-29 DOA: 10/29/2023 DOS: the patient was seen and examined on 10/29/2023 PCP: Charlott Dorn LABOR, MD  Patient coming from: ALF/ILF  Chief Complaint:  Chief Complaint  Patient presents with   Fall   HPI: Kristen [name] is a 89 y.o. female with medical history significant of HTN, PAH, PE in 2023 now on chronic eliquis .  Pt with diagnosis on imaging recently of new possible lung CA (enlarging now 3.6cm RUL mass), may or may not have renal CA as well (2cm renal mass).  10lb wt loss recently and generalized weakness are her associated symptoms.  Today pt vacuming at her ILF, tripped, had mechanical fall, injuring her R hip.  Severe R hip pain and inability to bear wt.  In to ED.  Review of Systems: As mentioned in the history of present illness. All other systems reviewed and are negative. Past Medical History:  Diagnosis Date   Anemia    Arthritis    B12 deficiency    Chronic pain disorder    trigeminal neuralgia   Depression    Dyslipidemia    Hypertension    Seizure disorder (HCC)    decades since last seizure   Tremor    Trigeminal nerve disorder    Past Surgical History:  Procedure Laterality Date   ABDOMINAL HYSTERECTOMY     BLADDER REPAIR     tack   CATARACT EXTRACTION     CHOLECYSTECTOMY     gamma knife  2004   NOSE SURGERY     for fracture   SHOULDER ARTHROSCOPY DISTAL CLAVICLE EXCISION AND OPEN ROTATOR CUFF REPAIR Left    rotary cuff repair   TONSILLECTOMY     TOTAL KNEE ARTHROPLASTY Bilateral    UMBILICAL HERNIA REPAIR     Social History:  reports that she has never smoked. She has never been exposed to tobacco smoke. She has never used smokeless tobacco. She reports that she does not currently use alcohol. She reports that she does not use drugs.  Allergies  Allergen Reactions   Penicillins Hives and Rash    Other reaction(s): Other unknown    Tetracyclines & Related Anaphylaxis     Other reaction(s): Other unknown    Sulfa Antibiotics Hives and Other (See Comments)    unknown    History reviewed. No pertinent family history.  Prior to Admission medications   Medication Sig Start Date End Date Taking? Authorizing Provider  ELIQUIS  5 MG TABS tablet Take 5 mg by mouth 2 (two) times daily. 09/28/22  Yes [provider]  gabapentin  (NEURONTIN ) 300 MG capsule Take 300 mg by mouth 2 (two) times daily. 08/29/21  Yes [provider]  HYDROmorphone  (DILAUDID ) 2 MG tablet Take 2 mg by mouth in the morning and at bedtime. 10/03/21  Yes [provider]  albuterol  (VENTOLIN  HFA) 108 (90 Base) MCG/ACT inhaler Inhale 2 puffs into the lungs every 6 (six) hours as needed for wheezing or shortness of breath. Patient not taking: Reported on 12/23/2022 05/20/22   Malachy Comer GAILS, NP  atorvastatin  (LIPITOR) 20 MG tablet Take 20 mg by mouth daily. 09/26/21   [provider]  cyanocobalamin  (,VITAMIN B-12,) 1000 MCG/ML injection Inject 100 mcg into the muscle every 30 (thirty) days. 11/02/19   [provider]  dabigatran  (PRADAXA ) 150 MG CAPS capsule Take 1 capsule (150 mg total) by mouth every 12 (twelve) hours. 11/26/21 02/24/22  [place], Eric J, DO  diclofenac  Sodium (  VOLTAREN ) 1 % GEL Apply 2 g topically daily. For knee pain    [provider]  DULoxetine  (CYMBALTA ) 60 MG capsule Take 60 mg by mouth at bedtime. Patient not taking: Reported on 10/29/2023 12/05/21   [provider]  esomeprazole (NEXIUM) 40 MG capsule Take 40 mg by mouth daily. 09/26/21   [provider]  ferrous sulfate  325 (65 FE) MG tablet Take 1 tablet (325 mg total) by mouth daily. 11/26/21 02/24/22  [place], Camellia PARAS, DO  ipratropium (ATROVENT ) 0.06 % nasal spray Place 2 sprays into both nostrils daily. 12/30/22   Cobb, Comer GAILS, NP  losartan  (COZAAR ) 50 MG tablet Take 50 mg by mouth daily. 07/28/21   [provider]  meloxicam (MOBIC) 15 MG tablet Take 15  mg by mouth daily. Patient not taking: Reported on 12/15/2021    [provider]  metoCLOPramide  (REGLAN ) 10 MG tablet Take 1 tablet (10 mg total) by mouth every 6 (six) hours. 08/05/23   Doretha Folks, MD  nebivolol  (BYSTOLIC ) 5 MG tablet Take 5 mg by mouth daily. Patient not taking: Reported on 12/15/2021    [provider]  ondansetron  (ZOFRAN -ODT) 4 MG disintegrating tablet 4mg  ODT q4 hours prn nausea/vomit 08/09/23   Patt Alm Macho, MD  ondansetron  (ZOFRAN -ODT) 4 MG disintegrating tablet 4mg  ODT q6 hours prn nausea/vomit 10/28/23   Patt Alm Macho, MD  primidone  (MYSOLINE ) 50 MG tablet Take 50 mg by mouth at bedtime. Patient not taking: Reported on 10/29/2023 08/26/21   [provider]  sucralfate  (CARAFATE ) 1 g tablet Take 1 tablet (1 g total) by mouth 4 (four) times daily -  with meals and at bedtime. 08/05/23   Doretha Folks, MD  traZODone  (DESYREL ) 100 MG tablet Take 100 mg by mouth at bedtime. 08/21/21   [provider]    Physical Exam: Vitals:   10/29/23 1748 10/29/23 1900 10/29/23 1930 10/29/23 2000  BP: (!) 140/75 (!) 169/80 (!) 159/138 (!) 203/97  Pulse:  80 77 61  Resp:  (!) 29 18 (!) 35  Temp:      TempSrc:      SpO2:  97% 91% 96%   Constitutional: Uncomfortable with ongoing hip pain Respiratory: clear to auscultation bilaterally, no wheezing, no crackles. Normal respiratory effort. No accessory muscle use.  Cardiovascular: Regular rate and rhythm, no murmurs / rubs / gallops. No extremity edema. 2+ pedal pulses. No carotid bruits.  Abdomen: no tenderness, no masses palpated. No hepatosplenomegaly. Bowel sounds positive.  Musculoskeletal: RLE TTP Neurologic: CN 2-12 grossly intact. Sensation intact, DTR normal. Strength 5/5 in all 4.  Psychiatric: Normal judgment and insight. Alert and oriented x 3. Normal mood.   Data Reviewed:    Labs on Admission: I have personally reviewed following labs and imaging  studies  CBC: Recent Labs  Lab 10/28/23 1550 10/29/23 1714 10/29/23 1745  WBC 7.3 21.0*  --   NEUTROABS 4.5  --   --   HGB 12.5 13.7 13.9  HCT 35.3* 39.9 41.0  MCV 92.4 92.8  --   PLT 248 259  --    Basic Metabolic Panel: Recent Labs  Lab 10/28/23 1550 10/29/23 1714 10/29/23 1745  NA 132* 132* 133*  K 4.1 4.0 4.1  CL 93* 95* 96*  CO2 25 24  --   GLUCOSE 102* 136* 131*  BUN 9 19 20   CREATININE 1.08* 1.20* 1.20*  CALCIUM  8.8* 9.4  --    GFR: Estimated Creatinine Clearance: 25.6 mL/min (A) (by C-G  formula based on SCr of 1.2 mg/dL (H)). Liver Function Tests: Recent Labs  Lab 10/28/23 1550 10/29/23 1714  AST 18 24  ALT 10 20  ALKPHOS 69 78  BILITOT 0.8 0.9  PROT 6.3* 7.4  ALBUMIN  3.7 4.5   Recent Labs  Lab 10/28/23 1550  LIPASE 30   No results for input(s): AMMONIA in the last 168 hours. Coagulation Profile: Recent Labs  Lab 10/29/23 1714  INR 1.5*   Cardiac Enzymes: No results for input(s): CKTOTAL, CKMB, CKMBINDEX, TROPONINI in the last 168 hours. BNP (last 3 results) No results for input(s): PROBNP in the last 8760 hours. HbA1C: No results for input(s): HGBA1C in the last 72 hours. CBG: No results for input(s): GLUCAP in the last 168 hours. Lipid Profile: No results for input(s): CHOL, HDL, LDLCALC, TRIG, CHOLHDL, LDLDIRECT in the last 72 hours. Thyroid  Function Tests: No results for input(s): TSH, T4TOTAL, FREET4, T3FREE, THYROIDAB in the last 72 hours. Anemia Panel: No results for input(s): VITAMINB12, FOLATE, FERRITIN, TIBC, IRON, RETICCTPCT in the last 72 hours. Urine analysis:    Component Value Date/Time   COLORURINE YELLOW 08/05/2023 0946   APPEARANCEUR CLOUDY (A) 08/05/2023 0946   LABSPEC 1.015 08/05/2023 0946   PHURINE 7.0 08/05/2023 0946   GLUCOSEU NEGATIVE 08/05/2023 0946   HGBUR TRACE (A) 08/05/2023 0946   BILIRUBINUR NEGATIVE 08/05/2023 0946   KETONESUR NEGATIVE 08/05/2023  0946   PROTEINUR NEGATIVE 08/05/2023 0946   NITRITE NEGATIVE 08/05/2023 0946   LEUKOCYTESUR MODERATE (A) 08/05/2023 0946    Radiological Exams on Admission: CT HEAD WO CONTRAST Result Date: 10/29/2023 CLINICAL DATA:  Head trauma, moderate-severe.  Fall. EXAM: CT HEAD WITHOUT CONTRAST TECHNIQUE: Contiguous axial images were obtained from the base of the skull through the vertex without intravenous contrast. RADIATION DOSE REDUCTION: This exam was performed according to the departmental dose-optimization program which includes automated exposure control, adjustment of the mA and/or kV according to patient size and/or use of iterative reconstruction technique. COMPARISON:  10/13/2022 FINDINGS: Brain: There is atrophy and chronic small vessel disease changes. No acute intracranial abnormality. Specifically, no hemorrhage, hydrocephalus, mass lesion, acute infarction, or significant intracranial injury. Vascular: No hyperdense vessel or unexpected calcification. Skull: Prior left temporal craniectomy. No acute calvarial abnormality. Sinuses/Orbits: No acute findings Other: None IMPRESSION: Atrophy, chronic microvascular disease. No acute intracranial abnormality. Electronically Signed   By: Franky Crease M.D.   On: 10/29/2023 20:25   DG Chest Portable 1 View Result Date: 10/29/2023 CLINICAL DATA:  89 year old female tripped over vacuum cleaner and landed on right hip. Subsequent back pain. EXAM: PORTABLE CHEST 1 VIEW COMPARISON:  CT 10/28/2023 FINDINGS: Redemonstrated airspace opacities in the right upper lobe laterally. Bibasilar atelectasis. The lungs are otherwise clear. No pleural effusion or pneumothorax. Normal cardiomediastinal silhouette. Aortic atherosclerotic calcification. No displaced rib fractures. IMPRESSION: 1. No change from 10/28/2023. Right upper lobe airspace opacity better evaluated on prior CT. Electronically Signed   By: Norman Gatlin M.D.   On: 10/29/2023 19:20   DG Pelvis  Portable Result Date: 10/29/2023 CLINICAL DATA:  89 year old female status post fall landing on right hip. Right leg shoulder than left upon arrival. EXAM: PORTABLE PELVIS 1-2 VIEWS; DG HIP (WITH OR WITHOUT PELVIS) 1V PORT RIGHT COMPARISON:  CT 10/28/2023 FINDINGS: Acute mildly impacted subcapital fracture of the right femoral neck. The femoral head remains located in the acetabulum. Contrast from CT performed yesterday within the bladder. IMPRESSION: Acute mildly impacted subcapital fracture of the right femoral neck. Electronically Signed   By:  Norman Gatlin M.D.   On: 10/29/2023 19:15   DG Hip Port Unilat W or Wo Pelvis 1 View Right Result Date: 10/29/2023 CLINICAL DATA:  89 year old female status post fall landing on right hip. Right leg shoulder than left upon arrival. EXAM: PORTABLE PELVIS 1-2 VIEWS; DG HIP (WITH OR WITHOUT PELVIS) 1V PORT RIGHT COMPARISON:  CT 10/28/2023 FINDINGS: Acute mildly impacted subcapital fracture of the right femoral neck. The femoral head remains located in the acetabulum. Contrast from CT performed yesterday within the bladder. IMPRESSION: Acute mildly impacted subcapital fracture of the right femoral neck. Electronically Signed   By: Norman Gatlin M.D.   On: 10/29/2023 19:15   CT CHEST ABDOMEN PELVIS W CONTRAST Result Date: 10/28/2023 CLINICAL DATA:  Weight loss, unintended hx of gastritis and lung mass EXAM: CT CHEST, ABDOMEN, AND PELVIS WITH CONTRAST TECHNIQUE: Multidetector CT imaging of the chest, abdomen and pelvis was performed following the standard protocol during bolus administration of intravenous contrast. RADIATION DOSE REDUCTION: This exam was performed according to the departmental dose-optimization program which includes automated exposure control, adjustment of the mA and/or kV according to patient size and/or use of iterative reconstruction technique. CONTRAST:  55mL OMNIPAQUE  IOHEXOL  350 MG/ML SOLN COMPARISON:  CT chest abdomen pelvis 08/05/2023 FINDINGS:  CT CHEST FINDINGS Cardiovascular: Normal heart size. No significant pericardial effusion. The thoracic aorta is normal in caliber. Moderate atherosclerotic plaque of the thoracic aorta. At least 3 vessel coronary artery calcifications. Aortic valve is a calcification. The main pulmonary artery is normal in caliber. No central or proximal segmental pulmonary embolus. Limited evaluation more distally. Mediastinum/Nodes: No enlarged mediastinal, hilar, or axillary lymph nodes. Thyroid  gland, trachea, and esophagus demonstrate no significant findings. Lungs/Pleura: No focal consolidation. Interval increase in size of a subsolid right apical lung mass measuring 3.6 x 2.9 cm (from 3.1 x 2.4 cm). Associated pleural tethering. Interval development of a right lower lobe posterior base subpleural 0.5 x 0.4 cm pulmonary nodule (5:116). Subpleural micronodule triangular nodes along the fissures suggestive intrapulmonary lymph nodes. Stable left upper lobe subpleural calcified micronodule (100:36). No pleural effusion. No pneumothorax. Musculoskeletal: No chest wall abnormality. No suspicious lytic or blastic osseous lesions. No acute displaced fracture. Diffusely decreased bone density. Multilevel degenerative changes. CT ABDOMEN PELVIS FINDINGS Hepatobiliary: No focal liver abnormality. Status post cholecystectomy. No biliary dilatation. Pancreas: Diffusely atrophic. No focal lesion. Otherwise normal pancreatic contour. No surrounding inflammatory changes. No main pancreatic ductal dilatation. Spleen: Normal in size without focal abnormality. Adrenals/Urinary Tract: No adrenal nodule bilaterally. Bilateral kidneys enhance symmetrically. There is a 2 cm right renal hypodense lesion with a density of 34 on spilled units. Fluid density lesions likely represent simple renal cysts. Simple renal cysts, in the absence of clinically indicated signs/symptoms, require no independent follow-up. Subcentimeter hypodensities are too small  to characterize. No hydronephrosis. No hydroureter. The urinary bladder is unremarkable. Stomach/Bowel: Stomach is within normal limits in the setting of under distension. No evidence of bowel wall thickening or dilatation. Status post appendectomy. Vascular/Lymphatic: No abdominal aorta or iliac aneurysm. Severe atherosclerotic plaque of the aorta and its branches. No abdominal, pelvic, or inguinal lymphadenopathy. Reproductive: Status post hysterectomy. No adnexal masses. Other: No intraperitoneal free fluid. No intraperitoneal free gas. No organized fluid collection. Musculoskeletal: No abdominal wall hernia or abnormality. No suspicious lytic or blastic osseous lesions. No acute displaced fracture. Diffusely decreased bone density. Multilevel severe degenerative changes of the spine. Several vertebral body hemangiomas. IMPRESSION: 1. Interval increase in size of a subsolid right apical  lung mass measuring 3.6 x 2.9 cm. Additional imaging evaluation or consultation with Pulmonology or Thoracic Surgery recommended. 2. Interval development of a right lower lobe posterior base subpleural 0.5 x 0.4 cm pulmonary nodule. 3. Indeterminate 2 cm right renal mass. Underlying malignancy not excluded. When the patient is clinically stable and able to follow directions and hold their breath (preferably as an outpatient) further evaluation with dedicated MRI renal protocol should be considered. 4. Aortic Atherosclerosis (ICD10-I70.0) including at least 3 vessel coronary calcification and aortic valve leaflet calcifications-Kohler late for aortic stenosis. Electronically Signed   By: Morgane  Naveau M.D.   On: 10/28/2023 20:46   DG Chest Port 1 View Result Date: 10/28/2023 CLINICAL DATA:  Several week history of mid chest pain and nausea EXAM: PORTABLE CHEST 1 VIEW COMPARISON:  Chest radiograph dated 05/13/2022 FINDINGS: Overlying leads limit evaluation of the right hemithorax. Normal lung volumes. Irregular, hazy opacity  projects over the lateral right upper lung. Left basilar patchy opacity. No pleural effusion or pneumothorax. The heart size and mediastinal contours are within normal limits. No acute osseous abnormality. IMPRESSION: 1. Irregular, hazy opacity projects over the lateral right upper lung, better evaluated on same-day CT chest. 2. Left basilar patchy opacity, likely atelectasis. Electronically Signed   By: Limin  Xu M.D.   On: 10/28/2023 19:26    EKG: Independently reviewed.   Assessment and Plan: * Closed right hip fracture, initial encounter (HCC) Hip fx pathway OR planned for tomorrow per Dr. Celena: NPO after MN Hold eliquis , use SCDs pending surgery IV dilaudid  PRN pain GUPTA score = 1.0-2.5% depending if we call her ASA 3 or 4 with the likely lung CA.  Pulmonary mass RUL pulmonary mass increased in size on CT scan yesterday, worrisome for malignancy. Likely follow up outpt at this point with pulm.  Leukocytosis New onset leukocytosis (not present on CBC in ED yesterday when she was getting seen for lung mass), probably reactive due to trauma. Repeat CBC (with diff) in AM. Watch for development of other SIRS which might suggest underlying infection.  Renal mass Also 2cm renal mass, consider MRI when stable.  History of pulmonary embolism Hold eliquis  due to planned OR tomorrow  Essential hypertension Cont home BP meds      Advance Care Planning:   Code Status: Do not attempt resuscitation (DNR) PRE-ARREST INTERVENTIONS DESIRED  Consults: Dr. Celena  Family Communication: No family in room  Severity of Illness: The appropriate patient status for this patient is INPATIENT. Inpatient status is judged to be reasonable and necessary in order to provide the required intensity of service to ensure the patient's safety. The patient's presenting symptoms, physical exam findings, and initial radiographic and laboratory data in the context of their chronic comorbidities is felt to  place them at high risk for further clinical deterioration. Furthermore, it is not anticipated that the patient will be medically stable for discharge from the hospital within 2 midnights of admission.   * I certify that at the point of admission it is my clinical judgment that the patient will require inpatient hospital care spanning beyond 2 midnights from the point of admission due to high intensity of service, high risk for further deterioration and high frequency of surveillance required.*  Author: Daphne Karrer M., DO 10/29/2023 8:19 PM  For on call review [email or web address].

## 2023-10-29 NOTE — Assessment & Plan Note (Addendum)
 RUL pulmonary mass increased in size on CT scan yesterday, worrisome for malignancy. Likely follow up outpt at this point with pulm.

## 2023-10-29 NOTE — Assessment & Plan Note (Signed)
Cont home BP meds ?

## 2023-10-29 NOTE — Assessment & Plan Note (Addendum)
 Hip fx pathway OR planned for tomorrow per Dr. Carola Frost: NPO after MN Hold eliquis, use SCDs pending surgery IV dilaudid PRN pain GUPTA score = 1.0-2.5% depending if we call her ASA 3 or 4 with the likely lung CA.

## 2023-10-29 NOTE — Assessment & Plan Note (Addendum)
 Hold eliquis due to planned OR tomorrow

## 2023-10-29 NOTE — Assessment & Plan Note (Addendum)
 Also 2cm renal mass, consider MRI when stable.

## 2023-10-29 NOTE — ED Triage Notes (Addendum)
 Pt arrives to the er via ems from home, [facility]. Possible shortening to right leg. Pt c/o right leg, hip pain. Pt is on eliquis , currently has lung and kidney cancer. VSS per ems. 200mcg given via ems. Pt told ems she might have hit her head.

## 2023-10-29 NOTE — Progress Notes (Addendum)
 Consult request received for displaced right femoral neck fracture. Have discussed with the requesting MD patient's stability, pain control, and ongoing work up. I have reviewed x-rays and developed a provisional plan for arthroplasty tomorrow by myself or one of my colleagues.  Full consultation to follow in the am. NPO post MN. Will check knee films.  Ozell Bruch, MD [facility], [facility] [phone]

## 2023-10-29 NOTE — ED Provider Notes (Signed)
 [place] EMERGENCY DEPARTMENT AT [facility] Provider Note   CSN: [id number] Arrival date & time: 10/29/23  1704     History  Chief Complaint  Patient presents with   Kristen [name] is a 89 y.o. female.  HPI     This is an 89 year old female with atrial fibrillation on Pradaxa  who presents from home as a level 2 trauma.  Patient reports that she was at her independent living facility when she was trying to vacuum.  She turned on the vacuum cleaner and lost her balance.  She fell.  She is most complaining of back pain, right hip pain and right leg pain.  Reports history of lung and kidney cancer.  Patient was given fentanyl  en route with some improvement.  She does not believe she hit her head but did tell EMS she might have hit her head.  Home Medications Prior to Admission medications   Medication Sig Start Date End Date Taking? Authorizing Provider  albuterol  (VENTOLIN  HFA) 108 (90 Base) MCG/ACT inhaler Inhale 2 puffs into the lungs every 6 (six) hours as needed for wheezing or shortness of breath. Patient not taking: Reported on 12/23/2022 05/20/22   Malachy Comer GAILS, NP  atorvastatin  (LIPITOR) 20 MG tablet Take 20 mg by mouth daily. 09/26/21   [provider]  cyanocobalamin  (,VITAMIN B-12,) 1000 MCG/ML injection Inject 100 mcg into the muscle every 30 (thirty) days. 11/02/19   [provider]  dabigatran  (PRADAXA ) 150 MG CAPS capsule Take 1 capsule (150 mg total) by mouth every 12 (twelve) hours. 11/26/21 02/24/22  [place], Camellia PARAS, DO  diclofenac  Sodium (VOLTAREN ) 1 % GEL Apply 2 g topically daily. For knee pain    [provider]  DULoxetine  (CYMBALTA ) 60 MG capsule Take 60 mg by mouth at bedtime. 12/05/21   [provider]  esomeprazole (NEXIUM) 40 MG capsule Take 40 mg by mouth daily. 09/26/21   [provider]  ferrous sulfate  325 (65 FE) MG tablet Take 1 tablet (325 mg total) by mouth daily. 11/26/21 02/24/22  [place], Camellia PARAS, DO  gabapentin  (NEURONTIN ) 300 MG capsule Take 300 mg by mouth daily. 08/29/21   [provider]  HYDROmorphone  (DILAUDID ) 2 MG tablet Take 2 mg by mouth in the morning and at bedtime. 10/03/21   [provider]  ipratropium (ATROVENT ) 0.06 % nasal spray Place 2 sprays into both nostrils daily. 12/30/22   Cobb, Comer GAILS, NP  losartan  (COZAAR ) 50 MG tablet Take 50 mg by mouth daily. 07/28/21   [provider]  meloxicam (MOBIC) 15 MG tablet Take 15 mg by mouth daily. Patient not taking: Reported on 12/15/2021    [provider]  metoCLOPramide  (REGLAN ) 10 MG tablet Take 1 tablet (10 mg total) by mouth every 6 (six) hours. 08/05/23   Doretha Folks, MD  nebivolol  (BYSTOLIC ) 5 MG tablet Take 5 mg by mouth daily. Patient not taking: Reported on 12/15/2021    [provider]  ondansetron  (ZOFRAN -ODT) 4 MG disintegrating tablet 4mg  ODT q4 hours prn nausea/vomit 08/09/23   Patt Alm Macho, MD  ondansetron  (ZOFRAN -ODT) 4 MG disintegrating tablet 4mg  ODT q6 hours prn nausea/vomit 10/28/23   Patt Alm Macho, MD  primidone  (MYSOLINE ) 50 MG tablet Take 50 mg by mouth at bedtime. 08/26/21   [provider]  sucralfate  (CARAFATE ) 1 g tablet Take 1 tablet (1 g total) by mouth 4 (four) times daily -  with meals and at bedtime. 08/05/23  Doretha Folks, MD  traZODone  (DESYREL ) 100 MG tablet Take 100 mg by mouth at bedtime. 08/21/21   [provider]      Allergies    Penicillins, Tetracyclines & related, and Sulfa antibiotics    Review of Systems   Review of Systems  Respiratory:  Negative for shortness of breath.   Cardiovascular:  Negative for chest pain.  Musculoskeletal:        Right hip pain  All other systems reviewed and are negative.   Physical Exam Updated Vital Signs BP (!) 140/75 (BP Location: Left Arm)   Pulse 93   Temp 98.4 F (36.9 C) (Oral)   Resp 17   LMP  (LMP Unknown)   SpO2 94%  Physical Exam Vitals and  nursing note reviewed.  Constitutional:      Appearance: She is well-developed. She is not ill-appearing.  HENT:     Head: Normocephalic and atraumatic.  Eyes:     Pupils: Pupils are equal, round, and reactive to light.  Cardiovascular:     Rate and Rhythm: Normal rate and regular rhythm.     Heart sounds: Normal heart sounds.  Pulmonary:     Effort: Pulmonary effort is normal. No respiratory distress.  Abdominal:     Palpations: Abdomen is soft.     Tenderness: There is no abdominal tenderness. There is no guarding or rebound.  Musculoskeletal:     Cervical back: Neck supple.     Comments: Tenderness to palpation of the right hip with pain with range of motion, slight foreshortening with DP pulses  Skin:    General: Skin is warm and dry.  Neurological:     Mental Status: She is alert and oriented to person, place, and time.  Psychiatric:        Mood and Affect: Mood normal.     ED Results / Procedures / Treatments   Labs (all labs ordered are listed, but only abnormal results are displayed) Labs Reviewed  COMPREHENSIVE METABOLIC PANEL - Abnormal; Notable for the following components:      Result Value   Sodium 132 (*)    Chloride 95 (*)    Glucose, Bld 136 (*)    Creatinine, Ser 1.20 (*)    GFR, Estimated 44 (*)    All other components within normal limits  CBC - Abnormal; Notable for the following components:   WBC 21.0 (*)    All other components within normal limits  PROTIME-INR - Abnormal; Notable for the following components:   Prothrombin Time 18.7 (*)    INR 1.5 (*)    All other components within normal limits  I-STAT CHEM 8, ED - Abnormal; Notable for the following components:   Sodium 133 (*)    Chloride 96 (*)    Creatinine, Ser 1.20 (*)    Glucose, Bld 131 (*)    Calcium , Ion 1.08 (*)    All other components within normal limits  ETHANOL  URINALYSIS, ROUTINE W REFLEX MICROSCOPIC  CBC WITH DIFFERENTIAL/PLATELET  COMPREHENSIVE METABOLIC PANEL  I-STAT  CG4 LACTIC ACID, ED  SAMPLE TO BLOOD BANK    EKG EKG Interpretation Date/Time:  Friday October 29 2023 17:39:57 EST Ventricular Rate:  78 PR Interval:  172 QRS Duration:  94 QT Interval:  412 QTC Calculation: 470 R Axis:   49  Text Interpretation: Sinus rhythm Supraventricular bigeminy Consider left ventricular hypertrophy Confirmed by Bari Pfeiffer ([id number]) on 10/29/2023 6:21:36 PM  Radiology DG Chest Portable 1 View Result Date: 10/29/2023  CLINICAL DATA:  89 year old female tripped over vacuum cleaner and landed on right hip. Subsequent back pain. EXAM: PORTABLE CHEST 1 VIEW COMPARISON:  CT 10/28/2023 FINDINGS: Redemonstrated airspace opacities in the right upper lobe laterally. Bibasilar atelectasis. The lungs are otherwise clear. No pleural effusion or pneumothorax. Normal cardiomediastinal silhouette. Aortic atherosclerotic calcification. No displaced rib fractures. IMPRESSION: 1. No change from 10/28/2023. Right upper lobe airspace opacity better evaluated on prior CT. Electronically Signed   By: Norman Gatlin M.D.   On: 10/29/2023 19:20   DG Pelvis Portable Result Date: 10/29/2023 CLINICAL DATA:  89 year old female status post fall landing on right hip. Right leg shoulder than left upon arrival. EXAM: PORTABLE PELVIS 1-2 VIEWS; DG HIP (WITH OR WITHOUT PELVIS) 1V PORT RIGHT COMPARISON:  CT 10/28/2023 FINDINGS: Acute mildly impacted subcapital fracture of the right femoral neck. The femoral head remains located in the acetabulum. Contrast from CT performed yesterday within the bladder. IMPRESSION: Acute mildly impacted subcapital fracture of the right femoral neck. Electronically Signed   By: Norman Gatlin M.D.   On: 10/29/2023 19:15   DG Hip Port Unilat W or Wo Pelvis 1 View Right Result Date: 10/29/2023 CLINICAL DATA:  89 year old female status post fall landing on right hip. Right leg shoulder than left upon arrival. EXAM: PORTABLE PELVIS 1-2 VIEWS; DG HIP (WITH OR WITHOUT PELVIS) 1V  PORT RIGHT COMPARISON:  CT 10/28/2023 FINDINGS: Acute mildly impacted subcapital fracture of the right femoral neck. The femoral head remains located in the acetabulum. Contrast from CT performed yesterday within the bladder. IMPRESSION: Acute mildly impacted subcapital fracture of the right femoral neck. Electronically Signed   By: Norman Gatlin M.D.   On: 10/29/2023 19:15   CT CHEST ABDOMEN PELVIS W CONTRAST Result Date: 10/28/2023 CLINICAL DATA:  Weight loss, unintended hx of gastritis and lung mass EXAM: CT CHEST, ABDOMEN, AND PELVIS WITH CONTRAST TECHNIQUE: Multidetector CT imaging of the chest, abdomen and pelvis was performed following the standard protocol during bolus administration of intravenous contrast. RADIATION DOSE REDUCTION: This exam was performed according to the departmental dose-optimization program which includes automated exposure control, adjustment of the mA and/or kV according to patient size and/or use of iterative reconstruction technique. CONTRAST:  55mL OMNIPAQUE  IOHEXOL  350 MG/ML SOLN COMPARISON:  CT chest abdomen pelvis 08/05/2023 FINDINGS: CT CHEST FINDINGS Cardiovascular: Normal heart size. No significant pericardial effusion. The thoracic aorta is normal in caliber. Moderate atherosclerotic plaque of the thoracic aorta. At least 3 vessel coronary artery calcifications. Aortic valve is a calcification. The main pulmonary artery is normal in caliber. No central or proximal segmental pulmonary embolus. Limited evaluation more distally. Mediastinum/Nodes: No enlarged mediastinal, hilar, or axillary lymph nodes. Thyroid  gland, trachea, and esophagus demonstrate no significant findings. Lungs/Pleura: No focal consolidation. Interval increase in size of a subsolid right apical lung mass measuring 3.6 x 2.9 cm (from 3.1 x 2.4 cm). Associated pleural tethering. Interval development of a right lower lobe posterior base subpleural 0.5 x 0.4 cm pulmonary nodule (5:116). Subpleural  micronodule triangular nodes along the fissures suggestive intrapulmonary lymph nodes. Stable left upper lobe subpleural calcified micronodule (100:36). No pleural effusion. No pneumothorax. Musculoskeletal: No chest wall abnormality. No suspicious lytic or blastic osseous lesions. No acute displaced fracture. Diffusely decreased bone density. Multilevel degenerative changes. CT ABDOMEN PELVIS FINDINGS Hepatobiliary: No focal liver abnormality. Status post cholecystectomy. No biliary dilatation. Pancreas: Diffusely atrophic. No focal lesion. Otherwise normal pancreatic contour. No surrounding inflammatory changes. No main pancreatic ductal dilatation. Spleen: Normal in size without  focal abnormality. Adrenals/Urinary Tract: No adrenal nodule bilaterally. Bilateral kidneys enhance symmetrically. There is a 2 cm right renal hypodense lesion with a density of 34 on spilled units. Fluid density lesions likely represent simple renal cysts. Simple renal cysts, in the absence of clinically indicated signs/symptoms, require no independent follow-up. Subcentimeter hypodensities are too small to characterize. No hydronephrosis. No hydroureter. The urinary bladder is unremarkable. Stomach/Bowel: Stomach is within normal limits in the setting of under distension. No evidence of bowel wall thickening or dilatation. Status post appendectomy. Vascular/Lymphatic: No abdominal aorta or iliac aneurysm. Severe atherosclerotic plaque of the aorta and its branches. No abdominal, pelvic, or inguinal lymphadenopathy. Reproductive: Status post hysterectomy. No adnexal masses. Other: No intraperitoneal free fluid. No intraperitoneal free gas. No organized fluid collection. Musculoskeletal: No abdominal wall hernia or abnormality. No suspicious lytic or blastic osseous lesions. No acute displaced fracture. Diffusely decreased bone density. Multilevel severe degenerative changes of the spine. Several vertebral body hemangiomas. IMPRESSION: 1.  Interval increase in size of a subsolid right apical lung mass measuring 3.6 x 2.9 cm. Additional imaging evaluation or consultation with Pulmonology or Thoracic Surgery recommended. 2. Interval development of a right lower lobe posterior base subpleural 0.5 x 0.4 cm pulmonary nodule. 3. Indeterminate 2 cm right renal mass. Underlying malignancy not excluded. When the patient is clinically stable and able to follow directions and hold their breath (preferably as an outpatient) further evaluation with dedicated MRI renal protocol should be considered. 4. Aortic Atherosclerosis (ICD10-I70.0) including at least 3 vessel coronary calcification and aortic valve leaflet calcifications-Kohler late for aortic stenosis. Electronically Signed   By: Morgane  Naveau M.D.   On: 10/28/2023 20:46   DG Chest Port 1 View Result Date: 10/28/2023 CLINICAL DATA:  Several week history of mid chest pain and nausea EXAM: PORTABLE CHEST 1 VIEW COMPARISON:  Chest radiograph dated 05/13/2022 FINDINGS: Overlying leads limit evaluation of the right hemithorax. Normal lung volumes. Irregular, hazy opacity projects over the lateral right upper lung. Left basilar patchy opacity. No pleural effusion or pneumothorax. The heart size and mediastinal contours are within normal limits. No acute osseous abnormality. IMPRESSION: 1. Irregular, hazy opacity projects over the lateral right upper lung, better evaluated on same-day CT chest. 2. Left basilar patchy opacity, likely atelectasis. Electronically Signed   By: Limin  Xu M.D.   On: 10/28/2023 19:26    Procedures Procedures    Medications Ordered in ED Medications  chlorhexidine  (HIBICLENS ) 4 % liquid 4 Application (has no administration in time range)  povidone-iodine  10 % swab 2 Application (has no administration in time range)  ceFAZolin  (ANCEF ) IVPB 2g/100 mL premix (has no administration in time range)  tranexamic acid  (CYKLOKAPRON ) IVPB 1,000 mg (has no administration in time  range)  morphine  (PF) 4 MG/ML injection 4 mg (4 mg Intravenous Given 10/29/23 1906)  ondansetron  (ZOFRAN ) injection 4 mg (4 mg Intravenous Given 10/29/23 1905)    ED Course/ Medical Decision Making/ A&P Clinical Course as of 10/29/23 1933  [place] Oct 29, 2023  1841 Spoke to Dr. Celena, orthopedics.  N.p.o. after midnight.  He will place additional x-rays.  Will plan for medicine admit. [CH]    Clinical Course User Index [CH] Bela Bonaparte, Charmaine FALCON, MD                                 Medical Decision Making Amount and/or Complexity of Data Reviewed Labs: ordered. Radiology: ordered.  Risk Prescription  drug management. Decision regarding hospitalization.   This patient presents to the ED for concern of fall, this involves an extensive number of treatment options, and is a complaint that carries with it a high risk of complications and morbidity.  I considered the following differential and admission for this acute, potentially life threatening condition.  The differential diagnosis includes head injury, long bone injury  MDM:    This is an 89 year old female who presents with concern for fall.  Reports mechanical fall.  She is overall nontoxic and vital signs are reassuring.  She lives in independent living.  She has recently been concerned regarding imaging findings concerning for possible cancer although she has followed up with pulmonology.  She was seen yesterday and had repeat imaging that showed an apical mass of the right lung and a pulmonary nodule.  Today she is in no respiratory distress.  No obvious head injury.  Concern for right hip fracture.  Images reviewed at the bedside and do show a femoral neck fracture.  Discussed with Dr. Celena as above.  Chest x-ray does not show any new findings.  She does have a leukocytosis.  Question acute phase reactant.  Will obtain urinalysis as well.  Will plan for admission to the hospitalist and definitive repair of her right hip fracture.  N.p.o. after  midnight.  (Labs, imaging, consults)  Labs: I Ordered, and personally interpreted labs.  The pertinent results include: CBC, CMP, lactate  Imaging Studies ordered: I ordered imaging studies including chest x-ray, pelvis films I independently visualized and interpreted imaging. I agree with the radiologist interpretation  Additional history obtained from chart review.  External records from outside source obtained and reviewed including recent ED visits  Cardiac Monitoring: The patient was maintained on a cardiac monitor.  If on the cardiac monitor, I personally viewed and interpreted the cardiac monitored which showed an underlying rhythm of: Sinus  Reevaluation: After the interventions noted above, I reevaluated the patient and found that they have :stayed the same  Social Determinants of Health:  lives in independent living  Disposition: Admit  Co morbidities that complicate the patient evaluation  Past Medical History:  Diagnosis Date   Anemia    Arthritis    B12 deficiency    Chronic pain disorder    trigeminal neuralgia   Depression    Dyslipidemia    Hypertension    Seizure disorder (HCC)    decades since last seizure   Tremor    Trigeminal nerve disorder      Medicines Meds ordered this encounter  Medications   chlorhexidine  (HIBICLENS ) 4 % liquid 4 Application   povidone-iodine  10 % swab 2 Application   ceFAZolin  (ANCEF ) IVPB 2g/100 mL premix    Indication::   Surgical Prophylaxis   tranexamic acid  (CYKLOKAPRON ) IVPB 1,000 mg   morphine  (PF) 4 MG/ML injection 4 mg   ondansetron  (ZOFRAN ) injection 4 mg    I have reviewed the patients home medicines and have made adjustments as needed  Problem List / ED Course: Problem List Items Addressed This Visit   None Visit Diagnoses       Closed fracture of neck of right femur, initial encounter (HCC)    -  Primary                   Final Clinical Impression(s) / ED Diagnoses Final diagnoses:   Closed fracture of neck of right femur, initial encounter (HCC)    Rx / DC Orders ED Discharge  Orders     None         Bari Charmaine FALCON, MD 10/29/23 [phone]

## 2023-10-30 ENCOUNTER — Encounter (HOSPITAL_COMMUNITY): Payer: Self-pay | Admitting: Internal Medicine

## 2023-10-30 ENCOUNTER — Encounter (HOSPITAL_COMMUNITY)
Admission: EM | Disposition: A | Discharge status: Skilled Nursing Facility | Payer: Self-pay | Source: Home / Self Care | Attending: Internal Medicine

## 2023-10-30 ENCOUNTER — Other Ambulatory Visit: Payer: Self-pay

## 2023-10-30 ENCOUNTER — Inpatient Hospital Stay (HOSPITAL_COMMUNITY): Payer: Medicare Other | Admitting: Anesthesiology

## 2023-10-30 ENCOUNTER — Inpatient Hospital Stay (HOSPITAL_COMMUNITY): Payer: Medicare Other

## 2023-10-30 ENCOUNTER — Inpatient Hospital Stay (HOSPITAL_COMMUNITY): Payer: Medicare PPO | Admitting: Anesthesiology

## 2023-10-30 DIAGNOSIS — Z96641 Presence of right artificial hip joint: Secondary | ICD-10-CM

## 2023-10-30 DIAGNOSIS — S72001A Fracture of unspecified part of neck of right femur, initial encounter for closed fracture: Secondary | ICD-10-CM

## 2023-10-30 HISTORY — PX: TOTAL HIP ARTHROPLASTY: SHX124

## 2023-10-30 LAB — CBC WITH DIFFERENTIAL/PLATELET
Abs Immature Granulocytes: 0.04 10*3/uL (ref 0.00–0.07)
Basophils Absolute: 0.1 10*3/uL (ref 0.0–0.1)
Basophils Relative: 0 %
Eosinophils Absolute: 0.2 10*3/uL (ref 0.0–0.5)
Eosinophils Relative: 1 %
HCT: 39.1 % (ref 36.0–46.0)
Hemoglobin: 13.4 g/dL (ref 12.0–15.0)
Immature Granulocytes: 0 %
Lymphocytes Relative: 5 %
Lymphs Abs: 0.7 10*3/uL (ref 0.7–4.0)
MCH: 32.3 pg (ref 26.0–34.0)
MCHC: 34.3 g/dL (ref 30.0–36.0)
MCV: 94.2 fL (ref 80.0–100.0)
Monocytes Absolute: 0.4 10*3/uL (ref 0.1–1.0)
Monocytes Relative: 3 %
Neutro Abs: 11 10*3/uL — ABNORMAL HIGH (ref 1.7–7.7)
Neutrophils Relative %: 91 %
Platelets: 203 10*3/uL (ref 150–400)
RBC: 4.15 MIL/uL (ref 3.87–5.11)
RDW: 11.7 % (ref 11.5–15.5)
WBC: 12.3 10*3/uL — ABNORMAL HIGH (ref 4.0–10.5)
nRBC: 0 % (ref 0.0–0.2)

## 2023-10-30 LAB — COMPREHENSIVE METABOLIC PANEL
ALT: 16 U/L (ref 0–44)
AST: 21 U/L (ref 15–41)
Albumin: 3.9 g/dL (ref 3.5–5.0)
Alkaline Phosphatase: 68 U/L (ref 38–126)
Anion gap: 12 (ref 5–15)
BUN: 14 mg/dL (ref 8–23)
CO2: 26 mmol/L (ref 22–32)
Calcium: 9.1 mg/dL (ref 8.9–10.3)
Chloride: 95 mmol/L — ABNORMAL LOW (ref 98–111)
Creatinine, Ser: 1.2 mg/dL — ABNORMAL HIGH (ref 0.44–1.00)
GFR, Estimated: 44 mL/min — ABNORMAL LOW (ref >60)
Glucose, Bld: 152 mg/dL — ABNORMAL HIGH (ref 70–99)
Potassium: 4.4 mmol/L (ref 3.5–5.1)
Sodium: 133 mmol/L — ABNORMAL LOW (ref 135–145)
Total Bilirubin: 1.2 mg/dL (ref 0.0–1.2)
Total Protein: 6.6 g/dL (ref 6.5–8.1)

## 2023-10-30 LAB — TYPE AND SCREEN
ABO/RH(D): A NEG
Antibody Screen: NEGATIVE

## 2023-10-30 LAB — SURGICAL PCR SCREEN
MRSA, PCR: NEGATIVE
Staphylococcus aureus: NEGATIVE

## 2023-10-30 LAB — ABO/RH: ABO/RH(D): A NEG

## 2023-10-30 SURGERY — ARTHROPLASTY, HIP, TOTAL,POSTERIOR APPROACH
Anesthesia: General | Site: Hip | Laterality: Right

## 2023-10-30 MED ORDER — TRANEXAMIC ACID-NACL 1000-0.7 MG/100ML-% IV SOLN
INTRAVENOUS | Status: AC
  Filled 2023-10-30: qty 100

## 2023-10-30 MED ORDER — ORAL CARE MOUTH RINSE: 15.0000 mL | Freq: Once | OROMUCOSAL | Status: DC

## 2023-10-30 MED ORDER — APIXABAN 5 MG PO TABS
5.0000 mg | ORAL_TABLET | Freq: Two times a day (BID) | ORAL | Status: DC
  Administered 2023-10-31 – 2023-11-04 (×9): 5 mg via ORAL
  Filled 2023-10-30 (×9): qty 1

## 2023-10-30 MED ORDER — ACETAMINOPHEN 325 MG PO TABS
650.0000 mg | ORAL_TABLET | Freq: Four times a day (QID) | ORAL | Status: DC
  Filled 2023-10-30: qty 2

## 2023-10-30 MED ORDER — PROPOFOL 10 MG/ML IV BOLUS
INTRAVENOUS | Status: AC
  Filled 2023-10-30: qty 20

## 2023-10-30 MED ORDER — DEXAMETHASONE SODIUM PHOSPHATE 10 MG/ML IJ SOLN
INTRAMUSCULAR | Status: DC | PRN
  Administered 2023-10-30: 10 mg via INTRAVENOUS

## 2023-10-30 MED ORDER — ROCURONIUM BROMIDE 10 MG/ML (PF) SYRINGE
PREFILLED_SYRINGE | INTRAVENOUS | Status: DC | PRN
  Administered 2023-10-30: 50 mg via INTRAVENOUS

## 2023-10-30 MED ORDER — DIPHENHYDRAMINE HCL 12.5 MG/5ML PO ELIX
12.5000 mg | ORAL_SOLUTION | ORAL | Status: DC | PRN
  Administered 2023-11-01: 25 mg via ORAL
  Filled 2023-10-30: qty 10

## 2023-10-30 MED ORDER — SENNA 8.6 MG PO TABS
2.0000 | ORAL_TABLET | Freq: Every day | ORAL | Status: DC
  Administered 2023-10-30 – 2023-11-01 (×3): 17.2 mg via ORAL
  Filled 2023-10-30 (×4): qty 2

## 2023-10-30 MED ORDER — LIDOCAINE 2% (20 MG/ML) 5 ML SYRINGE
INTRAMUSCULAR | Status: DC | PRN
  Administered 2023-10-30: 50 mg via INTRAVENOUS

## 2023-10-30 MED ORDER — METHOCARBAMOL 1000 MG/10ML IJ SOLN
500.0000 mg | Freq: Four times a day (QID) | INTRAMUSCULAR | Status: DC | PRN
  Administered 2023-10-30: 500 mg via INTRAVENOUS

## 2023-10-30 MED ORDER — DEXAMETHASONE SODIUM PHOSPHATE 10 MG/ML IJ SOLN
10.0000 mg | Freq: Once | INTRAMUSCULAR | Status: AC
Start: 2023-10-31 — End: 2023-10-31
  Administered 2023-10-31: 10 mg via INTRAVENOUS
  Filled 2023-10-30: qty 1

## 2023-10-30 MED ORDER — PROPOFOL 10 MG/ML IV BOLUS
INTRAVENOUS | Status: DC | PRN
  Administered 2023-10-30: 100 mg via INTRAVENOUS

## 2023-10-30 MED ORDER — PHENYLEPHRINE HCL-NACL 20-0.9 MG/250ML-% IV SOLN
INTRAVENOUS | Status: DC | PRN
  Administered 2023-10-30: 50 ug/min via INTRAVENOUS

## 2023-10-30 MED ORDER — ROCURONIUM BROMIDE 10 MG/ML (PF) SYRINGE
PREFILLED_SYRINGE | INTRAVENOUS | Status: AC
  Filled 2023-10-30: qty 10

## 2023-10-30 MED ORDER — ONDANSETRON HCL 4 MG/2ML IJ SOLN
INTRAMUSCULAR | Status: DC | PRN
  Administered 2023-10-30: 4 mg via INTRAVENOUS

## 2023-10-30 MED ORDER — DEXAMETHASONE SODIUM PHOSPHATE 10 MG/ML IJ SOLN
INTRAMUSCULAR | Status: AC
  Filled 2023-10-30: qty 1

## 2023-10-30 MED ORDER — FENTANYL CITRATE (PF) 100 MCG/2ML IJ SOLN
25.0000 ug | INTRAMUSCULAR | Status: DC | PRN
Start: 2023-10-30 — End: 2023-10-30
  Administered 2023-10-30 (×2): 25 ug via INTRAVENOUS

## 2023-10-30 MED ORDER — BISACODYL 10 MG RE SUPP: 10.0000 mg | Freq: Every day | RECTAL | Status: DC | PRN

## 2023-10-30 MED ORDER — VASOPRESSIN 20 UNIT/ML IV SOLN
INTRAVENOUS | Status: AC
  Filled 2023-10-30: qty 1

## 2023-10-30 MED ORDER — OXYCODONE HCL 5 MG PO TABS
2.5000 mg | ORAL_TABLET | ORAL | Status: DC | PRN
Start: 2023-10-30 — End: 2023-11-04
  Administered 2023-10-30 (×2): 5 mg via ORAL
  Filled 2023-10-30 (×2): qty 1

## 2023-10-30 MED ORDER — HYDROMORPHONE HCL 2 MG PO TABS
2.0000 mg | ORAL_TABLET | Freq: Three times a day (TID) | ORAL | Status: DC
  Administered 2023-10-30 – 2023-11-04 (×15): 2 mg via ORAL
  Filled 2023-10-30 (×15): qty 1

## 2023-10-30 MED ORDER — ACETAMINOPHEN 500 MG PO TABS
1000.0000 mg | ORAL_TABLET | Freq: Three times a day (TID) | ORAL | Status: DC
Start: 2023-10-30 — End: 2023-11-04
  Administered 2023-10-30 – 2023-11-04 (×15): 1000 mg via ORAL
  Filled 2023-10-30 (×15): qty 2

## 2023-10-30 MED ORDER — VASOPRESSIN 20 UNIT/ML IV SOLN
INTRAVENOUS | Status: DC | PRN
  Administered 2023-10-30: 1 [IU] via INTRAVENOUS
  Administered 2023-10-30: 2 [IU] via INTRAVENOUS

## 2023-10-30 MED ORDER — TRANEXAMIC ACID-NACL 1000-0.7 MG/100ML-% IV SOLN
1000.0000 mg | Freq: Once | INTRAVENOUS | Status: AC
Start: 2023-10-30 — End: 2023-10-30
  Administered 2023-10-30: 1000 mg via INTRAVENOUS
  Filled 2023-10-30: qty 100

## 2023-10-30 MED ORDER — PHENOL 1.4 % MT LIQD: 1.0000 | OROMUCOSAL | Status: DC | PRN

## 2023-10-30 MED ORDER — OXYCODONE HCL 5 MG PO TABS: 5.0000 mg | ORAL_TABLET | ORAL | Status: DC | PRN

## 2023-10-30 MED ORDER — FENTANYL CITRATE (PF) 250 MCG/5ML IJ SOLN
INTRAMUSCULAR | Status: AC
  Filled 2023-10-30: qty 5

## 2023-10-30 MED ORDER — OXYCODONE HCL 5 MG PO TABS
ORAL_TABLET | ORAL | Status: AC
  Filled 2023-10-30: qty 1

## 2023-10-30 MED ORDER — ALBUMIN HUMAN 5 % IV SOLN: INTRAVENOUS | Status: DC | PRN

## 2023-10-30 MED ORDER — POLYETHYLENE GLYCOL 3350 17 G PO PACK
17.0000 g | PACK | Freq: Two times a day (BID) | ORAL | Status: DC
  Administered 2023-10-30 – 2023-11-01 (×6): 17 g via ORAL
  Filled 2023-10-30 (×9): qty 1

## 2023-10-30 MED ORDER — LIDOCAINE 2% (20 MG/ML) 5 ML SYRINGE
INTRAMUSCULAR | Status: AC
  Filled 2023-10-30: qty 5

## 2023-10-30 MED ORDER — 0.9 % SODIUM CHLORIDE (POUR BTL) OPTIME
TOPICAL | Status: DC | PRN
  Administered 2023-10-30: 1000 mL

## 2023-10-30 MED ORDER — ONDANSETRON HCL 4 MG/2ML IJ SOLN: 4.0000 mg | Freq: Once | INTRAMUSCULAR | Status: DC | PRN

## 2023-10-30 MED ORDER — EPHEDRINE SULFATE-NACL 50-0.9 MG/10ML-% IV SOSY
PREFILLED_SYRINGE | INTRAVENOUS | Status: DC | PRN
  Administered 2023-10-30: 5 mg via INTRAVENOUS
  Administered 2023-10-30: 10 mg via INTRAVENOUS

## 2023-10-30 MED ORDER — METOCLOPRAMIDE HCL 5 MG PO TABS: 5.0000 mg | ORAL_TABLET | Freq: Three times a day (TID) | ORAL | Status: DC | PRN

## 2023-10-30 MED ORDER — FENTANYL CITRATE (PF) 250 MCG/5ML IJ SOLN
INTRAMUSCULAR | Status: DC | PRN
  Administered 2023-10-30: 50 ug via INTRAVENOUS

## 2023-10-30 MED ORDER — LACTATED RINGERS IV SOLN: INTRAVENOUS | Status: DC

## 2023-10-30 MED ORDER — ALUM & MAG HYDROXIDE-SIMETH 200-200-20 MG/5ML PO SUSP: 30.0000 mL | ORAL | Status: DC | PRN

## 2023-10-30 MED ORDER — HYDROMORPHONE HCL 1 MG/ML IJ SOLN: 0.5000 mg | INTRAMUSCULAR | Status: DC | PRN

## 2023-10-30 MED ORDER — MENTHOL 3 MG MT LOZG: 1.0000 | LOZENGE | OROMUCOSAL | Status: DC | PRN

## 2023-10-30 MED ORDER — METHOCARBAMOL 500 MG PO TABS: 500.0000 mg | ORAL_TABLET | Freq: Four times a day (QID) | ORAL | Status: DC | PRN

## 2023-10-30 MED ORDER — CEFAZOLIN SODIUM-DEXTROSE 2-4 GM/100ML-% IV SOLN
INTRAVENOUS | Status: AC
  Filled 2023-10-30: qty 100

## 2023-10-30 MED ORDER — PHENYLEPHRINE 80 MCG/ML (10ML) SYRINGE FOR IV PUSH (FOR BLOOD PRESSURE SUPPORT)
PREFILLED_SYRINGE | INTRAVENOUS | Status: AC
  Filled 2023-10-30: qty 10

## 2023-10-30 MED ORDER — METOCLOPRAMIDE HCL 5 MG/ML IJ SOLN: 5.0000 mg | Freq: Three times a day (TID) | INTRAMUSCULAR | Status: DC | PRN

## 2023-10-30 MED ORDER — SODIUM CHLORIDE 0.9% FLUSH: 3.0000 mL | INTRAVENOUS | Status: DC | PRN

## 2023-10-30 MED ORDER — ACETAMINOPHEN 10 MG/ML IV SOLN
INTRAVENOUS | Status: DC | PRN
  Administered 2023-10-30: 1000 mg via INTRAVENOUS

## 2023-10-30 MED ORDER — FENTANYL CITRATE (PF) 100 MCG/2ML IJ SOLN
INTRAMUSCULAR | Status: AC
  Filled 2023-10-30: qty 2

## 2023-10-30 MED ORDER — ONDANSETRON HCL 4 MG/2ML IJ SOLN
INTRAMUSCULAR | Status: AC
  Filled 2023-10-30: qty 2

## 2023-10-30 MED ORDER — CHLORHEXIDINE GLUCONATE 0.12 % MT SOLN: 15.0000 mL | Freq: Once | OROMUCOSAL | Status: DC

## 2023-10-30 MED ORDER — CHLORHEXIDINE GLUCONATE 0.12 % MT SOLN
OROMUCOSAL | Status: AC
  Administered 2023-10-30: 15 mL
  Filled 2023-10-30: qty 15

## 2023-10-30 MED ORDER — SUGAMMADEX SODIUM 200 MG/2ML IV SOLN
INTRAVENOUS | Status: DC | PRN
  Administered 2023-10-30: 200 mg via INTRAVENOUS

## 2023-10-30 MED ORDER — SODIUM CHLORIDE 0.9% FLUSH
3.0000 mL | Freq: Two times a day (BID) | INTRAVENOUS | Status: DC
  Administered 2023-10-30 – 2023-11-01 (×5): 10 mL via INTRAVENOUS
  Administered 2023-11-02 – 2023-11-04 (×3): 3 mL via INTRAVENOUS

## 2023-10-30 MED ORDER — PHENYLEPHRINE 80 MCG/ML (10ML) SYRINGE FOR IV PUSH (FOR BLOOD PRESSURE SUPPORT)
PREFILLED_SYRINGE | INTRAVENOUS | Status: DC | PRN
  Administered 2023-10-30: 160 ug via INTRAVENOUS
  Administered 2023-10-30: 80 ug via INTRAVENOUS
  Administered 2023-10-30: 160 ug via INTRAVENOUS

## 2023-10-30 MED ORDER — CEFAZOLIN SODIUM-DEXTROSE 2-4 GM/100ML-% IV SOLN
2.0000 g | Freq: Four times a day (QID) | INTRAVENOUS | Status: AC
  Administered 2023-10-30 (×2): 2 g via INTRAVENOUS
  Filled 2023-10-30 (×2): qty 100

## 2023-10-30 MED ORDER — METHOCARBAMOL 1000 MG/10ML IJ SOLN
INTRAMUSCULAR | Status: AC
  Filled 2023-10-30: qty 10

## 2023-10-30 SURGICAL SUPPLY — 45 items
BAG COUNTER SPONGE SURGICOUNT (BAG) ×1 IMPLANT
BLADE SAW SGTL 73X25 THK (BLADE) IMPLANT
CELLS DAT CNTRL 66122 CELL SVR (MISCELLANEOUS) IMPLANT
COVER SURGICAL LIGHT HANDLE (MISCELLANEOUS) ×1 IMPLANT
CUP ACETBLR 48 OD SECTOR II (Hips) IMPLANT
DERMABOND ADVANCED .7 DNX12 (GAUZE/BANDAGES/DRESSINGS) ×1 IMPLANT
DRAPE C-ARM 42X72 X-RAY (DRAPES) ×1 IMPLANT
DRAPE IMP U-DRAPE 54X76 (DRAPES) ×1 IMPLANT
DRAPE STERI IOBAN 125X83 (DRAPES) ×1 IMPLANT
DRAPE U-SHAPE 47X51 STRL (DRAPES) ×3 IMPLANT
DRSG AQUACEL AG ADV 3.5X10 (GAUZE/BANDAGES/DRESSINGS) ×1 IMPLANT
DRSG TEGADERM 4X4.75 (GAUZE/BANDAGES/DRESSINGS) ×1 IMPLANT
DURAPREP 26ML APPLICATOR (WOUND CARE) ×1 IMPLANT
ELECT REM PT RETURN 9FT ADLT (ELECTROSURGICAL) ×1 IMPLANT
ELECTRODE REM PT RTRN 9FT ADLT (ELECTROSURGICAL) ×1 IMPLANT
EVACUATOR 1/8 PVC DRAIN (DRAIN) ×1 IMPLANT
FACESHIELD WRAPAROUND (MASK) ×2 IMPLANT
FACESHIELD WRAPAROUND OR TEAM (MASK) IMPLANT
GLOVE BIOGEL PI IND STRL 7.5 (GLOVE) ×3 IMPLANT
GLOVE ORTHO TXT STRL SZ7.5 (GLOVE) ×2 IMPLANT
GLOVE SURG ENC MOIS LTX SZ6 (GLOVE) ×1 IMPLANT
GLOVE SURG UNDER POLY LF SZ6.5 (GLOVE) ×2 IMPLANT
GOWN BRE IMP SLV AUR LG STRL (GOWN DISPOSABLE) ×1 IMPLANT
GOWN STRL REUS W/ TWL LRG LVL3 (GOWN DISPOSABLE) ×2 IMPLANT
HEAD FEM STD 32X+5 STRL (Hips) IMPLANT
KIT BASIN OR (CUSTOM PROCEDURE TRAY) ×1 IMPLANT
KIT TURNOVER KIT B (KITS) ×1 IMPLANT
MANIFOLD NEPTUNE II (INSTRUMENTS) ×1 IMPLANT
NS IRRIG 1000ML POUR BTL (IV SOLUTION) ×1 IMPLANT
PACK TOTAL JOINT (CUSTOM PROCEDURE TRAY) ×1 IMPLANT
PACK UNIVERSAL I (CUSTOM PROCEDURE TRAY) ×1 IMPLANT
PAD ARMBOARD 7.5X6 YLW CONV (MISCELLANEOUS) ×2 IMPLANT
PINN ALTRX NEUT ID X OD 32X48 IMPLANT
RETRACTOR WND ALEXIS 18 MED (MISCELLANEOUS) ×1 IMPLANT
RTRCTR WOUND ALEXIS 18CM MED (MISCELLANEOUS) IMPLANT
SCREW 6.5MMX25MM (Screw) IMPLANT
STAPLER VISISTAT 35W (STAPLE) ×1 IMPLANT
STEM FEMORAL SZ6 HIGH ACTIS (Stem) IMPLANT
SUT VIC AB 1 CTX36XBRD ANBCTR (SUTURE) IMPLANT
SUT VIC AB 2-0 CT2 27 (SUTURE) IMPLANT
SUT VLOC 180 0 24IN GS25 (SUTURE) ×1 IMPLANT
TOWEL GREEN STERILE (TOWEL DISPOSABLE) ×1 IMPLANT
TRAY CATH INTERMITTENT SS 16FR (CATHETERS) IMPLANT
TUBE SUCTION HIGH CAP CLEAR NV (SUCTIONS) IMPLANT
WATER STERILE IRR 1000ML POUR (IV SOLUTION) ×3 IMPLANT

## 2023-10-30 NOTE — Discharge Instructions (Signed)

## 2023-10-30 NOTE — Anesthesia Preprocedure Evaluation (Addendum)
 Anesthesia Evaluation  Patient identified by MRN, date of birth, ID band Patient awake    Reviewed: Allergy & Precautions, NPO status , Patient's Chart, lab work & pertinent test results  Airway Mallampati: II  TM Distance: >3 FB Neck ROM: Full    Dental  (+) Teeth Intact, Dental Advisory Given   Pulmonary neg pulmonary ROS   Pulmonary exam normal breath sounds clear to auscultation       Cardiovascular hypertension, Pt. on medications Normal cardiovascular exam Rhythm:Regular Rate:Normal     Neuro/Psych Seizures -, Well Controlled,  PSYCHIATRIC DISORDERS  Depression     Neuromuscular disease    GI/Hepatic negative GI ROS, Neg liver ROS,,,  Endo/Other  negative endocrine ROS    Renal/GU Renal InsufficiencyRenal disease     Musculoskeletal  (+) Arthritis ,   RIGHT FEMORAL NECK FRACTURE   Abdominal   Peds  Hematology  (+) Blood dyscrasia (Eliquis )   Anesthesia Other Findings Day of surgery medications reviewed with the patient.  Reproductive/Obstetrics                             Anesthesia Physical Anesthesia Plan  ASA: 3  Anesthesia Plan: General   Post-op Pain Management: Ofirmev  IV (intra-op)*   Induction: Intravenous  PONV Risk Score and Plan: 3 and Dexamethasone  and Ondansetron   Airway Management Planned: Oral ETT  Additional Equipment:   Intra-op Plan:   Post-operative Plan: Extubation in OR  Informed Consent: I have reviewed the patients History and Physical, chart, labs and discussed the procedure including the risks, benefits and alternatives for the proposed anesthesia with the patient or authorized representative who has indicated his/her understanding and acceptance.   Patient has DNR.  Discussed DNR with power of attorney and Suspend DNR.   Dental advisory given and Consent reviewed with POA  Plan Discussed with: CRNA  Anesthesia Plan Comments: (Phone consent  with patient's daughter)        Anesthesia Quick Evaluation

## 2023-10-30 NOTE — Interval H&P Note (Signed)
 History and Physical Interval Note:  10/30/2023 8:02 AM  Kristen [name]  has presented today for surgery, with the diagnosis of RIGHT FEMORAL NECK FRACTURE.  The various methods of treatment have been discussed with the patient and family. After consideration of risks, benefits and other options for treatment, the patient has consented to  Procedure(s): TOTAL HIP ARTHROPLASTY ANTERIOR APPROACH (Right) as a surgical intervention.  The patient's history has been reviewed, patient examined, no change in status, stable for surgery.  I have reviewed the patient's chart and labs.  Questions were answered to the patient's satisfaction.     Kristen [name]

## 2023-10-30 NOTE — Consult Note (Signed)
 Reason for Consult:right hip fracture Referring Physician: Arlice, MD (Hospitalist)  Kristen [name] is an 89 y.o. female.  HPI: Kristen [name] is a 89 y.o. female with medical history significant of HTN, PAH, PE in 2023 now on chronic eliquis .   Pt with diagnosis on imaging recently of new possible lung CA (enlarging now 3.6cm RUL mass), may or may not have renal CA as well (2cm renal mass).  10lb wt loss recently and generalized weakness are her associated symptoms.   Today pt vacuming at her ILF, tripped, had mechanical fall, injuring her R hip.  Severe R hip pain and inability to bear wt.  In to ED.  Past Medical History:  Diagnosis Date   Anemia    Arthritis    B12 deficiency    Chronic pain disorder    trigeminal neuralgia   Depression    Dyslipidemia    Hypertension    Seizure disorder (HCC)    decades since last seizure   Tremor    Trigeminal nerve disorder     Past Surgical History:  Procedure Laterality Date   ABDOMINAL HYSTERECTOMY     BLADDER REPAIR     tack   CATARACT EXTRACTION     CHOLECYSTECTOMY     gamma knife  2004   NOSE SURGERY     for fracture   SHOULDER ARTHROSCOPY DISTAL CLAVICLE EXCISION AND OPEN ROTATOR CUFF REPAIR Left    rotary cuff repair   TONSILLECTOMY     TOTAL KNEE ARTHROPLASTY Bilateral    UMBILICAL HERNIA REPAIR      History reviewed. No pertinent family history.  Social History:  reports that she has never smoked. She has never been exposed to tobacco smoke. She has never used smokeless tobacco. She reports that she does not currently use alcohol. She reports that she does not use drugs.  Allergies:  Allergies  Allergen Reactions   Penicillins Hives and Rash    Other reaction(s): Other unknown    Tetracyclines & Related Anaphylaxis    Other reaction(s): Other unknown    Sulfa Antibiotics Hives and Other (See Comments)    unknown    Medications: I have reviewed the patient's current medications. Scheduled:  [MAR Hold]  atorvastatin   20 mg Oral Daily   chlorhexidine   60 mL Topical Once   [MAR Hold] gabapentin   300 mg Oral BID   [MAR Hold] pantoprazole   80 mg Oral Q1200   povidone-iodine   2 Application Topical Once   [MAR Hold] traZODone   100 mg Oral QHS    Results for orders placed or performed during the hospital encounter of 10/29/23 (from the past 24 hours)  Comprehensive metabolic panel     Status: Abnormal   Collection Time: 10/29/23  5:14 PM  Result Value Ref Range   Sodium 132 (L) 135 - 145 mmol/L   Potassium 4.0 3.5 - 5.1 mmol/L   Chloride 95 (L) 98 - 111 mmol/L   CO2 24 22 - 32 mmol/L   Glucose, Bld 136 (H) 70 - 99 mg/dL   BUN 19 8 - 23 mg/dL   Creatinine, Ser 8.79 (H) 0.44 - 1.00 mg/dL   Calcium  9.4 8.9 - 10.3 mg/dL   Total Protein 7.4 6.5 - 8.1 g/dL   Albumin  4.5 3.5 - 5.0 g/dL   AST 24 15 - 41 U/L   ALT 20 0 - 44 U/L   Alkaline Phosphatase 78 38 - 126 U/L   Total Bilirubin 0.9 0.0 - 1.2 mg/dL   GFR,  Estimated 44 (L) >60 mL/min   Anion gap 13 5 - 15  CBC     Status: Abnormal   Collection Time: 10/29/23  5:14 PM  Result Value Ref Range   WBC 21.0 (H) 4.0 - 10.5 K/uL   RBC 4.30 3.87 - 5.11 MIL/uL   Hemoglobin 13.7 12.0 - 15.0 g/dL   HCT 60.0 63.9 - 53.9 %   MCV 92.8 80.0 - 100.0 fL   MCH 31.9 26.0 - 34.0 pg   MCHC 34.3 30.0 - 36.0 g/dL   RDW 88.3 88.4 - 84.4 %   Platelets 259 150 - 400 K/uL   nRBC 0.0 0.0 - 0.2 %  Protime-INR     Status: Abnormal   Collection Time: 10/29/23  5:14 PM  Result Value Ref Range   Prothrombin Time 18.7 (H) 11.4 - 15.2 seconds   INR 1.5 (H) 0.8 - 1.2  Ethanol     Status: None   Collection Time: 10/29/23  5:30 PM  Result Value Ref Range   Alcohol, Ethyl (B) <10 <10 mg/dL  Sample to Blood Bank     Status: None   Collection Time: 10/29/23  5:37 PM  Result Value Ref Range   Blood Bank Specimen SAMPLE AVAILABLE FOR TESTING    Sample Expiration      11/01/2023,2359 Performed at [facility], [address], [place], [place] [address]    Type and screen [facility]     Status: None   Collection Time: 10/29/23  5:37 PM  Result Value Ref Range   ABO/RH(D) A NEG    Antibody Screen NEG    Sample Expiration      11/01/2023,2359 Performed at [facility], [address], [place], [place] [address]   I-Stat Chem 8, ED     Status: Abnormal   Collection Time: 10/29/23  5:45 PM  Result Value Ref Range   Sodium 133 (L) 135 - 145 mmol/L   Potassium 4.1 3.5 - 5.1 mmol/L   Chloride 96 (L) 98 - 111 mmol/L   BUN 20 8 - 23 mg/dL   Creatinine, Ser 8.79 (H) 0.44 - 1.00 mg/dL   Glucose, Bld 868 (H) 70 - 99 mg/dL   Calcium , Ion 1.08 (L) 1.15 - 1.40 mmol/L   TCO2 24 22 - 32 mmol/L   Hemoglobin 13.9 12.0 - 15.0 g/dL   HCT 58.9 63.9 - 53.9 %  I-Stat Lactic Acid, ED     Status: None   Collection Time: 10/29/23  5:45 PM  Result Value Ref Range   Lactic Acid, Venous 1.2 0.5 - 1.9 mmol/L  Urinalysis, Routine w reflex microscopic -Urine, Catheterized     Status: Abnormal   Collection Time: 10/29/23  9:10 PM  Result Value Ref Range   Color, Urine YELLOW YELLOW   APPearance CLEAR CLEAR   Specific Gravity, Urine 1.032 (H) 1.005 - 1.030   pH 5.0 5.0 - 8.0   Glucose, UA NEGATIVE NEGATIVE mg/dL   Hgb urine dipstick NEGATIVE NEGATIVE   Bilirubin Urine NEGATIVE NEGATIVE   Ketones, ur NEGATIVE NEGATIVE mg/dL   Protein, ur 30 (A) NEGATIVE mg/dL   Nitrite NEGATIVE NEGATIVE   Leukocytes,Ua SMALL (A) NEGATIVE   RBC / HPF 0-5 0 - 5 RBC/hpf   WBC, UA 0-5 0 - 5 WBC/hpf   Bacteria, UA NONE SEEN NONE SEEN   Squamous Epithelial / HPF 0-5 0 - 5 /HPF  CBC WITH DIFFERENTIAL     Status: Abnormal  Collection Time: 10/30/23  5:05 AM  Result Value Ref Range   WBC 12.3 (H) 4.0 - 10.5 K/uL   RBC 4.15 3.87 - 5.11 MIL/uL   Hemoglobin 13.4 12.0 - 15.0 g/dL   HCT 60.8 63.9 - 53.9 %   MCV 94.2 80.0 - 100.0 fL   MCH 32.3 26.0 - 34.0 pg   MCHC 34.3 30.0 - 36.0 g/dL   RDW 88.2 88.4 - 84.4 %   Platelets 203 150 - 400 K/uL   nRBC 0.0  0.0 - 0.2 %   Neutrophils Relative % 91 %   Neutro Abs 11.0 (H) 1.7 - 7.7 K/uL   Lymphocytes Relative 5 %   Lymphs Abs 0.7 0.7 - 4.0 K/uL   Monocytes Relative 3 %   Monocytes Absolute 0.4 0.1 - 1.0 K/uL   Eosinophils Relative 1 %   Eosinophils Absolute 0.2 0.0 - 0.5 K/uL   Basophils Relative 0 %   Basophils Absolute 0.1 0.0 - 0.1 K/uL   Immature Granulocytes 0 %   Abs Immature Granulocytes 0.04 0.00 - 0.07 K/uL  Comprehensive metabolic panel     Status: Abnormal   Collection Time: 10/30/23  5:05 AM  Result Value Ref Range   Sodium 133 (L) 135 - 145 mmol/L   Potassium 4.4 3.5 - 5.1 mmol/L   Chloride 95 (L) 98 - 111 mmol/L   CO2 26 22 - 32 mmol/L   Glucose, Bld 152 (H) 70 - 99 mg/dL   BUN 14 8 - 23 mg/dL   Creatinine, Ser 8.79 (H) 0.44 - 1.00 mg/dL   Calcium  9.1 8.9 - 10.3 mg/dL   Total Protein 6.6 6.5 - 8.1 g/dL   Albumin  3.9 3.5 - 5.0 g/dL   AST 21 15 - 41 U/L   ALT 16 0 - 44 U/L   Alkaline Phosphatase 68 38 - 126 U/L   Total Bilirubin 1.2 0.0 - 1.2 mg/dL   GFR, Estimated 44 (L) >60 mL/min   Anion gap 12 5 - 15     X-ray: CLINICAL DATA:  89 year old female status post fall landing on right hip. Right leg shoulder than left upon arrival.   EXAM: PORTABLE PELVIS 1-2 VIEWS; DG HIP (WITH OR WITHOUT PELVIS) 1V PORT RIGHT   COMPARISON:  CT 10/28/2023   FINDINGS: Acute mildly impacted subcapital fracture of the right femoral neck. The femoral head remains located in the acetabulum. Contrast from CT performed yesterday within the bladder.   IMPRESSION: Acute mildly impacted subcapital fracture of the right femoral neck.     Electronically Signed   By: Norman Gatlin M.D.  ROS: As per HPI  Blood pressure (!) 117/52, pulse 76, temperature 97.9 F (36.6 C), temperature source Oral, resp. rate 19, SpO2 96%.  Physical Exam: Constitutional:      Appearance: She is well-developed. She is not ill-appearing.  HENT:     Head: Normocephalic and atraumatic.  Eyes:      Pupils: Pupils are equal, round, and reactive to light.  Cardiovascular:     Rate and Rhythm: Normal rate and regular rhythm.     Heart sounds: Normal heart sounds.  Pulmonary:     Effort: Pulmonary effort is normal. No respiratory distress.  Abdominal:     Palpations: Abdomen is soft.     Tenderness: There is no abdominal tenderness. There is no guarding or rebound.  Musculoskeletal:     Cervical back: Neck supple.     Comments: Tenderness to palpation of the right hip  with pain with range of motion, slight foreshortening with DP pulses, RLE shortened and externally rotated Skin:    General: Skin is warm and dry.  Neurological:     Mental Status: She is alert and oriented to person, place, and time.  Psychiatric:        Mood and Affect: Mood normal.   Assessment/Plan: Right hip femoral neck fracture  Plan: Given the injury to her hip and relative independent living environment I have recommended treating this with a total hip replacement I have spoken with her daughter about proceeding with surgery iin setting of Eliquis  use as well as the planned procedure rationale Reviewed post op course and expectations NPO Consent on chart To OR this morning for right THR  Donnice JONETTA Car 10/30/2023, 7:56 AM

## 2023-10-30 NOTE — Anesthesia Postprocedure Evaluation (Signed)
 Anesthesia Post Note  Patient: Kristen [name]  Procedure(s) Performed: TOTAL HIP ARTHROPLASTY ANTERIOR APPROACH (Right: Hip)     Patient location during evaluation: PACU Anesthesia Type: General Level of consciousness: awake and alert Pain management: pain level controlled Vital Signs Assessment: post-procedure vital signs reviewed and stable Respiratory status: spontaneous breathing, nonlabored ventilation, respiratory function stable and patient connected to nasal cannula oxygen Cardiovascular status: blood pressure returned to baseline and stable Postop Assessment: no apparent nausea or vomiting Anesthetic complications: no   There were no known notable events for this encounter.  Last Vitals:    Last Pain:                 Garnette FORBES Skillern

## 2023-10-30 NOTE — Progress Notes (Addendum)
 PROGRESS NOTE  Kristen [name]  DOB: 1933/09/07  PCP: Kristen [name] LABOR, MD FMW:[id number]  DOA: 10/29/2023  LOS: 1 day  Hospital Day: 2  Brief narrative: Kristen [name] is a 89 y.o. female with PMH significant for HTN, HLD, pulmonary hypertension, PE 2023 now on chronic Eliquis ,    Patient lives in an ILF. 1/3, patient tripped, had a mechanical fall and sustained severe pain ability to bear weight on her right hip.  Of note, patient was seen in the ED the day prior on 1/2 for generalized weakness and 10 pound weight loss.  CT abdomen and pelvis was obtained which showed an enlarging right apical lung mass and right renal mass.  She was discharged to follow-up with her pulmonologist.     In the ED, Blood pressure to 200s Labs with WBC count 21,000.  Sodium 132, creatinine 1.2, blood alcohol not elevated X-ray showed acute mildly impacted subcapital fracture of the right femoral neck.  EDP discussed with orthopedics Dr. Celena Recommended surgical fixation  Admitted to [facility]  Subjective: Patient was seen and examined this morning after surgery Alert, awake, propped up in bed.  Not in distress.  Pain controlled. Slow to respond.  daughter-in-law and son at bedside  Chart reviewed Overnight, no fever, blood pressure in 150s WBC count down to 12.3, sodium 133  Assessment and plan: Closed right hip fracture Secondary to mechanical fall Imaging as above Per orthopedics, tentative plan of surgical fixation today Pain regimen -scheduled Tylenol , as needed oxycodone , Dilaudid .  Per family, patient is on scheduled Dilaudid  2 mg oral at home.  Resume it as well Bowel regimen -as needed DVT prophylaxis -resume Eliquis  when okay with orthopedics.   Leukocytosis No evidence of infection.  Leukocytosis likely due to stress of trauma.  Improving without antibiotics.  Continue to monitor Recent Labs  Lab 10/28/23 1550 10/29/23 1714 10/29/23 1745 10/30/23 0505  WBC 7.3 21.0*  --  12.3*   LATICACIDVEN  --   --  1.2  --    H/o PE 2023 Chronically anticoagulated with Eliquis  Currently on hold for surgery.  Resume postop when okay with orthopedics  Hypertension PTA meds- losartan  25 mg daily I would hold perioperatively. Continue to monitor blood pressure  HLD Lipitor  Right apical lung mass 10/28/2023, CT chest showed interval increase in size to 3.6cm.   Was previously seen by Dr. Shelah.  Outpatient follow-up recommended if further workup is desired   Right renal mass 10/28/2023, CT showed a 2 cm renal mass.  If further workup is desired, MRI and outpatient follow-up with urology recommended.  GERD PPI  Anxiety/depression Daughter-in-law states patient is on Lexapro at home.  She wants to wean her off Lexapro and consider Cymbalta  as needed.    Mobility: Needs PT eval  Goals of care   Code Status: Do not attempt resuscitation (DNR) PRE-ARREST INTERVENTIONS DESIRED     DVT prophylaxis:  SCDs Start: 10/30/23 1156 Place TED hose Start: 10/30/23 1156 Place and maintain sequential compression device Start: 10/29/23 2005 SCDs Start: 10/29/23 1957 apixaban  (ELIQUIS ) tablet 5 mg   Antimicrobials: None Fluid: None Consultants: Orthopedics Family Communication: Daughter-in-law and son at bedside  Status: Inpatient Level of care:  Med-Surg   Patient is from: Home Needs to continue in-hospital care: POD 0, pending PT eval Anticipated d/c to: Pending PT eval      Diet: Can start regular diet. Diet Order             Diet regular Room  service appropriate? Yes; Fluid consistency: Thin  Diet effective now                   Scheduled Meds:  acetaminophen   1,000 mg Oral TID   [START ON 10/31/2023] apixaban   5 mg Oral BID   atorvastatin   20 mg Oral Daily   [START ON 10/31/2023] dexamethasone  (DECADRON ) injection  10 mg Intravenous Once   fentaNYL        gabapentin   300 mg Oral BID   HYDROmorphone   2 mg Oral TID   methocarbamol        oxyCODONE         pantoprazole   80 mg Oral Q1200   polyethylene glycol  17 g Oral BID   senna  2 tablet Oral QHS   sodium chloride  flush  3-10 mL Intravenous Q12H   traZODone   100 mg Oral QHS    PRN meds: alum & mag hydroxide-simeth, bisacodyl , diphenhydrAMINE , fentaNYL , HYDROmorphone  (DILAUDID ) injection, labetalol , menthol -cetylpyridinium **OR** phenol, methocarbamol , methocarbamol  **OR** methocarbamol  (ROBAXIN ) injection, metoCLOPramide  **OR** metoCLOPramide  (REGLAN ) injection, oxyCODONE , oxyCODONE , oxyCODONE , sodium chloride  flush   Infusions:    ceFAZolin  (ANCEF ) IV     tranexamic acid        Antimicrobials: Anti-infectives (From admission, onward)    Start     Dose/Rate Route Frequency Ordered Stop   10/30/23 1300  ceFAZolin  (ANCEF ) IVPB 2g/100 mL premix        2 g 200 mL/hr over 30 Minutes Intravenous Every 6 hours 10/30/23 1200 10/31/23 0044   10/30/23 0806  ceFAZolin  (ANCEF ) 2-4 GM/100ML-% IVPB       Note to Pharmacy: Lebron Pulling M: cabinet override      10/30/23 0806 10/30/23 0856   10/30/23 0600  ceFAZolin  (ANCEF ) IVPB 2g/100 mL premix        2 g 200 mL/hr over 30 Minutes Intravenous On call to O.R. 10/29/23 1827 10/30/23 0846       Objective: Vitals:   10/30/23 1130 10/30/23 1202  BP: 115/62 (!) 120/51  Pulse: 81 83  Resp: 19 20  Temp: (!) 97.2 F (36.2 C) 99.1 F (37.3 C)  SpO2: 94% 95%    Intake/Output Summary (Last 24 hours) at 10/30/2023 1327 Last data filed at 10/30/2023 1007 Gross per 24 hour  Intake 1549.5 ml  Output 155 ml  Net 1394.5 ml   There were no vitals filed for this visit. Weight change:  There is no height or weight on file to calculate BMI.   Physical Exam: General exam: Pleasant, elderly Caucasian female.  Not in distress Skin: No rashes, lesions or ulcers. HEENT: Atraumatic, normocephalic, no obvious bleeding Lungs: Clear to auscultation bilaterally,  CVS: S1, S2, no murmur,   GI/Abd: Soft, nontender, nondistended, bowel sound present,   CNS:  Alert, awake, groggy from anesthesia Psychiatry: Mood appropriate,  Extremities: No pedal edema, no calf tenderness,   Data Review: I have personally reviewed the laboratory data and studies available.  F/u labs ordered Unresulted Labs (From admission, onward)     Start     Ordered   10/31/23 0500  CBC  Daily,   R      10/30/23 1200   10/31/23 0500  Basic metabolic panel  Tomorrow morning,   R        10/30/23 1200            Total time spent in review of labs and imaging, patient evaluation, formulation of plan, documentation and communication with family: 45 minutes  Signed, Chapman Rota,  MD [facility] 10/30/2023

## 2023-10-30 NOTE — Transfer of Care (Signed)
 Immediate Anesthesia Transfer of Care Note  Patient: Kristen [name]  Procedure(s) Performed: TOTAL HIP ARTHROPLASTY ANTERIOR APPROACH (Right: Hip)  Patient Location: PACU  Anesthesia Type:General  Level of Consciousness: awake and alert   Airway & Oxygen Therapy: Patient Spontanous Breathing and Patient connected to face mask oxygen  Post-op Assessment: Report given to RN and Post -op Vital signs reviewed and stable  Post vital signs: Reviewed and stable  Last Vitals:  Vitals Value Taken Time  BP 176/53 10/30/23 1003  Temp    Pulse 72 10/30/23 1008  Resp 16 10/30/23 1008  SpO2 100 % 10/30/23 1008  Vitals shown include unfiled device data.  Last Pain:  Vitals:   10/30/23 0809  TempSrc:   PainSc: 10-Worst pain ever         Complications: There were no known notable events for this encounter.

## 2023-10-30 NOTE — ED Notes (Signed)
 Synetta Fail RN from OR updated this RN regarding plan. Dr. Charlann Boxer to call pt's daughter, Lilli Few, regarding surgery during morning rounds and then to provide update to the ED staff on plan.

## 2023-10-30 NOTE — H&P (View-Only) (Signed)
 Reason for Consult:right hip fracture Referring Physician: Arlice, MD (Hospitalist)  [name] is an 89 y.o. female.  HPI: Kristen [name] is a 89 y.o. female with medical history significant of HTN, PAH, PE in 2023 now on chronic eliquis .   Pt with diagnosis on imaging recently of new possible lung CA (enlarging now 3.6cm RUL mass), may or may not have renal CA as well (2cm renal mass).  10lb wt loss recently and generalized weakness are her associated symptoms.   Today pt vacuming at her [facility], tripped, had mechanical fall, injuring her R hip.  Severe R hip pain and inability to bear wt.  In to ED.  Past Medical History:  Diagnosis Date   Anemia    Arthritis    B12 deficiency    Chronic pain disorder    trigeminal neuralgia   Depression    Dyslipidemia    Hypertension    Seizure disorder (HCC)    decades since last seizure   Tremor    Trigeminal nerve disorder     Past Surgical History:  Procedure Laterality Date   ABDOMINAL HYSTERECTOMY     BLADDER REPAIR     tack   CATARACT EXTRACTION     CHOLECYSTECTOMY     gamma knife  2004   NOSE SURGERY     for fracture   SHOULDER ARTHROSCOPY DISTAL CLAVICLE EXCISION AND OPEN ROTATOR CUFF REPAIR Left    rotary cuff repair   TONSILLECTOMY     TOTAL KNEE ARTHROPLASTY Bilateral    UMBILICAL HERNIA REPAIR      History reviewed. No pertinent family history.  Social History:  reports that she has never smoked. She has never been exposed to tobacco smoke. She has never used smokeless tobacco. She reports that she does not currently use alcohol. She reports that she does not use drugs.  Allergies:  Allergies  Allergen Reactions   Penicillins Hives and Rash    Other reaction(s): Other unknown    Tetracyclines & Related Anaphylaxis    Other reaction(s): Other unknown    Sulfa Antibiotics Hives and Other (See Comments)    unknown    Medications: I have reviewed the patient's current medications. Scheduled:  [MAR Hold]  atorvastatin   20 mg Oral Daily   chlorhexidine   60 mL Topical Once   [MAR Hold] gabapentin   300 mg Oral BID   [MAR Hold] pantoprazole   80 mg Oral Q1200   povidone-iodine   2 Application Topical Once   [MAR Hold] traZODone   100 mg Oral QHS    Results for orders placed or performed during the hospital encounter of 10/29/23 (from the past 24 hours)  Comprehensive metabolic panel     Status: Abnormal   Collection Time: 10/29/23  5:14 PM  Result Value Ref Range   Sodium 132 (L) 135 - 145 mmol/L   Potassium 4.0 3.5 - 5.1 mmol/L   Chloride 95 (L) 98 - 111 mmol/L   CO2 24 22 - 32 mmol/L   Glucose, Bld 136 (H) 70 - 99 mg/dL   BUN 19 8 - 23 mg/dL   Creatinine, Ser 8.79 (H) 0.44 - 1.00 mg/dL   Calcium  9.4 8.9 - 10.3 mg/dL   Total Protein 7.4 6.5 - 8.1 g/dL   Albumin  4.5 3.5 - 5.0 g/dL   AST 24 15 - 41 U/L   ALT 20 0 - 44 U/L   Alkaline Phosphatase 78 38 - 126 U/L   Total Bilirubin 0.9 0.0 - 1.2 mg/dL   GFR,  Estimated 44 (L) >60 mL/min   Anion gap 13 5 - 15  CBC     Status: Abnormal   Collection Time: 10/29/23  5:14 PM  Result Value Ref Range   WBC 21.0 (H) 4.0 - 10.5 K/uL   RBC 4.30 3.87 - 5.11 MIL/uL   Hemoglobin 13.7 12.0 - 15.0 g/dL   HCT 60.0 63.9 - 53.9 %   MCV 92.8 80.0 - 100.0 fL   MCH 31.9 26.0 - 34.0 pg   MCHC 34.3 30.0 - 36.0 g/dL   RDW 88.3 88.4 - 84.4 %   Platelets 259 150 - 400 K/uL   nRBC 0.0 0.0 - 0.2 %  Protime-INR     Status: Abnormal   Collection Time: 10/29/23  5:14 PM  Result Value Ref Range   Prothrombin Time 18.7 (H) 11.4 - 15.2 seconds   INR 1.5 (H) 0.8 - 1.2  Ethanol     Status: None   Collection Time: 10/29/23  5:30 PM  Result Value Ref Range   Alcohol, Ethyl (B) <10 <10 mg/dL  Sample to Blood Bank     Status: None   Collection Time: 10/29/23  5:37 PM  Result Value Ref Range   Blood Bank Specimen SAMPLE AVAILABLE FOR TESTING    Sample Expiration      11/01/2023,2359 Performed at [facility], [address], [place], [place] [address]    Type and screen [facility]     Status: None   Collection Time: 10/29/23  5:37 PM  Result Value Ref Range   ABO/RH(D) A NEG    Antibody Screen NEG    Sample Expiration      11/01/2023,2359 Performed at [facility], [address], [place], [place] [address]   I-Stat Chem 8, ED     Status: Abnormal   Collection Time: 10/29/23  5:45 PM  Result Value Ref Range   Sodium 133 (L) 135 - 145 mmol/L   Potassium 4.1 3.5 - 5.1 mmol/L   Chloride 96 (L) 98 - 111 mmol/L   BUN 20 8 - 23 mg/dL   Creatinine, Ser 8.79 (H) 0.44 - 1.00 mg/dL   Glucose, Bld 868 (H) 70 - 99 mg/dL   Calcium , Ion 1.08 (L) 1.15 - 1.40 mmol/L   TCO2 24 22 - 32 mmol/L   Hemoglobin 13.9 12.0 - 15.0 g/dL   HCT 58.9 63.9 - 53.9 %  I-Stat Lactic Acid, ED     Status: None   Collection Time: 10/29/23  5:45 PM  Result Value Ref Range   Lactic Acid, Venous 1.2 0.5 - 1.9 mmol/L  Urinalysis, Routine w reflex microscopic -Urine, Catheterized     Status: Abnormal   Collection Time: 10/29/23  9:10 PM  Result Value Ref Range   Color, Urine YELLOW YELLOW   APPearance CLEAR CLEAR   Specific Gravity, Urine 1.032 (H) 1.005 - 1.030   pH 5.0 5.0 - 8.0   Glucose, UA NEGATIVE NEGATIVE mg/dL   Hgb urine dipstick NEGATIVE NEGATIVE   Bilirubin Urine NEGATIVE NEGATIVE   Ketones, ur NEGATIVE NEGATIVE mg/dL   Protein, ur 30 (A) NEGATIVE mg/dL   Nitrite NEGATIVE NEGATIVE   Leukocytes,Ua SMALL (A) NEGATIVE   RBC / HPF 0-5 0 - 5 RBC/hpf   WBC, UA 0-5 0 - 5 WBC/hpf   Bacteria, UA NONE SEEN NONE SEEN   Squamous Epithelial / HPF 0-5 0 - 5 /HPF  CBC WITH DIFFERENTIAL     Status: Abnormal  Collection Time: 10/30/23  5:05 AM  Result Value Ref Range   WBC 12.3 (H) 4.0 - 10.5 K/uL   RBC 4.15 3.87 - 5.11 MIL/uL   Hemoglobin 13.4 12.0 - 15.0 g/dL   HCT 60.8 63.9 - 53.9 %   MCV 94.2 80.0 - 100.0 fL   MCH 32.3 26.0 - 34.0 pg   MCHC 34.3 30.0 - 36.0 g/dL   RDW 88.2 88.4 - 84.4 %   Platelets 203 150 - 400 K/uL   nRBC 0.0  0.0 - 0.2 %   Neutrophils Relative % 91 %   Neutro Abs 11.0 (H) 1.7 - 7.7 K/uL   Lymphocytes Relative 5 %   Lymphs Abs 0.7 0.7 - 4.0 K/uL   Monocytes Relative 3 %   Monocytes Absolute 0.4 0.1 - 1.0 K/uL   Eosinophils Relative 1 %   Eosinophils Absolute 0.2 0.0 - 0.5 K/uL   Basophils Relative 0 %   Basophils Absolute 0.1 0.0 - 0.1 K/uL   Immature Granulocytes 0 %   Abs Immature Granulocytes 0.04 0.00 - 0.07 K/uL  Comprehensive metabolic panel     Status: Abnormal   Collection Time: 10/30/23  5:05 AM  Result Value Ref Range   Sodium 133 (L) 135 - 145 mmol/L   Potassium 4.4 3.5 - 5.1 mmol/L   Chloride 95 (L) 98 - 111 mmol/L   CO2 26 22 - 32 mmol/L   Glucose, Bld 152 (H) 70 - 99 mg/dL   BUN 14 8 - 23 mg/dL   Creatinine, Ser 8.79 (H) 0.44 - 1.00 mg/dL   Calcium  9.1 8.9 - 10.3 mg/dL   Total Protein 6.6 6.5 - 8.1 g/dL   Albumin  3.9 3.5 - 5.0 g/dL   AST 21 15 - 41 U/L   ALT 16 0 - 44 U/L   Alkaline Phosphatase 68 38 - 126 U/L   Total Bilirubin 1.2 0.0 - 1.2 mg/dL   GFR, Estimated 44 (L) >60 mL/min   Anion gap 12 5 - 15     X-ray: CLINICAL DATA:  89 year old female status post fall landing on right hip. Right leg shoulder than left upon arrival.   EXAM: PORTABLE PELVIS 1-2 VIEWS; DG HIP (WITH OR WITHOUT PELVIS) 1V PORT RIGHT   COMPARISON:  CT 10/28/2023   FINDINGS: Acute mildly impacted subcapital fracture of the right femoral neck. The femoral head remains located in the acetabulum. Contrast from CT performed yesterday within the bladder.   IMPRESSION: Acute mildly impacted subcapital fracture of the right femoral neck.     Electronically Signed   By: Norman Gatlin M.D.  ROS: As per HPI  Blood pressure (!) 117/52, pulse 76, temperature 97.9 F (36.6 C), temperature source Oral, resp. rate 19, SpO2 96%.  Physical Exam: Constitutional:      Appearance: She is well-developed. She is not ill-appearing.  HENT:     Head: Normocephalic and atraumatic.  Eyes:      Pupils: Pupils are equal, round, and reactive to light.  Cardiovascular:     Rate and Rhythm: Normal rate and regular rhythm.     Heart sounds: Normal heart sounds.  Pulmonary:     Effort: Pulmonary effort is normal. No respiratory distress.  Abdominal:     Palpations: Abdomen is soft.     Tenderness: There is no abdominal tenderness. There is no guarding or rebound.  Musculoskeletal:     Cervical back: Neck supple.     Comments: Tenderness to palpation of the right hip  with pain with range of motion, slight foreshortening with DP pulses, RLE shortened and externally rotated Skin:    General: Skin is warm and dry.  Neurological:     Mental Status: She is alert and oriented to person, place, and time.  Psychiatric:        Mood and Affect: Mood normal.   Assessment/Plan: Right hip femoral neck fracture  Plan: Given the injury to her hip and relative independent living environment I have recommended treating this with a total hip replacement I have spoken with her daughter about proceeding with surgery iin setting of Eliquis  use as well as the planned procedure rationale Reviewed post op course and expectations NPO Consent on chart To OR this morning for right THR  Donnice JONETTA Car 10/30/2023, 7:56 AM

## 2023-10-30 NOTE — Plan of Care (Signed)
   Problem: Health Behavior/Discharge Planning: Goal: Ability to manage health-related needs will improve Outcome: Progressing

## 2023-10-30 NOTE — Anesthesia Procedure Notes (Signed)
 Procedure Name: Intubation Date/Time: 10/30/2023 8:45 AM  Performed by: Tressie Gilmore RAMAN, CRNAPre-anesthesia Checklist: Patient identified, Emergency Drugs available, Suction available and Patient being monitored Patient Re-evaluated:Patient Re-evaluated prior to induction Oxygen Delivery Method: Circle System Utilized Preoxygenation: Pre-oxygenation with 100% oxygen Induction Type: IV induction Ventilation: Mask ventilation without difficulty Laryngoscope Size: Miller and 2 Grade View: Grade II Tube type: Oral Tube size: 7.0 mm Number of attempts: 1 Airway Equipment and Method: Stylet and Oral airway Placement Confirmation: ETT inserted through vocal cords under direct vision, positive ETCO2 and breath sounds checked- equal and bilateral Tube secured with: Tape Dental Injury: Teeth and Oropharynx as per pre-operative assessment

## 2023-10-30 NOTE — Op Note (Signed)
 NAME:  Kristen [name]                ACCOUNT NO.: [id number]      MEDICAL RECORD NO.: [id number]      FACILITY:  [facility]      PHYSICIAN:  Donnice JONETTA Car  DATE OF BIRTH:  Dec 31, 1932     DATE OF PROCEDURE:  10/30/2023                                 OPERATIVE REPORT         PREOPERATIVE DIAGNOSIS: Right hip displaced femoral neck fracture      POSTOPERATIVE DIAGNOSIS:  Right hip displaced femoral neck fracture     PROCEDURE:  Right total hip replacement through an anterior approach   utilizing DePuy THR system, component size 49 mm pinnacle cup, a size 32+4 neutral   Altrex liner, a size 6 Hi Actis stem with a 32+5 Articuleze metal head ball     SURGEON:  Donnice JONETTA. Car, M.D.      ASSISTANT:  Rosina Calin, PA-C     ANESTHESIA:  General.      SPECIMENS:  None.      COMPLICATIONS:  None.      BLOOD LOSS:  200 cc     DRAINS:  None.      INDICATION OF THE PROCEDURE:  Kristen [name] is a 89 y.o. female who currently lives in independent living.  She was up trying to use a vacuum cleaner and got caught up in the cord.  She fell to her right side.  She had immediate onset of pain in her right hip.  She has brought to the [facility] emergency room where radiographs revealed a displaced femoral neck fracture.  She is 89 years old and does have medical comorbidities however she currently lives independently.  I do feel that total hip replacement would offer her the best chance for longevity and improve function without consideration of further surgery.  She does have a history of a right total knee replacement that has been persistently painful since it was performed sometime ago in [place].  I reviewed with her daughter the procedure itself.  We discussed her use of Eliquis .  We reviewed the postoperative course and expectations.  Standard risks of infection DVT dislocation neurovascular injury and need for future surgeries present as risk for total hip replacement.  Indication  for hip replacement to manage her right hip fracture and pain.  Consent was obtained for the benefit of pain relief and function.     PROCEDURE IN DETAIL:  The patient was brought to operative theater.   Once adequate anesthesia, preoperative antibiotics, 2 gm of Ancef , 1 gm of Tranexamic Acid , and 10 mg of Decadron  were administered, the patient was positioned supine on the [employer] table.  Once the patient was safely positioned with adequate padding of boney prominences we predraped out the hip, and used fluoroscopy to confirm orientation of the pelvis.      The right hip was then prepped and draped from proximal iliac crest to   mid thigh with a shower curtain technique.      Time-out was performed identifying the patient, planned procedure, and the appropriate extremity.     An incision was then made 2 cm lateral to the   anterior superior iliac spine extending over the orientation of the   tensor fascia lata  muscle and sharp dissection was carried down to the   fascia of the muscle.      The fascia was then incised.  The muscle belly was identified and swept   laterally and retractor placed along the superior neck.  Following   cauterization of the circumflex vessels and removing some pericapsular   fat, a second cobra retractor was placed on the inferior neck.  A T-capsulotomy was made along the line of the   superior neck to the trochanteric fossa, then extended proximally and   distally.  Tag sutures were placed and the retractors were then placed   intracapsular.  We then identified the trochanteric fossa and   orientation of my neck cut and then made a neck osteotomy with the femur on traction.  The femoral   head was removed without difficulty or complication.  Traction was let   off and retractors were placed posterior and anterior around the   acetabulum.      The labrum and foveal tissue were debrided.  I began reaming with a 44 mm   reamer and reamed up to 47 mm reamer  with good bony bed preparation and a 48 mm  cup was chosen.  The final 48 mm Pinnacle cup was then impacted under fluoroscopy to confirm the depth of penetration and orientation with respect to   Abduction and forward flexion.  A screw was placed into the ilium followed by the hole eliminator.  The final   32+4 neutral Altrex liner was impacted with good visualized rim fit.  The cup was positioned anatomically within the acetabular portion of the pelvis.      At this point, the femur was rolled to 100 degrees.  Further capsule was   released off the inferior aspect of the femoral neck.  I then   released the superior capsule proximally.  With the leg in a neutral position the hook was placed laterally   along the femur under the vastus lateralis origin and elevated manually and then held in position using the hook attachment on the bed.  The leg was then extended and adducted with the leg rolled to 100   degrees of external rotation.  Retractors were placed along the medial calcar and posteriorly over the greater trochanter.  Once the proximal femur was fully   exposed, I used a box osteotome to set orientation.  I then began   broaching with the starting chili pepper broach and passed this by hand and then broached up to 6.  With the 6 broach in place I chose a high offset neck and did several trial reductions.  The offset was appropriate, leg lengths   appeared to be equal best matched with the +5 head ball trial confirmed radiographically.   Given these findings, I went ahead and dislocated the hip, repositioned all   retractors and positioned the right hip in the extended and abducted position.  The final 6 Hi Actis stem was   chosen and it was impacted down to the level of neck cut.  Based on this   and the trial reductions, a final 32+5 Articuleze metal head ball was chosen and   impacted onto a clean and dry trunnion, and the hip was reduced.  The   hip had been irrigated throughout the  case again at this point.  I did   reapproximate the superior capsular leaflet to the anterior leaflet   using #1 Vicryl.  The fascia of the  tensor fascia lata muscle was then reapproximated using #1 Vicryl and #0 Stratafix sutures.  The   remaining wound was closed with 2-0 Vicryl and running 4-0 Monocryl.   The hip was cleaned, dried, and dressed sterilely using Dermabond and   Aquacel dressing.  The patient was then brought   to recovery room in stable condition tolerating the procedure well.    Rosina Calin, PA-C was present for the entirety of the case involved from   preoperative positioning, perioperative retractor management, general   facilitation of the case, as well as primary wound closure as assistant.            Donnice CORDOBA Ernie, M.D.        10/30/2023 8:18 AM

## 2023-10-31 DIAGNOSIS — S72001A Fracture of unspecified part of neck of right femur, initial encounter for closed fracture: Secondary | ICD-10-CM | POA: Diagnosis not present

## 2023-10-31 LAB — CBC
HCT: 27.5 % — ABNORMAL LOW (ref 36.0–46.0)
Hemoglobin: 9.5 g/dL — ABNORMAL LOW (ref 12.0–15.0)
MCH: 32.5 pg (ref 26.0–34.0)
MCHC: 34.5 g/dL (ref 30.0–36.0)
MCV: 94.2 fL (ref 80.0–100.0)
Platelets: 171 10*3/uL (ref 150–400)
RBC: 2.92 MIL/uL — ABNORMAL LOW (ref 3.87–5.11)
RDW: 11.8 % (ref 11.5–15.5)
WBC: 10 10*3/uL (ref 4.0–10.5)
nRBC: 0 % (ref 0.0–0.2)

## 2023-10-31 LAB — BASIC METABOLIC PANEL
Anion gap: 10 (ref 5–15)
BUN: 20 mg/dL (ref 8–23)
CO2: 22 mmol/L (ref 22–32)
Calcium: 8.2 mg/dL — ABNORMAL LOW (ref 8.9–10.3)
Chloride: 96 mmol/L — ABNORMAL LOW (ref 98–111)
Creatinine, Ser: 1.23 mg/dL — ABNORMAL HIGH (ref 0.44–1.00)
GFR, Estimated: 42 mL/min — ABNORMAL LOW (ref >60)
Glucose, Bld: 169 mg/dL — ABNORMAL HIGH (ref 70–99)
Potassium: 4.4 mmol/L (ref 3.5–5.1)
Sodium: 128 mmol/L — ABNORMAL LOW (ref 135–145)

## 2023-10-31 MED ORDER — HYDROXYZINE HCL 25 MG PO TABS
25.0000 mg | ORAL_TABLET | Freq: Every day | ORAL | Status: DC
  Administered 2023-10-31 – 2023-11-03 (×4): 25 mg via ORAL
  Filled 2023-10-31 (×4): qty 1

## 2023-10-31 MED ORDER — VENLAFAXINE HCL 25 MG PO TABS
25.0000 mg | ORAL_TABLET | Freq: Two times a day (BID) | ORAL | Status: DC
  Administered 2023-10-31 – 2023-11-04 (×8): 25 mg via ORAL
  Filled 2023-10-31 (×9): qty 1

## 2023-10-31 NOTE — Progress Notes (Signed)
 Transition of Care [facility]) - CAGE-AID Screening   Patient Details  Name: Kristen [name] MRN: [id number] Date of Birth: May 05, 1933  Transition of Care [facility] CM/SW Contact:    Bernardino Mayotte, RN Phone Number: 10/31/2023, 8:42 PM   Clinical Narrative:  Patient denies the use of alcohol and illicit substances. Resources not given at this time.  CAGE-AID Screening:    Have You Ever Felt You Ought to Cut Down on Your Drinking or Drug Use?: No Have People Annoyed You By Critizing Your Drinking Or Drug Use?: No Have You Felt Bad Or Guilty About Your Drinking Or Drug Use?: No Have You Ever Had a Drink or Used Drugs First Thing In The Morning to Steady Your Nerves or to Get Rid of a Hangover?: No CAGE-AID Score: 0  Substance Abuse Education Offered: No

## 2023-10-31 NOTE — Progress Notes (Signed)
   Subjective: 1 Day Post-Op Procedure(s) (LRB): TOTAL HIP ARTHROPLASTY ANTERIOR APPROACH (Right)  Pt c/o moderate pain/soreness this morning Denies any new symptoms overnight Otherwise doing fair Patient reports pain as moderate.  Objective:   VITALS:   Vitals:   10/31/23 0403 10/31/23 0720  BP: (!) 144/57 (!) 195/54  Pulse: 64 80  Resp: 14 18  Temp: 97.9 F (36.6 C) (!) 97.5 F (36.4 C)  SpO2: 98% 100%    Right hip incision healing well Dressing intact Nv intact distally No rashes or drainage  LABS Recent Labs    10/28/23 1550 10/29/23 1714 10/29/23 1745 10/30/23 0505  HGB 12.5 13.7 13.9 13.4  HCT 35.3* 39.9 41.0 39.1  WBC 7.3 21.0*  --  12.3*  PLT 248 259  --  203    Recent Labs    10/29/23 1714 10/29/23 1745 10/30/23 0505  NA 132* 133* 133*  K 4.0 4.1 4.4  BUN 19 20 14   CREATININE 1.20* 1.20* 1.20*  GLUCOSE 136* 131* 152*     Assessment/Plan: 1 Day Post-Op Procedure(s) (LRB): TOTAL HIP ARTHROPLASTY ANTERIOR APPROACH (Right) PT/OT Pain management Pulmonary toilet Will monitor her progress    Brad Melvenia RIGGERS, MPAS [facility] is now [employer] [facility] [address], [place], [place] [address] Phone: [phone] [email or web address] Facebook  [employer]

## 2023-10-31 NOTE — Plan of Care (Signed)
   Problem: Health Behavior/Discharge Planning: Goal: Ability to manage health-related needs will improve Outcome: Progressing

## 2023-10-31 NOTE — Evaluation (Signed)
 Physical Therapy Evaluation Patient Details Name: Kristen [name] MRN: [id number] DOB: 23-Nov-1932 Today's Date: 10/31/2023  History of Present Illness  Kristen [name] is a 89 y.o. female was admitted after a fall resulting in R hip fx, s/p anterior THA for surgical fixation, WBAT; noteworthy that pt had weakness for teh weeks leading up to this fall, and 10 pound weight loss.  CT abdomen and pelvis was obtained which showed an enlarging right apical lung mass and right renal mass; with PMH significant for HTN, HLD, pulmonary hypertension, PE 2023 now on chronic Eliquis ,  Clinical Impression   Pt admitted with above diagnosis. Lives at home in an independent living apt, walks to/from the dining hall wit RW without difficulty, has stopped driving this year; Reports had been feeling weaker the past few weeks leading up to the fall that precipitated this admission; Presents to PT with generalized weakness, functional dependencies, decr functional mobility, incr fall risk;  Overall needing light mod assist to come to sitting EOB, min assist ot stand and walk approx 20 ft with RW, distance limited by nausea and feeling wek; will consider getting a set of orthostatic BPs next session;   Her baseline is being independent with mobility and ADLs; she participated very well today; Pt and daughter are asking about the possibility of getting to AIR for post-acute rehab to maximize independence and safety with mobility and ADLs; while pt's precipitating admitting diagnosis doesn't necessarily support an AIR stay for rehab, when I consider the oncologic diagnoses, her age >80, and her excellent participation today, I believe it's worth a shot to ask about the possibility of AIR;   Pt currently with functional limitations due to the deficits listed below (see PT Problem List). Pt will benefit from skilled PT to increase their independence and safety with mobility to allow discharge to the venue listed below.      Also  recommend Palliative Care Team Consult for a holistic approach to pt's goals of care      If plan is discharge home, recommend the following: A lot of help with walking and/or transfers;A lot of help with bathing/dressing/bathroom;Assistance with cooking/housework;Help with stairs or ramp for entrance   Can travel by private vehicle        Equipment Recommendations Rolling walker (2 wheels);BSC/3in1  Recommendations for Other Services  Rehab consult;OT consult (ordered OT per protocol; Recommend Palliative Care Team Consult for a holistic approach to pt's overall goals of care)    Functional Status Assessment Patient has had a recent decline in their functional status and demonstrates the ability to make significant improvements in function in a reasonable and predictable amount of time.     Precautions / Restrictions Restrictions RLE Weight Bearing Per Provider Order: Weight bearing as tolerated      Mobility  Bed Mobility Overal bed mobility: Needs Assistance Bed Mobility: Supine to Sit     Supine to sit: Mod assist     General bed mobility comments: Light mod assist to come to sit and step-by-step cues    Transfers Overall transfer level: Needs assistance Equipment used: Rolling walker (2 wheels) Transfers: Sit to/from Stand Sit to Stand: Mod assist           General transfer comment: Cues for hand placement and safety; light mod assist to power up    Ambulation/Gait Ambulation/Gait assistance: Min assist, +2 safety/equipment (chair follow) Gait Distance (Feet): 20 Feet Assistive device: Rolling walker (2 wheels) Gait Pattern/deviations: Step-to pattern  General Gait Details: Cues for gait sequence, and to self-monitor for activity tolerance; noteworthy that pt expressed minimal pain  Stairs            Wheelchair Mobility     Tilt Bed    Modified Rankin (Stroke Patients Only)       Balance Overall balance assessment: Needs  assistance   Sitting balance-Leahy Scale: Fair       Standing balance-Leahy Scale: Poor (approaching Fair)                               Pertinent Vitals/Pain Pain Assessment Pain Assessment: Faces Faces Pain Scale: Hurts a little bit Pain Location: R hip Pain Descriptors / Indicators: Dull, Grimacing Pain Intervention(s): Monitored during session, Repositioned    Home Living Family/patient expects to be discharged to:: Private residence Living Arrangements: Alone Available Help at Discharge: Family;Available PRN/intermittently Type of Home: Apartment Home Access: Level entry       Home Layout: One level   Additional Comments: Apartment at Independent Living    Prior Function Prior Level of Function : Independent/Modified Independent             Mobility Comments: Walking with RW, walks to/from teh dining hall; weakness was slowly increasing inthe weeks leading up to the fall ADLs Comments: independent in ADLs; family assist with IADLs; was gettign her vacuum cleaner out when she fell     Extremity/Trunk Assessment   Upper Extremity Assessment Upper Extremity Assessment: Defer to OT evaluation    Lower Extremity Assessment Lower Extremity Assessment: RLE deficits/detail RLE Deficits / Details: Some pain/grimace with movement, but overall moving RLE well, and tolerating full weight bearing    Cervical / Trunk Assessment Cervical / Trunk Assessment: Kyphotic  Communication   Communication Communication: No apparent difficulties  Cognition Arousal: Alert Behavior During Therapy: WFL for tasks assessed/performed Overall Cognitive Status: Within Functional Limits for tasks assessed                                          General Comments General comments (skin integrity, edema, etc.): Pt reported feeling weak just before she had to sit; will consider getting a set of orthostatics next session    Exercises      Assessment/Plan    PT Assessment Patient needs continued PT services  PT Problem List Decreased strength;Decreased activity tolerance;Decreased balance;Decreased mobility;Decreased coordination;Decreased knowledge of use of DME;Decreased knowledge of precautions;Cardiopulmonary status limiting activity;Pain       PT Treatment Interventions DME instruction;Gait training;Stair training;Functional mobility training;Therapeutic activities;Therapeutic exercise;Balance training;Neuromuscular re-education;Cognitive remediation;Patient/family education    PT Goals (Current goals can be found in the Care Plan section)  Acute Rehab PT Goals Patient Stated Goal: Hopes to be able to get home PT Goal Formulation: With patient Time For Goal Achievement: 11/14/23 Potential to Achieve Goals: Good    Frequency Min 1X/week     Co-evaluation               AM-PAC PT 6 Clicks Mobility  Outcome Measure Help needed turning from your back to your side while in a flat bed without using bedrails?: A Little Help needed moving from lying on your back to sitting on the side of a flat bed without using bedrails?: A Little Help needed moving to and from a bed to a chair (including  a wheelchair)?: A Lot Help needed standing up from a chair using your arms (e.g., wheelchair or bedside chair)?: A Little Help needed to walk in hospital room?: A Lot Help needed climbing 3-5 steps with a railing? : A Lot 6 Click Score: 15    End of Session Equipment Utilized During Treatment: Gait belt Activity Tolerance: Patient tolerated treatment well Patient left: in chair;with call bell/phone within reach;with family/visitor present Nurse Communication: Mobility status PT Visit Diagnosis: Unsteadiness on feet (R26.81);Other abnormalities of gait and mobility (R26.89);History of falling (Z91.81);Muscle weakness (generalized) (M62.81)    Time: 8863-8787 PT Time Calculation (min) (ACUTE ONLY): 36 min   Charges:    PT Evaluation $PT Eval Moderate Complexity: 1 Mod PT Treatments $Gait Training: 8-22 mins PT General Charges $$ ACUTE PT VISIT: 1 Visit         Silvano Currier, PT  Acute Rehabilitation Services Office [phone] Secure Chat welcomed   Silvano VEAR Currier 10/31/2023, 5:43 PM

## 2023-10-31 NOTE — Progress Notes (Signed)
 PROGRESS NOTE  Katelen Luepke  DOB: 02-16-1933  PCP: Charlott Dorn LABOR, MD FMW:[id number]  DOA: 10/29/2023  LOS: 2 days  Hospital Day: 3  Brief narrative: Kristen [name] is a 89 y.o. female with PMH significant for HTN, HLD, pulmonary hypertension, PE 2023 now on chronic Eliquis ,    Patient lives in an ILF. 1/3, patient tripped, had a mechanical fall and sustained severe pain ability to bear weight on her right hip.  Of note, patient was seen in the ED the day prior on 1/2 for generalized weakness and 10 pound weight loss.  CT abdomen and pelvis was obtained which showed an enlarging right apical lung mass and right renal mass.  She was discharged to follow-up with her pulmonologist.     In the ED, Blood pressure to 200s Labs with WBC count 21,000.  Sodium 132, creatinine 1.2, blood alcohol not elevated X-ray showed acute mildly impacted subcapital fracture of the right femoral neck.  EDP discussed with orthopedics Dr. Celena Recommended surgical fixation  Admitted to [facility]  Subjective: Patient was seen and examined this morning Alert, awake, propped up in bed.  Not in distress.  Pain controlled. Slept well last night.  Family at bedside. All home meds reviewed.  Adjustments made. Labs not available this morning  Assessment and plan: Closed right hip fracture Secondary to mechanical fall Imaging as above Per orthopedics, tentative plan of surgical fixation today Pain regimen -scheduled Tylenol , PRN oxycodone , PRN IV Dilaudid .  Per family, patient is on scheduled Dilaudid  2 mg oral at home.  Continue the same Bowel regimen -as needed   DVT prophylaxis -resume Eliquis  tonight.   Leukocytosis No evidence of infection.  Leukocytosis likely due to stress of trauma.  Improving without antibiotics.  Continue to monitor Recent Labs  Lab 10/28/23 1550 10/29/23 1714 10/29/23 1745 10/30/23 0505  WBC 7.3 21.0*  --  12.3*  LATICACIDVEN  --   --  1.2  --    H/o PE 2023 Chronically  anticoagulated with Eliquis  Currently on hold for surgery.  Resume postop when okay with orthopedics  Hypertension PTA meds- losartan  25 mg daily Resume today.  HLD Lipitor  Right apical lung mass 10/28/2023, CT chest showed interval increase in size to 3.6cm.   Was previously seen by Dr. Shelah.  Outpatient follow-up recommended if further workup is desired   Right renal mass 10/28/2023, CT showed a 2 cm renal mass.  If further workup is desired, MRI and outpatient follow-up with urology recommended.  GERD PPI  Anxiety/depression PT notes reviewed with daughter.  Recently listed as an outpatient by PCP and psychiatry.  Was taking Effexor  ER 75 mg daily and hydroxyzine  nightly.  Daughter thinks that Effexor  is giving her nausea.  She requested to wean patient off Effexor .  I switched Effexor  to 25 mg twice daily.  Continue hydroxyzine  nightly.   Mobility: Needs PT eval  Goals of care   Code Status: Do not attempt resuscitation (DNR) PRE-ARREST INTERVENTIONS DESIRED     DVT prophylaxis:  SCDs Start: 10/30/23 1156 Place TED hose Start: 10/30/23 1156 Place and maintain sequential compression device Start: 10/29/23 2005 SCDs Start: 10/29/23 1957 apixaban  (ELIQUIS ) tablet 5 mg   Antimicrobials: None Fluid: None Consultants: Orthopedics Family Communication: Daughter-in-law and son at bedside  Status: Inpatient Level of care:  Med-Surg   Patient is from: Home Needs to continue in-hospital care: POD 1, pending PT eval Anticipated d/c to: Pending PT eval    Diet: Can start regular diet. Diet  Order             Diet regular Room service appropriate? Yes; Fluid consistency: Thin  Diet effective now                   Scheduled Meds:  acetaminophen   1,000 mg Oral TID   apixaban   5 mg Oral BID   atorvastatin   20 mg Oral Daily   gabapentin   300 mg Oral BID   HYDROmorphone   2 mg Oral TID   hydrOXYzine   25 mg Oral QHS   pantoprazole   80 mg Oral Q1200   polyethylene  glycol  17 g Oral BID   senna  2 tablet Oral QHS   sodium chloride  flush  3-10 mL Intravenous Q12H   venlafaxine   25 mg Oral BID WC    PRN meds: alum & mag hydroxide-simeth, bisacodyl , diphenhydrAMINE , HYDROmorphone  (DILAUDID ) injection, labetalol , menthol -cetylpyridinium **OR** phenol, methocarbamol  **OR** methocarbamol  (ROBAXIN ) injection, metoCLOPramide  **OR** metoCLOPramide  (REGLAN ) injection, oxyCODONE , oxyCODONE , sodium chloride  flush   Infusions:      Antimicrobials: Anti-infectives (From admission, onward)    Start     Dose/Rate Route Frequency Ordered Stop   10/30/23 1300  ceFAZolin  (ANCEF ) IVPB 2g/100 mL premix        2 g 200 mL/hr over 30 Minutes Intravenous Every 6 hours 10/30/23 1200 10/30/23 2200   10/30/23 0806  ceFAZolin  (ANCEF ) 2-4 GM/100ML-% IVPB       Note to Pharmacy: Lebron Pulling M: cabinet override      10/30/23 0806 10/30/23 0856   10/30/23 0600  ceFAZolin  (ANCEF ) IVPB 2g/100 mL premix        2 g 200 mL/hr over 30 Minutes Intravenous On call to O.R. 10/29/23 1827 10/30/23 0846       Objective: Vitals:   10/31/23 0403 10/31/23 0720  BP: (!) 144/57 (!) 195/54  Pulse: 64 80  Resp: 14 18  Temp: 97.9 F (36.6 C) (!) 97.5 F (36.4 C)  SpO2: 98% 100%    Intake/Output Summary (Last 24 hours) at 10/31/2023 1408 Last data filed at 10/30/2023 1700 Gross per 24 hour  Intake 200 ml  Output --  Net 200 ml   There were no vitals filed for this visit. Weight change:  There is no height or weight on file to calculate BMI.   Physical Exam: General exam: Pleasant, elderly Caucasian female.  Not in distress Skin: No rashes, lesions or ulcers. HEENT: Atraumatic, normocephalic, no obvious bleeding Lungs: Clear to auscultation bilaterally,  CVS: S1, S2, no murmur GI/Abd: Soft, nontender, nondistended, bowel sound present,   CNS: Alert, awake, oriented X 3 Psychiatry: Mood appropriate,  Extremities: No pedal edema, no calf tenderness,   Data Review: I have  personally reviewed the laboratory data and studies available.  F/u labs ordered Unresulted Labs (From admission, onward)     Start     Ordered   11/01/23 0500  Basic metabolic panel  Tomorrow morning,   R        10/31/23 0909   11/01/23 0500  CBC with Differential/Platelet  Tomorrow morning,   R        10/31/23 0909   10/31/23 0500  CBC  Daily,   R      10/30/23 1200   10/31/23 0500  Basic metabolic panel  Tomorrow morning,   R        10/30/23 1200            Total time spent in review of labs and imaging,  patient evaluation, formulation of plan, documentation and communication with family: 45 minutes  Signed, Chapman Rota, MD [facility] 10/31/2023

## 2023-11-01 ENCOUNTER — Encounter (HOSPITAL_COMMUNITY): Payer: Self-pay | Admitting: Orthopedic Surgery

## 2023-11-01 DIAGNOSIS — S72001A Fracture of unspecified part of neck of right femur, initial encounter for closed fracture: Secondary | ICD-10-CM | POA: Diagnosis not present

## 2023-11-01 LAB — BASIC METABOLIC PANEL
Anion gap: 13 (ref 5–15)
BUN: 22 mg/dL (ref 8–23)
CO2: 22 mmol/L (ref 22–32)
Calcium: 8.8 mg/dL — ABNORMAL LOW (ref 8.9–10.3)
Chloride: 95 mmol/L — ABNORMAL LOW (ref 98–111)
Creatinine, Ser: 1.07 mg/dL — ABNORMAL HIGH (ref 0.44–1.00)
GFR, Estimated: 50 mL/min — ABNORMAL LOW (ref >60)
Glucose, Bld: 123 mg/dL — ABNORMAL HIGH (ref 70–99)
Potassium: 4.8 mmol/L (ref 3.5–5.1)
Sodium: 130 mmol/L — ABNORMAL LOW (ref 135–145)

## 2023-11-01 LAB — CBC WITH DIFFERENTIAL/PLATELET
Abs Immature Granulocytes: 0.06 10*3/uL (ref 0.00–0.07)
Basophils Absolute: 0 10*3/uL (ref 0.0–0.1)
Basophils Relative: 0 %
Eosinophils Absolute: 0 10*3/uL (ref 0.0–0.5)
Eosinophils Relative: 0 %
HCT: 28.9 % — ABNORMAL LOW (ref 36.0–46.0)
Hemoglobin: 10.2 g/dL — ABNORMAL LOW (ref 12.0–15.0)
Immature Granulocytes: 1 %
Lymphocytes Relative: 6 %
Lymphs Abs: 0.7 10*3/uL (ref 0.7–4.0)
MCH: 33.1 pg (ref 26.0–34.0)
MCHC: 35.3 g/dL (ref 30.0–36.0)
MCV: 93.8 fL (ref 80.0–100.0)
Monocytes Absolute: 0.6 10*3/uL (ref 0.1–1.0)
Monocytes Relative: 5 %
Neutro Abs: 10.3 10*3/uL — ABNORMAL HIGH (ref 1.7–7.7)
Neutrophils Relative %: 88 %
Platelets: 182 10*3/uL (ref 150–400)
RBC: 3.08 MIL/uL — ABNORMAL LOW (ref 3.87–5.11)
RDW: 11.9 % (ref 11.5–15.5)
WBC: 11.7 10*3/uL — ABNORMAL HIGH (ref 4.0–10.5)
nRBC: 0 % (ref 0.0–0.2)

## 2023-11-01 MED ORDER — LOSARTAN POTASSIUM 50 MG PO TABS
25.0000 mg | ORAL_TABLET | Freq: Every day | ORAL | Status: DC
  Administered 2023-11-01 – 2023-11-02 (×2): 25 mg via ORAL
  Filled 2023-11-01 (×2): qty 1

## 2023-11-01 MED ORDER — BOOST PLUS PO LIQD
237.0000 mL | Freq: Two times a day (BID) | ORAL | Status: DC
  Administered 2023-11-01 – 2023-11-04 (×5): 237 mL via ORAL
  Filled 2023-11-01 (×7): qty 237

## 2023-11-01 MED ORDER — HYDRALAZINE HCL 25 MG PO TABS
25.0000 mg | ORAL_TABLET | Freq: Four times a day (QID) | ORAL | Status: DC | PRN
  Administered 2023-11-02: 25 mg via ORAL
  Filled 2023-11-01: qty 1

## 2023-11-01 NOTE — Care Management Important Message (Signed)
 Important Message  Patient Details  Name: Kristen [name] MRN: [id number] Date of Birth: 1933/09/04   Important Message Given:  Yes - Medicare IM     Dorena Bodo 11/01/2023, 3:32 PM

## 2023-11-01 NOTE — Progress Notes (Signed)
 Inpatient Rehab Admissions Coordinator:   Per therapy recommendations, patient was screened for CIR candidacy by Leita Kleine, MS, CCC-SLP. At this time, Pt. does not appear to demonstrate sufficient medical complexity to gain approval from [facility] in hospital rehabilitation/CIR. will not pursue a rehab consult for this Pt.   Recommend other rehab venues to be pursued.  Please contact me with any questions.  Leita Kleine, MS, CCC-SLP Rehab Admissions Coordinator  [phone] (celll) [phone] (office)

## 2023-11-01 NOTE — Progress Notes (Signed)
 Physical Therapy Treatment Patient Details Name: Kristen [name] MRN: [id number] DOB: 22-Dec-1932 Today's Date: 11/01/2023   History of Present Illness Kristen [name] is a 89 y.o. female was admitted after a fall resulting in R hip fx, s/p anterior THA for surgical fixation, WBAT; noteworthy that pt had weakness for teh weeks leading up to this fall, and 10 pound weight loss.  CT abdomen and pelvis was obtained which showed an enlarging right apical lung mass and right renal mass; with PMH significant for HTN, HLD, pulmonary hypertension, PE 2023 now on chronic Eliquis ,    PT Comments  Continuing work on functional mobility and activity tolerance;  Session focused on more gait and functional transfers; Kristen [name] was able to walk into the bathroom, void, stand at the sink for hand-washing, and then walk back to the bed; Overall min assist with a few episodes of mod assist to staedy, especially during turns in the bathroom, when her R knee buckled x2; noting also that when pt is in a rush to use the bathroom, she is more impulsive to sit (understandably), and less attentive to positioning and safety/hand placement cues;   Pt and daughter tell me she has a leg length discrepancy and tends to use a lift in her R shoe; asked her daughter to bring in pt's own shoes to practice in;   Patient will benefit from intensive inpatient follow up therapy, >3 hours/day     If plan is discharge home, recommend the following: A lot of help with walking and/or transfers;A lot of help with bathing/dressing/bathroom;Assistance with cooking/housework;Help with stairs or ramp for entrance   Can travel by private vehicle        Equipment Recommendations  Rolling walker (2 wheels);BSC/3in1    Recommendations for Other Services Rehab consult;OT consult (ordered OT per protocol; Recommend Palliative Care Team Consult for a holistic approach to pt's overall goals of care)     Precautions / Restrictions Precautions Precautions:  Fall Restrictions Weight Bearing Restrictions Per Provider Order: Yes RLE Weight Bearing Per Provider Order: Weight bearing as tolerated     Mobility  Bed Mobility Overal bed mobility: Needs Assistance Bed Mobility: Supine to Sit, Sit to Supine     Supine to sit: Min assist Sit to supine: Contact guard assist   General bed mobility comments: min assist to scoot foward    Transfers Overall transfer level: Needs assistance Equipment used: Rolling walker (2 wheels) Transfers: Sit to/from Stand Sit to Stand: Min assist           General transfer comment: cues for hand placement, and to position in front of seat before sitting; stood from recliner, and from 3in1 (over toilet)    Ambulation/Gait Ambulation/Gait assistance: Min assist, Mod assist Gait Distance (Feet): 20 Feet (to and from bathroom) Assistive device: Rolling walker (2 wheels) Gait Pattern/deviations: Step-to pattern       General Gait Details: Cues for gait sequence, and to self-monitor for activity tolerance; noteworthy that pt expressed minimal pain; 2 episodes of pt's R knee buckling in stance during turning in the bathroom; light mod assist to steady; tells me both of her knees buckle from time to time, she attributes this to worn out TKAs, both R and L   Stairs             Wheelchair Mobility     Tilt Bed    Modified Rankin (Stroke Patients Only)       Balance     Sitting balance-Leahy Scale: Fair  Standing balance-Leahy Scale: Poor Standing balance comment: Pt took both hands off fo teh RW to pull down her brief before sitting to the toilet; notably less steady without at least single UE support                            Cognition Arousal: Alert Behavior During Therapy: WFL for tasks assessed/performed Overall Cognitive Status: Within Functional Limits for tasks assessed                                 General Comments: Overall WFL, noting  incr impulsivity and less response to safety cues, like hold on to grab bar with at least one hand as she was about to sit to the 3in1 over the toilet        Exercises      General Comments General comments (skin integrity, edema, etc.): no dizziness or nausea this session      Pertinent Vitals/Pain Pain Assessment Pain Assessment: Faces Faces Pain Scale: No hurt Pain Intervention(s): Monitored during session    Home Living Family/patient expects to be discharged to:: Private residence Living Arrangements: Alone Available Help at Discharge: Family;Available PRN/intermittently Type of Home: Apartment Home Access: Level entry       Home Layout: One level Home Equipment: Grab bars - tub/shower;Grab bars - toilet;Shower seat;Rollator (4 wheels);Cane - single point;Hand held shower head Additional Comments: Apartment at Independent Living    Prior Function            PT Goals (current goals can now be found in the care plan section) Acute Rehab PT Goals Patient Stated Goal: Hopes to be able to get home PT Goal Formulation: With patient Time For Goal Achievement: 11/14/23 Potential to Achieve Goals: Good Progress towards PT goals: Progressing toward goals    Frequency    Min 1X/week      PT Plan      Co-evaluation              AM-PAC PT 6 Clicks Mobility   Outcome Measure  Help needed turning from your back to your side while in a flat bed without using bedrails?: A Little Help needed moving from lying on your back to sitting on the side of a flat bed without using bedrails?: A Little Help needed moving to and from a bed to a chair (including a wheelchair)?: A Lot Help needed standing up from a chair using your arms (e.g., wheelchair or bedside chair)?: A Little Help needed to walk in hospital room?: A Lot Help needed climbing 3-5 steps with a railing? : A Lot 6 Click Score: 15    End of Session Equipment Utilized During Treatment: Gait  belt Activity Tolerance: Patient tolerated treatment well Patient left: in bed;with call bell/phone within reach;with bed alarm set Nurse Communication: Mobility status PT Visit Diagnosis: Unsteadiness on feet (R26.81);Other abnormalities of gait and mobility (R26.89);History of falling (Z91.81);Muscle weakness (generalized) (M62.81)     Time: 8695-8654 PT Time Calculation (min) (ACUTE ONLY): 41 min  Charges:    $Gait Training: 23-37 mins $Therapeutic Activity: 8-22 mins PT General Charges $$ ACUTE PT VISIT: 1 Visit                     Silvano Currier, PT  Acute Rehabilitation Services Office [phone] Secure Chat welcomed    Silvano VEAR Currier 11/01/2023, 3:32 PM

## 2023-11-01 NOTE — Evaluation (Signed)
 Occupational Therapy Evaluation Patient Details Name: Kristen [name] MRN: [id number] DOB: 1933-08-07 Today's Date: 11/01/2023   History of Present Illness Kristen [name] is a 89 y.o. female was admitted after a fall resulting in R hip fx, s/p anterior THA for surgical fixation, WBAT; noteworthy that pt had weakness for teh weeks leading up to this fall, and 10 pound weight loss.  CT abdomen and pelvis was obtained which showed an enlarging right apical lung mass and right renal mass; with PMH significant for HTN, HLD, pulmonary hypertension, PE 2023 now on chronic Eliquis ,   Clinical Impression   PTA patient independent with mobility using rollator, independent ADLs and IADls. Admitted for above and presents with problem list below.  She requires min assist for bed mobility, transfers and functional mobility in room using RW.  Setup to mod assist for ADLs.  Anticipate she will progress well, she is highly motivated.  Based on performance today, believe pt will best benefit from continued OT services acutely and after dc at an inpatient setting with >3hrs/day to optimize independence, safety and return to PLOF with ADLs and mobility.        If plan is discharge home, recommend the following: A little help with walking and/or transfers;A lot of help with bathing/dressing/bathroom;Assistance with cooking/housework;Assist for transportation;Help with stairs or ramp for entrance    Functional Status Assessment  Patient has had a recent decline in their functional status and demonstrates the ability to make significant improvements in function in a reasonable and predictable amount of time.  Equipment Recommendations  BSC/3in1;Other (comment) (RW)    Recommendations for Other Services       Precautions / Restrictions Precautions Precautions: Fall Restrictions Weight Bearing Restrictions Per Provider Order: Yes RLE Weight Bearing Per Provider Order: Weight bearing as tolerated      Mobility Bed  Mobility Overal bed mobility: Needs Assistance Bed Mobility: Supine to Sit     Supine to sit: Min assist     General bed mobility comments: min assist to scoot foward    Transfers Overall transfer level: Needs assistance Equipment used: Rolling walker (2 wheels) Transfers: Sit to/from Stand Sit to Stand: Min assist           General transfer comment: cues for hand placement      Balance Overall balance assessment: Needs assistance   Sitting balance-Leahy Scale: Fair     Standing balance support: Bilateral upper extremity supported, During functional activity Standing balance-Leahy Scale: Poor Standing balance comment: relies on BUE support                           ADL either performed or assessed with clinical judgement   ADL                                               Vision   Vision Assessment?: No apparent visual deficits     Perception         Praxis         Pertinent Vitals/Pain Pain Assessment Pain Assessment: Faces Faces Pain Scale: No hurt Pain Intervention(s): Monitored during session     Extremity/Trunk Assessment Upper Extremity Assessment Upper Extremity Assessment: Generalized weakness   Lower Extremity Assessment Lower Extremity Assessment: Defer to PT evaluation   Cervical / Trunk Assessment Cervical / Trunk Assessment: Kyphotic  Communication Communication Communication: No apparent difficulties   Cognition Arousal: Alert Behavior During Therapy: WFL for tasks assessed/performed Overall Cognitive Status: Within Functional Limits for tasks assessed                                       General Comments  BP monitored, stable BP    Exercises     Shoulder Instructions      Home Living Family/patient expects to be discharged to:: Private residence Living Arrangements: Alone Available Help at Discharge: Family;Available PRN/intermittently Type of Home: Apartment Home  Access: Level entry     Home Layout: One level     Bathroom Shower/Tub: Producer, Television/film/video: Handicapped height Bathroom Accessibility: Yes   Home Equipment: Grab bars - tub/shower;Grab bars - toilet;Shower seat;Rollator (4 wheels);Cane - single point;Hand held shower head   Additional Comments: Apartment at Independent Living      Prior Functioning/Environment Prior Level of Function : Independent/Modified Independent             Mobility Comments: Walking with rollator vs cane, walks to/from teh dining hall; weakness was slowly increasing inthe weeks leading up to the fall ADLs Comments: manages ADLs, breakfast in her apt; manages meds and fiances        OT Problem List: Decreased strength;Decreased activity tolerance;Impaired balance (sitting and/or standing);Decreased knowledge of precautions;Decreased safety awareness;Decreased knowledge of use of DME or AE      OT Treatment/Interventions: Self-care/ADL training;Therapeutic exercise;DME and/or AE instruction;Therapeutic activities;Balance training;Patient/family education;Energy conservation    OT Goals(Current goals can be found in the care plan section) Acute Rehab OT Goals Patient Stated Goal: get better OT Goal Formulation: With patient Time For Goal Achievement: 11/15/23 Potential to Achieve Goals: Good  OT Frequency: Min 1X/week    Co-evaluation              AM-PAC OT 6 Clicks Daily Activity     Outcome Measure Help from another person eating meals?: None Help from another person taking care of personal grooming?: A Little Help from another person toileting, which includes using toliet, bedpan, or urinal?: A Little Help from another person bathing (including washing, rinsing, drying)?: A Lot Help from another person to put on and taking off regular upper body clothing?: A Little Help from another person to put on and taking off regular lower body clothing?: A Lot 6 Click Score: 17    End of Session Equipment Utilized During Treatment: Gait belt;Rolling walker (2 wheels) Nurse Communication: Mobility status  Activity Tolerance: Patient tolerated treatment well Patient left: in chair;with call bell/phone within reach;with chair alarm set;with family/visitor present  OT Visit Diagnosis: Other abnormalities of gait and mobility (R26.89);Muscle weakness (generalized) (M62.81);History of falling (Z91.81)                Time: 8876-8854 OT Time Calculation (min): 22 min Charges:  OT General Charges $OT Visit: 1 Visit OT Evaluation $OT Eval Moderate Complexity: 1 Mod  Etta NOVAK, OT Acute Rehabilitation Services Office [phone]   Etta GORMAN Hope 11/01/2023, 1:48 PM

## 2023-11-01 NOTE — Progress Notes (Signed)
 [facility] PROGRESS NOTE    Progress Note  Kristen [name]  FMW:[id number] DOB: Feb 04, 1933 DOA: 10/29/2023 PCP: Charlott Dorn LABOR, MD     Brief Narrative:   Kristen [name] is an 89 y.o. female past medical history significant for essential hypertension, hyperlipidemia pulmonary hypertension PE in 2023 now on Eliquis  who came into the ED on 10/28/2022 after mechanical fall imaging showed acute mildly displaced subcapital right femoral neck fracture orthopedic surgery was consulted   Assessment/Plan:   Closed right hip fracture, initial encounter [facility]) Orthopedic surgery was consulted and she is status post total hip arthroplasty on 10/29/2022. Continue analgesics per orthopedic surgery. Weightbearing and anticoagulation per orthopedic surgery. She was restarted on Eliquis . PT evaluated the patient, will need inpatient rehab.  Leukocytosis: Likely reactive.  History of PE: Continue Eliquis .  Essential hypertension: Restart and blood pressures trending up. Hydralazine  as needed for blood pressure greater than 1 160/100.  Right apical pulmonary mass CT of the chest showed a 3.6 cm lung mass. Per Dr. Shelah as an outpatient.  Right renal mass: CT of the abdomen pelvis on 10/28/2023 showed 2 cm renal mass. Will need an MRI as an outpatient.  GERD: Continue PPI.  Anxiety and depression: Is being weaned off Effexor  and continue on hydroxyzine  at night   DVT prophylaxis: eliquis  Family Communication:none Status is: Inpatient Remains inpatient appropriate because: Close right hip fracture    Code Status:     Code Status Orders  (From admission, onward)           Start     Ordered   10/29/23 1957  Do not attempt resuscitation (DNR) Pre-Arrest Interventions Desired  Continuous       Question Answer Comment  If pulseless and not breathing No CPR or chest compressions.   In Pre-Arrest Conditions (Patient Has Pulse and Is Breathing) May intubate, use advanced  airway interventions and cardioversion/ACLS medications if appropriate or indicated. May transfer to ICU.   Consent: Discussion documented in EHR or advanced directives reviewed      10/29/23 2000           Code Status History     Date Active Date Inactive Code Status Order ID Comments User Context   11/23/2021 1706 11/27/2021 0015 DNR [id number]  Barbarann Nest, MD ED         IV Access:   Peripheral IV   Procedures and diagnostic studies:   No results found.   Medical Consultants:   None.   Subjective:    Concepcion Gillott relates her pain is controlled had a bowel movement yesterday.  Objective:    Vitals:   11/01/23 0321 11/01/23 0722 11/01/23 0927 11/01/23 0954  BP: (!) 164/54 (!) 171/54    Pulse: 66 76    Resp: 16 17 16  (!) 21  Temp: 98.2 F (36.8 C) 98 F (36.7 C)    TempSrc:  Oral    SpO2: 99% 96%     SpO2: 96 % O2 Flow Rate (L/min): 2 L/min  No intake or output data in the 24 hours ending 11/01/23 1030 There were no vitals filed for this visit.  Exam: General exam: In no acute distress. Respiratory system: Good air movement and clear to auscultation. Cardiovascular system: S1 & S2 heard, RRR. No JVD. Gastrointestinal system: Abdomen is nondistended, soft and nontender.  Extremities: No pedal edema. Skin: No rashes, lesions or ulcers Psychiatry: Judgement and insight appear normal. Mood & affect appropriate.    Data Reviewed:    Labs:  Basic Metabolic Panel: Recent Labs  Lab 10/28/23 1550 10/29/23 1714 10/29/23 1745 10/30/23 0505 10/31/23 1737 11/01/23 0752  NA 132* 132* 133* 133* 128* 130*  K 4.1 4.0 4.1 4.4 4.4 4.8  CL 93* 95* 96* 95* 96* 95*  CO2 25 24  --  26 22 22   GLUCOSE 102* 136* 131* 152* 169* 123*  BUN 9 19 20 14 20 22   CREATININE 1.08* 1.20* 1.20* 1.20* 1.23* 1.07*  CALCIUM  8.8* 9.4  --  9.1 8.2* 8.8*   GFR Estimated Creatinine Clearance: 28.7 mL/min (A) (by C-G formula based on SCr of 1.07 mg/dL (H)). Liver  Function Tests: Recent Labs  Lab 10/28/23 1550 10/29/23 1714 10/30/23 0505  AST 18 24 21   ALT 10 20 16   ALKPHOS 69 78 68  BILITOT 0.8 0.9 1.2  PROT 6.3* 7.4 6.6  ALBUMIN  3.7 4.5 3.9   Recent Labs  Lab 10/28/23 1550  LIPASE 30   No results for input(s): AMMONIA in the last 168 hours. Coagulation profile Recent Labs  Lab 10/29/23 1714  INR 1.5*   COVID-19 Labs  No results for input(s): DDIMER, FERRITIN, LDH, CRP in the last 72 hours.  Lab Results  Component Value Date   SARSCOV2NAA NEGATIVE 05/13/2022   SARSCOV2NAA NEGATIVE 11/23/2021    CBC: Recent Labs  Lab 10/28/23 1550 10/29/23 1714 10/29/23 1745 10/30/23 0505 10/31/23 1737 11/01/23 0752  WBC 7.3 21.0*  --  12.3* 10.0 11.7*  NEUTROABS 4.5  --   --  11.0*  --  10.3*  HGB 12.5 13.7 13.9 13.4 9.5* 10.2*  HCT 35.3* 39.9 41.0 39.1 27.5* 28.9*  MCV 92.4 92.8  --  94.2 94.2 93.8  PLT 248 259  --  203 171 182   Cardiac Enzymes: No results for input(s): CKTOTAL, CKMB, CKMBINDEX, TROPONINI in the last 168 hours. BNP (last 3 results) No results for input(s): PROBNP in the last 8760 hours. CBG: No results for input(s): GLUCAP in the last 168 hours. D-Dimer: No results for input(s): DDIMER in the last 72 hours. Hgb A1c: No results for input(s): HGBA1C in the last 72 hours. Lipid Profile: No results for input(s): CHOL, HDL, LDLCALC, TRIG, CHOLHDL, LDLDIRECT in the last 72 hours. Thyroid  function studies: No results for input(s): TSH, T4TOTAL, T3FREE, THYROIDAB in the last 72 hours.  Invalid input(s): FREET3 Anemia work up: No results for input(s): VITAMINB12, FOLATE, FERRITIN, TIBC, IRON, RETICCTPCT in the last 72 hours. Sepsis Labs: Recent Labs  Lab 10/29/23 1714 10/29/23 1745 10/30/23 0505 10/31/23 1737 11/01/23 0752  WBC 21.0*  --  12.3* 10.0 11.7*  LATICACIDVEN  --  1.2  --   --   --    Microbiology Recent Results (from the past 240  hours)  Surgical pcr screen     Status: None   Collection Time: 10/30/23  8:05 AM   Specimen: Nasal Mucosa; Nasal Swab  Result Value Ref Range Status   MRSA, PCR NEGATIVE NEGATIVE Final   Staphylococcus aureus NEGATIVE NEGATIVE Final    Comment: (NOTE) The Xpert SA Assay (FDA approved for NASAL specimens in patients 73 years of age and older), is one component of a comprehensive surveillance program. It is not intended to diagnose infection nor to guide or monitor treatment. Performed at [facility], [address] [place], [place] [address]      Medications:    acetaminophen   1,000 mg Oral TID   apixaban   5 mg Oral BID   atorvastatin   20 mg  Oral Daily   gabapentin   300 mg Oral BID   HYDROmorphone   2 mg Oral TID   hydrOXYzine   25 mg Oral QHS   pantoprazole   80 mg Oral Q1200   polyethylene glycol  17 g Oral BID   senna  2 tablet Oral QHS   sodium chloride  flush  3-10 mL Intravenous Q12H   venlafaxine   25 mg Oral BID WC   Continuous Infusions:    LOS: 3 days   Erle Odell Castor  [facility]  11/01/2023, 10:30 AM

## 2023-11-01 NOTE — Progress Notes (Addendum)
 Initial Nutrition Assessment  DOCUMENTATION CODES:   Not applicable  INTERVENTION:   -Provide dysphagia III diet for ease of chewing. Encourage intakes.  -Provide tray set up, including cut up all meals.  -Boost Plus 237 mL PO BID, each carton provides 360 calories, 14 gm protein.  -Consider checking vitamin D level.   NUTRITION DIAGNOSIS:   Increased nutrient needs related to post-op healing, hip fracture as evidenced by estimated needs.   GOAL:   Patient will meet greater than or equal to 90% of their needs    MONITOR:   PO intake, Weight trends, Supplement acceptance, Skin, Labs  REASON FOR ASSESSMENT:   Consult Assessment of nutrition requirement/status  ASSESSMENT: 89 y/o female presented to the ED after a mechanical fall at home. Admitted with closed right hip fracture. PMH: HTN, PAH, PE, anemia, vitamin B12 deficiency, chronic pain disorder, trigeminal neuralgia, depression, dyslipidemia, seizure disorder, tremor, cholecystectomy, umbilical hernia repair.  01/04-right total hip replacement due to displaced femoral fracture.    Patient lives at an assisted living facility where meals are provided. She has been consuming one meal daily.  She does care for the meals there, the menu nor the way in which they are prepared. Noted weight loss due to severe nausea back in August 2024 which resolved however a medication she was started on one month ago, Effexor , per daughter at bedside, also caused nausea which is now being weaned.  Patient states her appetite is fine, no changes. Endorses chewing difficulty with missing teeth upper right side.  She would like to receive a easy to chew food selections. Also informs she occasionally has a feeling of phlegm causing food to get stuck in lower esophagus although has not been a current issue, it happens intermittently. She does not think she needs a swallow evaluation at this time. No coughing on thin liquids noted.  Her daughter  has been ordering the meals and cutting up her food, patient has difficulty due to tremor. They would like to receive automatic trays.  Stated usual weight 145# which she weighed about two years ago before moving into the facility. Per review of EMR, 14% weight loss from 11/23/2021.   Medications reviewed and include PPI, Miralax , senna.   Labs reviewed  NUTRITION - FOCUSED PHYSICAL EXAM: Note: muscle loss may be related to age and sedentary lifestyle rather than nutritional.  Flowsheet Row Most Recent Value  Orbital Region No depletion  Upper Arm Region No depletion  Thoracic and Lumbar Region No depletion  Buccal Region No depletion  Temple Region Moderate depletion  Clavicle Bone Region Mild depletion  Clavicle and Acromion Bone Region Mild depletion  Scapular Bone Region Mild depletion  Dorsal Hand Moderate depletion  Patellar Region Mild depletion  Anterior Thigh Region Moderate depletion  Posterior Calf Region Moderate depletion  Edema (RD Assessment) None  Hair Reviewed  Eyes Reviewed  Mouth Reviewed  Skin Reviewed  Nails Reviewed       Diet Order:   Diet Order             DIET DYS 3 Room service appropriate? No; Fluid consistency: Thin  Diet effective now                   EDUCATION NEEDS:   Education needs have been addressed  Skin:  Skin Assessment: Skin Integrity Issues: Skin Integrity Issues:: Incisions Incisions: R hip  Last BM:  01/05/205, type 2-small  Height:   Ht Readings from Last 1 Encounters:  10/28/23 5' 2 (1.575 m)    Weight:   Wt Readings from Last 1 Encounters:  10/28/23 56.7 kg    Ideal Body Weight:  52.3 kg  BMI:  There is no height or weight on file to calculate BMI.  Estimated Nutritional Needs:   Kcal:  1350-1600 kcal/day  Protein:  68-78 gm/day  Fluid:  1600 mL/day    Kristen [name], RDLD Clinical Dietitian If unable to reach, please contact RD Inpatient secure chat group between 8 am-4 pm daily

## 2023-11-01 NOTE — Progress Notes (Signed)
 Presents after a fall. Suffered R hip fx.         - S/P Right total hip replacement 1/4   11/01/23 1217  TOC Brief Assessment  Insurance and Status Reviewed  Patient has primary care physician Yes  Home environment has been reviewed Resides at [facility] IL[facility]. Lives alone.  Prior level of function: PTA independent with ADL's. Owns RW.  Prior/Current Home Services No current home services  Social Drivers of Health Review SDOH reviewed no interventions necessary  Readmission risk has been reviewed No  Transition of care needs transition of care needs identified, TOC will continue to follow (Recommendations for AIR.)   TOC team following and will assist with needs... Jon Hoit RN,BSN,CM [phone]

## 2023-11-02 ENCOUNTER — Telehealth: Payer: Self-pay | Admitting: Pharmacist

## 2023-11-02 DIAGNOSIS — S72001A Fracture of unspecified part of neck of right femur, initial encounter for closed fracture: Secondary | ICD-10-CM | POA: Diagnosis not present

## 2023-11-02 MED ORDER — LOSARTAN POTASSIUM 50 MG PO TABS
50.0000 mg | ORAL_TABLET | Freq: Every day | ORAL | Status: DC
  Administered 2023-11-03: 50 mg via ORAL
  Filled 2023-11-02: qty 1

## 2023-11-02 NOTE — Plan of Care (Signed)
  Problem: Clinical Measurements: Goal: Ability to maintain clinical measurements within normal limits will improve Outcome: Progressing Goal: Will remain free from infection Outcome: Progressing   Problem: Activity: Goal: Risk for activity intolerance will decrease Outcome: Progressing   Problem: Nutrition: Goal: Adequate nutrition will be maintained Outcome: Progressing   Problem: Elimination: Goal: Will not experience complications related to bowel motility Outcome: Progressing Goal: Will not experience complications related to urinary retention Outcome: Progressing   Problem: Pain Management: Goal: General experience of comfort will improve Outcome: Progressing   Problem: Safety: Goal: Ability to remain free from injury will improve Outcome: Progressing   Problem: Skin Integrity: Goal: Risk for impaired skin integrity will decrease Outcome: Progressing

## 2023-11-02 NOTE — Progress Notes (Signed)
 [facility] PROGRESS NOTE    Progress Note  Kristen [name]  FMW:[id number] DOB: [birth year] DOA: 10/29/2023 PCP: Charlott Dorn LABOR, MD     Brief Narrative:   Kristen [name] is an 89 y.o. female past medical history significant for essential hypertension, hyperlipidemia pulmonary hypertension PE in 2023 now on Eliquis  who came into the ED on 10/28/2022 after mechanical fall imaging showed acute mildly displaced subcapital right femoral neck fracture orthopedic surgery was consulted   Assessment/Plan:   Closed right hip fracture, initial encounter [facility]) Orthopedic surgery was consulted and she is status post total hip arthroplasty on 10/29/2022. Continue analgesics per orthopedic surgery. Weightbearing and anticoagulation per orthopedic surgery. She was restarted on Eliquis . PT evaluated the patient, inpatient rehab evaluated the patient does not appear to demonstrate sufficient medical complexity to get approval. Will need skilled nursing facility placement.  Leukocytosis: Likely reactive.  History of PE: Continue Eliquis .  Essential hypertension: Pressures rising, increase Cozaar  Hydralazine  as needed for blood pressure greater than 160/100.  Right apical pulmonary mass CT of the chest showed a 3.6 cm lung mass. Per Dr. Shelah as an outpatient.  Right renal mass: CT of the abdomen pelvis on 10/28/2023 showed 2 cm renal mass. Will need an MRI as an outpatient.  GERD: Continue PPI.  Anxiety and depression: Is being weaned off Effexor  and continue on hydroxyzine  at night   DVT prophylaxis: eliquis  Family Communication:none Status is: Inpatient Remains inpatient appropriate because: Close right hip fracture, will SNF    Code Status:     Code Status Orders  (From admission, onward)           Start     Ordered   10/29/23 1957  Do not attempt resuscitation (DNR) Pre-Arrest Interventions Desired  Continuous       Question Answer Comment  If pulseless and not  breathing No CPR or chest compressions.   In Pre-Arrest Conditions (Patient Has Pulse and Is Breathing) May intubate, use advanced airway interventions and cardioversion/ACLS medications if appropriate or indicated. May transfer to ICU.   Consent: Discussion documented in EHR or advanced directives reviewed      10/29/23 2000           Code Status History     Date Active Date Inactive Code Status Order ID Comments User Context   11/23/2021 1706 11/27/2021 0015 DNR [id number]  Barbarann Nest, MD ED         IV Access:   Peripheral IV   Procedures and diagnostic studies:   No results found.   Medical Consultants:   None.   Subjective:    Darby Fleeman pain is controlled had a bowel movement.   Objective:    Vitals:   11/01/23 1506 11/01/23 2040 11/02/23 0533 11/02/23 0858  BP: (!) 119/46 (!) 160/52 (!) 180/49 (!) 151/47  Pulse: 65 (!) 42 86 66  Resp: 14 18 18 16   Temp: 98 F (36.7 C) 98.1 F (36.7 C) 98.1 F (36.7 C) 98 F (36.7 C)  TempSrc: Oral Oral Oral Oral  SpO2: 96% 97% 98% 100%   SpO2: 100 % O2 Flow Rate (L/min): 2 L/min   Intake/Output Summary (Last 24 hours) at 11/02/2023 1017 Last data filed at 11/01/2023 1110 Gross per 24 hour  Intake --  Output 600 ml  Net -600 ml   There were no vitals filed for this visit.  Exam: General exam: In no acute distress. Respiratory system: Good air movement and clear to auscultation. Cardiovascular system: S1 &  S2 heard, RRR. No JVD. Gastrointestinal system: Abdomen is nondistended, soft and nontender.  Extremities: No pedal edema. Skin: No rashes, lesions or ulcers Psychiatry: Judgement and insight appear normal. Mood & affect appropriate.  Data Reviewed:    Labs: Basic Metabolic Panel: Recent Labs  Lab 10/28/23 1550 10/29/23 1714 10/29/23 1745 10/30/23 0505 10/31/23 1737 11/01/23 0752  NA 132* 132* 133* 133* 128* 130*  K 4.1 4.0 4.1 4.4 4.4 4.8  CL 93* 95* 96* 95* 96* 95*  CO2 25 24  --   26 22 22   GLUCOSE 102* 136* 131* 152* 169* 123*  BUN 9 19 20 14 20 22   CREATININE 1.08* 1.20* 1.20* 1.20* 1.23* 1.07*  CALCIUM  8.8* 9.4  --  9.1 8.2* 8.8*   GFR Estimated Creatinine Clearance: 28.7 mL/min (A) (by C-G formula based on SCr of 1.07 mg/dL (H)). Liver Function Tests: Recent Labs  Lab 10/28/23 1550 10/29/23 1714 10/30/23 0505  AST 18 24 21   ALT 10 20 16   ALKPHOS 69 78 68  BILITOT 0.8 0.9 1.2  PROT 6.3* 7.4 6.6  ALBUMIN  3.7 4.5 3.9   Recent Labs  Lab 10/28/23 1550  LIPASE 30   No results for input(s): AMMONIA in the last 168 hours. Coagulation profile Recent Labs  Lab 10/29/23 1714  INR 1.5*   COVID-19 Labs  No results for input(s): DDIMER, FERRITIN, LDH, CRP in the last 72 hours.  Lab Results  Component Value Date   SARSCOV2NAA NEGATIVE 05/13/2022   SARSCOV2NAA NEGATIVE 11/23/2021    CBC: Recent Labs  Lab 10/28/23 1550 10/29/23 1714 10/29/23 1745 10/30/23 0505 10/31/23 1737 11/01/23 0752  WBC 7.3 21.0*  --  12.3* 10.0 11.7*  NEUTROABS 4.5  --   --  11.0*  --  10.3*  HGB 12.5 13.7 13.9 13.4 9.5* 10.2*  HCT 35.3* 39.9 41.0 39.1 27.5* 28.9*  MCV 92.4 92.8  --  94.2 94.2 93.8  PLT 248 259  --  203 171 182   Cardiac Enzymes: No results for input(s): CKTOTAL, CKMB, CKMBINDEX, TROPONINI in the last 168 hours. BNP (last 3 results) No results for input(s): PROBNP in the last 8760 hours. CBG: No results for input(s): GLUCAP in the last 168 hours. D-Dimer: No results for input(s): DDIMER in the last 72 hours. Hgb A1c: No results for input(s): HGBA1C in the last 72 hours. Lipid Profile: No results for input(s): CHOL, HDL, LDLCALC, TRIG, CHOLHDL, LDLDIRECT in the last 72 hours. Thyroid  function studies: No results for input(s): TSH, T4TOTAL, T3FREE, THYROIDAB in the last 72 hours.  Invalid input(s): FREET3 Anemia work up: No results for input(s): VITAMINB12, FOLATE, FERRITIN, TIBC,  IRON, RETICCTPCT in the last 72 hours. Sepsis Labs: Recent Labs  Lab 10/29/23 1714 10/29/23 1745 10/30/23 0505 10/31/23 1737 11/01/23 0752  WBC 21.0*  --  12.3* 10.0 11.7*  LATICACIDVEN  --  1.2  --   --   --    Microbiology Recent Results (from the past 240 hours)  Surgical pcr screen     Status: None   Collection Time: 10/30/23  8:05 AM   Specimen: Nasal Mucosa; Nasal Swab  Result Value Ref Range Status   MRSA, PCR NEGATIVE NEGATIVE Final   Staphylococcus aureus NEGATIVE NEGATIVE Final    Comment: (NOTE) The Xpert SA Assay (FDA approved for NASAL specimens in patients 46 years of age and older), is one component of a comprehensive surveillance program. It is not intended to diagnose infection nor to guide or monitor treatment. Performed  at [facility], [address] [place], [place] [address]      Medications:    acetaminophen   1,000 mg Oral TID   apixaban   5 mg Oral BID   atorvastatin   20 mg Oral Daily   gabapentin   300 mg Oral BID   HYDROmorphone   2 mg Oral TID   hydrOXYzine   25 mg Oral QHS   lactose free nutrition  237 mL Oral BID BM   losartan   25 mg Oral Daily   pantoprazole   80 mg Oral Q1200   polyethylene glycol  17 g Oral BID   senna  2 tablet Oral QHS   sodium chloride  flush  3-10 mL Intravenous Q12H   venlafaxine   25 mg Oral BID WC   Continuous Infusions:    LOS: 4 days   Erle Odell Castor  [facility]  11/02/2023, 10:17 AM

## 2023-11-02 NOTE — Plan of Care (Signed)

## 2023-11-02 NOTE — NC FL2 (Signed)
 [place]  MEDICAID FL2 LEVEL OF CARE FORM     IDENTIFICATION  Patient Name: Kristen [name] Birthdate: 05-07-1933 Sex: female Admission Date (Current Location): 10/29/2023  [facility] and [place] Number:  Producer, Television/film/video and Address:         Provider Number: [id number]  Attending Physician Name and Address:  Odell Celinda Balo, MD  Relative Name and Phone Number:       Current Level of Care: Hospital Recommended Level of Care: Skilled Nursing Facility Prior Approval Number:    Date Approved/Denied:   PASRR Number: [id number]  Discharge Plan: SNF    Current Diagnoses: Patient Active Problem List   Diagnosis Date Noted   S/P total right hip arthroplasty 10/30/2023   Closed right hip fracture, initial encounter (HCC) 10/29/2023   History of pulmonary embolism 10/29/2023   Renal mass 10/29/2023   Leukocytosis 10/29/2023   DOE (dyspnea on exertion) 05/20/2022   Chronic rhinitis 05/20/2022   Dysphagia 05/20/2022   Pulmonary embolism (HCC) 11/26/2021   Acute pulmonary embolism without acute cor pulmonale (HCC) 11/23/2021   Pulmonary mass 11/23/2021   Chronic pain disorder 11/23/2021   Stage 3b chronic kidney disease (CKD) (HCC) 11/23/2021   DNR (do not resuscitate) 11/23/2021   Tricuspid regurgitation 10/07/2021   Pulmonary hypertension, unspecified (HCC) 10/07/2021   Hyperlipidemia 10/07/2021   Essential hypertension 10/07/2021   Atypical chest pain 10/07/2021    Orientation RESPIRATION BLADDER Height & Weight     Self, Time, Situation  Normal Continent Weight:   Height:     BEHAVIORAL SYMPTOMS/MOOD NEUROLOGICAL BOWEL NUTRITION STATUS      Continent Diet (refer to d/c summary)  AMBULATORY STATUS COMMUNICATION OF NEEDS Skin   Extensive Assist Verbally Normal (S/P Right total hip replacement, 10/30/2023)                       Personal Care Assistance Level of Assistance  Bathing, Feeding, Dressing Bathing Assistance: Limited assistance Feeding  assistance: Independent Dressing Assistance: Limited assistance     Functional Limitations Info  Sight, Hearing, Speech Sight Info: Adequate Hearing Info: Adequate Speech Info: Adequate    SPECIAL CARE FACTORS FREQUENCY  PT (By licensed PT), OT (By licensed OT)     PT Frequency: 5x/week , evaluate and treat OT Frequency: 5x/week , evaluate and treat            Contractures Contractures Info: Not present    Additional Factors Info  Allergies, Code Status Code Status Info: DNR Allergies Info: Penicillins, Tetracyclines & Related, Sulfa Antibiotics           Current Medications (11/02/2023):  This is the current hospital active medication list Current Facility-Administered Medications  Medication Dose Route Frequency Provider Last Rate Last Admin   acetaminophen  (TYLENOL ) tablet 1,000 mg  1,000 mg Oral TID Arlice Reichert, MD   1,000 mg at 11/02/23 0909   alum & mag hydroxide-simeth (MAALOX/MYLANTA) 200-200-20 MG/5ML suspension 30 mL  30 mL Oral Q4H PRN Patti Rosina SAUNDERS, PA-C       apixaban  (ELIQUIS ) tablet 5 mg  5 mg Oral BID Patti Rosina SAUNDERS, PA-C   5 mg at 11/02/23 0907   atorvastatin  (LIPITOR) tablet 20 mg  20 mg Oral Daily Patti Rosina SAUNDERS, PA-C   20 mg at 11/02/23 9092   bisacodyl  (DULCOLAX) suppository 10 mg  10 mg Rectal Daily PRN Patti Rosina SAUNDERS, PA-C       diphenhydrAMINE  (BENADRYL ) 12.5 MG/5ML elixir 12.5-25 mg  12.5-25 mg Oral  Q4H PRN Patti Rosina SAUNDERS, PA-C   25 mg at 11/01/23 2200   gabapentin  (NEURONTIN ) capsule 300 mg  300 mg Oral BID Donovan, Ashley R, PA-C   300 mg at 11/02/23 9092   hydrALAZINE  (APRESOLINE ) tablet 25 mg  25 mg Oral Q6H PRN Odell Celinda Balo, MD       HYDROmorphone  (DILAUDID ) tablet 2 mg  2 mg Oral TID Dahal, Binaya, MD   2 mg at 11/02/23 0907   hydrOXYzine  (ATARAX ) tablet 25 mg  25 mg Oral QHS Dahal, Binaya, MD   25 mg at 11/01/23 2158   labetalol  (NORMODYNE ) injection 5-10 mg  5-10 mg Intravenous Q2H PRN Patti Rosina SAUNDERS, PA-C   5 mg  at 10/30/23 0110   lactose free nutrition (BOOST PLUS) liquid 237 mL  237 mL Oral BID BM Odell Celinda Balo, MD   237 mL at 11/02/23 0909   [START ON 11/03/2023] losartan  (COZAAR ) tablet 50 mg  50 mg Oral Daily Odell Celinda Balo, MD       menthol -cetylpyridinium (CEPACOL) lozenge 3 mg  1 lozenge Oral PRN Patti Rosina SAUNDERS, PA-C       Or   phenol (CHLORASEPTIC) mouth spray 1 spray  1 spray Mouth/Throat PRN Patti Rosina SAUNDERS, PA-C       methocarbamol  (ROBAXIN ) tablet 500 mg  500 mg Oral Q6H PRN Patti Rosina SAUNDERS, PA-C       Or   methocarbamol  (ROBAXIN ) injection 500 mg  500 mg Intravenous Q6H PRN Patti Rosina SAUNDERS, PA-C   500 mg at 10/30/23 1131   metoCLOPramide  (REGLAN ) tablet 5-10 mg  5-10 mg Oral Q8H PRN Patti Rosina SAUNDERS, PA-C       Or   metoCLOPramide  (REGLAN ) injection 5-10 mg  5-10 mg Intravenous Q8H PRN Patti Rosina SAUNDERS, PA-C       oxyCODONE  (Oxy IR/ROXICODONE ) immediate release tablet 2.5-5 mg  2.5-5 mg Oral Q4H PRN Patti Rosina SAUNDERS, PA-C   5 mg at 10/30/23 1439   oxyCODONE  (Oxy IR/ROXICODONE ) immediate release tablet 5-10 mg  5-10 mg Oral Q4H PRN Patti Rosina SAUNDERS, PA-C       pantoprazole  (PROTONIX ) EC tablet 80 mg  80 mg Oral Q1200 Patti Rosina SAUNDERS, PA-C   80 mg at 11/01/23 1109   polyethylene glycol (MIRALAX  / GLYCOLAX ) packet 17 g  17 g Oral BID Patti Rosina SAUNDERS, PA-C   17 g at 11/01/23 2158   senna (SENOKOT) tablet 17.2 mg  2 tablet Oral QHS Patti Rosina SAUNDERS, PA-C   17.2 mg at 11/01/23 2158   sodium chloride  flush (NS) 0.9 % injection 3-10 mL  3-10 mL Intravenous Q12H Patti Rosina SAUNDERS, PA-C   3 mL at 11/02/23 9090   sodium chloride  flush (NS) 0.9 % injection 3-10 mL  3-10 mL Intravenous PRN Patti Rosina SAUNDERS, PA-C       venlafaxine  (EFFEXOR ) tablet 25 mg  25 mg Oral BID WC Dahal, Binaya, MD   25 mg at 11/02/23 0908     Discharge Medications: Please see discharge summary for a list of discharge medications.  Relevant Imaging Results:  Relevant Lab  Results:   Additional Information SS# [id number] [place], [place]

## 2023-11-02 NOTE — Progress Notes (Signed)
 Mobility Specialist: Progress Note   11/02/23 1600  Mobility  Activity Ambulated with assistance in hallway  Level of Assistance Contact guard assist, steadying assist  Assistive Device Front wheel walker  Distance Ambulated (ft) 200 ft  RLE Weight Bearing Per Provider Order WBAT  Activity Response Tolerated well  Mobility Referral Yes  Mobility visit 1 Mobility  Mobility Specialist Start Time (ACUTE ONLY) 1440  Mobility Specialist Stop Time (ACUTE ONLY) 1449  Mobility Specialist Time Calculation (min) (ACUTE ONLY) 9 min    Pt was agreeable to mobility session - received in chair. Stated pain rated 3-4/10 but overall okay. Cg throughout. Returned to room without fault. Left in chair with all needs met, call bell in reach. Chair alarm on.    Ileana Lute Mobility Specialist Please contact via SecureChat or Rehab office at [phone]

## 2023-11-02 NOTE — Progress Notes (Signed)
   11/02/2023  Patient ID: Ronnald Centers, female   DOB: [birth year], 89 y.o.   MRN: [id number]  Spoke with daughter Devere today who requested to re-schedule Inez's home visit for medication reconciliation due to hospitalization recently. Believe she will be out of PT and rehab after her hip surgery in 2 weeks potentially.   Requested I call back in 2 weeks. Scheduled to call and see about next steps then.   Aloysius Breeding, PharmD [facility] Phone Number: [phone]

## 2023-11-02 NOTE — TOC Initial Note (Signed)
 Transition of Care [facility]) - Initial/Assessment Note    Patient Details  Name: Kristen [name] MRN: [id number] Date of Birth: 11/27/1932  Transition of [facility]) CM/SW Contact:    Rosalva Jon Bloch, RN Phone Number: 11/02/2023, 12:26 PM  Clinical Narrative:                  Presents after a fall. Suffered R hip fx.         - S/P Right total hip replacement 1/4 Pt screened for CIR per therapy recommendation, not a candidate 2/2 not appearing  to demonstrate sufficient medical complexity to gain approval from [facility].  Pt now agreeable to SNF placement. Preference: [facility].  PTA pt lived alone @ [address] [facility]. Pt reported that she is currently unable to care for self independently at home given patient's current physical needs and fall risk. RNCM discussed insurance authorization process and  will provide Medicare SNF ratings list.  No further questions reported at this time.  TOC team to continue to follow and assist with discharge planning needs.   Expected Discharge Plan: Skilled Nursing Facility Barriers to Discharge: Continued Medical Work up   Patient Goals and CMS Choice   CMS Medicare.gov Compare Post Acute Care list provided to:: Patient        Expected Discharge Plan and Services       Living arrangements for the past 2 months: Independent Living Facility                                      Prior Living Arrangements/Services Living arrangements for the past 2 months: Independent Living Facility Lives with:: Self Patient language and need for interpreter reviewed:: Yes Do you feel safe going back to the place where you live?: Yes      Need for Family Participation in Patient Care: Yes (Comment) Care giver support system in place?: Yes (comment)   Criminal Activity/Legal Involvement Pertinent to Current Situation/Hospitalization: No - Comment as needed  Activities of Daily Living      Permission Sought/Granted   Permission granted to  share information with : Yes, Verbal Permission Granted  Share Information with NAME: Devere Hof  Daughter   [phone]           Emotional Assessment   Attitude/Demeanor/Rapport: Engaged   Orientation: : Oriented to Self, Oriented to Place, Oriented to  Time, Oriented to Situation Alcohol / Substance Use: Not Applicable Psych Involvement: No (comment)  Admission diagnosis:  Closed fracture of neck of right femur, initial encounter (HCC) [S72.001A] Closed right hip fracture, initial encounter (HCC) [S72.001A] Patient Active Problem List   Diagnosis Date Noted   S/P total right hip arthroplasty 10/30/2023   Closed right hip fracture, initial encounter (HCC) 10/29/2023   History of pulmonary embolism 10/29/2023   Renal mass 10/29/2023   Leukocytosis 10/29/2023   DOE (dyspnea on exertion) 05/20/2022   Chronic rhinitis 05/20/2022   Dysphagia 05/20/2022   Pulmonary embolism (HCC) 11/26/2021   Acute pulmonary embolism without acute cor pulmonale (HCC) 11/23/2021   Pulmonary mass 11/23/2021   Chronic pain disorder 11/23/2021   Stage 3b chronic kidney disease (CKD) (HCC) 11/23/2021   DNR (do not resuscitate) 11/23/2021   Tricuspid regurgitation 10/07/2021   Pulmonary hypertension, unspecified (HCC) 10/07/2021   Hyperlipidemia 10/07/2021   Essential hypertension 10/07/2021   Atypical chest pain 10/07/2021   PCP:  Charlott Dorn LABOR, MD Pharmacy:  [facility] [address] [place] [address] Phone: [phone] Fax: [phone]  [facility] [place], [place] - [address] [place] [address] Phone: [phone] Fax: [phone]     Social Drivers of Health (SDOH) Social History: SDOH Screenings   Food Insecurity: No Food Insecurity (10/30/2023)  Housing: Low Risk  (10/30/2023)  Transportation Needs: No Transportation Needs (10/30/2023)  Utilities: Not At Risk (10/30/2023)   Financial Resource Strain: Low Risk  (06/19/2019)   [facility] from [facility], [facility]  Social Connections: Socially Isolated (10/30/2023)  Stress: No Stress Concern Present (06/19/2019)   [facility] from [facility], [facility]  Tobacco Use: Low Risk  (10/30/2023)  Health Literacy: Medium Risk (02/01/2021)   [facility] from [facility], [facility]   SDOH Interventions:     Readmission Risk Interventions     No data to display

## 2023-11-03 DIAGNOSIS — S72001A Fracture of unspecified part of neck of right femur, initial encounter for closed fracture: Secondary | ICD-10-CM | POA: Diagnosis not present

## 2023-11-03 LAB — URINALYSIS, ROUTINE W REFLEX MICROSCOPIC
Bilirubin Urine: NEGATIVE
Glucose, UA: NEGATIVE mg/dL
Hgb urine dipstick: NEGATIVE
Ketones, ur: NEGATIVE mg/dL
Leukocytes,Ua: NEGATIVE
Nitrite: NEGATIVE
Protein, ur: NEGATIVE mg/dL
Specific Gravity, Urine: 1.011 (ref 1.005–1.030)
pH: 7 (ref 5.0–8.0)

## 2023-11-03 MED ORDER — LOSARTAN POTASSIUM 50 MG PO TABS
100.0000 mg | ORAL_TABLET | Freq: Every day | ORAL | Status: DC
  Administered 2023-11-04: 100 mg via ORAL
  Filled 2023-11-03: qty 2

## 2023-11-03 MED ORDER — AMLODIPINE BESYLATE 5 MG PO TABS
5.0000 mg | ORAL_TABLET | Freq: Every day | ORAL | Status: DC
  Administered 2023-11-03 – 2023-11-04 (×2): 5 mg via ORAL
  Filled 2023-11-03 (×2): qty 1

## 2023-11-03 NOTE — Progress Notes (Signed)
 [facility] PROGRESS NOTE    Progress Note  Kristen [name]  FMW:[id number] DOB: [birth year] DOA: 10/29/2023 PCP: Charlott Dorn LABOR, MD     Brief Narrative:   Kristen [name] is an 89 y.o. female past medical history significant for essential hypertension, hyperlipidemia pulmonary hypertension PE in 2023 now on Eliquis  who came into the ED on 10/28/2022 after mechanical fall imaging showed acute mildly displaced subcapital right femoral neck fracture orthopedic surgery was consulted.  Assessment/Plan:   Closed right hip fracture, initial encounter [facility]) Orthopedic surgery was consulted and she is status post total hip arthroplasty on 10/29/2022. Continue analgesics per orthopedic surgery. Weightbearing per orthopedic surgery. She was restarted on Eliquis . PT evaluated the patient, inpatient rehab evaluated the patient does not appear to demonstrate sufficient medical complexity to get approval. Will need skilled nursing facility placement. Hold bowel regimen she has had 3 bowel movements overnight.  Leukocytosis: Likely reactive.  History of PE: Continue Eliquis .  Essential hypertension: Blood pressure continues to be elevated continue Cozaar  at low-dose Norvasc .  Right apical pulmonary mass CT of the chest showed a 3.6 cm lung mass. Was previously seen by Dr. Stacy as an outpatient recommended follow-up as an outpatient.  Right renal mass: CT of the abdomen pelvis on 10/28/2023 showed 2 cm renal mass. Will need an MRI as an outpatient.  GERD: Continue PPI.  Anxiety and depression: Is being weaned off Effexor  and continue on hydroxyzine  at night   DVT prophylaxis: eliquis  Family Communication:none Status is: Inpatient Remains inpatient appropriate because: Close right hip fracture, will SNF    Code Status:     Code Status Orders  (From admission, onward)           Start     Ordered   10/29/23 1957  Do not attempt resuscitation (DNR) Pre-Arrest  Interventions Desired  Continuous       Question Answer Comment  If pulseless and not breathing No CPR or chest compressions.   In Pre-Arrest Conditions (Patient Has Pulse and Is Breathing) May intubate, use advanced airway interventions and cardioversion/ACLS medications if appropriate or indicated. May transfer to ICU.   Consent: Discussion documented in EHR or advanced directives reviewed      10/29/23 2000           Code Status History     Date Active Date Inactive Code Status Order ID Comments User Context   11/23/2021 1706 11/27/2021 0015 DNR [id number]  Barbarann Nest, MD ED         IV Access:   Peripheral IV   Procedures and diagnostic studies:   No results found.   Medical Consultants:   None.   Subjective:    Kiva Norland pain is controlled, has had many bowel movements.  Objective:    Vitals:   11/02/23 1403 11/02/23 2100 11/03/23 0615 11/03/23 0712  BP: 119/73 120/78 (!) 176/63 (!) 169/66  Pulse: 79 88  64  Resp: 16 16 14 16   Temp: 99.6 F (37.6 C) 99.5 F (37.5 C) 98 F (36.7 C) 98 F (36.7 C)  TempSrc: Oral Oral    SpO2: 97% 98% 98% 100%   SpO2: 100 % O2 Flow Rate (L/min): 2 L/min   Intake/Output Summary (Last 24 hours) at 11/03/2023 0849 Last data filed at 11/02/2023 1700 Gross per 24 hour  Intake 717 ml  Output --  Net 717 ml   There were no vitals filed for this visit.  Exam: General exam: In no acute distress. Respiratory system: Good  air movement and clear to auscultation. Cardiovascular system: S1 & S2 heard, RRR. No JVD. Gastrointestinal system: Abdomen is nondistended, soft and nontender.  Extremities: No pedal edema. Skin: No rashes, lesions or ulcers Psychiatry: Judgement and insight appear normal. Mood & affect appropriate.  Data Reviewed:    Labs: Basic Metabolic Panel: Recent Labs  Lab 10/28/23 1550 10/29/23 1714 10/29/23 1745 10/30/23 0505 10/31/23 1737 11/01/23 0752  NA 132* 132* 133* 133* 128* 130*   K 4.1 4.0 4.1 4.4 4.4 4.8  CL 93* 95* 96* 95* 96* 95*  CO2 25 24  --  26 22 22   GLUCOSE 102* 136* 131* 152* 169* 123*  BUN 9 19 20 14 20 22   CREATININE 1.08* 1.20* 1.20* 1.20* 1.23* 1.07*  CALCIUM  8.8* 9.4  --  9.1 8.2* 8.8*   GFR Estimated Creatinine Clearance: 28.7 mL/min (A) (by C-G formula based on SCr of 1.07 mg/dL (H)). Liver Function Tests: Recent Labs  Lab 10/28/23 1550 10/29/23 1714 10/30/23 0505  AST 18 24 21   ALT 10 20 16   ALKPHOS 69 78 68  BILITOT 0.8 0.9 1.2  PROT 6.3* 7.4 6.6  ALBUMIN  3.7 4.5 3.9   Recent Labs  Lab 10/28/23 1550  LIPASE 30   No results for input(s): AMMONIA in the last 168 hours. Coagulation profile Recent Labs  Lab 10/29/23 1714  INR 1.5*   COVID-19 Labs  No results for input(s): DDIMER, FERRITIN, LDH, CRP in the last 72 hours.  Lab Results  Component Value Date   SARSCOV2NAA NEGATIVE 05/13/2022   SARSCOV2NAA NEGATIVE 11/23/2021    CBC: Recent Labs  Lab 10/28/23 1550 10/29/23 1714 10/29/23 1745 10/30/23 0505 10/31/23 1737 11/01/23 0752  WBC 7.3 21.0*  --  12.3* 10.0 11.7*  NEUTROABS 4.5  --   --  11.0*  --  10.3*  HGB 12.5 13.7 13.9 13.4 9.5* 10.2*  HCT 35.3* 39.9 41.0 39.1 27.5* 28.9*  MCV 92.4 92.8  --  94.2 94.2 93.8  PLT 248 259  --  203 171 182   Cardiac Enzymes: No results for input(s): CKTOTAL, CKMB, CKMBINDEX, TROPONINI in the last 168 hours. BNP (last 3 results) No results for input(s): PROBNP in the last 8760 hours. CBG: No results for input(s): GLUCAP in the last 168 hours. D-Dimer: No results for input(s): DDIMER in the last 72 hours. Hgb A1c: No results for input(s): HGBA1C in the last 72 hours. Lipid Profile: No results for input(s): CHOL, HDL, LDLCALC, TRIG, CHOLHDL, LDLDIRECT in the last 72 hours. Thyroid  function studies: No results for input(s): TSH, T4TOTAL, T3FREE, THYROIDAB in the last 72 hours.  Invalid input(s): FREET3 Anemia work  up: No results for input(s): VITAMINB12, FOLATE, FERRITIN, TIBC, IRON, RETICCTPCT in the last 72 hours. Sepsis Labs: Recent Labs  Lab 10/29/23 1714 10/29/23 1745 10/30/23 0505 10/31/23 1737 11/01/23 0752  WBC 21.0*  --  12.3* 10.0 11.7*  LATICACIDVEN  --  1.2  --   --   --    Microbiology Recent Results (from the past 240 hours)  Surgical pcr screen     Status: None   Collection Time: 10/30/23  8:05 AM   Specimen: Nasal Mucosa; Nasal Swab  Result Value Ref Range Status   MRSA, PCR NEGATIVE NEGATIVE Final   Staphylococcus aureus NEGATIVE NEGATIVE Final    Comment: (NOTE) The Xpert SA Assay (FDA approved for NASAL specimens in patients 37 years of age and older), is one component of a comprehensive surveillance program. It is not intended  to diagnose infection nor to guide or monitor treatment. Performed at [facility], [address] [place], [place] [address]      Medications:    acetaminophen   1,000 mg Oral TID   apixaban   5 mg Oral BID   atorvastatin   20 mg Oral Daily   gabapentin   300 mg Oral BID   HYDROmorphone   2 mg Oral TID   hydrOXYzine   25 mg Oral QHS   lactose free nutrition  237 mL Oral BID BM   losartan   50 mg Oral Daily   pantoprazole   80 mg Oral Q1200   polyethylene glycol  17 g Oral BID   senna  2 tablet Oral QHS   sodium chloride  flush  3-10 mL Intravenous Q12H   venlafaxine   25 mg Oral BID WC   Continuous Infusions:    LOS: 5 days   Erle Odell Castor  [facility]  11/03/2023, 8:49 AM

## 2023-11-03 NOTE — Plan of Care (Signed)

## 2023-11-03 NOTE — TOC Progression Note (Addendum)
 Transition of Care [facility]) - Progression Note    Patient Details  Name: Kristen [name] MRN: [id number] Date of Birth: [birth year]  Transition of [facility]) CM/SW Contact  Bridget Cordella Simmonds, LCSW Phone Number: 11/03/2023, 11:26 AM  Clinical Narrative:   Bed offers provided to pt and daughter Devere on medicare choice document.  They are asking for responses from [facility], [facility], [facility].  CSW reached out to those facilities.  1100: Updated bed offers to Devere: [facility] does offer bed, [facility] full, [facility] does not offer.  Devere planning to stop by [facility] before deciding.    1300: [facility] now has bed, daughter informed, does want to accept this offer.  [facility] notified.  CSW discussed $46 per day extra charge with pt and daughter and they are in agreement with this.  1420: Auth request submitted in navi and approved: [id number], 5 days: 1/9-1/13.  Expected Discharge Plan: Skilled Nursing Facility Barriers to Discharge: Continued Medical Work up  Expected Discharge Plan and Services       Living arrangements for the past 2 months: Independent Living Facility                                       Social Determinants of Health (SDOH) Interventions SDOH Screenings   Food Insecurity: No Food Insecurity (10/30/2023)  Housing: Low Risk  (10/30/2023)  Transportation Needs: No Transportation Needs (10/30/2023)  Utilities: Not At Risk (10/30/2023)  Financial Resource Strain: Low Risk  (06/19/2019)   Received from [facility], [facility]  Social Connections: Socially Isolated (10/30/2023)  Stress: No Stress Concern Present (06/19/2019)   Received from [facility], [facility]  Tobacco Use: Low Risk  (10/30/2023)  Health Literacy: Medium Risk (02/01/2021)   Received from [facility], [facility]    Readmission Risk Interventions     No data to display

## 2023-11-03 NOTE — Progress Notes (Signed)
 Mobility Specialist: Progress Note   11/03/23 1123  Mobility  Activity Ambulated with assistance in hallway;Ambulated with assistance to bathroom  Level of Assistance Contact guard assist, steadying assist  Assistive Device Front wheel walker  Distance Ambulated (ft) 230 ft  RLE Weight Bearing Per Provider Order WBAT  Activity Response Tolerated well  Mobility Referral Yes  Mobility visit 1 Mobility  Mobility Specialist Start Time (ACUTE ONLY) 1055  Mobility Specialist Stop Time (ACUTE ONLY) 1110  Mobility Specialist Time Calculation (min) (ACUTE ONLY) 15 min    Pt was agreeable to mobility session - received in bed. Requested to use BR - completed void and peri care done ind. SV while standing in BR to pull pants up. Don/doff sneakers with little assistance from MS and daughter. CG throughout. Completed hallway ambulation as well with no complaints. Left in chair with all needs met, call bell in reach.   Ileana Lute Mobility Specialist Please contact via SecureChat or Rehab office at [phone]

## 2023-11-03 NOTE — Progress Notes (Addendum)
 Physical Therapy Treatment Patient Details Name: Kristen [name] MRN: [id number] DOB: [birth year] Today's Date: 11/03/2023   History of Present Illness Kristen [name] is a 89 y.o. female was admitted after a fall resulting in R hip fx, s/p anterior THA for surgical fixation, WBAT; noteworthy that pt had weakness for weeks leading up to this fall, and 10 pound weight loss. CT abdomen and pelvis was obtained which showed an enlarging right apical lung mass and right renal mass; with PMH significant for HTN, HLD, pulmonary hypertension, PE 2023 now on chronic Eliquis .    PT Comments  Pt received in chair, agreeable to therapy session and with good participation and tolerance for transfer, gait and curb step instruction with RW and rollator. Pt with decreased safety awareness during transfers and needing up to minA with mod safety cues for rollator brakes/curb step sequencing. Pt with decreased grip strength observed and remains a high fall risk due to decreased overall strength and instability due to R hip injury/pain and c/o fatigue. Pt had sat up in recliner ~30 mins prior to requesting to return to supine after gait training with PTA, disposition updated below per discussion with pt and supervising PT Ryan L as pt remains below functional baseline. Pt will continue to benefit from skilled rehab in a post acute setting < 3 hours/day to maximize functional gains before returning home.    If plan is discharge home, recommend the following: Assistance with cooking/housework;Help with stairs or ramp for entrance;A little help with walking and/or transfers;A little help with bathing/dressing/bathroom;Supervision due to cognitive status;Assist for transportation   Can travel by private vehicle     Yes  Equipment Recommendations  Rolling walker (2 wheels);BSC/3in1 (3in1 pending progress)    Recommendations for Other Services       Precautions / Restrictions Precautions Precautions: Fall Restrictions Weight  Bearing Restrictions Per Provider Order: Yes RLE Weight Bearing Per Provider Order: Weight bearing as tolerated     Mobility  Bed Mobility Overal bed mobility: Needs Assistance Bed Mobility: Sit to Supine       Sit to supine: Contact guard assist, Used rails   General bed mobility comments: CGA to safely raise her limbs over EOB, pt using BUE to assist with lifting her RLE    Transfers Overall transfer level: Needs assistance Equipment used: Rolling walker (2 wheels) Transfers: Sit to/from Stand Sit to Stand: Min assist, Contact guard assist           General transfer comment: cues for hand placement and rollator brakes. Pt tending to forgot to lock brakes prior to sitting or unlocks brakes prematurely (Pt unlocked brakes just prior to standing, instead of after standing upright)    Ambulation/Gait Ambulation/Gait assistance: Contact guard assist Gait Distance (Feet): 100 Feet (134ft, seated break, 60ft, seated break, 166ft) Assistive device: Rollator (4 wheels), Rolling walker (2 wheels) Gait Pattern/deviations: Step-to pattern, Decreased stride length, Decreased stance time - right, Trunk flexed       General Gait Details: No significant buckling with use of RW/rollator, pt primarily used rollator per her request and is more familiar with this device and reports pain is not increased with rollator. Pt does have some decreased safety with rollator during transfers/difficulty safely managing brakes and would probably be safer to use regular rolling walker with 2 front wheels at this time to reduce her fall risk. No dizziness or lightheadedness reported this date.   Stairs Stairs: Yes Stairs assistance: Min assist Stair Management: With walker, Forwards, Step to pattern  Number of Stairs: 1 General stair comments: single 4 curb step up/down with RW and PTA assist to manage device, cues for step sequencing with pt forgetting after verbal/visual demo and needing multimodal  cues to sequence properly for reduced pain.   Wheelchair Mobility     Tilt Bed    Modified Rankin (Stroke Patients Only)       Balance Overall balance assessment: Needs assistance Sitting-balance support: No upper extremity supported Sitting balance-Leahy Scale: Fair     Standing balance support: Bilateral upper extremity supported, Reliant on assistive device for balance Standing balance-Leahy Scale: Poor Standing balance comment: up to minA external assist needed when transferring between 2WW and 4WW standing upright.                            Cognition Arousal: Alert Behavior During Therapy: WFL for tasks assessed/performed Overall Cognitive Status: Within Functional Limits for tasks assessed                                 General Comments: Overall WFL, decreased safety awareness and pt forgetting to lock rollator brakes prior to sitting or unlocks brakes prior to standing instead of after standing up. Pt also reports that she pinched her finger in the rollator brake mechanism but denies injury other than discomfort, RN notified to follow up to make sure it is not still bothering her later.        Exercises General Exercises - Lower Extremity Ankle Circles/Pumps: AROM, Both, 10 reps, Supine Other Exercises Other Exercises: pt given Surgical Hip Exercises with supine and seated exercises listed, no standing exercises included as pt plans to DC to SNF for post-acute therapies.    General Comments General comments (skin integrity, edema, etc.): pt unable to remove gait belt buckle on her own due to decreased grip strength; reviewed use of ice and need to request pain meds from RN PRN, as they are not necessarily scheduled for her. Pt heels floated in supine.      Pertinent Vitals/Pain Pain Assessment Pain Assessment: 0-10 Pain Score: 8  Pain Location: R hip Pain Descriptors / Indicators: Dull, Grimacing Pain Intervention(s): Monitored  during session, Repositioned, Patient requesting pain meds-RN notified, Other (comment) (pt agreeable to ice, RN notified to bring ice pack with her pain meds)    Home Living                          Prior Function            PT Goals (current goals can now be found in the care plan section) Acute Rehab PT Goals Patient Stated Goal: Hopes to be able to get home PT Goal Formulation: With patient Time For Goal Achievement: 11/14/23 Progress towards PT goals: Progressing toward goals    Frequency    Min 1X/week      PT Plan      Co-evaluation              AM-PAC PT 6 Clicks Mobility   Outcome Measure  Help needed turning from your back to your side while in a flat bed without using bedrails?: A Little Help needed moving from lying on your back to sitting on the side of a flat bed without using bedrails?: A Lot (without rail) Help needed moving to and from a bed to a chair (  including a wheelchair)?: A Lot (mod safety cues) Help needed standing up from a chair using your arms (e.g., wheelchair or bedside chair)?: A Little Help needed to walk in hospital room?: A Little Help needed climbing 3-5 steps with a railing? : A Lot 6 Click Score: 15    End of Session Equipment Utilized During Treatment: Gait belt Activity Tolerance: Patient tolerated treatment well;Patient limited by pain Patient left: in bed;with call bell/phone within reach;with bed alarm set;Other (comment) (pt heels floated) Nurse Communication: Mobility status;Patient requests pain meds PT Visit Diagnosis: Unsteadiness on feet (R26.81);Other abnormalities of gait and mobility (R26.89);History of falling (Z91.81);Muscle weakness (generalized) (M62.81)     Time: 8454-8395 PT Time Calculation (min) (ACUTE ONLY): 19 min  Charges:    $Gait Training: 8-22 mins PT General Charges $$ ACUTE PT VISIT: 1 Visit                     Larinda Herter P., PTA Acute Rehabilitation Services Secure Chat  Preferred 9a-5:30pm Office: [phone]    Connell HERO [facility] 11/03/2023, 4:38 PM

## 2023-11-04 ENCOUNTER — Other Ambulatory Visit: Payer: Self-pay | Admitting: Pharmacist

## 2023-11-04 DIAGNOSIS — S72001A Fracture of unspecified part of neck of right femur, initial encounter for closed fracture: Secondary | ICD-10-CM | POA: Diagnosis not present

## 2023-11-04 MED ORDER — OXYCODONE HCL 5 MG PO TABS: 2.5000 mg | ORAL_TABLET | ORAL | 0 refills | Status: DC | PRN

## 2023-11-04 MED ORDER — AMLODIPINE BESYLATE 5 MG PO TABS: 5.0000 mg | ORAL_TABLET | Freq: Every day | ORAL | 1 refills | Status: DC

## 2023-11-04 MED ORDER — HYDROMORPHONE HCL 2 MG PO TABS: 2.0000 mg | ORAL_TABLET | Freq: Two times a day (BID) | ORAL | 0 refills | Status: DC

## 2023-11-04 MED ORDER — LOSARTAN POTASSIUM 100 MG PO TABS: 100.0000 mg | ORAL_TABLET | Freq: Every day | ORAL | 1 refills | Status: DC

## 2023-11-04 MED ORDER — VENLAFAXINE HCL 25 MG PO TABS: 25.0000 mg | ORAL_TABLET | Freq: Two times a day (BID) | ORAL | 0 refills | Status: DC

## 2023-11-04 MED ORDER — METHOCARBAMOL 500 MG PO TABS: 500.0000 mg | ORAL_TABLET | Freq: Four times a day (QID) | ORAL | 0 refills | Status: AC | PRN

## 2023-11-04 NOTE — Progress Notes (Signed)
 Physical Therapy Treatment Patient Details Name: Kristen [name] MRN: [id number] DOB: [birth year] Today's Date: 11/04/2023   History of Present Illness Kristen [name] is a 89 y.o. female was admitted after a fall resulting in R hip fx, s/p anterior THA for surgical fixation, WBAT; noteworthy that pt had weakness for weeks leading up to this fall, and 10 pound weight loss. CT abdomen and pelvis was obtained which showed an enlarging right apical lung mass and right renal mass; with PMH significant for HTN, HLD, pulmonary hypertension, PE 2023 now on chronic Eliquis .    PT Comments  Pt received in supine, agreeable to therapy session prior to DC, pt daughter present for instruction on use of gait belt, supine/seated hip exercises handout, safety with transfers and gait using RW and activity pacing. Pt with c/o dizziness and needs up to minA for transfer safety and unsupported standing, but reports she has not been drinking much water. Pt reports symptom of dizziness improves with seated breaks. Limited distance during gait trial due to pt dizziness while standing and imminent discharge, pt up in transport chair in her room with daughter and RN present and pt drinking water at end of session. R hip incision c/d/I (RN placed new one during session as old one was peeling up). Pt will continue to benefit from skilled rehab in a post acute setting to maximize functional gains before returning home.     If plan is discharge home, recommend the following: Assistance with cooking/housework;Help with stairs or ramp for entrance;A little help with walking and/or transfers;A little help with bathing/dressing/bathroom;Supervision due to cognitive status;Assist for transportation   Can travel by private vehicle     Yes  Equipment Recommendations  Rolling walker (2 wheels);BSC/3in1 (3in1 pending progress)    Recommendations for Other Services       Precautions / Restrictions Precautions Precautions:  Fall Restrictions Weight Bearing Restrictions Per Provider Order: Yes RLE Weight Bearing Per Provider Order: Weight bearing as tolerated     Mobility  Bed Mobility Overal bed mobility: Needs Assistance Bed Mobility: Supine to Sit     Supine to sit: Contact guard     General bed mobility comments: CGA to safely raise her limbs over EOB, pt using BUE to assist with lifting her RLE, increased time/effort due to pain; mild dizziness sitting up    Transfers Overall transfer level: Needs assistance Equipment used: Rolling walker (2 wheels) Transfers: Sit to/from Stand Sit to Stand: Contact guard assist, Min assist           General transfer comment: cues for hand placement, pt dizzy upon standing initially and needs minA for stand>sit safety, after seated break and pt drinking water, pt able to stand again to walk to transport chair for DC and CGA for stand>sit to transport WC    Ambulation/Gait Ambulation/Gait assistance: Contact guard assist Gait Distance (Feet): 10 Feet Assistive device: Rolling walker (2 wheels) Gait Pattern/deviations: Step-to pattern, Decreased stride length, Decreased stance time - right, Trunk flexed, Antalgic Gait velocity: decreased     General Gait Details: Mild c/o dizziness and pt preparing for DC so transport chair moved closer and CGA for safety via gait belt, pt daughter present and plans to transport her after RN reviews her DC paperwork   Stairs         General stair comments: defer, pt leaving   Wheelchair Mobility     Tilt Bed    Modified Rankin (Stroke Patients Only)       Balance  Overall balance assessment: Needs assistance Sitting-balance support: No upper extremity supported Sitting balance-Leahy Scale: Fair Sitting balance - Comments: pt prefers BUE support due to c/o dizziness sitting up   Standing balance support: Bilateral upper extremity supported, Reliant on assistive device for balance Standing balance-Leahy  Scale: Poor Standing balance comment: up to minA external assist needed when unsupported, CGA with RW                            Cognition Arousal: Alert Behavior During Therapy: WFL for tasks assessed/performed Overall Cognitive Status: Within Functional Limits for tasks assessed                                 General Comments: Overall WFL, some decreased safety awareness but following cues when given.        Exercises Other Exercises Other Exercises: PTA reviews Surgical Hip Exercises handout with pt and daughter via visual/verbal demo and pt daughter expressed understanding    General Comments General comments (skin integrity, edema, etc.): RN changed dressing over her R hip due to Aquacel not staying stuck on distal portion, no observed drainage or edema. Did not assess BP as pt preparing to leave, but PTA did obtain water for her to drink during session due to pt c/o dizziness and pt states she has not been drinking much water. Of note, pt has not had pain meds in past 6 hours so some symptoms could be related to high pain score. Pt defers ice pack for pain.      Pertinent Vitals/Pain Pain Assessment Pain Assessment: Faces Faces Pain Scale: Hurts even more Pain Location: R hip Pain Descriptors / Indicators: Dull, Grimacing, Operative site guarding, Moaning Pain Intervention(s): Limited activity within patient's tolerance, Monitored during session, Repositioned, Other (comment) (offered ice but pt defers; pain meds ~6 hours prior)    Home Living                          Prior Function            PT Goals (current goals can now be found in the care plan section) Acute Rehab PT Goals Patient Stated Goal: Hopes to be able to get home PT Goal Formulation: With patient Time For Goal Achievement: 11/14/23 Progress towards PT goals: Progressing toward goals    Frequency    Min 1X/week      PT Plan      Co-evaluation               AM-PAC PT 6 Clicks Mobility   Outcome Measure  Help needed turning from your back to your side while in a flat bed without using bedrails?: A Little Help needed moving from lying on your back to sitting on the side of a flat bed without using bedrails?: A Little Help needed moving to and from a bed to a chair (including a wheelchair)?: A Lot (mod safety cues) Help needed standing up from a chair using your arms (e.g., wheelchair or bedside chair)?: A Little Help needed to walk in hospital room?: A Little Help needed climbing 3-5 steps with a railing? : A Lot 6 Click Score: 16    End of Session Equipment Utilized During Treatment: Gait belt Activity Tolerance: Patient limited by pain;Treatment limited secondary to medical complications (Comment);Other (comment) (dizziness, improves with seated rest breaks) Patient  left: with call bell/phone within reach;Other (comment);in chair;with family/visitor present;with nursing/sitter in room (in care of daughter and RN) Nurse Communication: Mobility status;Patient requests pain meds PT Visit Diagnosis: Unsteadiness on feet (R26.81);Other abnormalities of gait and mobility (R26.89);History of falling (Z91.81);Muscle weakness (generalized) (M62.81)     Time: 1341-1400 PT Time Calculation (min) (ACUTE ONLY): 19 min  Charges:    $Therapeutic Activity: 8-22 mins PT General Charges $$ ACUTE PT VISIT: 1 Visit                     Kristen [name] P., PTA Acute Rehabilitation Services Secure Chat Preferred 9a-5:30pm Office: [phone]    Connell HERO [facility] 11/04/2023, 2:11 PM

## 2023-11-04 NOTE — Plan of Care (Signed)
  Problem: Clinical Measurements: Goal: Ability to maintain clinical measurements within normal limits will improve Outcome: Progressing Goal: Diagnostic test results will improve Outcome: Completed/Met Goal: Respiratory complications will improve Outcome: Completed/Met Goal: Cardiovascular complication will be avoided Outcome: Completed/Met   Problem: Activity: Goal: Risk for activity intolerance will decrease Outcome: Progressing   Problem: Nutrition: Goal: Adequate nutrition will be maintained Outcome: Completed/Met   Problem: Coping: Goal: Level of anxiety will decrease Outcome: Progressing   Problem: Elimination: Goal: Will not experience complications related to bowel motility Outcome: Progressing Goal: Will not experience complications related to urinary retention Outcome: Progressing

## 2023-11-04 NOTE — TOC Transition Note (Signed)
 Transition of Care [facility]) - Discharge Note   Patient Details  Name: Kristen [name] MRN: [id number] Date of Birth: August 30, 1933  Transition [facility]) CM/SW Contact:  Bridget Cordella Simmonds, LCSW Phone Number: 11/04/2023, 12:55 PM   Clinical Narrative:   Pt discharging to [facility], room 104.  RN call report to [phone].    Daughter Devere will transport and will need pt brought down to main north tower entrance with assistance getting into the vehicle.      Final next level of care: Skilled Nursing Facility Barriers to Discharge: Barriers Resolved   Patient Goals and CMS Choice   CMS Medicare.gov Compare Post Acute Care list provided to:: Patient        Discharge Placement              Patient chooses bed at:  ([facility]) Patient to be transferred to facility by: daughter Devere Name of family member notified: daughter Devere Patient and family notified of of transfer: 11/04/23  Discharge Plan and Services Additional resources added to the After Visit Summary for                                       Social Drivers of Health (SDOH) Interventions SDOH Screenings   Food Insecurity: No Food Insecurity (10/30/2023)  Housing: Low Risk  (10/30/2023)  Transportation Needs: No Transportation Needs (10/30/2023)  Utilities: Not At Risk (10/30/2023)  Financial Resource Strain: Low Risk  (06/19/2019)   Received from [facility], [facility]  Social Connections: Socially Isolated (10/30/2023)  Stress: No Stress Concern Present (06/19/2019)   Received from [facility], [facility]  Tobacco Use: Low Risk  (10/30/2023)  Health Literacy: Medium Risk (02/01/2021)   Received from [facility], [facility]     Readmission Risk Interventions     No data to display

## 2023-11-04 NOTE — TOC Progression Note (Signed)
 Transition of Care [facility]) - Progression Note    Patient Details  Name: Kristen [name] MRN: [id number] Date of Birth: Feb 09, 1933  Transition of Care [facility]) CM/SW Contact  Bridget Cordella Simmonds, LCSW Phone Number: 11/04/2023, 12:51 PM  Clinical Narrative:    CSW confirmed with [facility] that she can receive pt today.  1245: TC daughter Devere.  Discussed transportation.  She is able to transport pt with assistance getting into and out of the car.     Expected Discharge Plan: Skilled Nursing Facility Barriers to Discharge: Continued Medical Work up  Expected Discharge Plan and Services       Living arrangements for the past 2 months: Independent Living Facility Expected Discharge Date: 11/04/23                                     Social Determinants of Health (SDOH) Interventions SDOH Screenings   Food Insecurity: No Food Insecurity (10/30/2023)  Housing: Low Risk  (10/30/2023)  Transportation Needs: No Transportation Needs (10/30/2023)  Utilities: Not At Risk (10/30/2023)  Financial Resource Strain: Low Risk  (06/19/2019)   Received from [facility], [facility]  Social Connections: Socially Isolated (10/30/2023)  Stress: No Stress Concern Present (06/19/2019)   Received from [facility], [facility]  Tobacco Use: Low Risk  (10/30/2023)  Health Literacy: Medium Risk (02/01/2021)   Received from [facility], [facility]    Readmission Risk Interventions     No data to display

## 2023-11-04 NOTE — Discharge Summary (Addendum)
 Physician Discharge Summary  Patient ID: Kristen [name] MRN: [id number] DOB/AGE: Aug 10, 1933 89 y.o.  Admit date: 10/29/2023 Discharge date: 11/04/2023  Admission Diagnoses:  Discharge Diagnoses:  Principal Problem:   Closed right hip fracture, initial encounter [facility]) Active Problems:   Essential hypertension   Pulmonary mass   History of pulmonary embolism   Renal mass   Leukocytosis   S/P total right hip arthroplasty   Discharged Condition: stable  Hospital Course: Patient is an 89 year old female past medical history significant for hypertension, hyperlipidemia, pulmonary hypertension, and pulmonary embolism in 2023 on Eliquis .  Patient was admitted following a mechanical fall, with resultant acute mildly displaced subcapital right femoral neck fracture.  Patient was admitted for further assessment and management.  Orthopedic team directed patient's management.  Patient eventually underwent total right hip arthroplasty.  Patient has been cleared for discharge by the orthopedic team.  Patient will be discharged to subacute rehab facility (skilled nursing facility).  Closed right hip fracture, initial encounter [facility]) Orthopedic surgery was consulted and she is status post total hip arthroplasty on 10/29/2022. Continue analgesics as needed. Patient has restarted on Eliquis . -PT evaluated the patient, follow-up with therapy discharged to skilled nursing facility.     Leukocytosis: Likely reactive.   History of PE: Continue Eliquis .   Essential hypertension: Blood pressure continues to be elevated continue Cozaar  at low-dose Norvasc .   Right apical pulmonary mass CT of the chest showed a 3.6 cm lung mass. Was previously seen by Dr. Stacy as an outpatient recommended follow-up as an outpatient.   Right renal mass: CT of the abdomen pelvis on 10/28/2023 showed 2 cm renal mass. Will need an MRI as an outpatient.   GERD: Continue PPI.   Anxiety and depression: Is being weaned off  Effexor  and continue on hydroxyzine  at night    Consults: orthopedic surgery  Significant Diagnostic Studies:   Treatments:  TOTAL HIP ARTHROPLASTY ANTERIOR APPROACH (Right: Hip)   Discharge Exam: Blood pressure (!) 175/71, pulse 79, temperature 97.9 F (36.6 C), temperature source Oral, resp. rate 17, SpO2 99%.   Disposition: Discharge disposition: 03-Skilled Nursing Facility       Discharge Instructions     Diet - low sodium heart healthy   Complete by: As directed    Increase activity slowly   Complete by: As directed       Allergies as of 11/04/2023       Reactions   Penicillins Hives, Rash   Other reaction(s): Other unknown   Tetracyclines & Related Anaphylaxis   Other reaction(s): Other unknown   Sulfa Antibiotics Hives, Other (See Comments)   unknown        Medication List     STOP taking these medications    diclofenac  Sodium 1 % Gel Commonly known as: VOLTAREN    FeroSul 325 (65 Fe) MG tablet Generic drug: ferrous sulfate    ondansetron  4 MG disintegrating tablet Commonly known as: ZOFRAN -ODT   traZODone  100 MG tablet Commonly known as: DESYREL        TAKE these medications    amLODipine  5 MG tablet Commonly known as: NORVASC  Take 1 tablet (5 mg total) by mouth daily. Start taking on: November 05, 2023   atorvastatin  20 MG tablet Commonly known as: LIPITOR Take 20 mg by mouth daily.   cyanocobalamin  1000 MCG/ML injection Commonly known as: VITAMIN B12 Inject 100 mcg into the muscle every 30 (thirty) days.   Eliquis  5 MG Tabs tablet Generic drug: apixaban  Take 5 mg by mouth 2 (two)  times daily.   esomeprazole 40 MG capsule Commonly known as: NEXIUM Take 40 mg by mouth in the morning and at bedtime. Pt taking twice a day for 30 days, then going back to one tablet once a day. Pt started taking one tab bid on 10/22/23   gabapentin  300 MG capsule Commonly known as: NEURONTIN  Take 300 mg by mouth 2 (two) times daily.    HYDROmorphone  2 MG tablet Commonly known as: DILAUDID  Take 1 tablet (2 mg total) by mouth in the morning and at bedtime.   losartan  100 MG tablet Commonly known as: COZAAR  Take 1 tablet (100 mg total) by mouth daily. Start taking on: November 05, 2023 What changed:  medication strength how much to take   methocarbamol  500 MG tablet Commonly known as: ROBAXIN  Take 1 tablet (500 mg total) by mouth every 6 (six) hours as needed for up to 7 days for muscle spasms.   oxyCODONE  5 MG immediate release tablet Commonly known as: Oxy IR/ROXICODONE  Take 0.5-1 tablets (2.5-5 mg total) by mouth every 4 (four) hours as needed for moderate pain (pain score 4-6).   venlafaxine  25 MG tablet Commonly known as: EFFEXOR  Take 1 tablet (25 mg total) by mouth 2 (two) times daily with a meal.        Contact information for follow-up providers     Ernie Cough, MD. Schedule an appointment as soon as possible for a visit in 2 week(s).   Specialty: Orthopedic Surgery Contact information: [address] [place] [address] [phone]              Contact information for after-discharge care     Destination     [facility] .   Service: Skilled Nursing Contact information: [address] [place]  [address] [phone]                     Time spent: 35 minutes.  SignedBETHA Leatrice LILLETTE Rosario 11/04/2023, 12:55 PM

## 2023-11-08 ENCOUNTER — Ambulatory Visit: Payer: Self-pay | Admitting: Licensed Clinical Social Worker

## 2023-11-11 NOTE — Patient Outreach (Signed)
  Care Coordination   Follow Up Visit Note   11/08/2023 Name: Kristen [name] MRN: [id number] DOB: April 17, 1933  Kristen [name] is a 89 y.o. year old female who sees Emilio Aspen, MD for primary care. I spoke with  Lisabeth Bibb daughter, by phone today.  What matters to the patients health and wellness today?  Symptom Management, Med Management, Level of Care    Goals Addressed             This Visit's Progress    Obtain Supportive Resources-Caregiver Strain   On track    Activities and task to complete in order to accomplish goals.   Keep all upcoming appointments discussed today Continue with compliance of taking medication prescribed by Doctor Implement healthy coping skills discussed to assist with management of symptoms. F/up with psychiatrist regarding ongoing depression/anxiety symptoms and medication adjustments Complete new pt ppwk for oral surgeon to go on wait list. Initial appt 12/23/23         SDOH assessments and interventions completed:  No     Care Coordination Interventions:  Yes, provided  Interventions Today    Flowsheet Row Most Recent Value  Chronic Disease   Chronic disease during today's visit Chronic Kidney Disease/End Stage Renal Disease (ESRD), Hypertension (HTN), Other  [Chronic Pain Disorder]  General Interventions   General Interventions Discussed/Reviewed General Interventions Reviewed, Doctor Visits, Level of Care  Doctor Visits Discussed/Reviewed Doctor Visits Reviewed  Level of Care Skilled Nursing Facility  [Pt has been discharged to [facility]. Will f/up with family prior to d/c to promote safety in the home]  Mental Health Interventions   Mental Health Discussed/Reviewed Mental Health Reviewed, Coping Strategies  Pharmacy Interventions   Pharmacy Dicussed/Reviewed Pharmacy Topics Discussed, Medication Adherence  [Pt had adverse side effects, including, nausea, loss of weigh, and weakening while taking Effexor. Has titrated dose and  family will f/up with Psychiatrist about discontinuing medicine]  Safety Interventions   Safety Discussed/Reviewed Safety Reviewed, Fall Risk       Follow up plan: Follow up call scheduled for 2-4 weeks    Encounter Outcome:  Patient Visit Completed   Jenel Lucks, LCSW [place]  [facility], [facility] Clinical Social Worker Direct Dial: [phone]  Fax: [phone] Website: [email or web address] 5:53 PM

## 2023-11-11 NOTE — Patient Instructions (Signed)
Visit Information  Thank you for taking time to visit with me today. Please don't hesitate to contact me if I can be of assistance to you before our next scheduled telephone appointment.  Following are the goals we discussed today:  (Copy and paste patient goals from clinical care plan here)  Our next appointment is by telephone on 2/3 at 11 AM  Please call the care guide team at [phone] if you need to cancel or reschedule your appointment.   If you are experiencing a Mental Health or Behavioral Health Crisis or need someone to talk to, please call the Suicide and Crisis Lifeline: 988 call 911   Patient verbalizes understanding of instructions and care plan provided today and agrees to view in MyChart. Active MyChart status and patient understanding of how to access instructions and care plan via MyChart confirmed with patient.     Windy Fast [facility]  [facility], [facility] Clinical Social Worker Direct Dial: [phone]  Fax: [phone] Website: [email or web address] 5:54 PM

## 2023-11-16 ENCOUNTER — Telehealth: Payer: Self-pay | Admitting: Pharmacist

## 2023-11-16 NOTE — Progress Notes (Signed)
   11/16/2023  Patient ID: Kristen [name], female   DOB: [birth year], 89 y.o.   MRN: [id number]  Called and spoke with the daughter on the phone today. Patient is currently at [facility] but was told that she will be getting discharged on Friday or Saturday.   Due to the medication changes that occurred at the facility, they are requesting a home visit for a medication review as soon as possible. Advised earliest we could do is Monday at 8AM.  Appointment scheduled and daughter has phone number to call if something occurs prior to then.   Marlowe Aschoff, PharmD [facility] Phone Number: [phone]

## 2023-11-17 ENCOUNTER — Telehealth: Payer: Self-pay | Admitting: Pharmacist

## 2023-11-17 NOTE — Progress Notes (Signed)
   11/17/2023  Patient ID: Kristen [name], female   DOB: [birth year], 89 y.o.   MRN: [id number]  Received a call from daughter today. Reports Kristen [name] will be discharged from the facility this Saturday.  Advised we will review medication discrepancies on Monday at 8AM and correct any issues with Dr. Orson Aloe on Monday as well. Requested we change the location to her house in [facility] until the patient's medication changes are squared away. Home visit location has been updated. Advised to reach out if she changes her mind prior to Monday morning.    Marlowe Aschoff, PharmD [facility] Phone Number: [phone]

## 2023-11-18 ENCOUNTER — Ambulatory Visit (INDEPENDENT_AMBULATORY_CARE_PROVIDER_SITE_OTHER): Payer: Medicare PPO | Admitting: Emergency Medicine

## 2023-11-18 ENCOUNTER — Encounter: Payer: Self-pay | Admitting: Emergency Medicine

## 2023-11-18 VITALS — BP 142/64 | HR 63 | Ht 62.0 in | Wt 123.8 lb

## 2023-11-18 DIAGNOSIS — C3490 Malignant neoplasm of unspecified part of unspecified bronchus or lung: Secondary | ICD-10-CM

## 2023-11-18 DIAGNOSIS — N2889 Other specified disorders of kidney and ureter: Secondary | ICD-10-CM

## 2023-11-18 DIAGNOSIS — R918 Other nonspecific abnormal finding of lung field: Secondary | ICD-10-CM | POA: Diagnosis not present

## 2023-11-18 NOTE — Assessment & Plan Note (Signed)
Right upper lobe mixed density pulmonary opacity now 3.6 x 2.9 cm.  Has been slowly growing and becoming more solid consistent with an adenocarcinoma.  At our last visit we decided to be conservative and follow this.  She is at least contemplating possible treatment if it is minimally invasive such as SBRT.  We will perform a PET scan to better assess, determine whether there is any evidence of associated disease, lymphadenopathy.

## 2023-11-18 NOTE — Progress Notes (Signed)
Subjective:    Patient ID: Kristen [name], female    DOB: [birth year], 89 y.o.   MRN: [id number]  HPI  ROV 12/23/22 --follow-up visit for 89 year old woman, never smoker, with history of hypertension, seizures, B12 deficiency, trigeminal neuralgia, pulmonary embolism.  I saw her following her pulmonary embolism to evaluate this as well as a semisolid right upper lobe density suspicious for possible adenocarcinoma.  In retrospect she probably had a right upper lobe opacity noted on prior CT scans from [facility].  Most recent CT was done 11/30/2022 as below She has nasal congestion, some cough w eating. Has some near aspiration. She has been on flonase and atrovent  CT scan of the chest 11/30/2022 reviewed by me, shows a 2.9 x 1.8 mixed density peripheral right upper lobe pulmonary nodule.  This is predominantly groundglass with a 10 mm lateral solid component and some associated pleural retraction.  The solid component has enlarged slightly compared with 1 year ago 10/2021, suggestive of adenocarcinoma.  ROV 11/18/2023 --89 year old woman, never smoker with hypertension, seizures, trigeminal neuralgia and history of PE.  I have seen her for the PE and also a semisolid right upper lobe density suspicious for an adenocarcinoma.  There has been very slow interval increase in density on serial imaging. She was in the ED 10/28/2023 and underwent a CT scan of the chest abdomen and pelvis.  I have reviewed and there was some interval increase in size of her subsolid right apical lung mass now 3.6 x 2.9 cm (from 3.1 x 2.4 cm).  There is also a new 5 mm right lower lobe basilar pulmonary nodule and some subpleural lymph nodes.   Review of Systems As per HPI  Past Medical History:  Diagnosis Date   Anemia    Arthritis    B12 deficiency    Chronic kidney disease    Chronic pain disorder    trigeminal neuralgia   Depression    Dyslipidemia    Hypertension    PONV (postoperative nausea and vomiting)     Seizure disorder (HCC)    decades since last seizure   Tremor    Trigeminal nerve disorder      No family history on file.  No family hx lung CA   Social History   Socioeconomic History   Marital status: Widowed    Spouse name: Not on file   Number of children: 3   Years of education: Not on file   Highest education level: Not on file  Occupational History   Occupation: retired  Tobacco Use   Smoking status: Never    Passive exposure: Never   Smokeless tobacco: Never  Substance and Sexual Activity   Alcohol use: Not Currently   Drug use: Never   Sexual activity: Not on file  Other Topics Concern   Not on file  Social History Narrative   12/15/21 lives at [place]   Social Drivers of Health   Financial Resource Strain: Low Risk  (06/19/2019)   Received from [facility], [facility]   Overall Financial Resource Strain (CARDIA)    Difficulty of Paying Living Expenses: Not hard at all  Food Insecurity: No Food Insecurity (10/30/2023)   Hunger Vital Sign    Worried About Running Out of Food in the Last Year: Never true    Ran Out of Food in the Last Year: Never true  Transportation Needs: No Transportation Needs (10/30/2023)   PRAPARE - Transportation    Lack of  Transportation (Medical): No    Lack of Transportation (Non-Medical): No  Physical Activity: Not on file  Stress: No Stress Concern Present (06/19/2019)   Received from [facility], [facility]   [facility] - Occupational Stress Questionnaire    Feeling of Stress : Only a little  Social Connections: Socially Isolated (10/30/2023)   Social Connection and Isolation Panel [NHANES]    Frequency of Communication with Friends and Family: More than three times a week    Frequency of Social Gatherings with Friends and Family: More than three times a week    Attends Religious Services: Never    Database administrator or Organizations: No    Attends Banker  Meetings: Not on file    Marital Status: Widowed  Intimate Partner Violence: Not At Risk (10/30/2023)   Humiliation, Afraid, Rape, and Kick questionnaire    Fear of Current or Ex-Partner: No    Emotionally Abused: No    Physically Abused: No    Sexually Abused: No    [place], [place], [place], [place] Was a Runner, 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   Allergies  Allergen Reactions   Penicillins Hives and Rash    Other reaction(s): Other unknown    Tetracyclines & Related Anaphylaxis    Other reaction(s): Other unknown    Sulfa Antibiotics Hives and Other (See Comments)    unknown     Outpatient Medications Prior to Visit  Medication Sig Dispense Refill   amLODipine (NORVASC) 5 MG tablet Take 1 tablet (5 mg total) by mouth daily. 30 tablet 1   atorvastatin (LIPITOR) 20 MG tablet Take 20 mg by mouth daily.     cyanocobalamin (,VITAMIN B-12,) 1000 MCG/ML injection Inject 100 mcg into the muscle every 30 (thirty) days.     ELIQUIS 5 MG TABS tablet Take 5 mg by mouth 2 (two) times daily.     esomeprazole (NEXIUM) 40 MG capsule Take 40 mg by mouth in the morning and at bedtime. Pt taking twice a day for 30 days, then going back to one tablet once a day. Pt started taking one tab bid on 10/22/23     gabapentin (NEURONTIN) 300 MG capsule Take 300 mg by mouth 2 (two) times daily.     HYDROmorphone (DILAUDID) 2 MG tablet Take 1 tablet (2 mg total) by mouth in the morning and at bedtime. 30 tablet 0   losartan (COZAAR) 100 MG tablet Take 1 tablet (100 mg total) by mouth daily. 30 tablet 1   oxyCODONE (OXY IR/ROXICODONE) 5 MG immediate release tablet Take 0.5-1 tablets (2.5-5 mg total) by mouth every 4 (four) hours as needed for moderate pain (pain score 4-6). 30 tablet 0   venlafaxine (EFFEXOR) 25 MG tablet Take 1 tablet (25 mg total) by mouth 2 (two) times daily with a meal. 60 tablet 0   No facility-administered medications prior to visit.        Objective:   Physical Exam  Vitals:   11/18/23 1516   BP: (!) 142/64  Pulse: 63  SpO2: 97%  Weight: 123 lb 12.8 oz (56.2 kg)  Height: 5\' 2"  (1.575 m)   Gen: Pleasant, elderly, well-nourished, in no distress,  normal affect  ENT: No lesions,  mouth clear,  oropharynx clear, no postnasal drip  Neck: No JVD, no stridor  Lungs: No use of accessory muscles, no crackles or wheezing on normal respiration, no wheeze on forced expiration  Cardiovascular: RRR, heart sounds normal,  no murmur or gallops, no peripheral edema  Musculoskeletal: No deformities, no cyanosis or clubbing  Neuro: alert, awake, non focal  Skin: Warm, no lesions or rash    Assessment & Plan:   Pulmonary mass Right upper lobe mixed density pulmonary opacity now 3.6 x 2.9 cm.  Has been slowly growing and becoming more solid consistent with an adenocarcinoma.  At our last visit we decided to be conservative and follow this.  She is at least contemplating possible treatment if it is minimally invasive such as SBRT.  We will perform a PET scan to better assess, determine whether there is any evidence of associated disease, lymphadenopathy.  Renal mass Noted on most recent CT chest abdomen and pelvis.  She does have a history of renal cysts.  Question whether this is the same process versus new lesion, renal mass.  Given her age and overall functional capacity unclear whether any intervention would be able to be made but she does want to have insight into whether this could be malignancy.  The PET scan will hopefully help with this.  I will also refer her to urology to review the films and discuss.  Time spent 40 minutes  Levy Pupa, MD, PhD 11/18/2023, 8:00 PM [facility] [phone] or if no answer before 7:00PM call [phone] For any issues after 7:00PM please call eLink [phone]

## 2023-11-18 NOTE — Patient Instructions (Signed)
We will perform a PET scan to better characterize a right upper lobe lung lesion We will refer you to see urology to review your CT scans of the abdomen and assess renal cysts versus renal mass Follow with Dr. Delton Coombes next available after your PET scan so we can review that study and decide how to proceed.

## 2023-11-18 NOTE — Assessment & Plan Note (Signed)
Noted on most recent CT chest abdomen and pelvis.  She does have a history of renal cysts.  Question whether this is the same process versus new lesion, renal mass.  Given her age and overall functional capacity unclear whether any intervention would be able to be made but she does want to have insight into whether this could be malignancy.  The PET scan will hopefully help with this.  I will also refer her to urology to review the films and discuss.

## 2023-11-22 ENCOUNTER — Other Ambulatory Visit: Payer: Self-pay | Admitting: Pharmacist

## 2023-11-22 ENCOUNTER — Telehealth: Payer: Self-pay | Admitting: Emergency Medicine

## 2023-11-22 NOTE — Progress Notes (Unsigned)
11/22/2023 Name: Kristen [name] MRN: [id number] DOB: 12/22/1932  Chief Complaint  Patient presents with   Medication Management   Transitions Of Care    Kristen [name] is a 89 y.o. year old female who presented for a home visit.    They were referred to the pharmacist by their PCP for assistance in managing complex medication management.   Patient was originally scheduled to be seen on 11/04/23 after speaking in December, but went to ED on 10/28/23 and 10/29/23  ED 10/28/23: Abdominal + chest pain; weight loss over the past few months; was told she had a lung nodule and gastritis prior, but found second nodule and increased size in lung; also a new renal mass  ED 10/29/23: Mechanical fall causing right femoral neck fracture leading to hip surgery on 10/30/23  Discharged on 11/04/23 to [place]; living at [facility]  Discharged from [facility] on Saturday  Pulmonology visit on 11/18/23: Reviewed images, but not much else can be done; however GI pain may be from renal mass   Subjective:  Care Team: Primary Care Provider: Emilio Aspen, MD ; Next Scheduled Visit: 12/01/23 Clinical Pharmacist: Marlowe Aschoff, PharmD  Medication Access/Adherence  Current Pharmacy:  [facility] [place], [place] - [address] [place] [address] Phone: [phone] Fax: [phone]  [facility] [address] [place] [address] Phone: [phone] Fax: [phone]   Patient reports affordability concerns with their medications: No  Patient reports access/transportation concerns to their pharmacy: No  Patient reports adherence concerns with their medications:  No     Medication Management:  Current adherence strategy: Pill packs for twice a day  Patient reports Good adherence to medications  Patient has been staying with daughter Kristen [name] until her medications can be adjusted  appropriately  Recommended reconciliation list for Dr. Orson Aloe:  Morning: Eliquis 5mg   Esomeprazole 40mg  Hydromorphone 2mg  every 6 hours as needed- takes 2 tablets in the AM per daughter   Bedtime: Eliquis 5mg  Esomeprazole 40mg  Hydroxyzine 25mg  daily  B-12 injection monthly  PRN meds she has: Methocarbamol 500mg  every 6 hours as needed for muscle spasms Zofran ODT 4mg  up to 3 times a day as needed Tylenol 650mg  as needed Hydromorphone 1mg  for breakthrough pain emergencies Colace or Miralax for constipation with narcotic   Ask about: BP meds- was taking Amlodipine 5mg  + Losartan 50mg  upon facility discharge; however BP was low today and was only taking Losartan 25mg  upon hospitalization 2. Gabapentin- was taking 300mg  twice a day 3. Should she continue the Hydromorphone up to 4x per day with the Gabapentin? 4. Can she stop the Atorvastatin at this point? 5. Antidepressant recommendation? Nausea with Venlafaxine still- maybe go back to Duloxetine 30mg  daily or just do hydroxyzine alone for now?   BP readings from visit at 8AM with no BP meds since yesterday morning: Standing: 107/64 HR 93 Sitting: 131/69 HR 75   Objective:  No results found for: "HGBA1C"  Lab Results  Component Value Date   CREATININE 1.07 (H) 11/01/2023   BUN 22 11/01/2023   NA 130 (L) 11/01/2023   K 4.8 11/01/2023   CL 95 (L) 11/01/2023   CO2 22 11/01/2023    No results found for: "CHOL", "HDL", "LDLCALC", "LDLDIRECT", "TRIG", "CHOLHDL"  Medications Reviewed Today   Medications were not reviewed in this encounter       Assessment/Plan:   Medication Management: - Currently strategy sufficient  to maintain appropriate adherence to prescribed medication regimen - Patient is well organized and can taken them on her own, but needs them prepackaged with [facility] for her scheduled medications    Follow Up Plan:  - Will decide on a follow-up call once we have more information  back from Dr. Orson Aloe - Reviewed medications with the daughter at her home today - Message sent to Dr. Orson Aloe regarding the questions leftover - Will contact the pharmacy on how to fill next month's medications later today  *Of note regarding BP concerns, patient would not be able to maneuver an arm BP cuff on her own to check at the Independent Living Facility  *Also good to note that it is difficult to determine if pain is from stomach, hip, or headache as patient can give vague answers when asked about cause of pain   Marlowe Aschoff, PharmD [facility] Phone Number: [phone]

## 2023-11-22 NOTE — Telephone Encounter (Signed)
[name] has questions about the Pet scan and if it's fine to do pet scan when pt had a hip replacement that is mostly healed.

## 2023-11-23 NOTE — Telephone Encounter (Signed)
I called and spoke with pt's daughter and notified of response per Dr Delton Coombes  She verbalized understanding  Nothing further needed

## 2023-11-23 NOTE — Telephone Encounter (Signed)
I called and spoke with pt's daughter (DPR) and she wants to know if it is safe for her mom to have the PET scan on Friday since the pt had a hip replacement that is "mostly healed"  please advise.

## 2023-11-23 NOTE — Telephone Encounter (Signed)
Yes I think she should get it.

## 2023-11-26 ENCOUNTER — Encounter (HOSPITAL_COMMUNITY)
Admission: RE | Admit: 2023-11-26 | Discharge: 2023-11-26 | Disposition: A | Discharge status: Home / Self Care | Payer: Medicare PPO | Source: Ambulatory Visit | Attending: Emergency Medicine | Admitting: Emergency Medicine

## 2023-11-26 DIAGNOSIS — C3490 Malignant neoplasm of unspecified part of unspecified bronchus or lung: Secondary | ICD-10-CM | POA: Diagnosis present

## 2023-11-26 LAB — GLUCOSE, CAPILLARY: Glucose-Capillary: 87 mg/dL (ref 70–99)

## 2023-11-26 MED ORDER — FLUDEOXYGLUCOSE F - 18 (FDG) INJECTION
6.0000 | Freq: Once | INTRAVENOUS | Status: AC | PRN
  Administered 2023-11-26: 5.9 via INTRAVENOUS

## 2023-11-29 ENCOUNTER — Ambulatory Visit: Payer: Self-pay | Admitting: Licensed Clinical Social Worker

## 2023-11-29 NOTE — Patient Instructions (Signed)
Visit Information  Thank you for taking time to visit with me today. Please don't hesitate to contact me if I can be of assistance to you.   Following are the goals we discussed today:   Goals Addressed             This Visit's Progress    Obtain Supportive Resources-Caregiver Strain   On track    Activities and task to complete in order to accomplish goals.   Keep all upcoming appointments discussed today Continue with compliance of taking medication prescribed by Doctor Implement healthy coping skills discussed to assist with management of symptoms. F/up with psychiatrist regarding ongoing depression/anxiety symptoms and medication adjustments Complete new pt ppwk for oral surgeon to go on wait list. Initial appt 12/23/23         Our next appointment is by telephone on 3/3 at 11:30 AM  Please call the care guide team at [phone] if you need to cancel or reschedule your appointment.   If you are experiencing a Mental Health or Behavioral Health Crisis or need someone to talk to, please call the Suicide and Crisis Lifeline: 988 call 911   Patient verbalizes understanding of instructions and care plan provided today and agrees to view in MyChart. Active MyChart status and patient understanding of how to access instructions and care plan via MyChart confirmed with patient.     Windy Fast [facility]  [facility], [facility] Clinical Social Worker Direct Dial: [phone]  Fax: [phone] Website: [email or web address] 4:37 PM

## 2023-11-29 NOTE — Patient Outreach (Signed)
  Care Coordination   Follow Up Visit Note   11/29/2023 Name: Kristen [name] MRN: [id number] DOB: 01/16/1933  Kristen [name] is a 89 y.o. year old female who sees Emilio Aspen, MD for primary care. I spoke with  Inda Merlin daughter, Darl Pikes, by phone today.  What matters to the patients health and wellness today?  Level of Care, Caregiver Stress, Palliative/Hospice, Symptom Management    Goals Addressed             This Visit's Progress    Obtain Supportive Resources-Caregiver Strain   On track    Activities and task to complete in order to accomplish goals.   Keep all upcoming appointments discussed today Continue with compliance of taking medication prescribed by Doctor Implement healthy coping skills discussed to assist with management of symptoms. F/up with psychiatrist regarding ongoing depression/anxiety symptoms and medication adjustments Complete new pt ppwk for oral surgeon to go on wait list. Initial appt 12/23/23         SDOH assessments and interventions completed:  No     Care Coordination Interventions:  Yes, provided  Interventions Today    Flowsheet Row Most Recent Value  Chronic Disease   Chronic disease during today's visit Chronic Kidney Disease/End Stage Renal Disease (ESRD), Hypertension (HTN)  [Chronic Pain Disorder]  General Interventions   General Interventions Discussed/Reviewed General Interventions Reviewed, Doctor Visits, Level of Care  [Family are awaiting PET scan results]  Doctor Visits Discussed/Reviewed Doctor Visits Reviewed  Level of Care Personal Care Services  [facility] concerned for pt's safety in the home. Has been residing with adult daughter since d/c from SNF. Family endorses ongoing difficulty with pt being compliant with PT exercises. Strategies discussed to assist with increasing pt motivation]  Exercise Interventions   Exercise Discussed/Reviewed Exercise Reviewed, Physical Activity  [Pt is participating in PT and OT with  Home Health. Not compliant with exercises throughout the week]  Mental Health Interventions   Mental Health Discussed/Reviewed Mental Health Reviewed, Coping Strategies  [Family endorses ongoing Caregiver Stress. Strategies discussed to assist with management. Validation and encouragement provided]  Nutrition Interventions   Nutrition Discussed/Reviewed Nutrition Reviewed, Decreasing sugar intake  [Family continues to encourage a healthy diet. Pt prefers to drink milk and eat ice cream, despite having an upset stomach afterwards.]  Pharmacy Interventions   Pharmacy Dicussed/Reviewed Pharmacy Topics Reviewed, Medication Adherence  [Pt is compliant with medications]  Safety Interventions   Safety Discussed/Reviewed Safety Reviewed, Fall Risk, Home Safety  [Family is thinking about hiring sitters to promote safety in the home,  however, pt is not open to this currently.]  Advanced Directive Interventions   Advanced Directives Discussed/Reviewed End of Life  End of Life Palliative, Hospice  [LCSW discussed difference between palliative and hospice services. Family will discuss with providers]       Follow up plan: Follow up call scheduled for 2-4 weeks    Encounter Outcome:  Patient Visit Completed   Jenel Lucks, LCSW [place]  [facility], [facility] Clinical Social Worker Direct Dial: [phone]  Fax: [phone] Website: [email or web address] 4:36 PM

## 2023-11-30 ENCOUNTER — Telehealth: Payer: Self-pay | Admitting: Emergency Medicine

## 2023-11-30 NOTE — Telephone Encounter (Signed)
Patient's daughter is calling and would like to know the results of her mother's PET scan when they become available. [phone]

## 2023-11-30 NOTE — Telephone Encounter (Signed)
Daughter would also like to know while her mother is being referred to Urology. She would also like to know if there is a specific doctor that he mother is being referred for her there.

## 2023-12-02 NOTE — Telephone Encounter (Signed)
 Patients daughter is asking for results of the PET Scan. She is also asking as to shy she is being referred to a urologist and not a nephrologist. Please advise.

## 2023-12-03 NOTE — Telephone Encounter (Signed)
 Schedule PET scan results with the patient's daughter Devere by phone.  We discussed the details including mild hypermetabolism in the known enlarging right upper lobe groundglass nodule/mass consistent with adenocarcinoma.  Also discussed findings in a linear opacity in the very peripheral lingula of unclear significance which will need to be followed.  Her renal mass/cystic lesion had reassuring characteristics per the radiology report.  They will decide whether they want to review this with urology to gauge risk. Finally she had some hypermetabolism in the colon without any CT correlate.  Unclear significance but she does deal with nausea, has to take antiemetics daily.  They will discuss and decide whether they would like to talk to radiation oncology about possibly treating the right upper lobe nodule.  We will have to follow the lingular lesion with serial imaging.  We have follow-up in March.

## 2023-12-09 ENCOUNTER — Other Ambulatory Visit: Payer: Self-pay

## 2023-12-09 ENCOUNTER — Encounter (HOSPITAL_BASED_OUTPATIENT_CLINIC_OR_DEPARTMENT_OTHER): Payer: Self-pay | Admitting: Emergency Medicine

## 2023-12-09 ENCOUNTER — Emergency Department (HOSPITAL_BASED_OUTPATIENT_CLINIC_OR_DEPARTMENT_OTHER): Payer: 59

## 2023-12-09 ENCOUNTER — Emergency Department (HOSPITAL_BASED_OUTPATIENT_CLINIC_OR_DEPARTMENT_OTHER)
Admission: EM | Admit: 2023-12-09 | Discharge: 2023-12-09 | Disposition: A | Discharge status: Home / Self Care | Payer: 59 | Attending: Emergency Medicine | Admitting: Emergency Medicine

## 2023-12-09 DIAGNOSIS — S01111A Laceration without foreign body of right eyelid and periocular area, initial encounter: Secondary | ICD-10-CM | POA: Diagnosis not present

## 2023-12-09 DIAGNOSIS — W19XXXA Unspecified fall, initial encounter: Secondary | ICD-10-CM

## 2023-12-09 DIAGNOSIS — W1830XA Fall on same level, unspecified, initial encounter: Secondary | ICD-10-CM | POA: Diagnosis not present

## 2023-12-09 DIAGNOSIS — S0990XA Unspecified injury of head, initial encounter: Secondary | ICD-10-CM | POA: Diagnosis present

## 2023-12-09 MED ORDER — TETANUS-DIPHTH-ACELL PERTUSSIS 5-2.5-18.5 LF-MCG/0.5 IM SUSY
0.5000 mL | PREFILLED_SYRINGE | Freq: Once | INTRAMUSCULAR | Status: DC
Start: 2023-12-09 — End: 2023-12-09
  Filled 2023-12-09: qty 0.5

## 2023-12-09 NOTE — ED Triage Notes (Signed)
Pt reports Rt hip sx 10/30/23, at 1600 pt slipped on the floor at home and fell landing on her RT side, has a small laceration above the rt eye, bleeding controlled, pt is on Eliquis bid, mentating normally in triage, denies HA, reports pain in RT knee and elbow

## 2023-12-09 NOTE — ED Provider Notes (Addendum)
[place] EMERGENCY DEPARTMENT AT [facility] Provider Note   CSN: [id number] Arrival date & time: 12/09/23  1823     History Chief Complaint  Patient presents with   Fall    HPI Kristen [name] is a 89 y.o. female presenting for ground-level fall.  States she was going through the threshold of a doorway lost her balance fell forward hitting her forehead.  Denies fevers chills nausea vomiting syncope shortness of breath..   Patient's recorded medical, surgical, social, medication list and allergies were reviewed in the Snapshot window as part of the initial history.   Review of Systems   Review of Systems  Constitutional:  Negative for chills and fever.  HENT:  Negative for ear pain and sore throat.   Eyes:  Negative for pain and visual disturbance.  Respiratory:  Negative for cough and shortness of breath.   Cardiovascular:  Negative for chest pain and palpitations.  Gastrointestinal:  Negative for abdominal pain and vomiting.  Genitourinary:  Negative for dysuria and hematuria.  Musculoskeletal:  Negative for arthralgias and back pain.  Skin:  Negative for color change and rash.  Neurological:  Negative for seizures and syncope.  All other systems reviewed and are negative.   Physical Exam Updated Vital Signs BP (!) 144/57 (BP Location: Right Arm)   Pulse 64   Temp 97.9 F (36.6 C) (Oral)   Resp (!) 24   Ht 5\' 2"  (1.575 m)   Wt 56 kg   LMP  (LMP Unknown)   SpO2 95%   BMI 22.58 kg/m  Physical Exam Vitals and nursing note reviewed.  Constitutional:      General: She is not in acute distress.    Appearance: She is well-developed.  HENT:     Head: Normocephalic and atraumatic.  Eyes:     Conjunctiva/sclera: Conjunctivae normal.  Cardiovascular:     Rate and Rhythm: Normal rate and regular rhythm.     Heart sounds: No murmur heard. Pulmonary:     Effort: Pulmonary effort is normal. No respiratory distress.     Breath sounds: Normal breath sounds.   Abdominal:     General: There is no distension.     Palpations: Abdomen is soft.     Tenderness: There is no abdominal tenderness. There is no right CVA tenderness or left CVA tenderness.  Musculoskeletal:        General: Signs of injury (<1cm laceration to right eyebrow) present. No swelling or tenderness. Normal range of motion.     Cervical back: Neck supple.  Skin:    General: Skin is warm and dry.  Neurological:     General: No focal deficit present.     Mental Status: She is alert and oriented to person, place, and time. Mental status is at baseline.     Cranial Nerves: No cranial nerve deficit.      ED Course/ Medical Decision Making/ A&P    Procedures .Laceration Repair  Date/Time: 12/09/2023 9:09 PM  Performed by: Glyn Ade, MD Authorized by: Glyn Ade, MD   Consent:    Consent obtained:  Verbal   Consent given by:  Parent   Risks, benefits, and alternatives were discussed: yes   Laceration details:    Length (cm):  1 Treatment:    Amount of cleaning:  Standard Skin repair:    Repair method:  Tissue adhesive Approximation:    Approximation:  Close Repair type:    Repair type:  Simple Post-procedure details:  Procedure completion:  Tolerated    Medications Ordered in ED Medications - No data to display Medical Decision Making:    Kristen [name] is a 89 y.o. female who presented to the ED today with a moderate mechanisma trauma, detailed above.    Additional history discussed with patient's family/caregivers.   Given this mechanism of trauma, a full physical exam was performed. Notably, patient was HDS in NAD.   Reviewed and confirmed nursing documentation for past medical history, family history, social history.    Initial Assessment/Plan:   This is a patient presenting with a moderate mechanism trauma.  As such, I have considered intracranial injuries including intracranial hemorrhage, intrathoracic injuries including blunt myocardial  or blunt lung injury, blunt abdominal injuries including aortic dissection, bladder injury, spleen injury, liver injury and I have considered orthopedic injuries including extremity or spinal injury.   With the patient's presentation of moderate mechanism trauma but an otherwise reassuring exam, patient warrants targeted evaluation for potential traumatic injuries. Will proceed with targeted evaluation for potential injuries. Will proceed with CTH/Cspine and targetted MSK XR. Objective evaluation resulted with NAA.   Final Reassessment and Plan:   Patient's history present on his physical exam findings and not consistent with any acute pathology.  She has a small laceration that I repaired as above. CTs do not show any intracranial hemorrhage nor other acute pathology.  All musculoskeletal joints evaluated and grossly benign at this time.  Patient stable for outpatient care management follow-up with PCP.   Disposition:  I have considered need for hospitalization, however, considering all of the above, I believe this patient is stable for discharge at this time.  Patient/family educated about specific return precautions for given chief complaint and symptoms.  Patient/family educated about follow-up with PCP.     Patient/family expressed understanding of return precautions and need for follow-up. Patient spoken to regarding all imaging and laboratory results and appropriate follow up for these results. All education provided in verbal form with additional information in written form. Time was allowed for answering of patient questions. Patient discharged.    Emergency Department Medication Summary:   Medications - No data to display        Clinical Impression:  1. Fall, initial encounter      Discharge   Final Clinical Impression(s) / ED Diagnoses Final diagnoses:  Fall, initial encounter    Rx / DC Orders ED Discharge Orders     None         Glyn Ade, MD 12/09/23  2109    Glyn Ade, MD 12/09/23 2234

## 2023-12-15 ENCOUNTER — Other Ambulatory Visit: Payer: Self-pay | Admitting: Internal Medicine

## 2023-12-21 ENCOUNTER — Other Ambulatory Visit (HOSPITAL_COMMUNITY): Payer: Self-pay | Admitting: Nurse Practitioner

## 2023-12-21 DIAGNOSIS — R110 Nausea: Secondary | ICD-10-CM

## 2023-12-23 ENCOUNTER — Other Ambulatory Visit: Payer: Self-pay | Admitting: Pharmacist

## 2023-12-23 NOTE — Progress Notes (Signed)
 12/23/2023 Name: Kristen [name] MRN: [id number] DOB: 1932-11-02  Chief Complaint  Patient presents with   Medication Management    Kristen [name] is a 89 y.o. year old female who presented for a home visit.    They were referred to the pharmacist by their PCP for assistance in managing complex medication management.   Patient was originally scheduled to be seen on 11/04/23 after speaking in December, but went to ED on 10/28/23 and 10/29/23.  ED 10/28/23: Abdominal + chest pain; weight loss over the past few months; was told she had a lung nodule and gastritis prior, but found second nodule and increased size in lung; also a new renal mass  ED 10/29/23: Mechanical fall causing right femoral neck fracture leading to hip surgery on 10/30/23  Discharged on 11/04/23 to [facility]; living at [facility]  Discharged on 11/20/23 from [place] to home  Pulmonology visit on 11/18/23: Reviewed images, but not much else can be done; however GI pain may be from renal mass; confirmed adenocarcinoma  PCP visit on 12/01/23: Simplified medications + placed pain clinic referral  ED 12/09/23: Fall trying to get in doorway with laceration on face  WIC on 12/19/23: Nausea + weight gain; changed Zofran dosing   GI on 12/21/23: Getting gastric emptying scan to look at GI issues; showing gastritis    Subjective:  Care Team: Primary Care Provider: Emilio Aspen, MD ; Next Scheduled Visit: 12/01/23 Clinical Pharmacist: Marlowe Aschoff, PharmD  Medication Access/Adherence  Current Pharmacy:  [facility] - [place], [place] - [address] [place] [address] Phone: [phone] Fax: [phone]  [facility] - [facility] [address] [place] [address] Phone: [phone] Fax: [phone]   Patient reports affordability concerns with their medications: No  Patient reports access/transportation concerns to their  pharmacy: No  Patient reports adherence concerns with their medications:  No     Medication Management:  Current adherence strategy: Pill packs for twice a day  Patient reports Good adherence to medications  Patient has been staying with daughter Darl Pikes until her medications can be adjusted appropriately   Objective:  No results found for: "HGBA1C"  Lab Results  Component Value Date   CREATININE 1.07 (H) 11/01/2023   BUN 22 11/01/2023   NA 130 (L) 11/01/2023   K 4.8 11/01/2023   CL 95 (L) 11/01/2023   CO2 22 11/01/2023    No results found for: "CHOL", "HDL", "LDLCALC", "LDLDIRECT", "TRIG", "CHOLHDL"  Medications Reviewed Today     Reviewed by Linna Darner, [facility] (Pharmacist) on 12/23/23 at 1824  Med List Status: <None>   Medication Order Taking? Sig Documenting Provider Last Dose Status Informant  acetaminophen (TYLENOL) 650 MG CR tablet [id number] Yes Take 650 mg by mouth every 8 (eight) hours as needed for pain. [provider] Taking Active   cyanocobalamin (,VITAMIN B-12,) 1000 MCG/ML injection [id number] Yes Inject 100 mcg into the muscle every 30 (thirty) days. [provider] Taking Active Self, Pharmacy Records, Other           Med Note Kandis Cocking [place], [place] A   Fri Oct 29, 2023  8:40 PM)    diclofenac Sodium (VOLTAREN) 1 % GEL [id number] Yes Apply 4 g topically 4 (four) times daily as needed (For pain). [provider] Taking Active   docusate sodium (COLACE) 50 MG capsule [id number] Yes Take 50 mg by mouth daily as needed for mild constipation.  [provider] Taking Active   ELIQUIS 5 MG TABS tablet [id number] Yes Take 5 mg by mouth 2 (two) times daily. [provider] Taking Active Self, Pharmacy Records, Other  esomeprazole (NEXIUM) 40 MG capsule [id number] Yes Take 40 mg by mouth daily. [provider] Taking Active   HYDROmorphone (DILAUDID) 2 MG tablet [id number] Yes Take 1 tablet (2 mg total) by mouth in the  morning and at bedtime.  Patient taking differently: Take 2 mg by mouth 3 (three) times daily as needed for moderate pain (pain score 4-6) or severe pain (pain score 7-10).   Barnetta Chapel, MD Taking Active   hydrOXYzine (ATARAX) 25 MG tablet [id number] Yes Take 25 mg by mouth at bedtime. [provider] Taking Active   losartan (COZAAR) 25 MG tablet [id number] Yes Take 25 mg by mouth daily. [provider] Taking Active   ondansetron (ZOFRAN) 8 MG tablet [id number] Yes Take 16 mg by mouth 3 (three) times daily as needed for nausea or vomiting. [provider] Taking Active   Syringe/Needle, Disp, (SYRINGE 3CC/25GX1-1/2") 25G X 1-1/2" 3 ML MISC [id number] Yes 1 each by Does not apply route every 30 (thirty) days. For B12 injection [provider] Taking Active               Assessment/Plan:   Medication Management: - Currently strategy sufficient to maintain appropriate adherence to prescribed medication regimen - Patient is well organized and can taken them on her own, but needs them prepackaged with [facility] for her scheduled medications    Follow Up Plan:  - Follow-up call added for 01/24/24 to see how the nausea is doing - Consulting with palliative pharmacist regarding nausea options- will call daughter regarding outcomes - Main concern is nausea and weight loss from it currently - Tried compazine 2x ("made her sick"), promethazine suppositories - Restarted with Dr. Donell Beers regarding psych meds- continuing hydroxyzine alone currently - Daughter has some concern nausea is from reduced dosing of Hydromorphone that patient started on her own recently - Does NOT believe she will actually get a colonoscopy completed as well - Has been living back at independent nursing facility for 1 week now   *Also good to note that it is difficult to determine if pain is from stomach, hip, or headache as patient can give vague answers when asked  about cause of pain   Update on ED admission on 3/3: - Patient admitted to hospital on 12/27/23 for nausea  - Would recommend Zofran 8mg  twice a day scheduled, dexamethasone 4mg  daily, and olanzapine 2.5 to 5 mg daily for 5 days to see if this combination helps with nausea  - Recommendation is from Oncology office - Will discuss with patient after hospital discharge   Marlowe Aschoff, PharmD [facility] Phone Number: [phone]

## 2023-12-24 ENCOUNTER — Inpatient Hospital Stay (HOSPITAL_COMMUNITY)
Admission: EM | Admit: 2023-12-24 | Discharge: 2023-12-30 | DRG: 644 | Disposition: A | Discharge status: Home / Self Care | Payer: 59 | Attending: Internal Medicine | Admitting: Internal Medicine

## 2023-12-24 ENCOUNTER — Other Ambulatory Visit: Payer: Self-pay

## 2023-12-24 ENCOUNTER — Encounter (HOSPITAL_COMMUNITY): Payer: Self-pay | Admitting: *Deleted

## 2023-12-24 DIAGNOSIS — Z96641 Presence of right artificial hip joint: Secondary | ICD-10-CM | POA: Diagnosis present

## 2023-12-24 DIAGNOSIS — E871 Hypo-osmolality and hyponatremia: Secondary | ICD-10-CM | POA: Diagnosis not present

## 2023-12-24 DIAGNOSIS — E222 Syndrome of inappropriate secretion of antidiuretic hormone: Principal | ICD-10-CM | POA: Diagnosis present

## 2023-12-24 DIAGNOSIS — Z8719 Personal history of other diseases of the digestive system: Secondary | ICD-10-CM

## 2023-12-24 DIAGNOSIS — Z90710 Acquired absence of both cervix and uterus: Secondary | ICD-10-CM

## 2023-12-24 DIAGNOSIS — Z9049 Acquired absence of other specified parts of digestive tract: Secondary | ICD-10-CM

## 2023-12-24 DIAGNOSIS — Z85118 Personal history of other malignant neoplasm of bronchus and lung: Secondary | ICD-10-CM

## 2023-12-24 DIAGNOSIS — Z96653 Presence of artificial knee joint, bilateral: Secondary | ICD-10-CM | POA: Diagnosis present

## 2023-12-24 DIAGNOSIS — I129 Hypertensive chronic kidney disease with stage 1 through stage 4 chronic kidney disease, or unspecified chronic kidney disease: Secondary | ICD-10-CM | POA: Diagnosis present

## 2023-12-24 DIAGNOSIS — J449 Chronic obstructive pulmonary disease, unspecified: Secondary | ICD-10-CM | POA: Diagnosis present

## 2023-12-24 DIAGNOSIS — C3411 Malignant neoplasm of upper lobe, right bronchus or lung: Secondary | ICD-10-CM | POA: Diagnosis present

## 2023-12-24 DIAGNOSIS — N189 Chronic kidney disease, unspecified: Secondary | ICD-10-CM | POA: Diagnosis present

## 2023-12-24 DIAGNOSIS — E785 Hyperlipidemia, unspecified: Secondary | ICD-10-CM | POA: Diagnosis present

## 2023-12-24 DIAGNOSIS — N281 Cyst of kidney, acquired: Secondary | ICD-10-CM | POA: Diagnosis present

## 2023-12-24 DIAGNOSIS — Z880 Allergy status to penicillin: Secondary | ICD-10-CM

## 2023-12-24 DIAGNOSIS — Z79891 Long term (current) use of opiate analgesic: Secondary | ICD-10-CM

## 2023-12-24 DIAGNOSIS — G894 Chronic pain syndrome: Secondary | ICD-10-CM | POA: Diagnosis present

## 2023-12-24 DIAGNOSIS — D630 Anemia in neoplastic disease: Secondary | ICD-10-CM | POA: Diagnosis present

## 2023-12-24 DIAGNOSIS — Z882 Allergy status to sulfonamides status: Secondary | ICD-10-CM

## 2023-12-24 DIAGNOSIS — Z66 Do not resuscitate: Secondary | ICD-10-CM | POA: Diagnosis present

## 2023-12-24 DIAGNOSIS — K3189 Other diseases of stomach and duodenum: Secondary | ICD-10-CM | POA: Diagnosis present

## 2023-12-24 DIAGNOSIS — E538 Deficiency of other specified B group vitamins: Secondary | ICD-10-CM | POA: Diagnosis present

## 2023-12-24 DIAGNOSIS — Z881 Allergy status to other antibiotic agents status: Secondary | ICD-10-CM

## 2023-12-24 DIAGNOSIS — E861 Hypovolemia: Secondary | ICD-10-CM | POA: Diagnosis present

## 2023-12-24 DIAGNOSIS — K317 Polyp of stomach and duodenum: Secondary | ICD-10-CM | POA: Diagnosis present

## 2023-12-24 DIAGNOSIS — C3490 Malignant neoplasm of unspecified part of unspecified bronchus or lung: Secondary | ICD-10-CM | POA: Insufficient documentation

## 2023-12-24 DIAGNOSIS — G500 Trigeminal neuralgia: Secondary | ICD-10-CM | POA: Diagnosis present

## 2023-12-24 DIAGNOSIS — R519 Headache, unspecified: Secondary | ICD-10-CM | POA: Diagnosis present

## 2023-12-24 DIAGNOSIS — F32A Depression, unspecified: Secondary | ICD-10-CM | POA: Diagnosis present

## 2023-12-24 DIAGNOSIS — K219 Gastro-esophageal reflux disease without esophagitis: Secondary | ICD-10-CM | POA: Diagnosis present

## 2023-12-24 DIAGNOSIS — R110 Nausea: Secondary | ICD-10-CM | POA: Diagnosis not present

## 2023-12-24 DIAGNOSIS — Z7901 Long term (current) use of anticoagulants: Secondary | ICD-10-CM

## 2023-12-24 DIAGNOSIS — B9620 Unspecified Escherichia coli [E. coli] as the cause of diseases classified elsewhere: Secondary | ICD-10-CM | POA: Diagnosis not present

## 2023-12-24 DIAGNOSIS — R1115 Cyclical vomiting syndrome unrelated to migraine: Secondary | ICD-10-CM | POA: Diagnosis present

## 2023-12-24 DIAGNOSIS — I10 Essential (primary) hypertension: Secondary | ICD-10-CM | POA: Diagnosis present

## 2023-12-24 DIAGNOSIS — F419 Anxiety disorder, unspecified: Secondary | ICD-10-CM | POA: Diagnosis present

## 2023-12-24 DIAGNOSIS — Z6820 Body mass index (BMI) 20.0-20.9, adult: Secondary | ICD-10-CM

## 2023-12-24 DIAGNOSIS — R634 Abnormal weight loss: Secondary | ICD-10-CM | POA: Diagnosis present

## 2023-12-24 DIAGNOSIS — N390 Urinary tract infection, site not specified: Secondary | ICD-10-CM | POA: Diagnosis not present

## 2023-12-24 DIAGNOSIS — Z79899 Other long term (current) drug therapy: Secondary | ICD-10-CM

## 2023-12-24 DIAGNOSIS — Z86711 Personal history of pulmonary embolism: Secondary | ICD-10-CM

## 2023-12-24 DIAGNOSIS — Z604 Social exclusion and rejection: Secondary | ICD-10-CM | POA: Diagnosis present

## 2023-12-24 LAB — COMPREHENSIVE METABOLIC PANEL
ALT: 13 U/L (ref 0–44)
AST: 19 U/L (ref 15–41)
Albumin: 3.7 g/dL (ref 3.5–5.0)
Alkaline Phosphatase: 71 U/L (ref 38–126)
Anion gap: 8 (ref 5–15)
BUN: 23 mg/dL (ref 8–23)
CO2: 24 mmol/L (ref 22–32)
Calcium: 8.9 mg/dL (ref 8.9–10.3)
Chloride: 96 mmol/L — ABNORMAL LOW (ref 98–111)
Creatinine, Ser: 1 mg/dL (ref 0.44–1.00)
GFR, Estimated: 54 mL/min — ABNORMAL LOW (ref >60)
Glucose, Bld: 101 mg/dL — ABNORMAL HIGH (ref 70–99)
Potassium: 4.7 mmol/L (ref 3.5–5.1)
Sodium: 128 mmol/L — ABNORMAL LOW (ref 135–145)
Total Bilirubin: 0.8 mg/dL (ref 0.0–1.2)
Total Protein: 6.4 g/dL — ABNORMAL LOW (ref 6.5–8.1)

## 2023-12-24 LAB — CBC WITH DIFFERENTIAL/PLATELET
Abs Immature Granulocytes: 0.02 10*3/uL (ref 0.00–0.07)
Basophils Absolute: 0 10*3/uL (ref 0.0–0.1)
Basophils Relative: 0 %
Eosinophils Absolute: 0.1 10*3/uL (ref 0.0–0.5)
Eosinophils Relative: 1 %
HCT: 32.2 % — ABNORMAL LOW (ref 36.0–46.0)
Hemoglobin: 11.1 g/dL — ABNORMAL LOW (ref 12.0–15.0)
Immature Granulocytes: 0 %
Lymphocytes Relative: 24 %
Lymphs Abs: 1.8 10*3/uL (ref 0.7–4.0)
MCH: 32.4 pg (ref 26.0–34.0)
MCHC: 34.5 g/dL (ref 30.0–36.0)
MCV: 93.9 fL (ref 80.0–100.0)
Monocytes Absolute: 0.6 10*3/uL (ref 0.1–1.0)
Monocytes Relative: 8 %
Neutro Abs: 4.9 10*3/uL (ref 1.7–7.7)
Neutrophils Relative %: 67 %
Platelets: 212 10*3/uL (ref 150–400)
RBC: 3.43 MIL/uL — ABNORMAL LOW (ref 3.87–5.11)
RDW: 13 % (ref 11.5–15.5)
WBC: 7.4 10*3/uL (ref 4.0–10.5)
nRBC: 0 % (ref 0.0–0.2)

## 2023-12-24 LAB — BASIC METABOLIC PANEL
Anion gap: 7 (ref 5–15)
BUN: 18 mg/dL (ref 8–23)
CO2: 22 mmol/L (ref 22–32)
Calcium: 8.2 mg/dL — ABNORMAL LOW (ref 8.9–10.3)
Chloride: 100 mmol/L (ref 98–111)
Creatinine, Ser: 0.92 mg/dL (ref 0.44–1.00)
GFR, Estimated: 60 mL/min — ABNORMAL LOW (ref >60)
Glucose, Bld: 104 mg/dL — ABNORMAL HIGH (ref 70–99)
Potassium: 4.1 mmol/L (ref 3.5–5.1)
Sodium: 129 mmol/L — ABNORMAL LOW (ref 135–145)

## 2023-12-24 LAB — TROPONIN I (HIGH SENSITIVITY): Troponin I (High Sensitivity): 6 ng/L (ref <18)

## 2023-12-24 LAB — LIPASE, BLOOD: Lipase: 36 U/L (ref 11–51)

## 2023-12-24 MED ORDER — ACETAMINOPHEN 650 MG RE SUPP: 650.0000 mg | Freq: Four times a day (QID) | RECTAL | Status: DC | PRN

## 2023-12-24 MED ORDER — ACETAMINOPHEN 325 MG PO TABS
650.0000 mg | ORAL_TABLET | Freq: Four times a day (QID) | ORAL | Status: DC | PRN
  Administered 2023-12-24 – 2023-12-29 (×7): 650 mg via ORAL
  Filled 2023-12-24 (×6): qty 2

## 2023-12-24 MED ORDER — PANTOPRAZOLE SODIUM 40 MG PO TBEC
40.0000 mg | DELAYED_RELEASE_TABLET | Freq: Every day | ORAL | Status: DC
  Administered 2023-12-24 – 2023-12-30 (×6): 40 mg via ORAL
  Filled 2023-12-24 (×7): qty 1

## 2023-12-24 MED ORDER — SODIUM CHLORIDE 0.9 % IV SOLN: INTRAVENOUS | Status: AC

## 2023-12-24 MED ORDER — METOCLOPRAMIDE HCL 5 MG/ML IJ SOLN
5.0000 mg | Freq: Once | INTRAMUSCULAR | Status: AC
Start: 2023-12-24 — End: 2023-12-24
  Administered 2023-12-24: 5 mg via INTRAVENOUS
  Filled 2023-12-24: qty 2

## 2023-12-24 MED ORDER — ENOXAPARIN SODIUM 40 MG/0.4ML IJ SOSY: 40.0000 mg | PREFILLED_SYRINGE | INTRAMUSCULAR | Status: DC

## 2023-12-24 MED ORDER — ONDANSETRON HCL 4 MG PO TABS
4.0000 mg | ORAL_TABLET | Freq: Four times a day (QID) | ORAL | Status: DC | PRN
  Administered 2023-12-25 – 2023-12-29 (×2): 4 mg via ORAL
  Filled 2023-12-24 (×2): qty 1

## 2023-12-24 MED ORDER — SODIUM CHLORIDE 0.9 % IV BOLUS
1000.0000 mL | Freq: Once | INTRAVENOUS | Status: AC
  Administered 2023-12-24: 1000 mL via INTRAVENOUS

## 2023-12-24 MED ORDER — DIPHENHYDRAMINE HCL 50 MG/ML IJ SOLN
12.5000 mg | Freq: Once | INTRAMUSCULAR | Status: AC
  Administered 2023-12-24: 12.5 mg via INTRAVENOUS
  Filled 2023-12-24: qty 1

## 2023-12-24 MED ORDER — APIXABAN 5 MG PO TABS
5.0000 mg | ORAL_TABLET | Freq: Two times a day (BID) | ORAL | Status: DC
  Administered 2023-12-24 – 2023-12-25 (×2): 5 mg via ORAL
  Filled 2023-12-24 (×2): qty 1

## 2023-12-24 MED ORDER — ONDANSETRON HCL 4 MG/2ML IJ SOLN
4.0000 mg | Freq: Four times a day (QID) | INTRAMUSCULAR | Status: DC | PRN
  Administered 2023-12-30: 4 mg via INTRAVENOUS
  Filled 2023-12-24: qty 2

## 2023-12-24 MED ORDER — HYDROMORPHONE HCL 2 MG PO TABS
2.0000 mg | ORAL_TABLET | Freq: Three times a day (TID) | ORAL | Status: DC | PRN
  Administered 2023-12-24 – 2023-12-30 (×11): 2 mg via ORAL
  Filled 2023-12-24 (×12): qty 1

## 2023-12-24 MED ORDER — ALUM & MAG HYDROXIDE-SIMETH 200-200-20 MG/5ML PO SUSP
30.0000 mL | Freq: Once | ORAL | Status: AC
  Administered 2023-12-24: 30 mL via ORAL
  Filled 2023-12-24: qty 30

## 2023-12-24 MED ORDER — HYDROXYZINE HCL 25 MG PO TABS
25.0000 mg | ORAL_TABLET | Freq: Every day | ORAL | Status: DC
  Administered 2023-12-24 – 2023-12-29 (×6): 25 mg via ORAL
  Filled 2023-12-24 (×6): qty 1

## 2023-12-24 MED ORDER — ALBUTEROL SULFATE (2.5 MG/3ML) 0.083% IN NEBU: 2.5000 mg | INHALATION_SOLUTION | RESPIRATORY_TRACT | Status: DC | PRN

## 2023-12-24 MED ORDER — TRAZODONE HCL 50 MG PO TABS
25.0000 mg | ORAL_TABLET | Freq: Every evening | ORAL | Status: DC | PRN
  Administered 2023-12-24 – 2023-12-29 (×6): 25 mg via ORAL
  Filled 2023-12-24 (×7): qty 1

## 2023-12-24 MED ORDER — METOCLOPRAMIDE HCL 5 MG/ML IJ SOLN
5.0000 mg | Freq: Once | INTRAMUSCULAR | Status: AC
  Administered 2023-12-24: 5 mg via INTRAVENOUS
  Filled 2023-12-24: qty 2

## 2023-12-24 NOTE — ED Notes (Signed)
 Pt ambulated to restroom with stand by assist.

## 2023-12-24 NOTE — ED Provider Notes (Signed)
 [name]   CSN: [id number] Arrival date & time: 12/24/23  4098     History  Chief Complaint  Patient presents with   Nausea    Kristen [name] is a 89 y.o. female.  89 yo F with a chief complaints of nausea.  She tells me that this has been going on for a while but she feels like it is getting worse.  Tells me that she is having trouble eating or drinking at all.  No vomiting.  She has seen her gastroenterologist a few days ago and they had changed the dosing of her medication but without improvement.  She denies abdominal pain denies chest pain denies shortness of breath.        Home Medications Prior to Admission medications   Medication Sig Start Date End Date Taking? Authorizing Provider  acetaminophen (TYLENOL) 650 MG CR tablet Take 650 mg by mouth every 8 (eight) hours as needed for pain.    [provider]  cyanocobalamin (,VITAMIN B-12,) 1000 MCG/ML injection Inject 100 mcg into the muscle every 30 (thirty) days. 11/02/19   [provider]  diclofenac Sodium (VOLTAREN) 1 % GEL Apply 4 g topically 4 (four) times daily as needed (For pain).    [provider]  docusate sodium (COLACE) 50 MG capsule Take 50 mg by mouth daily as needed for mild constipation.    [provider]  ELIQUIS 5 MG TABS tablet Take 5 mg by mouth 2 (two) times daily. 09/28/22   [provider]  esomeprazole (NEXIUM) 40 MG capsule Take 40 mg by mouth daily.    [provider]  HYDROmorphone (DILAUDID) 2 MG tablet Take 1 tablet (2 mg total) by mouth in the morning and at bedtime. Patient taking differently: Take 2 mg by mouth 3 (three) times daily as needed for moderate pain (pain score 4-6) or severe pain (pain score 7-10). 11/04/23   Barnetta Chapel, MD  hydrOXYzine (ATARAX) 25 MG tablet Take 25 mg by mouth at bedtime.    [provider]  losartan (COZAAR) 25 MG tablet Take 25 mg by  mouth daily.    [provider]  ondansetron (ZOFRAN) 8 MG tablet Take 16 mg by mouth 3 (three) times daily as needed for nausea or vomiting.    [provider]  Syringe/Needle, Disp, (SYRINGE 3CC/25GX1-1/2") 25G X 1-1/2" 3 ML MISC 1 each by Does not apply route every 30 (thirty) days. For B12 injection    [provider]      Allergies    Penicillins, Tetracyclines & related, and Sulfa antibiotics    Review of Systems   Review of Systems  Physical Exam Updated Vital Signs BP 130/85 (BP Location: Right Arm)   Pulse 60   Temp 97.6 F (36.4 C)   Resp 15   Wt 56 kg   LMP  (LMP Unknown)   SpO2 100%   BMI 22.58 kg/m  Physical Exam Vitals and nursing note reviewed.  Constitutional:      General: She is not in acute distress.    Appearance: She is well-developed. She is not diaphoretic.  HENT:     Head: Normocephalic and atraumatic.  Eyes:     Pupils: Pupils are equal, round, and reactive to light.  Cardiovascular:     Rate and Rhythm: Normal rate and regular rhythm.     Heart sounds: No murmur heard.    No friction rub. No gallop.  Pulmonary:     Effort: Pulmonary effort is normal.     Breath sounds: No wheezing or rales.  Abdominal:     General: There is no distension.     Palpations: Abdomen is soft.     Tenderness: There is no abdominal tenderness.  Musculoskeletal:        General: No tenderness.     Cervical back: Normal range of motion and neck supple.  Skin:    General: Skin is warm and dry.  Neurological:     Mental Status: She is alert and oriented to person, place, and time.  Psychiatric:        Behavior: Behavior normal.     ED Results / Procedures / Treatments   Labs (all labs ordered are listed, but only abnormal results are displayed) Labs Reviewed  CBC WITH DIFFERENTIAL/PLATELET - Abnormal; Notable for the following components:      Result Value   RBC 3.43 (*)    Hemoglobin 11.1 (*)    HCT 32.2 (*)    All other  components within normal limits  COMPREHENSIVE METABOLIC PANEL - Abnormal; Notable for the following components:   Sodium 128 (*)    Chloride 96 (*)    Glucose, Bld 101 (*)    Total Protein 6.4 (*)    GFR, Estimated 54 (*)    All other components within normal limits  LIPASE, BLOOD  BASIC METABOLIC PANEL  TROPONIN I (HIGH SENSITIVITY)    EKG EKG Interpretation Date/Time:  Friday December 24 2023 10:33:34 EST Ventricular Rate:  55 PR Interval:  183 QRS Duration:  83 QT Interval:  439 QTC Calculation: 420 R Axis:   49  Text Interpretation: Sinus rhythm Minimal ST elevation, inferior leads No significant change since last tracing Confirmed by Melene Plan [phone]) on 12/24/2023 10:43:34 AM  Radiology No results found.  Procedures Procedures    Medications Ordered in ED Medications  0.9 %  sodium chloride infusion ( Intravenous New Bag/Given 12/24/23 1347)  acetaminophen (TYLENOL) tablet 650 mg (has no administration in time range)    Or  acetaminophen (TYLENOL) suppository 650 mg (has no administration in time range)  traZODone (DESYREL) tablet 25 mg (has no administration in time range)  ondansetron (ZOFRAN) tablet 4 mg (has no administration in time range)    Or  ondansetron (ZOFRAN) injection 4 mg (has no administration in time range)  albuterol (PROVENTIL) (2.5 MG/3ML) 0.083% nebulizer solution 2.5 mg (has no administration in time range)  hydrOXYzine (ATARAX) tablet 25 mg (has no administration in time range)  HYDROmorphone (DILAUDID) tablet 2 mg (has no administration in time range)  pantoprazole (PROTONIX) EC tablet 40 mg (has no administration in time range)  apixaban (ELIQUIS) tablet 5 mg (has no administration in time range)  metoCLOPramide (REGLAN) injection 5 mg (5 mg Intravenous Given 12/24/23 1131)  diphenhydrAMINE (BENADRYL) injection 12.5 mg (12.5 mg Intravenous Given 12/24/23 1130)  alum & mag hydroxide-simeth (MAALOX/MYLANTA) 200-200-20 MG/5ML suspension 30 mL  (30 mLs Oral Given 12/24/23 1310)  sodium chloride 0.9 % bolus 1,000 mL (1,000 mLs Intravenous New Bag/Given 12/24/23 1304)  metoCLOPramide (REGLAN) injection 5 mg (5 mg Intravenous Given 12/24/23 1258)  diphenhydrAMINE (BENADRYL) injection 12.5 mg (12.5 mg Intravenous Given 12/24/23 1258)    ED Course/ Medical Decision Making/ A&P                                 Medical Decision Making  Amount and/or Complexity of Data Reviewed Labs: ordered. ECG/medicine tests: ordered.  Risk OTC drugs. Prescription drug management. Decision regarding hospitalization.   89 yo F with a chief complaints of nausea.  On my record review this been going on for quite some time now.  She sees gastroenterology for this.  Is on Zofran at home and just saw GI 3 days ago and had her dose increased.  She denies abdominal pain denies chest pain.  She also just had broken her hip and required replacement done on 4 January.  Have been in rehab and recently has been home.  Patient was given a dose of Reglan and feels like her nausea may be slightly improved but does not feel well enough to try and eat or drink anything.  Lab work without leukocytosis, hyponatremia hypochloremia that the patient has had chronically but is slowly downward trending on my record review.  Will discuss with medicine for possible admission for dehydration and ongoing fluid administration.  The patients results and plan were reviewed and discussed.   Any x-rays performed were independently reviewed by myself.   Differential diagnosis were considered with the presenting HPI.  Medications  0.9 %  sodium chloride infusion ( Intravenous New Bag/Given 12/24/23 1347)  acetaminophen (TYLENOL) tablet 650 mg (has no administration in time range)    Or  acetaminophen (TYLENOL) suppository 650 mg (has no administration in time range)  traZODone (DESYREL) tablet 25 mg (has no administration in time range)  ondansetron (ZOFRAN) tablet 4 mg (has no  administration in time range)    Or  ondansetron (ZOFRAN) injection 4 mg (has no administration in time range)  albuterol (PROVENTIL) (2.5 MG/3ML) 0.083% nebulizer solution 2.5 mg (has no administration in time range)  hydrOXYzine (ATARAX) tablet 25 mg (has no administration in time range)  HYDROmorphone (DILAUDID) tablet 2 mg (has no administration in time range)  pantoprazole (PROTONIX) EC tablet 40 mg (has no administration in time range)  apixaban (ELIQUIS) tablet 5 mg (has no administration in time range)  metoCLOPramide (REGLAN) injection 5 mg (5 mg Intravenous Given 12/24/23 1131)  diphenhydrAMINE (BENADRYL) injection 12.5 mg (12.5 mg Intravenous Given 12/24/23 1130)  alum & mag hydroxide-simeth (MAALOX/MYLANTA) 200-200-20 MG/5ML suspension 30 mL (30 mLs Oral Given 12/24/23 1310)  sodium chloride 0.9 % bolus 1,000 mL (1,000 mLs Intravenous New Bag/Given 12/24/23 1304)  metoCLOPramide (REGLAN) injection 5 mg (5 mg Intravenous Given 12/24/23 1258)  diphenhydrAMINE (BENADRYL) injection 12.5 mg (12.5 mg Intravenous Given 12/24/23 1258)    Vitals:   12/24/23 1004 12/24/23 1005 12/24/23 1300  BP: (!) 141/47  130/85  Pulse: 64  60  Resp: 15  15  Temp: 97.6 F (36.4 C)  97.6 F (36.4 C)  TempSrc: Oral    SpO2: 100%  100%  Weight:  56 kg     Final diagnoses:  Hyponatremia  Nausea without vomiting    Admission/ observation were discussed with the admitting physician, patient and/or family and they are comfortable with the plan.          Final Clinical Impression(s) / ED Diagnoses Final diagnoses:  Hyponatremia  Nausea without vomiting    Rx / DC Orders ED Discharge Orders     None         Melene Plan, DO 12/24/23 1507

## 2023-12-24 NOTE — H&P (Signed)
 History and Physical  [name] AOZ:[id number] DOB: [birth year] DOA: 12/24/2023  PCP: Emilio Aspen, MD   Chief Complaint: Nausea  HPI: Kristen [name] is a 89 y.o. female with medical history significant for COPD, chronic pain disorder on p.o. Dilaudid, hypertension, hyperlipoidemia, depression being admitted to the hospital for mild hyponatremia in the setting of chronic nausea without vomiting.  History provided by the patient as well as her daughter who is at the bedside.  Patient seems to get confused intermittently, not a very good historian but her daughter at the bedside is a good historian.  It seems that she has been dealing with chronic nausea that affects her nearly every day, since about last August.  Patient tells me she has been having nausea since she broke her hip, which was in January of this year.  Daughter states that the patient was started on Cymbalta by her PCP early last summer, did not seem to tolerate this well, with a lot of agitation and nausea, this medication was discontinued [place] psych physician started her on Effexor.  Once again she had nausea and agitation, so she was weaned off of this medication and is currently not on any antidepressants.  However since that time, they feel that she has continued to have chronic daily nausea.  Patient states that she does not really have any days without nausea.  She often wakes up in the morning and has nausea immediately.  At the same time, also states that p.o. intake seems to make her nausea worse, does not matter whether it is liquids or solids.  She denies any regurgitation, or feeling that food gets stuck in her chest.  Although she is on chronic narcotics, she does have a normal daily bowel movement pretty much every morning.  She denies reflux symptoms.  She is followed by [facility], has seen them a couple times recently.  She had an outpatient PET scan due to a left lung nodule, which revealed some nonspecific activity in  her abdomen and so GI has discussed a possible colonoscopy.  In the meantime, due to her chronic nausea they decided just this week that they were going to order a gastric emptying study.  She has never had an upper endoscopy.  Due to continued severe nausea, EMS was called today, workup as detailed below is nonacute but she does have hyponatremia 128.  ER provider discussed with Dr. Dulce Sellar with gastroenterology who will see the patient in consultation, and hospitalist was contacted for admission.  Review of Systems: Please see HPI for pertinent positives and negatives. A complete 10 system review of systems are otherwise negative.  Past Medical History:  Diagnosis Date   Anemia    Arthritis    B12 deficiency    Chronic kidney disease    Chronic pain disorder    trigeminal neuralgia   Depression    Dyslipidemia    Hypertension    PONV (postoperative nausea and vomiting)    Seizure disorder (HCC)    decades since last seizure   Tremor    Trigeminal nerve disorder    Past Surgical History:  Procedure Laterality Date   ABDOMINAL HYSTERECTOMY     BLADDER REPAIR     tack   CATARACT EXTRACTION     CHOLECYSTECTOMY     gamma knife  2004   NOSE SURGERY     for fracture   SHOULDER ARTHROSCOPY DISTAL CLAVICLE EXCISION AND OPEN ROTATOR CUFF REPAIR Left    rotary cuff repair  TONSILLECTOMY     TOTAL HIP ARTHROPLASTY Right 10/30/2023   Procedure: TOTAL HIP ARTHROPLASTY ANTERIOR APPROACH;  Surgeon: Durene Romans, MD;  Location: [facility] OR;  Service: Orthopedics;  Laterality: Right;   TOTAL KNEE ARTHROPLASTY Bilateral    UMBILICAL HERNIA REPAIR     Social History:  reports that she has never smoked. She has never been exposed to tobacco smoke. She has never used smokeless tobacco. She reports that she does not currently use alcohol. She reports that she does not use drugs.  Allergies  Allergen Reactions   Penicillins Hives and Rash    Other reaction(s): Other unknown    Tetracyclines &  Related Anaphylaxis    Other reaction(s): Other unknown    Sulfa Antibiotics Hives and Other (See Comments)    unknown    History reviewed. No pertinent family history.   Prior to Admission medications   Medication Sig Start Date End Date Taking? Authorizing Provider  acetaminophen (TYLENOL) 650 MG CR tablet Take 650 mg by mouth every 8 (eight) hours as needed for pain.    [provider]  cyanocobalamin (,VITAMIN B-12,) 1000 MCG/ML injection Inject 100 mcg into the muscle every 30 (thirty) days. 11/02/19   [provider]  diclofenac Sodium (VOLTAREN) 1 % GEL Apply 4 g topically 4 (four) times daily as needed (For pain).    [provider]  docusate sodium (COLACE) 50 MG capsule Take 50 mg by mouth daily as needed for mild constipation.    [provider]  ELIQUIS 5 MG TABS tablet Take 5 mg by mouth 2 (two) times daily. 09/28/22   [provider]  esomeprazole (NEXIUM) 40 MG capsule Take 40 mg by mouth daily.    [provider]  HYDROmorphone (DILAUDID) 2 MG tablet Take 1 tablet (2 mg total) by mouth in the morning and at bedtime. Patient taking differently: Take 2 mg by mouth 3 (three) times daily as needed for moderate pain (pain score 4-6) or severe pain (pain score 7-10). 11/04/23   Barnetta Chapel, MD  hydrOXYzine (ATARAX) 25 MG tablet Take 25 mg by mouth at bedtime.    [provider]  losartan (COZAAR) 25 MG tablet Take 25 mg by mouth daily.    [provider]  ondansetron (ZOFRAN) 8 MG tablet Take 16 mg by mouth 3 (three) times daily as needed for nausea or vomiting.    [provider]  Syringe/Needle, Disp, (SYRINGE 3CC/25GX1-1/2") 25G X 1-1/2" 3 ML MISC 1 each by Does not apply route every 30 (thirty) days. For B12 injection    [provider]    Physical Exam: BP 130/85 (BP Location: Right Arm)   Pulse 60   Temp 97.6 F (36.4 C)   Resp 15   Wt 56 kg   LMP  (LMP Unknown)   SpO2 100%    BMI 22.58 kg/m  General: Elderly female appearing her stated age, she is alert, however she is oriented only to self and place.  Her daughter is at the bedside.  Patient looks overall dry on exam. Cardiovascular: Irregularly irregular, no murmurs or rubs, no peripheral edema  Respiratory: clear to auscultation bilaterally, no wheezes, no crackles  Abdomen: soft, nontender, nondistended, normal bowel tones heard  Skin: dry, no rashes  Musculoskeletal: no joint effusions, normal range of motion  Psychiatric: appropriate affect, normal speech  Neurologic: extraocular muscles intact, clear speech, moving all extremities with intact sensorium         Labs on  Admission:  Basic Metabolic Panel: Recent Labs  Lab 12/24/23 1032  NA 128*  K 4.7  CL 96*  CO2 24  GLUCOSE 101*  BUN 23  CREATININE 1.00  CALCIUM 8.9   Liver Function Tests: Recent Labs  Lab 12/24/23 1032  AST 19  ALT 13  ALKPHOS 71  BILITOT 0.8  PROT 6.4*  ALBUMIN 3.7   Recent Labs  Lab 12/24/23 1032  LIPASE 36   No results for input(s): "AMMONIA" in the last 168 hours. CBC: Recent Labs  Lab 12/24/23 1032  WBC 7.4  NEUTROABS 4.9  HGB 11.1*  HCT 32.2*  MCV 93.9  PLT 212   Cardiac Enzymes: No results for input(s): "CKTOTAL", "CKMB", "CKMBINDEX", "TROPONINI" in the last 168 hours. BNP (last 3 results) No results for input(s): "BNP" in the last 8760 hours.  ProBNP (last 3 results) No results for input(s): "PROBNP" in the last 8760 hours.  CBG: No results for input(s): "GLUCAP" in the last 168 hours.  Radiological Exams on Admission: No results found.  Assessment/Plan Kristen [name] is a 89 y.o. female with medical history significant for COPD, chronic pain disorder on p.o. Dilaudid, hypertension, hyperlipoidemia, depression being admitted to the hospital for mild hyponatremia in the setting of chronic nausea without vomiting.  Hyponatremia-mild and asymptomatic, this is likely a hypovolemic  hyponatremia given the patient's history of chronic nausea, very poor intake. -Observation admission -Hydrate with normal saline -Monitor BMP intermittently  Chronic nausea-unclear etiology at this time, recent imaging shows evidence of some gastritis, but at this time differential is broad as it could be medication related, CNS related, due to gastritis/esophagitis, gastric ulceration, etc.  She has been seen in outpatient by [facility].  There are plans for outpatient gastric emptying study.  I think she may benefit from EGD as well. -Appreciate inpatient GI consultation -Continue regular diet for now as tolerated, as the patient has no vomiting and no obstructive symptoms  History of PE-continue Eliquis  Essential hypertension-continue home Norvasc  Anxiety and depression-has been weaned off Effexor, continue nightly hydroxyzine    Code Status: Do not attempt resuscitation (DNR) PRE-ARREST INTERVENTIONS DESIRED  Consults called: None  Admission status: Observation   Time spent: 48 minutes  Kristen [name] Sharlette Dense MD [facility] Pager [phone]  If 7PM-7AM, please contact night-coverage [email or web address] Password [email or web address]  12/24/2023, 2:20 PM

## 2023-12-24 NOTE — ED Notes (Signed)
 Patient ambulated to bathroom.

## 2023-12-24 NOTE — ED Notes (Signed)
Pt ambulated to the bathroom with assistance 

## 2023-12-24 NOTE — ED Triage Notes (Signed)
 BIB EMS after developing worsening nausea. Zofran ODT 4mg  given with assist of EMS. Pt has had poor appetite with food and liquids. No diarrhea reported.  100/68-64-22-99% RA CBG 149

## 2023-12-25 DIAGNOSIS — R948 Abnormal results of function studies of other organs and systems: Secondary | ICD-10-CM

## 2023-12-25 DIAGNOSIS — R110 Nausea: Secondary | ICD-10-CM

## 2023-12-25 DIAGNOSIS — E871 Hypo-osmolality and hyponatremia: Secondary | ICD-10-CM | POA: Diagnosis not present

## 2023-12-25 LAB — BASIC METABOLIC PANEL
Anion gap: 7 (ref 5–15)
BUN: 16 mg/dL (ref 8–23)
CO2: 25 mmol/L (ref 22–32)
Calcium: 8.8 mg/dL — ABNORMAL LOW (ref 8.9–10.3)
Chloride: 98 mmol/L (ref 98–111)
Creatinine, Ser: 0.96 mg/dL (ref 0.44–1.00)
GFR, Estimated: 57 mL/min — ABNORMAL LOW (ref >60)
Glucose, Bld: 94 mg/dL (ref 70–99)
Potassium: 3.8 mmol/L (ref 3.5–5.1)
Sodium: 130 mmol/L — ABNORMAL LOW (ref 135–145)

## 2023-12-25 LAB — SODIUM, URINE, RANDOM: Sodium, Ur: 111 mmol/L

## 2023-12-25 LAB — OSMOLALITY, URINE: Osmolality, Ur: 387 mosm/kg (ref 300–900)

## 2023-12-25 LAB — CBC
HCT: 32.7 % — ABNORMAL LOW (ref 36.0–46.0)
Hemoglobin: 10.8 g/dL — ABNORMAL LOW (ref 12.0–15.0)
MCH: 31.9 pg (ref 26.0–34.0)
MCHC: 33 g/dL (ref 30.0–36.0)
MCV: 96.5 fL (ref 80.0–100.0)
Platelets: 197 10*3/uL (ref 150–400)
RBC: 3.39 MIL/uL — ABNORMAL LOW (ref 3.87–5.11)
RDW: 12.8 % (ref 11.5–15.5)
WBC: 5.8 10*3/uL (ref 4.0–10.5)
nRBC: 0 % (ref 0.0–0.2)

## 2023-12-25 LAB — TSH: TSH: 1.031 u[IU]/mL (ref 0.350–4.500)

## 2023-12-25 LAB — CORTISOL: Cortisol, Plasma: 15.5 ug/dL

## 2023-12-25 MED ORDER — APIXABAN 2.5 MG PO TABS: 2.5000 mg | ORAL_TABLET | Freq: Two times a day (BID) | ORAL | Status: DC

## 2023-12-25 MED ORDER — AMLODIPINE BESYLATE 5 MG PO TABS
5.0000 mg | ORAL_TABLET | Freq: Every day | ORAL | Status: DC
  Administered 2023-12-25: 5 mg via ORAL
  Filled 2023-12-25: qty 1

## 2023-12-25 MED ORDER — APIXABAN 5 MG PO TABS
5.0000 mg | ORAL_TABLET | Freq: Two times a day (BID) | ORAL | Status: DC
  Administered 2023-12-25 – 2023-12-26 (×3): 5 mg via ORAL
  Filled 2023-12-25 (×4): qty 1

## 2023-12-25 MED ORDER — HYDROMORPHONE HCL 2 MG PO TABS
2.0000 mg | ORAL_TABLET | Freq: Once | ORAL | Status: AC
  Administered 2023-12-25: 2 mg via ORAL
  Filled 2023-12-25: qty 1

## 2023-12-25 NOTE — ED Notes (Signed)
 Called lab to add TSH and cortisol to saved tube sent earlier

## 2023-12-25 NOTE — ED Notes (Signed)
 Patient ambulated to bathroom with assistance.

## 2023-12-25 NOTE — Consult Note (Signed)
 Referring Provider: Dr. Alanda Slim Primary Care Physician:  Emilio Aspen, MD Primary Gastroenterologist:  Gentry Fitz  Reason for Consultation:  Nausea  HPI: Kristen [name] is a 89 y.o. female with multiple medical problems including chronic pain on Dilaudid, PE on Eliquis, and lung cancer seen for a consultation for chronic nausea without vomiting. Denies abdominal pain, diarrhea or rectal bleeding. Hungry and wants to eat. Reports losing 30-40 pounds in the past 3-4 months that she attributes to not liking food at her assisted living. Seen by our NP, C. Freeman on 2/25 and plan was for colonoscopy by Dr. Marca Ancona due to hypermetabolic area in sigmoid colon on PET scan. Severe esophageal dysmotility on barium swallow in 2023.  Past Medical History:  Diagnosis Date   Anemia    Arthritis    B12 deficiency    Chronic kidney disease    Chronic pain disorder    trigeminal neuralgia   Depression    Dyslipidemia    Hypertension    PONV (postoperative nausea and vomiting)    Seizure disorder (HCC)    decades since last seizure   Tremor    Trigeminal nerve disorder     Past Surgical History:  Procedure Laterality Date   ABDOMINAL HYSTERECTOMY     BLADDER REPAIR     tack   CATARACT EXTRACTION     CHOLECYSTECTOMY     gamma knife  2004   NOSE SURGERY     for fracture   SHOULDER ARTHROSCOPY DISTAL CLAVICLE EXCISION AND OPEN ROTATOR CUFF REPAIR Left    rotary cuff repair   TONSILLECTOMY     TOTAL HIP ARTHROPLASTY Right 10/30/2023   Procedure: TOTAL HIP ARTHROPLASTY ANTERIOR APPROACH;  Surgeon: Durene Romans, MD;  Location: [facility] OR;  Service: Orthopedics;  Laterality: Right;   TOTAL KNEE ARTHROPLASTY Bilateral    UMBILICAL HERNIA REPAIR      Prior to Admission medications   Medication Sig Start Date End Date Taking? Authorizing Provider  acetaminophen (TYLENOL) 650 MG CR tablet Take 650 mg by mouth every 8 (eight) hours as needed for pain.   Yes [provider]  cyanocobalamin  (,VITAMIN B-12,) 1000 MCG/ML injection Inject 100 mcg into the muscle every 30 (thirty) days. 11/02/19  Yes [provider]  diclofenac Sodium (VOLTAREN) 1 % GEL Apply 4 g topically 4 (four) times daily as needed (For pain).   Yes [provider]  docusate sodium (COLACE) 50 MG capsule Take 50 mg by mouth daily as needed for mild constipation.   Yes [provider]  ELIQUIS 5 MG TABS tablet Take 5 mg by mouth 2 (two) times daily. 09/28/22  Yes [provider]  esomeprazole (NEXIUM) 40 MG capsule Take 40 mg by mouth daily.   Yes [provider]  HYDROmorphone (DILAUDID) 2 MG tablet Take 1 tablet (2 mg total) by mouth in the morning and at bedtime. Patient taking differently: Take 2 mg by mouth 3 (three) times daily as needed for moderate pain (pain score 4-6) or severe pain (pain score 7-10). 11/04/23  Yes Barnetta Chapel, MD  hydrOXYzine (ATARAX) 25 MG tablet Take 25 mg by mouth at bedtime.   Yes [provider]  hydrOXYzine (VISTARIL) 25 MG capsule Take 25 mg by mouth at bedtime.   Yes [provider]  ipratropium (ATROVENT) 0.06 % nasal spray Place 1 spray into both nostrils daily as needed for rhinitis.   Yes [provider]  losartan (COZAAR) 25 MG tablet Take 25 mg by  mouth daily.   Yes [provider]  ondansetron (ZOFRAN) 8 MG tablet Take 16 mg by mouth 3 (three) times daily as needed for nausea or vomiting.   Yes [provider]  prochlorperazine (COMPAZINE) 5 MG tablet Take 5 mg by mouth every 6 (six) hours as needed for vomiting or nausea. 12/22/23  Yes [provider]  Syringe/Needle, Disp, (SYRINGE 3CC/25GX1-1/2") 25G X 1-1/2" 3 ML MISC 1 each by Does not apply route every 30 (thirty) days. For B12 injection    [provider]    Scheduled Meds:  amLODipine  5 mg Oral Daily   apixaban  5 mg Oral BID   hydrOXYzine  25 mg Oral QHS   pantoprazole  40 mg Oral Daily   Continuous  Infusions: PRN Meds:.acetaminophen **OR** acetaminophen, albuterol, HYDROmorphone, ondansetron **OR** ondansetron (ZOFRAN) IV, traZODone  Allergies as of 12/24/2023 - Review Complete 12/24/2023  Allergen Reaction Noted   Penicillins Hives and Rash 07/18/2013   Tetracyclines & related Anaphylaxis 07/18/2013   Sulfa antibiotics Hives and Other (See Comments) 04/23/2015    History reviewed. No pertinent family history.  Social History   Socioeconomic History   Marital status: Widowed    Spouse name: Not on file   Number of children: 3   Years of education: Not on file   Highest education level: Not on file  Occupational History   Occupation: retired  Tobacco Use   Smoking status: Never    Passive exposure: Never   Smokeless tobacco: Never  Substance and Sexual Activity   Alcohol use: Not Currently   Drug use: Never   Sexual activity: Not on file  Other Topics Concern   Not on file  Social History Narrative   12/15/21 lives at [place]   Social Drivers of Health   Financial Resource Strain: Low Risk  (06/19/2019)   Received from [facility], [facility]   Overall Financial Resource Strain (CARDIA)    Difficulty of Paying Living Expenses: Not hard at all  Food Insecurity: No Food Insecurity (12/25/2023)   Hunger Vital Sign    Worried About Running Out of Food in the Last Year: Never true    Ran Out of Food in the Last Year: Never true  Transportation Needs: No Transportation Needs (12/25/2023)   PRAPARE - Administrator, Civil Service (Medical): No    Lack of Transportation (Non-Medical): No  Physical Activity: Not on file  Stress: No Stress Concern Present (06/19/2019)   Received from [facility], [facility]   [employer] [facility] of Occupational Health - Occupational Stress Questionnaire    Feeling of Stress : Only a little  Social Connections: Socially Isolated (12/25/2023)   Social Connection and Isolation Panel [NHANES]    Frequency of  Communication with Friends and Family: More than three times a week    Frequency of Social Gatherings with Friends and Family: More than three times a week    Attends Religious Services: Never    Database administrator or Organizations: No    Attends Banker Meetings: Never    Marital Status: Widowed  Intimate Partner Violence: Not At Risk (12/25/2023)   Humiliation, Afraid, Rape, and Kick questionnaire    Fear of Current or Ex-Partner: No    Emotionally Abused: No    Physically Abused: No    Sexually Abused: No    Review of Systems: All negative except as stated above in HPI.  Physical Exam: Vital  signs: Vitals:   12/25/23 1018 12/25/23 1047  BP: (!) 136/49 (!) 152/55  Pulse: 64 61  Resp: 16 20  Temp: 97.6 F (36.4 C) (!) 97.5 F (36.4 C)  SpO2: 100% 100%     General:  elderly, lethargic, thin, no acute distress, pleasant  Head: normocephalic, atraumatic Eyes: anicteric sclera ENT: oropharynx clear Neck: supple, nontender Lungs:  Clear throughout to auscultation.   No wheezes, crackles, or rhonchi. No acute distress. Heart:  Regular rate and rhythm; no murmurs, clicks, rubs,  or gallops. Abdomen: soft, nontender, nondistended, +BS  Rectal:  Deferred Ext: no edema  GI:  Lab Results: Recent Labs    12/24/23 1032 12/25/23 0511  WBC 7.4 5.8  HGB 11.1* 10.8*  HCT 32.2* 32.7*  PLT 212 197   BMET Recent Labs    12/24/23 1032 12/24/23 1944 12/25/23 0511  NA 128* 129* 130*  K 4.7 4.1 3.8  CL 96* 100 98  CO2 24 22 25   GLUCOSE 101* 104* 94  BUN 23 18 16   CREATININE 1.00 0.92 0.96  CALCIUM 8.9 8.2* 8.8*   LFT Recent Labs    12/24/23 1032  PROT 6.4*  ALBUMIN 3.7  AST 19  ALT 13  ALKPHOS 71  BILITOT 0.8   PT/INR No results for input(s): "LABPROT", "INR" in the last 72 hours.   Studies/Results: No results found.  Impression/Plan: Chronic nausea likely multifactorial in the setting of lung cancer and chronic pain. Nausea has been  present since last summer and she denies worsening of it. Denies vomiting. Doubt gastric outlet obstruction and do not feel an EGD is needed. Hypermetabolic area in colon can be evaluated as outpt by Dr. Marca Ancona as previously planned. On regular diet. Supportive care. Will f/u.    LOS: 0 days   [name]  12/25/2023, 12:25 PM  Questions please call [phone]

## 2023-12-25 NOTE — Progress Notes (Signed)
 PROGRESS NOTE  Kristen [name] ZOX:[id number] DOB: [birth year]   PCP: Emilio Aspen, MD  Patient is from: ILF  DOA: 12/24/2023 LOS: 0  Chief complaints Chief Complaint  Patient presents with   Nausea     Brief Narrative / Interim history: 89 year old F with PMH of COPD, lung nodule concerning for adenocarcinoma, chronic pain on p.o. Dilaudid, PE on Eliquis, hyponatremia, HTN, HLD, anxiety and depression presenting with chronic nausea with vomiting, poor p.o. intake and unintentional weight loss.  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  She is also followed by GI and there was a discussion about gastric emptying test and/or colonoscopy.  In ED, vital stable. N 128 (about baseline).a Hgb 11.1.  Troponin negative.  [facility], Dr. Dulce Sellar consulted and [facility] hospitalist service asked for admission.  Subjective: Seen and examined earlier this morning.  No major events overnight of this morning.  She has no specific complaints other than nausea.  She reports about 30 to 40 pounds weight loss in the last 12 months.  Per chart review, her weight went down from 62.6 kg to 56 kg in the last 12 months and has been pretty stable over the last 4 months.   Objective: Vitals:   12/25/23 0400 12/25/23 0642 12/25/23 0706 12/25/23 0800  BP: (!) 171/68  (!) 153/57 (!) 173/52  Pulse: 66 61 (!) 56 63  Resp: 16  16 16   Temp:  97.7 F (36.5 C)    TempSrc:  Oral    SpO2: 97% 100% 97% 98%  Weight:        Examination:  GENERAL: No apparent distress.  Nontoxic. HEENT: MMM.  Vision and hearing grossly intact.  NECK: Supple.  No apparent JVD.  RESP:  No IWOB.  Fair aeration bilaterally. CVS:  RRR. Heart sounds normal.  ABD/GI/GU: BS+. Abd soft, NTND.  MSK/EXT:  Moves extremities. No apparent deformity. No edema.  SKIN: no apparent skin lesion or wound NEURO: Awake, alert  and oriented x 4.  No apparent focal neuro deficit. PSYCH: Calm. Normal affect.   Procedures:  None  Microbiology summarized: None  Assessment and plan: Chronic hyponatremia: Mild and asymptomatic.  Baseline Na about 130.  She is on losartan which could contribute.  There is also concerned about pulmonary adenocarcinoma as noted on PET scan about a month ago.  She also have chronic nausea and poor p.o. intake which might have contributed.  Stable -Check TSH, cortisol and urine chemistry -Hold losartan.  Start low-dose amlodipine for blood pressure   Chronic nausea: Ongoing for about 6 months.  Potential etiologies include opiate, underlying malignancy (per recent PET scan), possible gastritis or motility issue.  Abdominal exam is benign.  She is not tender.   Since she was seen by [facility] GI outpatient and there was a discussion about gastric emptying study and/or colonoscopy. Dr. Dulce Sellar with [facility] consulted.   -Continue p.o. Protonix -Follow GI recommendations  -Antiemetics as needed -Continue regular diet for now  Essential hypertension: BP slightly elevated.  On low-dose losartan at home. -Hold losartan given hyponatremia -Low-dose amlodipine  RUL nodule:  Had PET scan on 1/31 that showed 2.9 x 2.5 cm RUL nodule and 3.0 x 0.9 cm bandlike lingual and density concerning for adenocarcinoma.  Daughter states that she had a discussion with pulmonology, Dr. Delton Coombes earlier last month.  At that time, the plan was to monitor.  Focal hypermetabolic activity in sigmoid colon: Noted on recent PET/CT.  Correlation with colonoscopy is recommended -Defer to GI  Chronic pain: Daughter reports atypical facial pain and headache for which she has been put on p.o. Dilaudid by pain clinic.  Again this could contribute to patient's nausea.  Discussed with patient's daughter. -Continue home p.o. Dilaudid  History of PE: continue Eliquis -Continue home Eliquis  Anxiety and depression: has been weaned  off Effexor  -continue nightly hydroxyzine  Right renal lesion: Noted on recent PET scan.  Referred to urology and felt to be simple cyst.  Body mass index is 22.58 kg/m.            DVT prophylaxis:  SCDs Start: 12/24/23 1327 apixaban (ELIQUIS) tablet 5 mg  Code Status: DNR/DNI Family Communication: Updated patient's daughter over the phone Level of care: Med-Surg Status is: Observation The patient remains OBS appropriate and will d/c before 2 midnights.   Final disposition: Likely back to ILF Consultants:  Gastroenterology  55 minutes with more than 50% spent in reviewing records, counseling patient/family and coordinating care.   Sch Meds:  Scheduled Meds:  apixaban  5 mg Oral BID   hydrOXYzine  25 mg Oral QHS   pantoprazole  40 mg Oral Daily   Continuous Infusions: PRN Meds:.acetaminophen **OR** acetaminophen, albuterol, HYDROmorphone, ondansetron **OR** ondansetron (ZOFRAN) IV, traZODone  Antimicrobials: Anti-infectives (From admission, onward)    None        I have personally reviewed the following labs and images: CBC: Recent Labs  Lab 12/24/23 1032 12/25/23 0511  WBC 7.4 5.8  NEUTROABS 4.9  --   HGB 11.1* 10.8*  HCT 32.2* 32.7*  MCV 93.9 96.5  PLT 212 197   BMP &GFR Recent Labs  Lab 12/24/23 1032 12/24/23 1944 12/25/23 0511  NA 128* 129* 130*  K 4.7 4.1 3.8  CL 96* 100 98  CO2 24 22 25   GLUCOSE 101* 104* 94  BUN 23 18 16   CREATININE 1.00 0.92 0.96  CALCIUM 8.9 8.2* 8.8*   Estimated Creatinine Clearance: 32 mL/min (by C-G formula based on SCr of 0.96 mg/dL). Liver & Pancreas: Recent Labs  Lab 12/24/23 1032  AST 19  ALT 13  ALKPHOS 71  BILITOT 0.8  PROT 6.4*  ALBUMIN 3.7   Recent Labs  Lab 12/24/23 1032  LIPASE 36   No results for input(s): "AMMONIA" in the last 168 hours. Diabetic: No results for input(s): "HGBA1C" in the last 72 hours. No results for input(s): "GLUCAP" in the last 168 hours. Cardiac Enzymes: No  results for input(s): "CKTOTAL", "CKMB", "CKMBINDEX", "TROPONINI" in the last 168 hours. No results for input(s): "PROBNP" in the last 8760 hours. Coagulation Profile: No results for input(s): "INR", "PROTIME" in the last 168 hours. Thyroid Function Tests: Recent Labs    12/25/23 0611  TSH 1.031   Lipid Profile: No results for input(s): "CHOL", "HDL", "LDLCALC", "TRIG", "CHOLHDL", "LDLDIRECT" in the last 72 hours. Anemia Panel: No results for input(s): "VITAMINB12", "FOLATE", "FERRITIN", "TIBC", "IRON", "RETICCTPCT" in the last 72 hours. Urine analysis:    Component Value Date/Time   COLORURINE YELLOW 11/03/2023 1530   APPEARANCEUR CLEAR 11/03/2023 1530   LABSPEC 1.011 11/03/2023 1530   PHURINE 7.0 11/03/2023 1530   GLUCOSEU NEGATIVE 11/03/2023 1530   HGBUR NEGATIVE 11/03/2023 1530   BILIRUBINUR NEGATIVE 11/03/2023 1530   KETONESUR NEGATIVE 11/03/2023 1530   PROTEINUR NEGATIVE 11/03/2023 1530   NITRITE NEGATIVE 11/03/2023 1530   LEUKOCYTESUR NEGATIVE 11/03/2023 1530   Sepsis  Labs: Invalid input(s): "PROCALCITONIN", "LACTICIDVEN"  Microbiology: No results found for this or any previous visit (from the past 240 hours).  Radiology Studies: No results found.    Echo Allsbrook T. Joye Wesenberg [facility]  If 7PM-7AM, please contact night-coverage [email or web address] 12/25/2023, 10:02 AM

## 2023-12-25 NOTE — Plan of Care (Signed)
  Problem: Education: Goal: Knowledge of General Education information will improve Description: Including pain rating scale, medication(s)/side effects and non-pharmacologic comfort measures Outcome: Progressing   Problem: Health Behavior/Discharge Planning: Goal: Ability to manage health-related needs will improve Outcome: Progressing   Problem: Clinical Measurements: Goal: Ability to maintain clinical measurements within normal limits will improve Outcome: Progressing Goal: Will remain free from infection Outcome: Progressing   Problem: Activity: Goal: Risk for activity intolerance will decrease Outcome: Progressing   Problem: Nutrition: Goal: Adequate nutrition will be maintained Outcome: Progressing   Problem: Pain Managment: Goal: General experience of comfort will improve and/or be controlled Outcome: Progressing   Problem: Safety: Goal: Ability to remain free from injury will improve Outcome: Progressing   Problem: Skin Integrity: Goal: Risk for impaired skin integrity will decrease Outcome: Progressing

## 2023-12-25 NOTE — ED Notes (Signed)
 Updated daughter Darl Pikes about patient

## 2023-12-25 NOTE — ED Notes (Signed)
 Patient ambulated to bathroom.

## 2023-12-26 DIAGNOSIS — E538 Deficiency of other specified B group vitamins: Secondary | ICD-10-CM | POA: Diagnosis present

## 2023-12-26 DIAGNOSIS — B9620 Unspecified Escherichia coli [E. coli] as the cause of diseases classified elsewhere: Secondary | ICD-10-CM | POA: Diagnosis not present

## 2023-12-26 DIAGNOSIS — Z66 Do not resuscitate: Secondary | ICD-10-CM | POA: Diagnosis present

## 2023-12-26 DIAGNOSIS — R948 Abnormal results of function studies of other organs and systems: Secondary | ICD-10-CM | POA: Diagnosis not present

## 2023-12-26 DIAGNOSIS — E222 Syndrome of inappropriate secretion of antidiuretic hormone: Secondary | ICD-10-CM | POA: Diagnosis present

## 2023-12-26 DIAGNOSIS — R110 Nausea: Secondary | ICD-10-CM | POA: Diagnosis present

## 2023-12-26 DIAGNOSIS — K219 Gastro-esophageal reflux disease without esophagitis: Secondary | ICD-10-CM | POA: Diagnosis present

## 2023-12-26 DIAGNOSIS — F32A Depression, unspecified: Secondary | ICD-10-CM | POA: Diagnosis present

## 2023-12-26 DIAGNOSIS — N281 Cyst of kidney, acquired: Secondary | ICD-10-CM | POA: Diagnosis not present

## 2023-12-26 DIAGNOSIS — Z96653 Presence of artificial knee joint, bilateral: Secondary | ICD-10-CM | POA: Diagnosis present

## 2023-12-26 DIAGNOSIS — G894 Chronic pain syndrome: Secondary | ICD-10-CM | POA: Diagnosis present

## 2023-12-26 DIAGNOSIS — D630 Anemia in neoplastic disease: Secondary | ICD-10-CM | POA: Diagnosis present

## 2023-12-26 DIAGNOSIS — I10 Essential (primary) hypertension: Secondary | ICD-10-CM | POA: Diagnosis not present

## 2023-12-26 DIAGNOSIS — E861 Hypovolemia: Secondary | ICD-10-CM | POA: Diagnosis present

## 2023-12-26 DIAGNOSIS — Z8719 Personal history of other diseases of the digestive system: Secondary | ICD-10-CM | POA: Diagnosis not present

## 2023-12-26 DIAGNOSIS — C3490 Malignant neoplasm of unspecified part of unspecified bronchus or lung: Secondary | ICD-10-CM | POA: Diagnosis not present

## 2023-12-26 DIAGNOSIS — E785 Hyperlipidemia, unspecified: Secondary | ICD-10-CM | POA: Diagnosis present

## 2023-12-26 DIAGNOSIS — N189 Chronic kidney disease, unspecified: Secondary | ICD-10-CM | POA: Diagnosis present

## 2023-12-26 DIAGNOSIS — Z86711 Personal history of pulmonary embolism: Secondary | ICD-10-CM | POA: Diagnosis not present

## 2023-12-26 DIAGNOSIS — N390 Urinary tract infection, site not specified: Secondary | ICD-10-CM | POA: Diagnosis not present

## 2023-12-26 DIAGNOSIS — K3189 Other diseases of stomach and duodenum: Secondary | ICD-10-CM | POA: Diagnosis not present

## 2023-12-26 DIAGNOSIS — Z96641 Presence of right artificial hip joint: Secondary | ICD-10-CM | POA: Diagnosis present

## 2023-12-26 DIAGNOSIS — E871 Hypo-osmolality and hyponatremia: Secondary | ICD-10-CM | POA: Diagnosis not present

## 2023-12-26 DIAGNOSIS — I129 Hypertensive chronic kidney disease with stage 1 through stage 4 chronic kidney disease, or unspecified chronic kidney disease: Secondary | ICD-10-CM | POA: Diagnosis present

## 2023-12-26 DIAGNOSIS — K317 Polyp of stomach and duodenum: Secondary | ICD-10-CM | POA: Diagnosis not present

## 2023-12-26 DIAGNOSIS — R1115 Cyclical vomiting syndrome unrelated to migraine: Secondary | ICD-10-CM | POA: Diagnosis present

## 2023-12-26 DIAGNOSIS — R634 Abnormal weight loss: Secondary | ICD-10-CM | POA: Diagnosis present

## 2023-12-26 DIAGNOSIS — Z9049 Acquired absence of other specified parts of digestive tract: Secondary | ICD-10-CM | POA: Diagnosis not present

## 2023-12-26 DIAGNOSIS — C3411 Malignant neoplasm of upper lobe, right bronchus or lung: Secondary | ICD-10-CM | POA: Diagnosis present

## 2023-12-26 DIAGNOSIS — Z7901 Long term (current) use of anticoagulants: Secondary | ICD-10-CM | POA: Diagnosis not present

## 2023-12-26 DIAGNOSIS — F419 Anxiety disorder, unspecified: Secondary | ICD-10-CM | POA: Diagnosis present

## 2023-12-26 DIAGNOSIS — J449 Chronic obstructive pulmonary disease, unspecified: Secondary | ICD-10-CM | POA: Diagnosis present

## 2023-12-26 DIAGNOSIS — K2970 Gastritis, unspecified, without bleeding: Secondary | ICD-10-CM | POA: Diagnosis not present

## 2023-12-26 LAB — CBC
HCT: 33.4 % — ABNORMAL LOW (ref 36.0–46.0)
Hemoglobin: 11.4 g/dL — ABNORMAL LOW (ref 12.0–15.0)
MCH: 31.9 pg (ref 26.0–34.0)
MCHC: 34.1 g/dL (ref 30.0–36.0)
MCV: 93.6 fL (ref 80.0–100.0)
Platelets: 223 10*3/uL (ref 150–400)
RBC: 3.57 MIL/uL — ABNORMAL LOW (ref 3.87–5.11)
RDW: 12.8 % (ref 11.5–15.5)
WBC: 6.5 10*3/uL (ref 4.0–10.5)
nRBC: 0 % (ref 0.0–0.2)

## 2023-12-26 LAB — RENAL FUNCTION PANEL
Albumin: 3.6 g/dL (ref 3.5–5.0)
Anion gap: 10 (ref 5–15)
BUN: 14 mg/dL (ref 8–23)
CO2: 23 mmol/L (ref 22–32)
Calcium: 8.9 mg/dL (ref 8.9–10.3)
Chloride: 98 mmol/L (ref 98–111)
Creatinine, Ser: 0.8 mg/dL (ref 0.44–1.00)
GFR, Estimated: >60 mL/min (ref >60)
Glucose, Bld: 92 mg/dL (ref 70–99)
Phosphorus: 3.7 mg/dL (ref 2.5–4.6)
Potassium: 4.5 mmol/L (ref 3.5–5.1)
Sodium: 131 mmol/L — ABNORMAL LOW (ref 135–145)

## 2023-12-26 LAB — MAGNESIUM: Magnesium: 1.5 mg/dL — ABNORMAL LOW (ref 1.7–2.4)

## 2023-12-26 MED ORDER — MAGNESIUM SULFATE 2 GM/50ML IV SOLN
2.0000 g | Freq: Once | INTRAVENOUS | Status: AC
  Administered 2023-12-26: 2 g via INTRAVENOUS
  Filled 2023-12-26: qty 50

## 2023-12-26 MED ORDER — SODIUM CHLORIDE 0.9 % IV SOLN: INTRAVENOUS | Status: DC

## 2023-12-26 MED ORDER — AMLODIPINE BESYLATE 10 MG PO TABS
10.0000 mg | ORAL_TABLET | Freq: Every day | ORAL | Status: DC
  Administered 2023-12-26 – 2023-12-30 (×5): 10 mg via ORAL
  Filled 2023-12-26 (×5): qty 1

## 2023-12-26 NOTE — Progress Notes (Signed)
 Mobility Specialist - Progress Note   12/26/23 0920  Mobility  Activity Ambulated with assistance in hallway  Level of Assistance Standby assist, set-up cues, supervision of patient - no hands on  Assistive Device Front wheel walker  Distance Ambulated (ft) 230 ft  Activity Response Tolerated well  Mobility Referral Yes  Mobility visit 1 Mobility  Mobility Specialist Start Time (ACUTE ONLY) 0911  Mobility Specialist Stop Time (ACUTE ONLY) 0920  Mobility Specialist Time Calculation (min) (ACUTE ONLY) 9 min   Pt received EOB and agreeable to mobility. C/o weak knees but still eager to ambulate. Pt to EOB after session with all needs met.    [facility]

## 2023-12-26 NOTE — Progress Notes (Signed)
 PROGRESS NOTE  Kristen [name] WGN:[id number] DOB: Jun 19, 1933   PCP: Emilio Aspen, MD  Patient is from: ILF  DOA: 12/24/2023 LOS: 0  Chief complaints Chief Complaint  Patient presents with   Nausea     Brief Narrative / Interim history: 89 year old F with PMH of COPD, lung nodule concerning for adenocarcinoma, chronic pain on p.o. Dilaudid, PE on Eliquis, hyponatremia, HTN, HLD, anxiety and depression presenting with chronic nausea with vomiting, poor p.o. intake and unintentional weight loss.  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  She is also followed by GI and there was a discussion about gastric emptying test and/or colonoscopy.  In ED, vital stable. N 128 (about baseline).a Hgb 11.1.  Troponin negative.  [facility], Dr. Dulce Sellar consulted and [facility] hospitalist service asked for admission.  Plan for EGD on 3/30.  Subjective: Seen and examined earlier this morning.  No major events overnight of this morning.  Continues to endorse nausea which is worse after eating.  Denies emesis or abdominal pain.  Denies diarrhea.   Objective: Vitals:   12/26/23 0006 12/26/23 0510 12/26/23 1039 12/26/23 1322  BP: (!) 145/94 (!) 164/59 (!) 160/50 134/63  Pulse: (!) 54 (!) 58 (!) 53 67  Resp: 17 17 20 20   Temp: (!) 97.4 F (36.3 C) (!) 97.5 F (36.4 C) (!) 97.5 F (36.4 C) (!) 97.5 F (36.4 C)  TempSrc: Oral Oral Oral Oral  SpO2: 99% 100% 100% 100%  Weight:      Height:        Examination:  GENERAL: No apparent distress.  Nontoxic. HEENT: MMM.  Vision and hearing grossly intact.  NECK: Supple.  No apparent JVD.  RESP:  No IWOB.  Fair aeration bilaterally. CVS:  RRR. Heart sounds normal.  ABD/GI/GU: BS+. Abd soft, NTND.  MSK/EXT:  Moves extremities. No apparent deformity. No edema.  SKIN: no apparent skin lesion or wound NEURO: Awake, alert and oriented  x 4.  No apparent focal neuro deficit. PSYCH: Calm. Normal affect.   Procedures:  None  Microbiology summarized: None  Assessment and plan: Chronic hyponatremia: Mild and asymptomatic.  Baseline Na about 130.  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  Losartan, chronic nausea with poor p.o. intake could also contribute.  TSH and a.m. cortisol within normal. -Continue holding losartan -Continue monitoring.   Chronic nausea: Ongoing for about 6 months.  Potential etiologies include opiate, underlying malignancy (per recent PET scan), possible gastritis or motility issue.  Abdominal exam is benign.  She is not tender.  -Plan for EGD on 3/3.  N.p.o. after midnight -Continue p.o. Protonix -Antiemetics as needed  Essential hypertension: BP slightly elevated.  On low-dose losartan at home. -Hold losartan given hyponatremia -Increase amlodipine to 10 mg daily.  RUL nodule:  Had PET scan on 1/31 that showed 2.9 x 2.5 cm RUL nodule and 3.0 x 0.9 cm bandlike lingual and density concerning for adenocarcinoma.  Daughter states that she had a discussion with pulmonology, Dr. Delton Coombes earlier last month.  At that time, the plan was to monitor.  Focal hypermetabolic activity in sigmoid colon: Noted on recent PET/CT.  Correlation with colonoscopy is recommended -Defer to GI  Chronic pain: Daughter reports atypical facial pain and headache for which she has been put on p.o. Dilaudid by pain clinic.  Again this could contribute to patient's nausea.  Discussed with patient's daughter. -Continue home p.o. Dilaudid  History of PE: continue Eliquis -Continue home Eliquis  Anxiety and depression: has been weaned off Effexor  -continue nightly hydroxyzine  Hypomagnesemia -Replenish and recheck  Right renal lesion: Noted on recent PET scan.  Referred to urology and felt to be simple cyst.  Body mass index is 20.91 kg/m.            DVT prophylaxis:  SCDs Start:  12/24/23 1327 apixaban (ELIQUIS) tablet 5 mg  Code Status: DNR/DNI Family Communication: Updated patient's daughter over the phone on 3/1.  None at bedside today Level of care: Med-Surg Status is: Observation The patient remains OBS appropriate and will d/c before 2 midnights.   Final disposition: Likely back to ILF Consultants:  Gastroenterology  35 minutes with more than 50% spent in reviewing records, counseling patient/family and coordinating care.   Sch Meds:  Scheduled Meds:  amLODipine  10 mg Oral Daily   apixaban  5 mg Oral BID   hydrOXYzine  25 mg Oral QHS   pantoprazole  40 mg Oral Daily   Continuous Infusions: PRN Meds:.acetaminophen **OR** acetaminophen, albuterol, HYDROmorphone, ondansetron **OR** ondansetron (ZOFRAN) IV, traZODone  Antimicrobials: Anti-infectives (From admission, onward)    None        I have personally reviewed the following labs and images: CBC: Recent Labs  Lab 12/24/23 1032 12/25/23 0511 12/26/23 0756  WBC 7.4 5.8 6.5  NEUTROABS 4.9  --   --   HGB 11.1* 10.8* 11.4*  HCT 32.2* 32.7* 33.4*  MCV 93.9 96.5 93.6  PLT 212 197 223   BMP &GFR Recent Labs  Lab 12/24/23 1032 12/24/23 1944 12/25/23 0511 12/26/23 0756  NA 128* 129* 130* 131*  K 4.7 4.1 3.8 4.5  CL 96* 100 98 98  CO2 24 22 25 23   GLUCOSE 101* 104* 94 92  BUN 23 18 16 14   CREATININE 1.00 0.92 0.96 0.80  CALCIUM 8.9 8.2* 8.8* 8.9  MG  --   --   --  1.5*  PHOS  --   --   --  3.7   Estimated Creatinine Clearance: 38.4 mL/min (by C-G formula based on SCr of 0.8 mg/dL). Liver & Pancreas: Recent Labs  Lab 12/24/23 1032 12/26/23 0756  AST 19  --   ALT 13  --   ALKPHOS 71  --   BILITOT 0.8  --   PROT 6.4*  --   ALBUMIN 3.7 3.6   Recent Labs  Lab 12/24/23 1032  LIPASE 36   No results for input(s): "AMMONIA" in the last 168 hours. Diabetic: No results for input(s): "HGBA1C" in the last 72 hours. No results for input(s): "GLUCAP" in the last 168  hours. Cardiac Enzymes: No results for input(s): "CKTOTAL", "CKMB", "CKMBINDEX", "TROPONINI" in the last 168 hours. No results for input(s): "PROBNP" in the last 8760 hours. Coagulation Profile: No results for input(s): "INR", "PROTIME" in the last 168 hours. Thyroid Function Tests: Recent Labs    12/25/23 0611  TSH 1.031   Lipid Profile: No results for input(s): "CHOL", "HDL", "LDLCALC", "TRIG", "CHOLHDL", "LDLDIRECT" in the last 72 hours. Anemia Panel: No results for input(s): "VITAMINB12", "FOLATE", "FERRITIN", "TIBC", "IRON", "RETICCTPCT" in the last 72 hours. Urine analysis:    Component Value Date/Time   COLORURINE YELLOW 11/03/2023 1530   APPEARANCEUR CLEAR 11/03/2023 1530   LABSPEC 1.011 11/03/2023 1530   PHURINE 7.0 11/03/2023 1530   GLUCOSEU NEGATIVE 11/03/2023 1530   HGBUR NEGATIVE 11/03/2023 1530  BILIRUBINUR NEGATIVE 11/03/2023 1530   KETONESUR NEGATIVE 11/03/2023 1530   PROTEINUR NEGATIVE 11/03/2023 1530   NITRITE NEGATIVE 11/03/2023 1530   LEUKOCYTESUR NEGATIVE 11/03/2023 1530   Sepsis Labs: Invalid input(s): "PROCALCITONIN", "LACTICIDVEN"  Microbiology: No results found for this or any previous visit (from the past 240 hours).  Radiology Studies: No results found.    Mario Coronado T. Stephanie Mcglone [facility]  If 7PM-7AM, please contact night-coverage [email or web address] 12/26/2023, 2:49 PM

## 2023-12-26 NOTE — TOC Initial Note (Signed)
 Transition of Care [facility]) - Initial/Assessment Note    Patient Details  Name: Kristen [name] MRN: [id number] Date of Birth: [birth year]  [facility] CM/SW Contact:    Adrian Prows, RN Phone Number: 12/26/2023, 10:43 AM  Clinical Narrative:                 Spoke w/ pt in room; pt says she lives at [facility]; she plans to return at d/c; pt identified POC dtr Tamela Oddi [phone]); her dtr will provide transportation; pt verified PCP/Insurance; she denies SDOH; pt says she has a walker; she does not have HH services or home oxygen; TOC will follow.  Expected Discharge Plan: Home/Self Care Barriers to Discharge: Continued Medical Work up   Patient Goals and CMS Choice Patient states their goals for this hospitalization and ongoing recovery are:: return to [facility] [place] [email or web address] Compare Post Acute Care list provided to:: Patient        Expected Discharge Plan and Services   Discharge Planning Services: CM Consult   Living arrangements for the past 2 months: Independent Living Facility                                      Prior Living Arrangements/Services Living arrangements for the past 2 months: Independent Living Facility Lives with:: Self Patient language and need for interpreter reviewed:: Yes Do you feel safe going back to the place where you live?: Yes      Need for Family Participation in Patient Care: Yes (Comment) Care giver support system in place?: Yes (comment) Current home services: DME (walker) Criminal Activity/Legal Involvement Pertinent to Current Situation/Hospitalization: No - Comment as needed  Activities of Daily Living   ADL Screening (condition at time of admission) Independently performs ADLs?: Yes (appropriate for developmental age) Is the patient deaf or have difficulty hearing?: No Does the patient have difficulty seeing, even when wearing glasses/contacts?: No Does the patient have difficulty  concentrating, remembering, or making decisions?: No  Permission Sought/Granted Permission sought to share information with : Case Manager Permission granted to share information with : Yes, Verbal Permission Granted  Share Information with NAME: Case Manager     Permission granted to share info w Relationship: Tamela Oddi (dtr) [phone]     Emotional Assessment Appearance:: Appears stated age Attitude/Demeanor/Rapport: Gracious Affect (typically observed): Accepting Orientation: : Oriented to Self, Oriented to Place, Oriented to  Time, Oriented to Situation Alcohol / Substance Use: Not Applicable Psych Involvement: No (comment)  Admission diagnosis:  Hyponatremia [E87.1] Nausea without vomiting [R11.0] Patient Active Problem List   Diagnosis Date Noted   Hyponatremia 12/24/2023   S/P total right hip arthroplasty 10/30/2023   Closed right hip fracture, initial encounter (HCC) 10/29/2023   History of pulmonary embolism 10/29/2023   Renal mass 10/29/2023   Leukocytosis 10/29/2023   DOE (dyspnea on exertion) 05/20/2022   Chronic rhinitis 05/20/2022   Dysphagia 05/20/2022   Pulmonary embolism (HCC) 11/26/2021   Acute pulmonary embolism without acute cor pulmonale (HCC) 11/23/2021   Pulmonary mass 11/23/2021   Chronic pain disorder 11/23/2021   Stage 3b chronic kidney disease (CKD) (HCC) 11/23/2021   DNR (do not resuscitate) 11/23/2021   Tricuspid regurgitation 10/07/2021   Pulmonary hypertension, unspecified (HCC) 10/07/2021   Hyperlipidemia 10/07/2021   Essential hypertension 10/07/2021   Atypical chest pain 10/07/2021   PCP:  Emilio Aspen, MD Pharmacy:   [facility] [place], [place] - [address] [place] [address] Phone: [phone] Fax: [phone]  [facility] [address], [address] [place] [address] Phone: [phone] Fax: [phone]     Social  Drivers of Health (SDOH) Social History: SDOH Screenings   Food Insecurity: No Food Insecurity (12/26/2023)  Housing: Low Risk  (12/26/2023)  Transportation Needs: No Transportation Needs (12/26/2023)  Utilities: Not At Risk (12/26/2023)  Financial Resource Strain: Low Risk  (06/19/2019)   Received from [facility], [facility]  Social Connections: Socially Isolated (12/25/2023)  Stress: No Stress Concern Present (06/19/2019)   Received from [facility], [facility]  Tobacco Use: Low Risk  (12/24/2023)  Health Literacy: Medium Risk (02/01/2021)   Received from [facility], [facility]   SDOH Interventions: Food Insecurity Interventions: Intervention Not Indicated, Inpatient TOC Housing Interventions: Intervention Not Indicated, Inpatient TOC Transportation Interventions: Intervention Not Indicated, Inpatient TOC Utilities Interventions: Intervention Not Indicated, Inpatient TOC   Readmission Risk Interventions     No data to display

## 2023-12-26 NOTE — Progress Notes (Signed)
 [facility] Gastroenterology Progress Note  Kristen [name] 89 y.o. 1933/01/18   Subjective: Feels very nauseous and unable to eat yesterday. Abdominal pain.   Objective: Vital signs: Vitals:   12/26/23 0510 12/26/23 1039  BP: (!) 164/59 (!) 160/50  Pulse: (!) 58 (!) 53  Resp: 17 20  Temp: (!) 97.5 F (36.4 C) (!) 97.5 F (36.4 C)  SpO2: 100% 100%    Physical Exam: Gen: lethargic, elderly, thin, no acute distress  HEENT: anicteric sclera CV: RRR Chest: CTA B Abd: epigastric tenderness with guarding, soft, nondistended, +BS Ext: no edema  Lab Results: Recent Labs    12/25/23 0511 12/26/23 0756  NA 130* 131*  K 3.8 4.5  CL 98 98  CO2 25 23  GLUCOSE 94 92  BUN 16 14  CREATININE 0.96 0.80  CALCIUM 8.8* 8.9  MG  --  1.5*  PHOS  --  3.7   Recent Labs    12/24/23 1032 12/26/23 0756  AST 19  --   ALT 13  --   ALKPHOS 71  --   BILITOT 0.8  --   PROT 6.4*  --   ALBUMIN 3.7 3.6   Recent Labs    12/24/23 1032 12/25/23 0511 12/26/23 0756  WBC 7.4 5.8 6.5  NEUTROABS 4.9  --   --   HGB 11.1* 10.8* 11.4*  HCT 32.2* 32.7* 33.4*  MCV 93.9 96.5 93.6  PLT 212 197 223      Assessment/Plan: Chronic nausea in the setting of lung cancer and chronic pain history. Unable to eat. Will do EGD tomorrow to evaluate for peptic ulcer disease. Changed diet to clear liquid diet. NPO p MN. Dr. Lorenso Quarry will do EGD tomorrow.   Kristen [name] 12/26/2023, 12:00 PM  Questions please call [phone] ID: Kristen [name], female   DOB: 11-18-1932, 89 y.o.   MRN: [id number]

## 2023-12-26 NOTE — Care Management Obs Status (Signed)
 MEDICARE OBSERVATION STATUS NOTIFICATION   Patient Details  Name: Kristen [name] MRN: [id number] Date of Birth: 1933/01/18   Medicare Observation Status Notification Given:  Yes    Adrian Prows, RN 12/26/2023, 10:38 AM

## 2023-12-27 ENCOUNTER — Inpatient Hospital Stay (HOSPITAL_COMMUNITY): Admitting: Anesthesiology

## 2023-12-27 ENCOUNTER — Encounter: Payer: Self-pay | Admitting: Licensed Clinical Social Worker

## 2023-12-27 ENCOUNTER — Encounter (HOSPITAL_COMMUNITY): Payer: Self-pay | Admitting: Student

## 2023-12-27 ENCOUNTER — Encounter (HOSPITAL_COMMUNITY)
Admission: EM | Disposition: A | Discharge status: Home / Self Care | Payer: Self-pay | Source: Home / Self Care | Attending: Internal Medicine

## 2023-12-27 DIAGNOSIS — E871 Hypo-osmolality and hyponatremia: Secondary | ICD-10-CM | POA: Diagnosis not present

## 2023-12-27 LAB — CBC
HCT: 29.7 % — ABNORMAL LOW (ref 36.0–46.0)
Hemoglobin: 10.2 g/dL — ABNORMAL LOW (ref 12.0–15.0)
MCH: 32.3 pg (ref 26.0–34.0)
MCHC: 34.3 g/dL (ref 30.0–36.0)
MCV: 94 fL (ref 80.0–100.0)
Platelets: 202 10*3/uL (ref 150–400)
RBC: 3.16 MIL/uL — ABNORMAL LOW (ref 3.87–5.11)
RDW: 12.7 % (ref 11.5–15.5)
WBC: 8.8 10*3/uL (ref 4.0–10.5)
nRBC: 0 % (ref 0.0–0.2)

## 2023-12-27 LAB — RENAL FUNCTION PANEL
Albumin: 3.3 g/dL — ABNORMAL LOW (ref 3.5–5.0)
Anion gap: 8 (ref 5–15)
BUN: 12 mg/dL (ref 8–23)
CO2: 25 mmol/L (ref 22–32)
Calcium: 8.7 mg/dL — ABNORMAL LOW (ref 8.9–10.3)
Chloride: 96 mmol/L — ABNORMAL LOW (ref 98–111)
Creatinine, Ser: 0.83 mg/dL (ref 0.44–1.00)
GFR, Estimated: >60 mL/min (ref >60)
Glucose, Bld: 84 mg/dL (ref 70–99)
Phosphorus: 3.7 mg/dL (ref 2.5–4.6)
Potassium: 3.7 mmol/L (ref 3.5–5.1)
Sodium: 129 mmol/L — ABNORMAL LOW (ref 135–145)

## 2023-12-27 LAB — MAGNESIUM: Magnesium: 1.9 mg/dL (ref 1.7–2.4)

## 2023-12-27 SURGERY — CANCELLED PROCEDURE
Anesthesia: Monitor Anesthesia Care

## 2023-12-27 MED ORDER — ERYTHROMYCIN BASE 250 MG PO TABS
250.0000 mg | ORAL_TABLET | Freq: Three times a day (TID) | ORAL | Status: DC
  Administered 2023-12-27 – 2023-12-28 (×3): 250 mg via ORAL
  Filled 2023-12-27 (×4): qty 1

## 2023-12-27 MED ORDER — CYANOCOBALAMIN 1000 MCG/ML IJ SOLN: 100.0000 ug | INTRAMUSCULAR | Status: DC

## 2023-12-27 MED ORDER — FAMOTIDINE 20 MG PO TABS
20.0000 mg | ORAL_TABLET | Freq: Every day | ORAL | Status: DC
  Administered 2023-12-27 – 2023-12-29 (×3): 20 mg via ORAL
  Filled 2023-12-27 (×3): qty 1

## 2023-12-27 MED ORDER — SUCRALFATE 1 GM/10ML PO SUSP
1.0000 g | Freq: Three times a day (TID) | ORAL | Status: DC
  Administered 2023-12-27 – 2023-12-30 (×11): 1 g via ORAL
  Filled 2023-12-27 (×11): qty 10

## 2023-12-27 MED ORDER — DICLOFENAC SODIUM 1 % EX GEL
4.0000 g | Freq: Four times a day (QID) | CUTANEOUS | Status: DC | PRN
  Administered 2023-12-30: 4 g via TOPICAL
  Filled 2023-12-27: qty 100

## 2023-12-27 SURGICAL SUPPLY — 13 items
BLOCK BITE 60FR ADLT L/F BLUE (MISCELLANEOUS) ×2 IMPLANT
ELECT REM PT RETURN 9FT ADLT (ELECTROSURGICAL) IMPLANT
ELECTRODE REM PT RTRN 9FT ADLT (ELECTROSURGICAL) IMPLANT
FORCEP RJ3 GP 1.8X160 W-NEEDLE (CUTTING FORCEPS) IMPLANT
FORCEPS BIOP RAD 4 LRG CAP 4 (CUTTING FORCEPS) IMPLANT
NEEDLE SCLEROTHERAPY 25GX240 (NEEDLE) IMPLANT
PROBE APC STR FIRE (PROBE) IMPLANT
PROBE INJECTION GOLD 7FR (MISCELLANEOUS) IMPLANT
SNARE SHORT THROW 13M SML OVAL (MISCELLANEOUS) IMPLANT
SYR 50ML LL SCALE MARK (SYRINGE) IMPLANT
TUBING ENDO SMARTCAP PENTAX (MISCELLANEOUS) ×4 IMPLANT
TUBING IRRIGATION ENDOGATOR (MISCELLANEOUS) ×2 IMPLANT
WATER STERILE IRR 1000ML POUR (IV SOLUTION) IMPLANT

## 2023-12-27 NOTE — Hospital Course (Addendum)
 89 year old F with PMH of COPD, lung nodule concerning for adenocarcinoma, PET scan on 11/26/2023 concerning for adenocarcinoma in RUL, right renal lesion and hypermetabolic activity in sigmoid colon-followed by gi pccm, chronic pain on p.o. Dilaudid, PE on Eliquis, hyponatremia, HTN, HLD, anxiety and depression presented with chronic nausea with vomiting, poor p.o. intake and unintentional weight loss AND admitted for nausea along with hyponatremia for further workup of abnormal PET scan.  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  Patient was also found to have a urinary tract infection with urine cultures growing out E. coli.  Patient initiated on antibiotics in case this was possibly contributing to patient's ongoing nausea.  Gastroenterology was consulted and it was recommended the patient be placed on  empiric PPI and sucralfate as well as erythromycin.after experiencing no improvement in symptoms patient underwent upper endoscopy on 3/6 by Dr. Lorenso Quarry.  This revealed some erythematous mucosa in the gastric body and multiple gastric polyps but no obvious etiology for the patient's symptoms.  With a negative workup thus far erythromycin was discontinued and patient was instead placed on a trial regimen of metoclopramide and arrangements were made for the patient to follow-up with gastroenterology in the outpatient setting.  Patient was also discharged on the remainder of her antibiotics for her urinary tract infection as well.    Gastroenterology plans on pursuing evaluations for small intestinal bacterial overgrowth and hydrogen breath testing as well as possible nuclear gastric emptying study in the outpatient setting.  Patient was discharged home in improved and stable condition on 12/30/2023.

## 2023-12-27 NOTE — Progress Notes (Signed)
 [facility] Progress Note  SUBJECTIVE:   Interval history: Kaislee Chao was seen and evaluated today at bedside in endoscopy unit prior to planned EGD.  Per review of chart, patient received Eliquis last evening, Eliquis has not been paused prior to procedure.  Per review of chart, patient pending EGD to rule out peptic ulcer disease as contributing factor to chronic nausea.  Discussed risks of proceeding with procedure today including bleeding with patient's daughter and patient, understandably frustrated with process, I ultimately advised against completing EGD as patient is not actively bleeding and procedure could be planned with hold of Eliquis x 2 days.  Could consider gastric emptying scan, though patient has chronic pain disorder/trigeminal neuralgia and has been on Dilaudid long-term with no changes in dosing, patient's daughter does not feel that she would tolerate holding this medication in order to complete gastric emptying scan.  We discussed starting empiric erythromycin along with a probiotic for gastric motility.  If this did not improve symptoms could consider metoclopramide which has previously helped her.  Patient and family agreeable with this plan.  Past Medical History:  Diagnosis Date   Anemia    Arthritis    B12 deficiency    Chronic kidney disease    Chronic pain disorder    trigeminal neuralgia   Depression    Dyslipidemia    Hypertension    PONV (postoperative nausea and vomiting)    Seizure disorder (HCC)    decades since last seizure   Tremor    Trigeminal nerve disorder    Past Surgical History:  Procedure Laterality Date   ABDOMINAL HYSTERECTOMY     BLADDER REPAIR     tack   CATARACT EXTRACTION     CHOLECYSTECTOMY     gamma knife  2004   NOSE SURGERY     for fracture   SHOULDER ARTHROSCOPY DISTAL CLAVICLE EXCISION AND OPEN ROTATOR CUFF REPAIR Left    rotary cuff repair   TONSILLECTOMY     TOTAL HIP ARTHROPLASTY Right 10/30/2023   Procedure:  TOTAL HIP ARTHROPLASTY ANTERIOR APPROACH;  Surgeon: Durene Romans, MD;  Location: [facility] OR;  Service: Orthopedics;  Laterality: Right;   TOTAL KNEE ARTHROPLASTY Bilateral    UMBILICAL HERNIA REPAIR     Current Facility-Administered Medications  Medication Dose Route Frequency Provider Last Rate Last Admin   0.9 %  sodium chloride infusion   Intravenous Continuous Charlott Rakes, MD 20 mL/hr at 12/26/23 1519 New Bag at 12/26/23 1519   [MAR Hold] acetaminophen (TYLENOL) tablet 650 mg  650 mg Oral Q6H PRN Maryln Gottron, MD   650 mg at 12/26/23 2026   Or   [MAR Hold] acetaminophen (TYLENOL) suppository 650 mg  650 mg Rectal Q6H PRN Kirby Crigler, Mir M, MD       [MAR Hold] albuterol (PROVENTIL) (2.5 MG/3ML) 0.083% nebulizer solution 2.5 mg  2.5 mg Nebulization Q2H PRN Kirby Crigler, Mir M, MD       [MAR Hold] amLODipine (NORVASC) tablet 10 mg  10 mg Oral Daily Candelaria Stagers T, MD   10 mg at 12/27/23 0830   [MAR Hold] apixaban (ELIQUIS) tablet 5 mg  5 mg Oral BID Candelaria Stagers T, MD   5 mg at 12/26/23 2027   [facility] Hold] HYDROmorphone (DILAUDID) tablet 2 mg  2 mg Oral TID PRN Maryln Gottron, MD   2 mg at 12/26/23 2218   [facility] Hold] hydrOXYzine (ATARAX) tablet 25 mg  25 mg Oral QHS Kirby Crigler, Mir Judie Petit, MD   25  mg at 12/26/23 2026   [MAR Hold] ondansetron (ZOFRAN) tablet 4 mg  4 mg Oral Q6H PRN Kirby Crigler, Mir M, MD   4 mg at 12/25/23 1425   Or   [MAR Hold] ondansetron (ZOFRAN) injection 4 mg  4 mg Intravenous Q6H PRN Kirby Crigler, Mir M, MD       [MAR Hold] pantoprazole (PROTONIX) EC tablet 40 mg  40 mg Oral Daily Kirby Crigler, Mir M, MD   40 mg at 12/27/23 0830   [MAR Hold] traZODone (DESYREL) tablet 25 mg  25 mg Oral QHS PRN Maryln Gottron, MD   25 mg at 12/26/23 2026   Allergies as of 12/24/2023 - Review Complete 12/24/2023  Allergen Reaction Noted   Penicillins Hives and Rash 07/18/2013   Tetracyclines & related Anaphylaxis 07/18/2013   Sulfa antibiotics Hives and Other (See Comments) 04/23/2015    Review of Systems:  Review of Systems  Respiratory:  Negative for shortness of breath.   Cardiovascular:  Negative for chest pain.  Gastrointestinal:  Positive for abdominal pain and nausea. Negative for constipation, diarrhea and vomiting.    OBJECTIVE:   Temp:  [97.5 F (36.4 C)-98 F (36.7 C)] 97.8 F (36.6 C) (03/03 1257) Pulse Rate:  [58-77] 63 (03/03 1257) Resp:  [16-20] 16 (03/03 1257) BP: (134-157)/(39-65) 136/61 (03/03 1257) SpO2:  [99 %-100 %] 100 % (03/03 1257) Weight:  [51.8 kg] 51.8 kg (03/03 1144) Last BM Date : 12/25/23 Physical Exam Constitutional:      General: She is not in acute distress.    Appearance: She is not ill-appearing, toxic-appearing or diaphoretic.  Cardiovascular:     Rate and Rhythm: Normal rate and regular rhythm.  Pulmonary:     Effort: No respiratory distress.     Breath sounds: Normal breath sounds.  Abdominal:     General: Bowel sounds are normal. There is no distension.     Palpations: Abdomen is soft.     Tenderness: There is no abdominal tenderness. There is no guarding.  Skin:    General: Skin is warm and dry.     Coloration: Skin is pale.  Neurological:     Mental Status: She is alert.     Labs: Recent Labs    12/25/23 0511 12/26/23 0756 12/27/23 0512  WBC 5.8 6.5 8.8  HGB 10.8* 11.4* 10.2*  HCT 32.7* 33.4* 29.7*  PLT 197 223 202   BMET Recent Labs    12/25/23 0511 12/26/23 0756 12/27/23 0512  NA 130* 131* 129*  K 3.8 4.5 3.7  CL 98 98 96*  CO2 25 23 25   GLUCOSE 94 92 84  BUN 16 14 12   CREATININE 0.96 0.80 0.83  CALCIUM 8.8* 8.9 8.7*   LFT Recent Labs    12/27/23 0512  ALBUMIN 3.3*   PT/INR No results for input(s): "LABPROT", "INR" in the last 72 hours. Diagnostic imaging: No results found.  IMPRESSION: Intractable nausea Chronic pain syndrome, trigeminal neuralgia, on Dilaudid Unintentional weight loss History dysphagia  -Esophagram 04/02/2022 showed severe esophageal dysmotility Non-small  cell lung cancer Hypermetabolic sigmoid activity on PET scan 11/26/2023 Chronic obstructive pulmonary disease  PLAN: -Cancel EGD for today as patient had Eliquis last evening, hold Eliquis -Start empiric erythromycin to improve gastric motility -Continue PPI therapy in a.m., start H2 RA in p.m. -Start empiric sucralfate for possible peptic ulcer disease -Limit narcotics as able -Low fiber/low fat diet, monitor bowel movements -[facility] will follow   LOS: 1 day   Liliane Shi, DO [facility]

## 2023-12-27 NOTE — Progress Notes (Signed)
 MD notified that patient stated to RN that she is experiencing burning and urgency while urinating, RN suggested urinalysis order and medication to relieve burning sensation, no orders placed as of currently.

## 2023-12-27 NOTE — Anesthesia Preprocedure Evaluation (Addendum)
 Anesthesia Evaluation  Patient identified by MRN, date of birth, ID band Patient awake    Reviewed: Allergy & Precautions, NPO status , Patient's Chart, lab work & pertinent test results  History of Anesthesia Complications (+) PONV and history of anesthetic complications  Airway Mallampati: II  TM Distance: >3 FB Neck ROM: Full    Dental  (+) Dental Advisory Given, Missing   Pulmonary neg pulmonary ROS   Pulmonary exam normal        Cardiovascular hypertension, Pt. on medications Normal cardiovascular exam     Neuro/Psych Seizures -, Well Controlled,  PSYCHIATRIC DISORDERS  Depression       GI/Hepatic Neg liver ROS,GERD  Medicated and Controlled,,  Endo/Other   Na 129 (chronic)    Renal/GU CRFRenal disease     Musculoskeletal  (+) Arthritis ,    Abdominal   Peds  Hematology  (+) Blood dyscrasia, anemia  On eliquis    Anesthesia Other Findings Chronic pain   Reproductive/Obstetrics                             Anesthesia Physical Anesthesia Plan  ASA: 3  Anesthesia Plan: MAC   Post-op Pain Management: Minimal or no pain anticipated   Induction:   PONV Risk Score and Plan: 3 and Propofol infusion and Treatment may vary due to age or medical condition  Airway Management Planned: Nasal Cannula and Natural Airway  Additional Equipment: None  Intra-op Plan:   Post-operative Plan:   Informed Consent: I have reviewed the patients History and Physical, chart, labs and discussed the procedure including the risks, benefits and alternatives for the proposed anesthesia with the patient or authorized representative who has indicated his/her understanding and acceptance.   Patient has DNR.  Discussed DNR with patient, Discussed DNR with power of attorney and Suspend DNR.   Consent reviewed with POA  Plan Discussed with: CRNA and Anesthesiologist  Anesthesia Plan Comments:         Anesthesia Quick Evaluation

## 2023-12-27 NOTE — Progress Notes (Signed)
 Sent urine sample to lab via tube system.

## 2023-12-27 NOTE — Progress Notes (Signed)
 PROGRESS NOTE Kristen [name]  UEA:[id number] DOB: Mar 21, 1933 DOA: 12/24/2023 PCP: Emilio Aspen, MD  Brief Narrative/Hospital Course: 89 year old F with PMH of COPD, lung nodule concerning for adenocarcinoma, PET scan on 11/26/2023 concerning for adenocarcinoma in RUL, right renal lesion and hypermetabolic activity in sigmoid colon-followed by gi pccm, chronic pain on p.o. Dilaudid, PE on Eliquis, hyponatremia, HTN, HLD, anxiety and depression presented with chronic nausea with vomiting, poor p.o. intake and unintentional weight loss AND admitted for nausea along with hyponatremia for further workup of abnormal PET scan  Procedure: none  Consultation: Gastroenterology    Subjective: Seen and examined this morning She is resting comfortably, reports she is hungry, denies any pain or nausea. 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  TSH a.m. cortisol stable.continue to monitor Recent Labs  Lab 12/24/23 1032 12/24/23 1944 12/25/23 0511 12/26/23 0756 12/27/23 0512  NA 128* 129* 130* 131* 129*     Chronic nausea: Ongoing x 6 months unclear etiology likely opiate induced versus malignancy versus GI/motility related, abdomen exam benign, GI following for EGD today but holding due to Eliquis.  Continue PPI, antiemetics prn   Essential hypertension: BP remains well-controlled.  losartan on hold, cont increased dose of amlodipine.    RUL nodule: She had PET scan on 1/31 that showed 2.9 x 2.5 cm RUL nodule and 3.0 x 0.9 cm bandlike lingual and density concerning for adenocarcinoma. As per daughter \\she  had a discussion with pulmonology, Dr. Delton Coombes earlier last month.  At that time, the plan was to monitor.   Focal  hypermetabolic activity in sigmoid colon: Noted on recent PET/CT.  Correlation with colonoscopy is recommended, GI is following defer further workup to GI    Chronic pain: As per daughter has atypical facial pain and headache for which she has been put on p.o. Dilaudid by pain clinic.  This could contribute to her chronic nausea, continue her home regimen of [place].   History of PE: Eliquis held today as EGD had to be canceled.  Last CT angio chest abdomen 11/15/2021 had nonocclusive distal central left pulmonary embolus.   Anxiety and depression:  Off Effexor, continue on Atarax nightly   Hypomagnesemia Mag level stable.   Right renal lesion: Noted on recent PET scan.  Referred to urology and felt to be simple cyst.  DVT prophylaxis: SCDs Start: 12/24/23 1327 Code Status:   Code Status: Do not attempt resuscitation (DNR) PRE-ARREST INTERVENTIONS DESIRED Family Communication: plan of care discussed with patient/none at bedside. Patient status is: Remains hospitalized because of severity of illness Level of care: Med-Surg   Dispo: The [facility] is [facility]: [facility]            Anticipated disposition: TBD Objective: Vitals last 24 hrs: Vitals:   12/26/23 1039 12/26/23 1322 12/26/23 2228 12/27/23 0631  BP: (!) 160/50 134/63 (!) 157/58 (!) 156/65  Pulse: (!) 53 67 (!) 58 77  Resp: 20 20 18 17   Temp: (!) 97.5 F (36.4 C) (!) 97.5 F (36.4 C) 97.7 F (36.5 C) 98 F (36.7 C)  TempSrc: Oral Oral Oral Oral  SpO2: 100% 100% 100% 99%  Weight:      Height:       Weight change:   Physical Examination: General exam:  alert awake,at baseline, older than stated age HEENT:Oral mucosa moist, Ear/Nose WNL grossly Respiratory system: Bilaterally clear BS,no use of accessory muscle Cardiovascular system: S1 & S2 +, No JVD. Gastrointestinal system: Abdomen soft,NT,ND, BS+ Nervous System: Alert, awake, moving all extremities,and following commands. Extremities: LE edema neg,distal  peripheral pulses palpable and warm.  Skin: No rashes,no icterus. MSK: Normal muscle bulk,tone, power   Medications reviewed:  Scheduled Meds:  amLODipine  10 mg Oral Daily   apixaban  5 mg Oral BID   hydrOXYzine  25 mg Oral QHS   pantoprazole  40 mg Oral Daily   Continuous Infusions:  sodium chloride 20 mL/hr at 12/26/23 1519     Diet Order             Diet NPO time specified Except for: Sips with Meds  Diet effective midnight                  Intake/Output Summary (Last 24 hours) at 12/27/2023 1116 Last data filed at 12/27/2023 1000 Gross per 24 hour  Intake 273.04 ml  Output --  Net 273.04 ml   Net IO Since Admission: 1,413.04 mL [12/27/23 1116]  Wt Readings from Last 3 Encounters:  12/25/23 51.8 kg  12/09/23 56 kg  11/18/23 56.2 kg     Unresulted Labs (From admission, onward)    None     Data Reviewed: I have personally reviewed following labs and imaging studies CBC: Recent Labs  Lab 12/24/23 1032 12/25/23 0511 12/26/23 0756 12/27/23 0512  WBC 7.4 5.8 6.5 8.8  NEUTROABS 4.9  --   --   --   HGB 11.1* 10.8* 11.4* 10.2*  HCT 32.2* 32.7* 33.4* 29.7*  MCV 93.9 96.5 93.6 94.0  PLT 212 197 223 202   Basic Metabolic Panel:  Recent Labs  Lab 12/24/23 1032 12/24/23 1944 12/25/23 0511 12/26/23 0756 12/27/23 0512  NA 128* 129* 130* 131* 129*  K 4.7 4.1 3.8 4.5 3.7  CL 96* 100 98 98 96*  CO2 24 22 25 23 25   GLUCOSE 101* 104* 94 92 84  BUN 23 18 16 14 12   CREATININE 1.00 0.92 0.96 0.80 0.83  CALCIUM 8.9 8.2* 8.8* 8.9 8.7*  MG  --   --   --  1.5* 1.9  PHOS  --   --   --  3.7 3.7   GFR: Estimated Creatinine Clearance: 37.1 mL/min (by C-G formula based on SCr of 0.83 mg/dL). Liver Function Tests:  Recent Labs  Lab 12/24/23 1032 12/26/23 0756 12/27/23 0512  AST 19  --   --   ALT 13  --   --   ALKPHOS 71  --   --   BILITOT 0.8  --   --   PROT 6.4*  --   --   ALBUMIN 3.7 3.6 3.3*   Recent Labs  Lab 12/24/23 1032  LIPASE 36   No results for  input(s): "AMMONIA" in the last 168 hours. Recent Labs    12/25/23 0611  TSH 1.031   Sepsis Labs: No results for input(s): "PROCALCITON", "LATICACIDVEN" in the last 168 hours. No results found for this or any previous visit (from the past 240 hours).  Antimicrobials/Microbiology: Anti-infectives (From admission, onward)    None         Component Value Date/Time   SDES  08/05/2023 0946    URINE, RANDOM Performed at [facility], [address], [place], [place] [address]    [employer]  08/05/2023 0946    NONE Reflexed from [id number] Performed at [facility], [address] [place], [place] [address]    CULT (A) 08/05/2023 0946    30,000 COLONIES/mL MULTIPLE SPECIES PRESENT, SUGGEST RECOLLECTION   REPTSTATUS 08/06/2023 FINAL 08/05/2023 9811     Radiology Studies: No results found.   LOS: 1 day   Total time spent in review of labs and imaging, patient evaluation, formulation of plan, documentation and communication with family: 35 minutes  Lanae Boast, MD  [facility]  12/27/2023, 11:16 AM

## 2023-12-28 ENCOUNTER — Ambulatory Visit (HOSPITAL_COMMUNITY): Payer: 59

## 2023-12-28 ENCOUNTER — Encounter (HOSPITAL_COMMUNITY): Payer: Self-pay

## 2023-12-28 DIAGNOSIS — E871 Hypo-osmolality and hyponatremia: Secondary | ICD-10-CM | POA: Diagnosis not present

## 2023-12-28 LAB — CBC
HCT: 35.4 % — ABNORMAL LOW (ref 36.0–46.0)
Hemoglobin: 11.7 g/dL — ABNORMAL LOW (ref 12.0–15.0)
MCH: 32 pg (ref 26.0–34.0)
MCHC: 33.1 g/dL (ref 30.0–36.0)
MCV: 96.7 fL (ref 80.0–100.0)
Platelets: 226 10*3/uL (ref 150–400)
RBC: 3.66 MIL/uL — ABNORMAL LOW (ref 3.87–5.11)
RDW: 12.9 % (ref 11.5–15.5)
WBC: 9.9 10*3/uL (ref 4.0–10.5)
nRBC: 0 % (ref 0.0–0.2)

## 2023-12-28 LAB — URINALYSIS, ROUTINE W REFLEX MICROSCOPIC
Bilirubin Urine: NEGATIVE
Glucose, UA: NEGATIVE mg/dL
Ketones, ur: NEGATIVE mg/dL
Nitrite: POSITIVE — AB
Protein, ur: NEGATIVE mg/dL
Specific Gravity, Urine: 1.004 — ABNORMAL LOW (ref 1.005–1.030)
WBC, UA: >50 WBC/hpf (ref 0–5)
pH: 6 (ref 5.0–8.0)

## 2023-12-28 LAB — BASIC METABOLIC PANEL
Anion gap: 10 (ref 5–15)
BUN: 16 mg/dL (ref 8–23)
CO2: 23 mmol/L (ref 22–32)
Calcium: 9 mg/dL (ref 8.9–10.3)
Chloride: 95 mmol/L — ABNORMAL LOW (ref 98–111)
Creatinine, Ser: 0.94 mg/dL (ref 0.44–1.00)
GFR, Estimated: 58 mL/min — ABNORMAL LOW (ref >60)
Glucose, Bld: 91 mg/dL (ref 70–99)
Potassium: 4.3 mmol/L (ref 3.5–5.1)
Sodium: 128 mmol/L — ABNORMAL LOW (ref 135–145)

## 2023-12-28 MED ORDER — ERYTHROMYCIN BASE 250 MG PO TABS
500.0000 mg | ORAL_TABLET | Freq: Three times a day (TID) | ORAL | Status: DC
  Administered 2023-12-28 – 2023-12-30 (×6): 500 mg via ORAL
  Filled 2023-12-28 (×9): qty 2

## 2023-12-28 MED ORDER — SACCHAROMYCES BOULARDII 250 MG PO CAPS
250.0000 mg | ORAL_CAPSULE | Freq: Two times a day (BID) | ORAL | Status: DC
  Administered 2023-12-28 – 2023-12-29 (×4): 250 mg via ORAL
  Filled 2023-12-28 (×5): qty 1

## 2023-12-28 NOTE — Progress Notes (Signed)
 [facility] Progress Note  SUBJECTIVE:   Interval history: Kristen [name] was seen and evaluated today at bedside.  Seated in chair at bedside, noted that she has significant nausea after eating.  Despite this, she is eating her diet, 75% of meal last evening, requesting more food today from nursing staff.  She noted having significant headache pain, only receiving one of her 2 pills for pain today.  Past Medical History:  Diagnosis Date   Anemia    Arthritis    B12 deficiency    Chronic kidney disease    Chronic pain disorder    trigeminal neuralgia   Depression    Dyslipidemia    Hypertension    PONV (postoperative nausea and vomiting)    Seizure disorder (HCC)    decades since last seizure   Tremor    Trigeminal nerve disorder    Past Surgical History:  Procedure Laterality Date   ABDOMINAL HYSTERECTOMY     BLADDER REPAIR     tack   CATARACT EXTRACTION     CHOLECYSTECTOMY     gamma knife  2004   NOSE SURGERY     for fracture   SHOULDER ARTHROSCOPY DISTAL CLAVICLE EXCISION AND OPEN ROTATOR CUFF REPAIR Left    rotary cuff repair   TONSILLECTOMY     TOTAL HIP ARTHROPLASTY Right 10/30/2023   Procedure: TOTAL HIP ARTHROPLASTY ANTERIOR APPROACH;  Surgeon: Durene Romans, MD;  Location: [facility] OR;  Service: Orthopedics;  Laterality: Right;   TOTAL KNEE ARTHROPLASTY Bilateral    UMBILICAL HERNIA REPAIR     Current Facility-Administered Medications  Medication Dose Route Frequency Provider Last Rate Last Admin   acetaminophen (TYLENOL) tablet 650 mg  650 mg Oral Q6H PRN Kirby Crigler, Mir M, MD   650 mg at 12/26/23 2026   Or   acetaminophen (TYLENOL) suppository 650 mg  650 mg Rectal Q6H PRN Kirby Crigler, Mir M, MD       albuterol (PROVENTIL) (2.5 MG/3ML) 0.083% nebulizer solution 2.5 mg  2.5 mg Nebulization Q2H PRN Kirby Crigler, Mir M, MD       amLODipine (NORVASC) tablet 10 mg  10 mg Oral Daily Candelaria Stagers T, MD   10 mg at 12/28/23 9604   cyanocobalamin (VITAMIN B12) injection  100 mcg  100 mcg Intramuscular Q30 days Kc, Dayna Barker, MD       diclofenac Sodium (VOLTAREN) 1 % topical gel 4 g  4 g Topical QID PRN Kc, Ramesh, MD       erythromycin (E-MYCIN) tablet 250 mg  250 mg Oral TID Liliane Shi H, DO   250 mg at 12/28/23 0819   famotidine (PEPCID) tablet 20 mg  20 mg Oral QHS Liliane Shi H, DO   20 mg at 12/27/23 2053   HYDROmorphone (DILAUDID) tablet 2 mg  2 mg Oral TID PRN Maryln Gottron, MD   2 mg at 12/28/23 0931   hydrOXYzine (ATARAX) tablet 25 mg  25 mg Oral QHS Kirby Crigler, Mir M, MD   25 mg at 12/27/23 2053   ondansetron (ZOFRAN) tablet 4 mg  4 mg Oral Q6H PRN Kirby Crigler, Mir M, MD   4 mg at 12/25/23 1425   Or   ondansetron (ZOFRAN) injection 4 mg  4 mg Intravenous Q6H PRN Kirby Crigler, Mir M, MD       pantoprazole (PROTONIX) EC tablet 40 mg  40 mg Oral Daily Kirby Crigler, Mir M, MD   40 mg at 12/28/23 0820   saccharomyces boulardii (FLORASTOR) capsule 250 mg  250 mg  Oral BID Lynann Bologna, DO   250 mg at 12/28/23 1007   sucralfate (CARAFATE) 1 GM/10ML suspension 1 g  1 g Oral TID WC & HS Liliane Shi H, DO   1 g at 12/28/23 1006   traZODone (DESYREL) tablet 25 mg  25 mg Oral QHS PRN Maryln Gottron, MD   25 mg at 12/27/23 2054   Allergies as of 12/24/2023 - Review Complete 12/24/2023  Allergen Reaction Noted   Penicillins Hives and Rash 07/18/2013   Tetracyclines & related Anaphylaxis 07/18/2013   Sulfa antibiotics Hives and Other (See Comments) 04/23/2015   Review of Systems:  Review of Systems  Respiratory:  Negative for shortness of breath.   Cardiovascular:  Negative for chest pain.  Gastrointestinal:  Positive for abdominal pain and nausea.  Neurological:  Positive for headaches.    OBJECTIVE:   Temp:  [97.5 F (36.4 C)-97.8 F (36.6 C)] 97.8 F (36.6 C) (03/04 0538) Pulse Rate:  [59-70] 70 (03/04 0718) Resp:  [17-18] 18 (03/04 0718) BP: (115-148)/(52-64) 143/64 (03/04 0718) SpO2:  [99 %-100 %] 100 % (03/04 0538) Last BM  Date : 12/25/23 Physical Exam Constitutional:      General: She is not in acute distress.    Appearance: She is not ill-appearing, toxic-appearing or diaphoretic.  Cardiovascular:     Rate and Rhythm: Normal rate and regular rhythm.  Pulmonary:     Effort: No respiratory distress.     Breath sounds: Normal breath sounds.  Abdominal:     General: Bowel sounds are normal. There is no distension.     Palpations: Abdomen is soft.     Tenderness: There is abdominal tenderness. There is no guarding.  Skin:    General: Skin is warm and dry.  Neurological:     Mental Status: She is alert.     Labs: Recent Labs    12/26/23 0756 12/27/23 0512 12/28/23 0528  WBC 6.5 8.8 9.9  HGB 11.4* 10.2* 11.7*  HCT 33.4* 29.7* 35.4*  PLT 223 202 226   BMET Recent Labs    12/26/23 0756 12/27/23 0512 12/28/23 0528  NA 131* 129* 128*  K 4.5 3.7 4.3  CL 98 96* 95*  CO2 23 25 23   GLUCOSE 92 84 91  BUN 14 12 16   CREATININE 0.80 0.83 0.94  CALCIUM 8.9 8.7* 9.0   LFT Recent Labs    12/27/23 0512  ALBUMIN 3.3*   PT/INR No results for input(s): "LABPROT", "INR" in the last 72 hours. Diagnostic imaging: No results found.  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 -Continue empiric PPI and sucralfate -Continue erythromycin, increase to 500 mg PO TID -Avoid narcotics as able -Continue Florastor  -Can consider EGD once Eliquis held x 2 days if no other etiology found -[facility] will follow   LOS: 2 days   Liliane Shi,  DO [facility]

## 2023-12-28 NOTE — TOC Progression Note (Signed)
 Transition of Care [facility]) - Progression Note    Patient Details  Name: Kristen [name] MRN: [id number] Date of Birth: 14-Nov-1932  Transition of [facility]) CM/SW Contact  Amada Jupiter, LCSW Phone Number: 12/28/2023, 3:44 PM  Clinical Narrative:     Met with pt and spoke with daughter today to address questions about dc level of care.  Pt expressed that she very much wants to return to her IL apt at [facility].  Daughter is concerned that pt may now need to consider an increase in level of support/ ALF.  Pt is aware of daughter's concerns and states, "If I really believed I needed more help then I would agree with that no problem but I don't think it's needed yet."  Encouraged her to continue talking with daughter and to consider looking into local ALFs to, at lease, narrow her choices of facilities so they are prepared if she gets to that level of need.     Expected Discharge Plan: Home/Self Care Barriers to Discharge: Continued Medical Work up  Expected Discharge Plan and Services   Discharge Planning Services: CM Consult   Living arrangements for the past 2 months: Independent Living Facility                                       Social Determinants of Health (SDOH) Interventions SDOH Screenings   Food Insecurity: No Food Insecurity (12/26/2023)  Housing: Low Risk  (12/26/2023)  Transportation Needs: No Transportation Needs (12/26/2023)  Utilities: Not At Risk (12/26/2023)  Financial Resource Strain: Low Risk  (06/19/2019)   Received from [facility], [facility]  Social Connections: Socially Isolated (12/25/2023)  Stress: No Stress Concern Present (06/19/2019)   Received from [facility], [facility]  Tobacco Use: Low Risk  (12/27/2023)  Health Literacy: Medium Risk (02/01/2021)   Received from [facility], [facility]    Readmission Risk Interventions    12/28/2023    3:43 PM  Readmission Risk Prevention Plan  Transportation Screening Complete   PCP or Specialist Appt within 3-5 Days Complete  HRI or Home Care Consult Complete  Palliative Care Screening Not Applicable  Medication Review (RN Care Manager) Complete

## 2023-12-28 NOTE — Evaluation (Signed)
 Physical Therapy Evaluation Patient Details Name: Kristen [name] MRN: [id number] DOB: [birth year] Today's Date: 12/28/2023  History of Present Illness  89 year old F presented with chronic nausea with vomiting, poor p.o. 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 All education has been completed and the patient has no further questions.  Pt is doing well at time of PT eval, amb in room I'ly. Amb hallway distance with RW and no LOB. Pt reports she dressed herself as well. She is at baseline for mobility. Pt does report 2 falls in last 2 months, first in January resulting in hip fx,  second ~ 2 wks later resulting in bruising/soft tissue injury.  May benefit from OPPT  for higher level balance and strengthening if pt and dtr desire; pt reports the exercise classes at [facility] are "boring" and you "sit the whole time"  See below for any follow-up Physical Therapy or equipment needs. PT is signing off. Thank you for this referral.         If plan is discharge home, recommend the following:     Can travel by private vehicle        Equipment Recommendations None recommended by PT  Recommendations for Other Services       Functional Status Assessment Patient has had a recent decline in their functional status and demonstrates the ability to make significant improvements in function in a reasonable and predictable amount of time.     Precautions / Restrictions Precautions Precautions: Fall       Mobility  Bed Mobility               General bed mobility comments: pt sitting in chair by window on arrival --has been amb in room I'/y    Transfers Overall transfer level: Needs assistance Equipment used: Rolling walker (2 wheels), None Transfers: Sit to/from Stand Sit to Stand: Modified independent (Device/Increase time)           General transfer comment: no assist    Ambulation/Gait Ambulation/Gait assistance: Modified independent (Device/Increase time) Gait Distance (Feet): 120 Feet (+15') Assistive device: Rolling walker (2 wheels), None Gait Pattern/deviations: Step-through pattern       General Gait Details: pt is able to amb short distance in room without device; 120' with RW and no LOB. pt picks up RW at times to maneuver throughout tighter spaces without unsteadiness or LOB  Stairs            Wheelchair Mobility     Tilt Bed    Modified Rankin (Stroke Patients Only)       Balance Overall balance assessment: History of Falls, Needs assistance Sitting-balance support: No upper extremity supported, Feet supported Sitting balance-Leahy Scale: Good     Standing balance support: During functional activity, No upper extremity supported Standing balance-Leahy Scale: Fair Standing balance comment: Fair+/NT to mod challenges             High level balance activites: Side stepping, Direction changes, Turns, Head turns High Level Balance Comments: use of RW for above, no LOB (  uses rollator at baseline)             Pertinent Vitals/Pain Pain Assessment Pain Assessment: Faces Faces Pain Scale: Hurts a little bit Pain Location: right hip (intermittently) Pain Descriptors / Indicators: Discomfort Pain Intervention(s): Limited activity within patient's tolerance, Monitored during session    Home Living Family/patient expects to be discharged to:: Private residence Living Arrangements: Alone Available Help at Discharge:  Family;Available PRN/intermittently Type of Home: Apartment Home Access: Level entry       Home Layout: One level Home Equipment: Grab bars - tub/shower;Grab bars - toilet;Shower seat;Rollator (4 wheels);Cane - single point;Hand held shower head Additional Comments: Apartment at Independent Living    Prior Function Prior Level of Function : Independent/Modified Independent             Mobility Comments: Walking with rollator vs cane, walks to/from dining hall, takes meals back to her room; ADLs Comments: manages ADLs, breakfast in her apt; manages meds and fiances     Extremity/Trunk Assessment   Upper Extremity Assessment Upper Extremity Assessment: Defer to OT evaluation    Lower Extremity Assessment Lower Extremity Assessment: Overall WFL for tasks assessed (pt moves bil LEs through full ROM, strength grossly 3+ to 4/5)    Cervical / Trunk Assessment Cervical / Trunk Assessment: Normal  Communication   Communication Communication: No apparent difficulties    Cognition Arousal: Alert Behavior During Therapy: WFL for tasks assessed/performed   PT - Cognitive impairments: No apparent impairments                         Following commands: Intact       Cueing Cueing Techniques: Verbal cues     General Comments      Exercises     Assessment/Plan    PT Assessment Patient does not need any further PT services  PT Problem List         PT Treatment Interventions      PT Goals (Current goals can be found in the Care Plan section)  Acute Rehab PT Goals PT Goal Formulation: All assessment and education complete, DC therapy    Frequency       Co-evaluation               AM-PAC PT "6 Clicks" Mobility  Outcome Measure Help needed turning from your back to your side while in a flat bed without using bedrails?: None Help needed moving from lying on your back to sitting on the side of a flat bed without using bedrails?: None Help needed  moving to and from a bed to a chair (including a wheelchair)?: None Help needed standing up from a chair using your arms (e.g., wheelchair or bedside chair)?: None Help needed to walk in hospital room?: None Help needed climbing 3-5 steps with a railing? : A Little 6 Click Score: 23    End of Session   Activity Tolerance: Patient tolerated treatment well Patient left: with call bell/phone within reach;with nursing/sitter in room;in chair   PT Visit Diagnosis: Other abnormalities of gait and mobility (R26.89)    Time: 1610-9604 PT Time Calculation (min) (ACUTE ONLY): 14 min   Charges:   PT Evaluation $PT Eval Low Complexity: 1 Low   PT General Charges $$ ACUTE PT VISIT: 1 Visit         Yani Coventry, PT  Acute Rehab Dept ([facility]) [phone]  12/28/2023   [facility] 12/28/2023, 5:08 PM

## 2023-12-28 NOTE — Progress Notes (Signed)
 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 Dilaudid, PE on Eliquis, hyponatremia, HTN, HLD, anxiety and depression presented with chronic nausea with vomiting, poor p.o. intake and unintentional weight loss AND admitted for nausea along with hyponatremia for further workup of abnormal PET scan  Procedure: none  Consultation: Gastroenterology    Subjective: Seen and examined this morning. Daughter at the bedside Patient somewhat depressed complains of nausea mostly after eating food Vitals labs reviewed, has been afebrile BP stable 1 1 5-1 43, on room air. Labs overall unchanged sodium low at 128 and anemic w/ hb 11.7 from 10.2   Assessment and Plan:  Chronic hyponatremia: Sodium hovering 120-130s, asymptomatic.Likely SIADH based on elevated urine sodium and history of lung cancer along with nausea poor oral intake contributing.  TSH, a.m. cortisol stable. Continue to monitor electrolytes. Recent Labs  Lab 12/24/23 1944 12/25/23 0511 12/26/23 0756 12/27/23 0512 12/28/23 0528  NA 129* 130* 131* 129* 128*     Chronic/intractable nausea: Ongoing x 6 months unclear etiology likely opiate induced versus malignancy versus GI/motility related, abdomen exam benign GI following EGD cancelled 3/3 as pt on eliquis and Eliquis held 3/3.Continue PPI, antiemetics, erythromycin, carafate  per GI Further plan as per GI   Essential hypertension: BP controlled.Losartan on hold, cont increased dose of amlodipine.    RUL nodule: She had PET scan on 1/31 that showed 2.9 x 2.5 cm RUL nodule and 3.0 x 0.9 cm bandlike lingual and density concerning for adenocarcinoma.  As per daughter \\she  had a discussion with pulmonology, Dr. Delton Coombes earlier last month.  At that time, the plan was to monitor.   Focal hypermetabolic activity in sigmoid colon: Noted on recent PET/CT.  Correlation with colonoscopy is recommended, GI is following defer further workup to GI    Chronic pain syndrome/trigeminal neuralgia: As per daughter has atypical facial pain and headache for which she has been put on p.o. Dilaudid by pain clinic.  This could contribute to her chronic nausea, continue her home regimen of [place].   History of PE: Eliquis held for EGD.Last CT angio chest abdomen 11/15/2021 had nonocclusive distal central left pulmonary embolus.   Anxiety and depression:  Off Effexor, continue on Atarax nightly   Hypomagnesemia Mag level stable.   Right renal lesion: Noted on recent PET scan.  Referred to urology and felt to be simple cyst.  DVT prophylaxis: SCDs Start: 12/24/23 1327 Code Status:   Code Status: Do not attempt resuscitation (DNR) PRE-ARREST INTERVENTIONS DESIRED Family Communication: plan of care discussed with patient/daughter updated at bedside. Patient status is: Remains hospitalized because of severity of illness Level of care: Med-Surg   Dispo: [facility]            Anticipated disposition: TBD.  Daughter wants to explore into different living avenue TOC informed.  Obtain PT OT evaluation Objective: Vitals last 24 hrs: Vitals:   12/27/23 1257 12/27/23 2149 12/28/23 0538 12/28/23 0718  BP: 136/61 (!) 115/54 (!) 148/52 (!) 143/64  Pulse: 63 (!) 59 62 70  Resp: 16 17 18 18   Temp: 97.8 F (36.6 C) (!) 97.5 F (36.4 C) 97.8 F (36.6 C)   TempSrc: Oral Oral Oral   SpO2:  100% 99% 100%   Weight:      Height:       Weight change:   Physical Examination: General exam: alert awake, oriented, appears depressed HEENT:Oral mucosa moist, Ear/Nose WNL grossly Respiratory system: Bilaterally clear BS,no use of accessory  muscle Cardiovascular system: S1 & S2 +, No JVD. Gastrointestinal system: Abdomen soft,NT,ND, BS+ Nervous System: Alert, awake, moving all extremities,and following commands. Extremities: LE edema neg,distal peripheral pulses palpable and warm.  Skin: No rashes,no icterus. MSK: Normal muscle bulk,tone, power   Medications reviewed:  Scheduled Meds:  amLODipine  10 mg Oral Daily   cyanocobalamin  100 mcg Intramuscular Q30 days   erythromycin  250 mg Oral TID   famotidine  20 mg Oral QHS   hydrOXYzine  25 mg Oral QHS   pantoprazole  40 mg Oral Daily   saccharomyces boulardii  250 mg Oral BID   sucralfate  1 g Oral TID WC & HS   Continuous Infusions:     Diet Order             Diet regular Room service appropriate? Yes; Fluid consistency: Thin  Diet effective now                  Intake/Output Summary (Last 24 hours) at 12/28/2023 1149 Last data filed at 12/28/2023 1000 Gross per 24 hour  Intake 960 ml  Output 500 ml  Net 460 ml   Net IO Since Admission: 1,873.04 mL [12/28/23 1149]  Wt Readings from Last 3 Encounters:  12/27/23 51.8 kg  12/09/23 56 kg  11/18/23 56.2 kg     Unresulted Labs (From admission, onward)     Start     Ordered   12/28/23 0606  Culture, OB Urine  Add-on,   AD        12/28/23 0605   12/28/23 0500  CBC  Daily,   R      12/27/23 1327   12/28/23 0500  Basic metabolic panel  Daily,   R      12/27/23 1327          Data Reviewed: I have personally reviewed following labs and imaging studies CBC: Recent Labs  Lab 12/24/23 1032 12/25/23 0511 12/26/23 0756 12/27/23 0512 12/28/23 0528  WBC 7.4 5.8 6.5 8.8 9.9  NEUTROABS 4.9  --   --   --   --   HGB 11.1* 10.8* 11.4* 10.2* 11.7*  HCT 32.2* 32.7* 33.4* 29.7* 35.4*  MCV 93.9 96.5 93.6 94.0 96.7  PLT 212 197 223 202 226   Basic Metabolic Panel:  Recent Labs  Lab 12/24/23 1944 12/25/23 0511 12/26/23 0756 12/27/23 0512 12/28/23 0528  NA 129* 130* 131* 129* 128*  K 4.1 3.8 4.5 3.7  4.3  CL 100 98 98 96* 95*  CO2 22 25 23 25 23   GLUCOSE 104* 94 92 84 91  BUN 18 16 14 12 16   CREATININE 0.92 0.96 0.80 0.83 0.94  CALCIUM 8.2* 8.8* 8.9 8.7* 9.0  MG  --   --  1.5* 1.9  --   PHOS  --   --  3.7 3.7  --    GFR: Estimated Creatinine Clearance: 32.7 mL/min (by C-G formula based on SCr of 0.94 mg/dL). Liver Function Tests:  Recent Labs  Lab 12/24/23 1032 12/26/23 0756 12/27/23 0512  AST 19  --   --   ALT 13  --   --   ALKPHOS 71  --   --  BILITOT 0.8  --   --   PROT 6.4*  --   --   ALBUMIN 3.7 3.6 3.3*   Recent Labs  Lab 12/24/23 1032  LIPASE 36   No results for input(s): "AMMONIA" in the last 168 hours. No results for input(s): "TSH", "T4TOTAL", "FREET4", "T3FREE", "THYROIDAB" in the last 72 hours.  Sepsis Labs: No results for input(s): "PROCALCITON", "LATICACIDVEN" in the last 168 hours. No results found for this or any previous visit (from the past 240 hours).  Antimicrobials/Microbiology: Anti-infectives (From admission, onward)    Start     Dose/Rate Route Frequency Ordered Stop   12/27/23 1600  erythromycin (E-MYCIN) tablet 250 mg        250 mg Oral 3 times daily 12/27/23 1309           Component Value Date/Time   SDES  08/05/2023 0946    URINE, RANDOM Performed at [facility], [address] [place], [place] [address]    [facility]  08/05/2023 0946    NONE Reflexed from [id number] Performed at [facility], [address] [place], [place] [address]    CULT (A) 08/05/2023 0946    30,000 COLONIES/mL MULTIPLE SPECIES PRESENT, SUGGEST RECOLLECTION   REPTSTATUS 08/06/2023 FINAL 08/05/2023 1478     Radiology Studies: No results found.   LOS: 2 days   Total time spent in review of labs and imaging, patient evaluation, formulation of plan, documentation and communication with family: 35 minutes  Lanae Boast, MD  [facility]  12/28/2023, 11:49 AM

## 2023-12-29 ENCOUNTER — Telehealth: Payer: Self-pay | Admitting: Emergency Medicine

## 2023-12-29 DIAGNOSIS — I10 Essential (primary) hypertension: Secondary | ICD-10-CM

## 2023-12-29 DIAGNOSIS — G500 Trigeminal neuralgia: Secondary | ICD-10-CM

## 2023-12-29 DIAGNOSIS — Z86711 Personal history of pulmonary embolism: Secondary | ICD-10-CM

## 2023-12-29 DIAGNOSIS — E222 Syndrome of inappropriate secretion of antidiuretic hormone: Secondary | ICD-10-CM

## 2023-12-29 DIAGNOSIS — N281 Cyst of kidney, acquired: Secondary | ICD-10-CM

## 2023-12-29 DIAGNOSIS — B9620 Unspecified Escherichia coli [E. coli] as the cause of diseases classified elsewhere: Secondary | ICD-10-CM | POA: Insufficient documentation

## 2023-12-29 DIAGNOSIS — C3490 Malignant neoplasm of unspecified part of unspecified bronchus or lung: Secondary | ICD-10-CM

## 2023-12-29 DIAGNOSIS — R110 Nausea: Secondary | ICD-10-CM | POA: Diagnosis not present

## 2023-12-29 DIAGNOSIS — N390 Urinary tract infection, site not specified: Secondary | ICD-10-CM

## 2023-12-29 LAB — CBC
HCT: 31.6 % — ABNORMAL LOW (ref 36.0–46.0)
Hemoglobin: 10.5 g/dL — ABNORMAL LOW (ref 12.0–15.0)
MCH: 31.5 pg (ref 26.0–34.0)
MCHC: 33.2 g/dL (ref 30.0–36.0)
MCV: 94.9 fL (ref 80.0–100.0)
Platelets: 219 10*3/uL (ref 150–400)
RBC: 3.33 MIL/uL — ABNORMAL LOW (ref 3.87–5.11)
RDW: 13.1 % (ref 11.5–15.5)
WBC: 8.4 10*3/uL (ref 4.0–10.5)
nRBC: 0 % (ref 0.0–0.2)

## 2023-12-29 LAB — BASIC METABOLIC PANEL
Anion gap: 8 (ref 5–15)
BUN: 14 mg/dL (ref 8–23)
CO2: 22 mmol/L (ref 22–32)
Calcium: 8.5 mg/dL — ABNORMAL LOW (ref 8.9–10.3)
Chloride: 97 mmol/L — ABNORMAL LOW (ref 98–111)
Creatinine, Ser: 1.07 mg/dL — ABNORMAL HIGH (ref 0.44–1.00)
GFR, Estimated: 50 mL/min — ABNORMAL LOW (ref >60)
Glucose, Bld: 95 mg/dL (ref 70–99)
Potassium: 3.8 mmol/L (ref 3.5–5.1)
Sodium: 127 mmol/L — ABNORMAL LOW (ref 135–145)

## 2023-12-29 MED ORDER — SODIUM CHLORIDE 0.9 % IV SOLN
1.0000 g | INTRAVENOUS | Status: DC
  Administered 2023-12-29: 1 g via INTRAVENOUS
  Filled 2023-12-29 (×2): qty 10

## 2023-12-29 MED ORDER — PHENAZOPYRIDINE HCL 100 MG PO TABS: 100.0000 mg | ORAL_TABLET | Freq: Two times a day (BID) | ORAL | Status: DC | PRN

## 2023-12-29 NOTE — Progress Notes (Signed)
 PROGRESS NOTE   Kristen [name]  WJX:[id number] DOB: 08/19/1933 DOA: 12/24/2023 PCP: Emilio Aspen, MD   Date of Service: the patient was seen and examined on 12/29/2023  Brief Narrative:  89 year old F with PMH of COPD, lung nodule concerning for adenocarcinoma, PET scan on 11/26/2023 concerning for adenocarcinoma in RUL, right renal lesion and hypermetabolic activity in sigmoid colon-followed by gi pccm, chronic pain on p.o. Dilaudid, PE on Eliquis, hyponatremia, HTN, HLD, anxiety and depression presented with chronic nausea with vomiting, poor p.o. intake and unintentional weight loss AND admitted for nausea along with hyponatremia for further workup of abnormal PET scan  Procedure: none  Consultation: Gastroenterology   Assessment & Plan Intractable nausea Persisting symptoms GI following, patient undergo EGD tomorrow Continuing PPI, sucralfate per GI recommendations Continue erythromycin per GI recommendations N.p.o. at midnight for procedure tomorrow Urinalysis suggestive of urinary tract infection with E. coli growing from culture, initiating intravenous ceftriaxone SIADH (syndrome of inappropriate ADH production) (HCC) Likely secondary to underlying pulmonary malignancy Conservative management with moderate fluid restriction If worsening will consider tightening fluid restriction or adding salt tablets Monitoring sodium levels with serial chemistries E-coli UTI Urinalysis suggestive of urinary tract infection with significant pyuria Initiating intravenous ceftriaxone Culture growing out E. coli, await final sensitivities Possible association with nausea? Adenocarcinoma of lung (HCC) Identified in the outpatient setting by patient's pulmonologist Dr. Solon Augusta According to discussion with patient/daughter, they are likely to pursue conservative management Outpatient follow-up with pulmonology Renal cyst, right Outpatient follow-up Essential hypertension Continue  amlodipine Hypomagnesemia Status post replacement History of pulmonary embolism Remote history Of early holding Eliquis in preparation for EGD Trigeminal neuralgia Patient is on a surprising regimen of opiate therapy for known history of trigeminal neuralgia While this could potentially be contributing to her nausea, it is unlikely considering that she has been on this regimen for a number of years     Subjective:  Patient complaining of moderate to severe nausea.  Symptoms are waxing and waning, seemingly the worst after eating meals.  Patient denies associated vomiting and also complains of intermittent epigastric abdominal pain.  Physical Exam:  Vitals:   12/28/23 0718 12/28/23 1356 12/28/23 2133 12/29/23 0559  BP: (!) 143/64 107/76 (!) 140/45 (!) 135/51  Pulse: 70 67 (!) 56 63  Resp: 18 18 17 17   Temp:  97.8 F (36.6 C) 97.7 F (36.5 C) 98.2 F (36.8 C)  TempSrc:  Oral Oral Oral  SpO2:  100% 99% 100%  Weight:      Height:        Constitutional: Awake alert and oriented x3, in some distress due to abdominal pain and nausea. Skin: no rashes, no lesions, poor skin turgor noted. Eyes: Pupils are equally reactive to light.  No evidence of scleral icterus or conjunctival pallor.  ENMT: Moist mucous membranes noted.  Posterior pharynx clear of any exudate or lesions.   Respiratory: clear to auscultation bilaterally, no wheezing, no crackles. Normal respiratory effort. No accessory muscle use.  Cardiovascular: Regular rate and rhythm, no murmurs / rubs / gallops. No extremity edema. 2+ pedal pulses. No carotid bruits.  Abdomen: Epigastric tenderness, abdomen is soft.  No evidence of intra-abdominal masses.  Positive bowel sounds noted in all quadrants.   Musculoskeletal: No joint deformity upper and lower extremities. Good ROM, no contractures. Normal muscle tone.    Data Reviewed:  I have personally reviewed and interpreted labs, imaging.  Significant findings are    CBC: Recent Labs  Lab 12/24/23 1032 12/25/23  6295 12/26/23 0756 12/27/23 0512 12/28/23 0528 12/29/23 0512  WBC 7.4 5.8 6.5 8.8 9.9 8.4  NEUTROABS 4.9  --   --   --   --   --   HGB 11.1* 10.8* 11.4* 10.2* 11.7* 10.5*  HCT 32.2* 32.7* 33.4* 29.7* 35.4* 31.6*  MCV 93.9 96.5 93.6 94.0 96.7 94.9  PLT 212 197 223 202 226 219   Basic Metabolic Panel: Recent Labs  Lab 12/25/23 0511 12/26/23 0756 12/27/23 0512 12/28/23 0528 12/29/23 0512  NA 130* 131* 129* 128* 127*  K 3.8 4.5 3.7 4.3 3.8  CL 98 98 96* 95* 97*  CO2 25 23 25 23 22   GLUCOSE 94 92 84 91 95  BUN 16 14 12 16 14   CREATININE 0.96 0.80 0.83 0.94 1.07*  CALCIUM 8.8* 8.9 8.7* 9.0 8.5*  MG  --  1.5* 1.9  --   --   PHOS  --  3.7 3.7  --   --    GFR: Estimated Creatinine Clearance: 28.7 mL/min (A) (by C-G formula based on SCr of 1.07 mg/dL (H)). Liver Function Tests: Recent Labs  Lab 12/24/23 1032 12/26/23 0756 12/27/23 0512  AST 19  --   --   ALT 13  --   --   ALKPHOS 71  --   --   BILITOT 0.8  --   --   PROT 6.4*  --   --   ALBUMIN 3.7 3.6 3.3*     Code Status:  DNR.  Code status decision has been confirmed with: patient Family Communication: Daughter is at the bedside who has been updated on plan of care   Severity of Illness:  The appropriate patient status for this patient is INPATIENT. Inpatient status is judged to be reasonable and necessary in order to provide the required intensity of service to ensure the patient's safety. The patient's presenting symptoms, physical exam findings, and initial radiographic and laboratory data in the context of their chronic comorbidities is felt to place them at high risk for further clinical deterioration. Furthermore, it is not anticipated that the patient will be medically stable for discharge from the hospital within 2 midnights of admission.   * I certify that at the point of admission it is my clinical judgment that the patient will require inpatient hospital  care spanning beyond 2 midnights from the point of admission due to high intensity of service, high risk for further deterioration and high frequency of surveillance required.*  Time spent:  52 minutes  Author:  Marinda Elk MD  12/29/2023 9:03 AM

## 2023-12-29 NOTE — Progress Notes (Signed)
 [facility] Progress Note  SUBJECTIVE:   Interval history: Kristen [name] was seen and evaluated today at bedside.  Resting comfortably in bed.  Noted that nausea has not improved.  She is tolerating oral intake despite nausea.  She noted having bowel movement today.  She has headache pain.  No chest pain or shortness of breath.  She is agreeable to completing EGD tomorrow as Eliquis has now been held.  Past Medical History:  Diagnosis Date   Anemia    Arthritis    B12 deficiency    Chronic kidney disease    Chronic pain disorder    trigeminal neuralgia   Depression    Dyslipidemia    Hypertension    PONV (postoperative nausea and vomiting)    Seizure disorder (HCC)    decades since last seizure   Tremor    Trigeminal nerve disorder    Past Surgical History:  Procedure Laterality Date   ABDOMINAL HYSTERECTOMY     BLADDER REPAIR     tack   CATARACT EXTRACTION     CHOLECYSTECTOMY     gamma knife  2004   NOSE SURGERY     for fracture   SHOULDER ARTHROSCOPY DISTAL CLAVICLE EXCISION AND OPEN ROTATOR CUFF REPAIR Left    rotary cuff repair   TONSILLECTOMY     TOTAL HIP ARTHROPLASTY Right 10/30/2023   Procedure: TOTAL HIP ARTHROPLASTY ANTERIOR APPROACH;  Surgeon: Durene Romans, MD;  Location: [facility] OR;  Service: Orthopedics;  Laterality: Right;   TOTAL KNEE ARTHROPLASTY Bilateral    UMBILICAL HERNIA REPAIR     Current Facility-Administered Medications  Medication Dose Route Frequency Provider Last Rate Last Admin   acetaminophen (TYLENOL) tablet 650 mg  650 mg Oral Q6H PRN Kirby Crigler, Mir M, MD   650 mg at 12/28/23 1434   Or   acetaminophen (TYLENOL) suppository 650 mg  650 mg Rectal Q6H PRN Kirby Crigler, Mir M, MD       albuterol (PROVENTIL) (2.5 MG/3ML) 0.083% nebulizer solution 2.5 mg  2.5 mg Nebulization Q2H PRN Kirby Crigler, Mir M, MD       amLODipine (NORVASC) tablet 10 mg  10 mg Oral Daily Candelaria Stagers T, MD   10 mg at 12/29/23 1044   cyanocobalamin (VITAMIN B12)  injection 100 mcg  100 mcg Intramuscular Q30 days Kc, Dayna Barker, MD       diclofenac Sodium (VOLTAREN) 1 % topical gel 4 g  4 g Topical QID PRN Kc, Ramesh, MD       erythromycin (E-MYCIN) tablet 500 mg  500 mg Oral TID Liliane Shi H, DO   500 mg at 12/29/23 1043   famotidine (PEPCID) tablet 20 mg  20 mg Oral QHS Liliane Shi H, DO   20 mg at 12/28/23 2056   HYDROmorphone (DILAUDID) tablet 2 mg  2 mg Oral TID PRN Maryln Gottron, MD   2 mg at 12/28/23 2057   hydrOXYzine (ATARAX) tablet 25 mg  25 mg Oral QHS Kirby Crigler, Mir M, MD   25 mg at 12/28/23 2056   ondansetron (ZOFRAN) tablet 4 mg  4 mg Oral Q6H PRN Kirby Crigler, Mir M, MD   4 mg at 12/29/23 1043   Or   ondansetron (ZOFRAN) injection 4 mg  4 mg Intravenous Q6H PRN Kirby Crigler, Mir M, MD       pantoprazole (PROTONIX) EC tablet 40 mg  40 mg Oral Daily Kirby Crigler, Mir M, MD   40 mg at 12/29/23 1044   saccharomyces boulardii (FLORASTOR) capsule 250 mg  250 mg Oral BID Lynann Bologna, DO   250 mg at 12/29/23 1044   sucralfate (CARAFATE) 1 GM/10ML suspension 1 g  1 g Oral TID WC & HS Liliane Shi H, DO   1 g at 12/29/23 0900   traZODone (DESYREL) tablet 25 mg  25 mg Oral QHS PRN Maryln Gottron, MD   25 mg at 12/28/23 2056   Allergies as of 12/24/2023 - Review Complete 12/24/2023  Allergen Reaction Noted   Penicillins Hives and Rash 07/18/2013   Tetracyclines & related Anaphylaxis 07/18/2013   Sulfa antibiotics Hives and Other (See Comments) 04/23/2015   Review of Systems:  Review of Systems  Respiratory:  Negative for shortness of breath.   Cardiovascular:  Negative for chest pain.  Gastrointestinal:  Positive for nausea. Negative for abdominal pain, blood in stool, constipation, diarrhea and vomiting.    OBJECTIVE:   Temp:  [97.7 F (36.5 C)-98.2 F (36.8 C)] 98.2 F (36.8 C) (03/05 0559) Pulse Rate:  [56-67] 63 (03/05 0559) Resp:  [17-18] 17 (03/05 0559) BP: (107-140)/(45-76) 135/51 (03/05 0559) SpO2:  [99 %-100  %] 100 % (03/05 0559) Last BM Date : 12/25/23 Physical Exam Constitutional:      General: She is not in acute distress.    Appearance: She is not ill-appearing, toxic-appearing or diaphoretic.  Cardiovascular:     Rate and Rhythm: Normal rate and regular rhythm.  Pulmonary:     Effort: No respiratory distress.     Breath sounds: Normal breath sounds.  Abdominal:     General: Bowel sounds are normal. There is no distension.     Palpations: Abdomen is soft.     Tenderness: There is no abdominal tenderness. There is no guarding.  Neurological:     Mental Status: She is alert.     Labs: Recent Labs    12/27/23 0512 12/28/23 0528 12/29/23 0512  WBC 8.8 9.9 8.4  HGB 10.2* 11.7* 10.5*  HCT 29.7* 35.4* 31.6*  PLT 202 226 219   BMET Recent Labs    12/27/23 0512 12/28/23 0528 12/29/23 0512  NA 129* 128* 127*  K 3.7 4.3 3.8  CL 96* 95* 97*  CO2 25 23 22   GLUCOSE 84 91 95  BUN 12 16 14   CREATININE 0.83 0.94 1.07*  CALCIUM 8.7* 9.0 8.5*   LFT Recent Labs    12/27/23 0512  ALBUMIN 3.3*   PT/INR No results for input(s): "LABPROT", "INR" in the last 72 hours. Diagnostic imaging: No results found.  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 at  midnight for procedure tomorrow   LOS: 3 days   Liliane Shi, [facility]

## 2023-12-29 NOTE — Anesthesia Preprocedure Evaluation (Signed)
 Anesthesia Evaluation  Patient identified by MRN, date of birth, ID band Patient awake    Reviewed: Allergy & Precautions, NPO status , Patient's Chart, lab work & pertinent test results  History of Anesthesia Complications (+) PONV and history of anesthetic complications  Airway Mallampati: II  TM Distance: >3 FB Neck ROM: Full    Dental  (+) Teeth Intact, Dental Advisory Given   Pulmonary neg pulmonary ROS   Pulmonary exam normal breath sounds clear to auscultation       Cardiovascular hypertension, Pt. on medications Normal cardiovascular exam+ Valvular Problems/Murmurs (mild-mod MR, mild-mod TR) MR  Rhythm:Regular Rate:Normal  Echo 2023 1. Left ventricular ejection fraction, by estimation, is 60 to 65%. The  left ventricle has normal function. The left ventricle has no regional  wall motion abnormalities. There is mild concentric left ventricular  hypertrophy.   2. Right ventricular systolic function is normal. The right ventricular  size is normal. Tricuspid regurgitation signal is inadequate for assessing  PA pressure.   3. The mitral valve is normal in structure. No evidence of mitral valve  regurgitation. No evidence of mitral stenosis.   4. Tricuspid valve regurgitation is mild to moderate.   5. The aortic valve is tricuspid. Aortic valve regurgitation is mild to  moderate. Aortic valve sclerosis/calcification is present, without any  evidence of aortic stenosis. Aortic regurgitation PHT measures 564 msec.  Aortic valve area, by VTI measures  1.51 cm. Aortic valve mean gradient measures 7.0 mmHg. Aortic valve Vmax  measures 1.66 m/s.   6. The inferior vena cava is normal in size with greater than 50%  respiratory variability, suggesting right atrial pressure of 3 mmHg.      Neuro/Psych Seizures -, Well Controlled,  PSYCHIATRIC DISORDERS  Depression       GI/Hepatic negative GI ROS, Neg liver ROS,,,  Endo/Other   negative endocrine ROS    Renal/GU negative Renal ROS  negative genitourinary   Musculoskeletal  (+) Arthritis , Osteoarthritis,  Chronic pain    Abdominal   Peds  Hematology  (+) Blood dyscrasia, anemia Hb 10.5, plt 219   Anesthesia Other Findings eliquis  Reproductive/Obstetrics negative OB ROS                             Anesthesia Physical Anesthesia Plan  ASA: 3  Anesthesia Plan: MAC   Post-op Pain Management:    Induction:   PONV Risk Score and Plan: 2 and Propofol infusion and TIVA  Airway Management Planned: Natural Airway and Simple Face Mask  Additional Equipment: None  Intra-op Plan:   Post-operative Plan:   Informed Consent: I have reviewed the patients History and Physical, chart, labs and discussed the procedure including the risks, benefits and alternatives for the proposed anesthesia with the patient or authorized representative who has indicated his/her understanding and acceptance.   Patient has DNR.  Discussed DNR with patient and Suspend DNR.     Plan Discussed with: CRNA  Anesthesia Plan Comments: (Difficult PIV )       Anesthesia Quick Evaluation

## 2023-12-29 NOTE — Assessment & Plan Note (Signed)
 Continue amlodipine

## 2023-12-29 NOTE — Plan of Care (Signed)
   Problem: Safety: Goal: Ability to remain free from injury will improve Outcome: Progressing

## 2023-12-29 NOTE — Assessment & Plan Note (Signed)
Status post replacement. 

## 2023-12-29 NOTE — Assessment & Plan Note (Signed)
 Outpatient follow up.

## 2023-12-29 NOTE — Evaluation (Signed)
 Occupational Therapy Evaluation Patient Details Name: Kristen [name] MRN: [id number] DOB: 28-Dec-1932 Today's Date: 12/29/2023   History of Present Illness   Pt is a 89 year old F presented 12/24/23 with chronic nausea with vomiting, poor p.o. 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 Dilaudid, PE on Eliquis, hyponatremia, HTN, HLD, anxiety and depression, hip fx s/p R DA THA 10/30/23     Clinical Impressions Pt at this time presented in bed and agreeable to work with this reporting therapist. She completed hair care with sitting to standing with no LOB and interment no BUE support without LOB and BLE dressing with mod I. Pt completed room level ambulation with rw but declined to ambulate in the halls at this time as waiting for breakfast to come and reported having no other needs at this time. At this time Acute Occupational Therapy signing off. Thank you.      If plan is discharge home, recommend the following:   Assist for transportation     Functional Status Assessment   Patient has had a recent decline in their functional status and demonstrates the ability to make significant improvements in function in a reasonable and predictable amount of time.     Equipment Recommendations   None recommended by OT     Recommendations for Other Services         Precautions/Restrictions   Precautions Precautions: Fall Restrictions Weight Bearing Restrictions Per Provider Order: No     Mobility Bed Mobility Overal bed mobility: Independent                  Transfers Overall transfer level: Needs assistance Equipment used: Rolling walker (2 wheels) Transfers: Sit  to/from Stand Sit to Stand: Modified independent (Device/Increase time)           General transfer comment: no assist      Balance Overall balance assessment: History of Falls, Needs assistance Sitting-balance support: No upper extremity supported, Feet supported Sitting balance-Leahy Scale: Good     Standing balance support: During functional activity Standing balance-Leahy Scale: Fair Standing balance comment: requires walker for mobility but was able to complete hair care in standing position and donning gown                           ADL either performed or assessed with clinical judgement   ADL Overall ADL's : Needs assistance/impaired Eating/Feeding: Independent;Sitting   Grooming: Wash/dry hands;Wash/dry face;Supervision/safety;Standing   Upper Body Bathing: Modified independent;Sitting   Lower Body Bathing: Modified independent;Sit to/from stand;Sitting/lateral leans   Upper Body Dressing : Modified independent;Sitting   Lower Body Dressing: Modified independent;Sit to/from stand;Sitting/lateral leans   Toilet Transfer: Supervision/safety;Cueing for safety;Cueing for sequencing;Rolling walker (2 wheels)   Toileting- Clothing Manipulation and Hygiene: Supervision/safety;Cueing for safety;Cueing for sequencing;Sit to/from stand;Sitting/lateral lean   Tub/ Shower Transfer: Supervision/safety;Cueing for sequencing;Cueing for safety;Rolling walker (2 wheels);Grab bars   Functional mobility during ADLs: Supervision/safety;Rolling walker (2 wheels);Contact guard assist General ADL Comments: When attempting to go around the bed they were trying to lift up walker and cues on saftey on how to get through narrow areas     Vision Baseline Vision/History: 1 Wears glasses Ability to See in Adequate Light:  0 Adequate Patient Visual Report: No change from baseline Vision Assessment?: No apparent visual deficits     Perception Perception: Within Functional  Limits       Praxis Praxis: WFL       Pertinent Vitals/Pain Pain Assessment Pain Assessment: No/denies pain     Extremity/Trunk Assessment Upper Extremity Assessment Upper Extremity Assessment: Overall WFL for tasks assessed (Per pt reproted they are not suppose to use theraband at this time)   Lower Extremity Assessment Lower Extremity Assessment: Defer to PT evaluation   Cervical / Trunk Assessment Cervical / Trunk Assessment: Normal   Communication Communication Communication: No apparent difficulties   Cognition Arousal: Alert Behavior During Therapy: WFL for tasks assessed/performed Cognition: No apparent impairments                               Following commands: Intact       Cueing  General Comments   Cueing Techniques: Verbal cues      Exercises     Shoulder Instructions      Home Living Family/patient expects to be discharged to:: Private residence Living Arrangements: Alone Available Help at Discharge: Family;Available PRN/intermittently Type of Home: Apartment Home Access: Level entry     Home Layout: One level     Bathroom Shower/Tub: Producer, television/film/video: Handicapped height Bathroom Accessibility: Yes   Home Equipment: Grab bars - tub/shower;Grab bars - toilet;Shower seat;Rollator (4 wheels);Cane - single point;Hand held shower head   Additional Comments: Apartment at [facility] [employer])      Prior Functioning/Environment Prior Level of Function : Independent/Modified Independent             Mobility Comments: Walking with rollator vs cane, walks to/from dining hall, takes meals back to her room; ADLs Comments: manages ADLs, breakfast in her apt; manages meds and fiances    OT Problem List: Decreased strength;Decreased activity tolerance;Impaired balance (sitting and/or standing);Decreased safety awareness;Decreased knowledge of use of DME or AE   OT Treatment/Interventions:        OT  Goals(Current goals can be found in the care plan section)   Acute Rehab OT Goals Patient Stated Goal: to go home OT Goal Formulation: With patient Time For Goal Achievement: 01/12/24 Potential to Achieve Goals: Good   OT Frequency:       Co-evaluation              AM-PAC OT "6 Clicks" Daily Activity     Outcome Measure Help from another person eating meals?: None Help from another person taking care of personal grooming?: None Help from another person toileting, which includes using toliet, bedpan, or urinal?: None Help from another person bathing (including washing, rinsing, drying)?: A Little Help from another person to put on and taking off regular upper body clothing?: None Help from another person to put on and taking off regular lower body clothing?: None 6 Click Score: 23   End of Session Equipment Utilized During Treatment: Rolling walker (2 wheels) Nurse Communication: Mobility status  Activity Tolerance: Patient tolerated treatment well Patient left: in chair (Pt reproted fine without call bell beside them. nurse also was made aware)  OT Visit Diagnosis: Unsteadiness on feet (R26.81);Other abnormalities of gait and mobility (R26.89);Muscle weakness (generalized) (M62.81)                Time: 7846-9629 OT Time Calculation (min): 21 min Charges:  OT General Charges $OT Visit: 1  Visit OT Evaluation $OT Eval Low Complexity: 1 Low  Presley Raddle OTR/L  Acute Rehab Services  [phone] office number   Alphia Moh 12/29/2023, 8:04 AM

## 2023-12-29 NOTE — Telephone Encounter (Signed)
 Darl Pikes daughter would like to know what the follow up appointment is for. Patient scheduled 01/13/2024 with Dr. Delton Coombes. Patient was seen by Urologist. Darl Pikes phone number is [phone].

## 2023-12-29 NOTE — Telephone Encounter (Signed)
 I think we can cancel that appointment

## 2023-12-29 NOTE — Assessment & Plan Note (Signed)
 Identified in the outpatient setting by patient's pulmonologist Dr. Solon Augusta According to discussion with patient/daughter, they are likely to pursue conservative management Outpatient follow-up with pulmonology

## 2023-12-29 NOTE — Assessment & Plan Note (Signed)
 Remote history Of early holding Eliquis in preparation for EGD

## 2023-12-29 NOTE — Telephone Encounter (Signed)
 ATC daughter x1. Lmtcb (DPR)

## 2023-12-29 NOTE — Telephone Encounter (Signed)
 Darl Pikes daughter would like to know what the follow up appointment is for. Patient scheduled 01/13/2024 with Dr. Delton Coombes.  Darl Pikes phone number is [phone]. Please try again.

## 2023-12-29 NOTE — Telephone Encounter (Signed)
 Called and spoke with patient's daughter, Kristen [name] ([place]), she wanted to know what the visit on 01/05/24 was for as they had already discussed the results of the PET scan with him and she spent all last week in doctor's offices following up with urology and GI. She is currently hospitalized.  I advised her I would send a message to Dr. Delton Coombes and see if she needs to keep this appointment and we will call her back once we hear back from him.  She verbalized understanding.  Dr. Delton Coombes, Does [name] need to keep her 3/12 office visit since the results of the PET scan have already been reviewed with the patient's daughter?  Please advise.  Thank you.

## 2023-12-29 NOTE — Assessment & Plan Note (Signed)
 Likely secondary to underlying pulmonary malignancy Conservative management with moderate fluid restriction If worsening will consider tightening fluid restriction or adding salt tablets Monitoring sodium levels with serial chemistries

## 2023-12-29 NOTE — Assessment & Plan Note (Signed)
 Patient is on a surprising regimen of opiate therapy for known history of trigeminal neuralgia While this could potentially be contributing to her nausea, it is unlikely considering that she has been on this regimen for a number of years

## 2023-12-29 NOTE — Assessment & Plan Note (Signed)
 Persisting symptoms GI following, patient undergo EGD tomorrow Continuing PPI, sucralfate per GI recommendations Continue erythromycin per GI recommendations N.p.o. at midnight for procedure tomorrow Urinalysis suggestive of urinary tract infection with E. coli growing from culture, initiating intravenous ceftriaxone

## 2023-12-29 NOTE — Assessment & Plan Note (Signed)
 Urinalysis suggestive of urinary tract infection with significant pyuria Initiating intravenous ceftriaxone Culture growing out E. coli, await final sensitivities Possible association with nausea?

## 2023-12-29 NOTE — H&P (View-Only) (Signed)
 [facility] Progress Note  SUBJECTIVE:   Interval history: Kristen [name] was seen and evaluated today at bedside.  Resting comfortably in bed.  Noted that nausea has not improved.  She is tolerating oral intake despite nausea.  She noted having bowel movement today.  She has headache pain.  No chest pain or shortness of breath.  She is agreeable to completing EGD tomorrow as Eliquis has now been held.  Past Medical History:  Diagnosis Date   Anemia    Arthritis    B12 deficiency    Chronic kidney disease    Chronic pain disorder    trigeminal neuralgia   Depression    Dyslipidemia    Hypertension    PONV (postoperative nausea and vomiting)    Seizure disorder (HCC)    decades since last seizure   Tremor    Trigeminal nerve disorder    Past Surgical History:  Procedure Laterality Date   ABDOMINAL HYSTERECTOMY     BLADDER REPAIR     tack   CATARACT EXTRACTION     CHOLECYSTECTOMY     gamma knife  2004   NOSE SURGERY     for fracture   SHOULDER ARTHROSCOPY DISTAL CLAVICLE EXCISION AND OPEN ROTATOR CUFF REPAIR Left    rotary cuff repair   TONSILLECTOMY     TOTAL HIP ARTHROPLASTY Right 10/30/2023   Procedure: TOTAL HIP ARTHROPLASTY ANTERIOR APPROACH;  Surgeon: Durene Romans, MD;  Location: [facility] OR;  Service: Orthopedics;  Laterality: Right;   TOTAL KNEE ARTHROPLASTY Bilateral    UMBILICAL HERNIA REPAIR     Current Facility-Administered Medications  Medication Dose Route Frequency Provider Last Rate Last Admin   acetaminophen (TYLENOL) tablet 650 mg  650 mg Oral Q6H PRN Kirby Crigler, Mir M, MD   650 mg at 12/28/23 1434   Or   acetaminophen (TYLENOL) suppository 650 mg  650 mg Rectal Q6H PRN Kirby Crigler, Mir M, MD       albuterol (PROVENTIL) (2.5 MG/3ML) 0.083% nebulizer solution 2.5 mg  2.5 mg Nebulization Q2H PRN Kirby Crigler, Mir M, MD       amLODipine (NORVASC) tablet 10 mg  10 mg Oral Daily Candelaria Stagers T, MD   10 mg at 12/29/23 1044   cyanocobalamin (VITAMIN B12)  injection 100 mcg  100 mcg Intramuscular Q30 days Kc, Dayna Barker, MD       diclofenac Sodium (VOLTAREN) 1 % topical gel 4 g  4 g Topical QID PRN Kc, Ramesh, MD       erythromycin (E-MYCIN) tablet 500 mg  500 mg Oral TID Liliane Shi H, DO   500 mg at 12/29/23 1043   famotidine (PEPCID) tablet 20 mg  20 mg Oral QHS Liliane Shi H, DO   20 mg at 12/28/23 2056   HYDROmorphone (DILAUDID) tablet 2 mg  2 mg Oral TID PRN Maryln Gottron, MD   2 mg at 12/28/23 2057   hydrOXYzine (ATARAX) tablet 25 mg  25 mg Oral QHS Kirby Crigler, Mir M, MD   25 mg at 12/28/23 2056   ondansetron (ZOFRAN) tablet 4 mg  4 mg Oral Q6H PRN Kirby Crigler, Mir M, MD   4 mg at 12/29/23 1043   Or   ondansetron (ZOFRAN) injection 4 mg  4 mg Intravenous Q6H PRN Kirby Crigler, Mir M, MD       pantoprazole (PROTONIX) EC tablet 40 mg  40 mg Oral Daily Kirby Crigler, Mir M, MD   40 mg at 12/29/23 1044   saccharomyces boulardii (FLORASTOR) capsule 250 mg  250 mg Oral BID Lynann Bologna, DO   250 mg at 12/29/23 1044   sucralfate (CARAFATE) 1 GM/10ML suspension 1 g  1 g Oral TID WC & HS Liliane Shi H, DO   1 g at 12/29/23 0900   traZODone (DESYREL) tablet 25 mg  25 mg Oral QHS PRN Maryln Gottron, MD   25 mg at 12/28/23 2056   Allergies as of 12/24/2023 - Review Complete 12/24/2023  Allergen Reaction Noted   Penicillins Hives and Rash 07/18/2013   Tetracyclines & related Anaphylaxis 07/18/2013   Sulfa antibiotics Hives and Other (See Comments) 04/23/2015   Review of Systems:  Review of Systems  Respiratory:  Negative for shortness of breath.   Cardiovascular:  Negative for chest pain.  Gastrointestinal:  Positive for nausea. Negative for abdominal pain, blood in stool, constipation, diarrhea and vomiting.    OBJECTIVE:   Temp:  [97.7 F (36.5 C)-98.2 F (36.8 C)] 98.2 F (36.8 C) (03/05 0559) Pulse Rate:  [56-67] 63 (03/05 0559) Resp:  [17-18] 17 (03/05 0559) BP: (107-140)/(45-76) 135/51 (03/05 0559) SpO2:  [99 %-100  %] 100 % (03/05 0559) Last BM Date : 12/25/23 Physical Exam Constitutional:      General: She is not in acute distress.    Appearance: She is not ill-appearing, toxic-appearing or diaphoretic.  Cardiovascular:     Rate and Rhythm: Normal rate and regular rhythm.  Pulmonary:     Effort: No respiratory distress.     Breath sounds: Normal breath sounds.  Abdominal:     General: Bowel sounds are normal. There is no distension.     Palpations: Abdomen is soft.     Tenderness: There is no abdominal tenderness. There is no guarding.  Neurological:     Mental Status: She is alert.     Labs: Recent Labs    12/27/23 0512 12/28/23 0528 12/29/23 0512  WBC 8.8 9.9 8.4  HGB 10.2* 11.7* 10.5*  HCT 29.7* 35.4* 31.6*  PLT 202 226 219   BMET Recent Labs    12/27/23 0512 12/28/23 0528 12/29/23 0512  NA 129* 128* 127*  K 3.7 4.3 3.8  CL 96* 95* 97*  CO2 25 23 22   GLUCOSE 84 91 95  BUN 12 16 14   CREATININE 0.83 0.94 1.07*  CALCIUM 8.7* 9.0 8.5*   LFT Recent Labs    12/27/23 0512  ALBUMIN 3.3*   PT/INR No results for input(s): "LABPROT", "INR" in the last 72 hours. Diagnostic imaging: No results found.  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 at  midnight for procedure tomorrow   LOS: 3 days   Liliane Shi, [facility]

## 2023-12-30 ENCOUNTER — Inpatient Hospital Stay (HOSPITAL_COMMUNITY): Payer: Self-pay | Admitting: Anesthesiology

## 2023-12-30 ENCOUNTER — Encounter (HOSPITAL_COMMUNITY)
Admission: EM | Disposition: A | Discharge status: Home / Self Care | Payer: Self-pay | Source: Home / Self Care | Attending: Internal Medicine

## 2023-12-30 ENCOUNTER — Encounter (HOSPITAL_COMMUNITY): Payer: Self-pay | Admitting: Student

## 2023-12-30 ENCOUNTER — Other Ambulatory Visit (HOSPITAL_COMMUNITY): Payer: Self-pay

## 2023-12-30 DIAGNOSIS — K3189 Other diseases of stomach and duodenum: Secondary | ICD-10-CM

## 2023-12-30 DIAGNOSIS — K2970 Gastritis, unspecified, without bleeding: Secondary | ICD-10-CM

## 2023-12-30 DIAGNOSIS — K317 Polyp of stomach and duodenum: Secondary | ICD-10-CM

## 2023-12-30 DIAGNOSIS — R110 Nausea: Secondary | ICD-10-CM | POA: Diagnosis not present

## 2023-12-30 DIAGNOSIS — Z86711 Personal history of pulmonary embolism: Secondary | ICD-10-CM | POA: Diagnosis not present

## 2023-12-30 DIAGNOSIS — I10 Essential (primary) hypertension: Secondary | ICD-10-CM | POA: Diagnosis not present

## 2023-12-30 DIAGNOSIS — C3490 Malignant neoplasm of unspecified part of unspecified bronchus or lung: Secondary | ICD-10-CM | POA: Diagnosis not present

## 2023-12-30 HISTORY — PX: ESOPHAGOGASTRODUODENOSCOPY: SHX5428

## 2023-12-30 LAB — CBC
HCT: 34.3 % — ABNORMAL LOW (ref 36.0–46.0)
Hemoglobin: 11.1 g/dL — ABNORMAL LOW (ref 12.0–15.0)
MCH: 31.3 pg (ref 26.0–34.0)
MCHC: 32.4 g/dL (ref 30.0–36.0)
MCV: 96.6 fL (ref 80.0–100.0)
Platelets: 224 10*3/uL (ref 150–400)
RBC: 3.55 MIL/uL — ABNORMAL LOW (ref 3.87–5.11)
RDW: 12.9 % (ref 11.5–15.5)
WBC: 8.8 10*3/uL (ref 4.0–10.5)
nRBC: 0 % (ref 0.0–0.2)

## 2023-12-30 LAB — BASIC METABOLIC PANEL
Anion gap: 12 (ref 5–15)
BUN: 15 mg/dL (ref 8–23)
CO2: 24 mmol/L (ref 22–32)
Calcium: 9 mg/dL (ref 8.9–10.3)
Chloride: 96 mmol/L — ABNORMAL LOW (ref 98–111)
Creatinine, Ser: 0.97 mg/dL (ref 0.44–1.00)
GFR, Estimated: 56 mL/min — ABNORMAL LOW (ref >60)
Glucose, Bld: 92 mg/dL (ref 70–99)
Potassium: 3.9 mmol/L (ref 3.5–5.1)
Sodium: 132 mmol/L — ABNORMAL LOW (ref 135–145)

## 2023-12-30 LAB — MAGNESIUM: Magnesium: 1.7 mg/dL (ref 1.7–2.4)

## 2023-12-30 LAB — CULTURE, OB URINE: Culture: >=100000 — AB

## 2023-12-30 SURGERY — EGD (ESOPHAGOGASTRODUODENOSCOPY)
Anesthesia: Monitor Anesthesia Care

## 2023-12-30 MED ORDER — LIDOCAINE 2% (20 MG/ML) 5 ML SYRINGE
INTRAMUSCULAR | Status: DC | PRN
  Administered 2023-12-30: 50 mg via INTRAVENOUS

## 2023-12-30 MED ORDER — METOCLOPRAMIDE HCL 5 MG PO TABS
5.0000 mg | ORAL_TABLET | Freq: Three times a day (TID) | ORAL | 2 refills | Status: AC
  Filled 2023-12-30: qty 90, 30d supply, fill #0
  Filled 2024-03-02 (×2): qty 90, 30d supply, fill #1

## 2023-12-30 MED ORDER — METOCLOPRAMIDE HCL 5 MG PO TABS
5.0000 mg | ORAL_TABLET | Freq: Three times a day (TID) | ORAL | Status: DC
  Administered 2023-12-30: 5 mg via ORAL
  Filled 2023-12-30 (×2): qty 1

## 2023-12-30 MED ORDER — PROPOFOL 10 MG/ML IV BOLUS
INTRAVENOUS | Status: DC | PRN
Start: 2023-12-30 — End: 2023-12-30
  Administered 2023-12-30: 30 mg via INTRAVENOUS
  Administered 2023-12-30: 20 mg via INTRAVENOUS
  Administered 2023-12-30: 50 mg via INTRAVENOUS

## 2023-12-30 MED ORDER — SODIUM CHLORIDE 0.9 % IV SOLN: INTRAVENOUS | Status: DC

## 2023-12-30 MED ORDER — CEFDINIR 300 MG PO CAPS
300.0000 mg | ORAL_CAPSULE | Freq: Two times a day (BID) | ORAL | 0 refills | Status: AC
  Filled 2023-12-30: qty 8, 4d supply, fill #0

## 2023-12-30 NOTE — Transfer of Care (Signed)
 Immediate Anesthesia Transfer of Care Note  Patient: Kristen [name]  Procedure(s) Performed: EGD (ESOPHAGOGASTRODUODENOSCOPY)  Patient Location: Endoscopy Unit  Anesthesia Type:General  Level of Consciousness: drowsy  Airway & Oxygen Therapy: Patient Spontanous Breathing and Patient connected to face mask oxygen  Post-op Assessment: Report given to RN and Post -op Vital signs reviewed and stable  Post vital signs: Reviewed and stable  Last Vitals:  Vitals Value Taken Time  BP 113/41 12/30/23 1222  Temp    Pulse 66 12/30/23 1222  Resp 17 12/30/23 1224  SpO2 100 % 12/30/23 1222  Vitals shown include unfiled device data.  Last Pain:  Vitals:   12/30/23 1222  TempSrc:   PainSc: Asleep      Patients Stated Pain Goal: 0 (12/28/23 0931)  Complications: No notable events documented.

## 2023-12-30 NOTE — Progress Notes (Signed)
 Discharge instructions reviewed with patient. All questions answered. All belongings accounted for. Patient to follow up with MD in  1-2 weeks. Patient medications hand delivered from outpatient pharmacy. PIV removed.

## 2023-12-30 NOTE — Telephone Encounter (Signed)
 [name], Susan(DPR) is aware below message. 3/12 appt has been canceled.  Nothing further needed.

## 2023-12-30 NOTE — Assessment & Plan Note (Deleted)
 Identified in the outpatient setting by patient's pulmonologist Dr. Solon Augusta According to discussion with patient/daughter, they are likely to pursue conservative management Outpatient follow-up with pulmonology

## 2023-12-30 NOTE — Assessment & Plan Note (Deleted)
 Patient is on a surprising regimen of opiate therapy for known history of trigeminal neuralgia While this could potentially be contributing to her nausea, it is unlikely considering that she has been on this regimen for a number of years

## 2023-12-30 NOTE — Interval H&P Note (Signed)
 History and Physical Interval Note:  12/30/2023 11:06 AM  Kristen [name]  has presented today for surgery, with the diagnosis of Chronic nausea.  The various methods of treatment have been discussed with the patient and family. After consideration of risks, benefits and other options for treatment, the patient has consented to  Procedure(s): EGD (ESOPHAGOGASTRODUODENOSCOPY) (N/A) as a surgical intervention.  The patient's history has been reviewed, patient examined, no change in status, stable for surgery.  I have reviewed the patient's chart and labs.  Questions were answered to the patient's satisfaction.     Lynann Bologna

## 2023-12-30 NOTE — Assessment & Plan Note (Deleted)
 Outpatient follow up.

## 2023-12-30 NOTE — Assessment & Plan Note (Deleted)
 Urinalysis suggestive of urinary tract infection with significant pyuria Initiating intravenous ceftriaxone Culture growing out E. coli, await final sensitivities Possible association with nausea?

## 2023-12-30 NOTE — Assessment & Plan Note (Deleted)
 Remote history Of early holding Eliquis in preparation for EGD

## 2023-12-30 NOTE — Assessment & Plan Note (Deleted)
Status post replacement. 

## 2023-12-30 NOTE — Assessment & Plan Note (Deleted)
 Persisting symptoms GI following, patient undergo EGD tomorrow Continuing PPI, sucralfate per GI recommendations Continue erythromycin per GI recommendations N.p.o. at midnight for procedure tomorrow Urinalysis suggestive of urinary tract infection with E. coli growing from culture, initiating intravenous ceftriaxone

## 2023-12-30 NOTE — Anesthesia Postprocedure Evaluation (Signed)
 Anesthesia Post Note  Patient: Audrinna Sherman  Procedure(s) Performed: EGD (ESOPHAGOGASTRODUODENOSCOPY)     Patient location during evaluation: PACU Anesthesia Type: MAC Level of consciousness: awake and alert Pain management: pain level controlled Vital Signs Assessment: post-procedure vital signs reviewed and stable Respiratory status: spontaneous breathing, nonlabored ventilation and respiratory function stable Cardiovascular status: blood pressure returned to baseline and stable Postop Assessment: no apparent nausea or vomiting Anesthetic complications: no   No notable events documented.  Last Vitals:  Vitals:   12/30/23 1240 12/30/23 1255  BP: (!) 131/41 136/60  Pulse: (!) 56 64  Resp: (!) 25 17  Temp:  (!) 36.4 C  SpO2: 99% 99%    Last Pain:  Vitals:   12/30/23 1255  TempSrc: Oral  PainSc:                  Lannie Fields

## 2023-12-30 NOTE — Assessment & Plan Note (Deleted)
 Likely secondary to underlying pulmonary malignancy Conservative management with moderate fluid restriction If worsening will consider tightening fluid restriction or adding salt tablets Monitoring sodium levels with serial chemistries

## 2023-12-30 NOTE — Assessment & Plan Note (Deleted)
 Continue amlodipine

## 2023-12-30 NOTE — Discharge Instructions (Addendum)
 Please take all prescribed medications exactly as instructed including your remaining course of antibiotics for your urinary tract infection and your new drug, Metoclopramide, to help with your gastric motility and nausea. Please consume a low fat diet.  Please avoid spicy food and alcohol.   Please increase your physical activity as tolerated. Please maintain all outpatient follow-up appointments including follow-up with your primary care provider and Dr. Lorenso Quarry with Gastroenterology. Please return to the emergency department if you develop worsening weakness, vomiting, inability to tolerate oral intake or fevers in excess of 100.4 F.

## 2023-12-30 NOTE — Op Note (Signed)
 [facility] Patient Name: Kristen [name] Procedure Date: 12/30/2023 MRN: [id number] Attending MD: Liliane Shi DO, DO, [id number] Date of Birth: [birth year] CSN: [id number] Age: 89 Admit Type: Inpatient Procedure:                Upper GI endoscopy Indications:              Nausea, Persistent vomiting of unknown cause Providers:                Liliane Shi DO, DO, Norman Clay, RN, Marja Kays, Technician Referring MD:              Medicines:                See the Anesthesia note for documentation of the                            administered medications Complications:            No immediate complications. Estimated Blood Loss:     Estimated blood loss was minimal. Procedure:                Pre-Anesthesia Assessment:                           - ASA Grade Assessment: III - A patient with severe                            systemic disease.                           - The risks and benefits of the procedure and the                            sedation options and risks were discussed with the                            patient. All questions were answered and informed                            consent was obtained.                           After obtaining informed consent, the endoscope was                            passed under direct vision. Throughout the                            procedure, the patient's blood pressure, pulse, and                            oxygen saturations were monitored continuously. The                            [id number]-[id number] ([id number]) Olympus endoscope  was introduced                            through the mouth, and advanced to the second part                            of duodenum. The upper GI endoscopy was                            accomplished without difficulty. The patient                            tolerated the procedure well. Scope In: Scope Out: Findings:      The examined esophagus was normal.       The Z-line was regular and was found 36 cm from the incisors.      Multiple 3 to 5 mm sessile polyps with no bleeding and stigmata of       recent bleeding were found in the gastric body. Biopsies were taken with       a cold forceps for histology.      Diffuse mildly erythematous mucosa without bleeding was found in the       gastric body and in the gastric antrum. Biopsies were taken with a cold       forceps for Helicobacter pylori testing.      The examined duodenum was normal. Impression:               - Normal esophagus.                           - Z-line regular, 36 cm from the incisors.                           - Multiple gastric polyps. Biopsied.                           - Erythematous mucosa in the gastric body and                            antrum. Biopsied.                           - Normal examined duodenum. Moderate Sedation:      See the other procedure note for documentation of moderate sedation with       intraservice time. Recommendation:           - Return patient to hospital ward for ongoing care.                           - Resume previous diet.                           - Continue present medications.                           - Resume Eliquis (apixaban) at prior dose today.                           -  Await pathology results.                           - Discontinue erythromycin.                           - Trial metoclopramide 5 mg IV Q8Hr.                           - Consider hydrogen breath testing to evaluate for                            small intestinal bacterial overgrowth in the                            outpatient setting.                           - [facility] will follow. Procedure Code(s):        --- Professional ---                           [phone], Esophagogastroduodenoscopy, flexible,                            transoral; with biopsy, single or multiple Diagnosis Code(s):        --- Professional ---                           K31.7, Polyp of  stomach and duodenum                           K31.89, Other diseases of stomach and duodenum                           R11.0, Nausea                           R11.15, Cyclical vomiting syndrome unrelated to                            migraine CPT copyright 2022 American Medical Association. All rights reserved. The codes documented in this report are preliminary and upon coder review may  be revised to meet current compliance requirements. Dr Liliane Shi, DO Liliane Shi DO, DO 12/30/2023 12:24:37 PM Number of Addenda: 0

## 2023-12-31 LAB — SURGICAL PATHOLOGY

## 2024-01-02 ENCOUNTER — Encounter (HOSPITAL_COMMUNITY): Payer: Self-pay | Admitting: Internal Medicine

## 2024-01-03 ENCOUNTER — Ambulatory Visit: Payer: Self-pay | Admitting: Licensed Clinical Social Worker

## 2024-01-03 NOTE — Patient Outreach (Signed)
 Care Coordination   Follow Up Visit Note   01/03/2024 Name: Kristen [name] MRN: [id number] DOB: [birth year]  Kristen [name] is a 89 y.o. year old female who sees Emilio Aspen, MD for primary care. I spoke with  Inda Merlin adult daughter, Darl Pikes, by phone today.  What matters to the patients health and wellness today?  Level of Care, Symptom Management, Appt Review    Goals Addressed             This Visit's Progress    Obtain Supportive Resources-Caregiver Strain   On track    Activities and task to complete in order to accomplish goals.   Keep all upcoming appointments discussed today Continue with compliance of taking medication prescribed by Doctor Implement healthy coping skills discussed to assist with management of symptoms. F/up with psychiatrist regarding ongoing depression/anxiety symptoms and medication adjustments Complete new pt ppwk for oral surgeon to go on wait list. Initial appt 12/23/23 Schedule f/up call/appt with Laurette Schimke         SDOH assessments and interventions completed:  No     Care Coordination Interventions:  Yes, provided  Interventions Today    Flowsheet Row Most Recent Value  Chronic Disease   Chronic disease during today's visit Hypertension (HTN), Chronic Kidney Disease/End Stage Renal Disease (ESRD), Other  [Chronic Pain Disorder]  General Interventions   General Interventions Discussed/Reviewed General Interventions Reviewed, Doctor Visits, Level of Care  Doctor Visits Discussed/Reviewed Doctor Visits Reviewed, Specialist  [Daughter will call Laurette Schimke today (3/19 8AM-12PM) PCP appt is 3/21 and Psychiatrist 3/17 4:30]  Level of Care Personal Care Services  [Pt has an aid daily for approx 2 hrs in the morning to assist with running errands or personal care needs]  Mental Health Interventions   Mental Health Discussed/Reviewed Mental Health Reviewed, Coping Strategies  [Pt has pending Genomic testing to determine which meds can be effective.  Had it completed for antidepressants.]  Safety Interventions   Safety Discussed/Reviewed Safety Reviewed       Follow up plan: Follow up call scheduled for 4-6 weeks    Encounter Outcome:  Patient Visit Completed   Jenel Lucks, LCSW [place]  [facility], [facility] Clinical Social Worker Direct Dial: [phone]  Fax: [phone] Website: [email or web address] 3:48 PM

## 2024-01-03 NOTE — Patient Instructions (Signed)
 Visit Information  Thank you for taking time to visit with me today. Please don't hesitate to contact me if I can be of assistance to you.   Following are the goals we discussed today:   Goals Addressed             This Visit's Progress    Obtain Supportive Resources-Caregiver Strain   On track    Activities and task to complete in order to accomplish goals.   Keep all upcoming appointments discussed today Continue with compliance of taking medication prescribed by Doctor Implement healthy coping skills discussed to assist with management of symptoms. F/up with psychiatrist regarding ongoing depression/anxiety symptoms and medication adjustments Complete new pt ppwk for oral surgeon to go on wait list. Initial appt 12/23/23 Schedule f/up call/appt with Laurette Schimke         Our next appointment is by telephone on 4/7 at 11  Please call the care guide team at [phone] if you need to cancel or reschedule your appointment.   If you are experiencing a Mental Health or Behavioral Health Crisis or need someone to talk to, please call the Suicide and Crisis Lifeline: 988 call 911   Patient verbalizes understanding of instructions and care plan provided today and agrees to view in MyChart. Active MyChart status and patient understanding of how to access instructions and care plan via MyChart confirmed with patient.     Windy Fast [facility]  [facility], [facility] Clinical Social Worker Direct Dial: [phone]  Fax: [phone] Website: [email or web address] 3:49 PM

## 2024-01-05 ENCOUNTER — Ambulatory Visit: Payer: 59 | Admitting: Emergency Medicine

## 2024-01-06 NOTE — Discharge Summary (Addendum)
 Patient was identified to have mild hyponatremia throughout the hospitalization, thought to possibly be secondary to SIADH related to concerns for ongoing adenocarcinoma. Physician Discharge Summary   Patient: Kristen [name] MRN: [id number] DOB: 30-Nov-1932  Admit date:     12/24/2023  Discharge date: 12/30/2023  Discharge Physician: Marinda Elk   PCP: Emilio Aspen, MD   Recommendations at discharge:   Please take all prescribed medications exactly as instructed including your remaining course of antibiotics for your urinary tract infection and your new drug, Metoclopramide, to help with your gastric motility and nausea. Please consume a low fat diet.  Please avoid spicy food and alcohol.   Please increase your physical activity as tolerated. Please maintain all outpatient follow-up appointments including follow-up with your primary care provider and Dr. Lorenso Quarry with Gastroenterology. Please return to the emergency department if you develop worsening weakness, vomiting, inability to tolerate oral intake or fevers in excess of 100.4 F.  Discharge Diagnoses: Principal Problem:   Intractable nausea Active Problems:   Essential hypertension   History of pulmonary embolism   Renal cyst, right   SIADH (syndrome of inappropriate ADH production) (HCC)   Adenocarcinoma of lung (HCC)   Hypomagnesemia   Trigeminal neuralgia   E-coli UTI  Resolved Problems:   * No resolved hospital problems. [facility]: 89 year old F with PMH of COPD, lung nodule concerning for adenocarcinoma, PET scan on 11/26/2023 concerning for adenocarcinoma in RUL, right renal lesion and hypermetabolic activity in sigmoid colon-followed by gi pccm, chronic pain on p.o. Dilaudid, PE on Eliquis, hyponatremia, HTN, HLD, anxiety and depression presented with chronic nausea with vomiting, poor p.o. intake and unintentional weight loss AND admitted for nausea along with hyponatremia for further workup of  abnormal PET scan.  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  Patient was also found to have a urinary tract infection with urine cultures growing out E. coli.  Patient initiated on antibiotics in case this was possibly contributing to patient's ongoing nausea.  Gastroenterology was consulted and it was recommended the patient be placed on  empiric PPI and sucralfate as well as erythromycin.after experiencing no improvement in symptoms patient underwent upper endoscopy on 3/6 by Dr. Lorenso Quarry.  This revealed some erythematous mucosa in the gastric body and multiple gastric polyps but no obvious etiology for the patient's symptoms.  With a negative workup thus far erythromycin was discontinued and patient was instead placed on a trial regimen of metoclopramide and arrangements were made for the patient to follow-up with gastroenterology in the outpatient setting.  Patient was also discharged on the remainder of her antibiotics for her urinary tract infection as well.    Gastroenterology plans on pursuing evaluations for small intestinal bacterial overgrowth and hydrogen breath testing as well as possible nuclear gastric emptying study in the outpatient setting.  Patient was discharged home in improved and stable condition on 12/30/2023.        Pain control - [employer] Controlled Substance Reporting System database was reviewed. and patient was instructed, not to drive, operate heavy machinery, perform activities at heights, swimming or participation in water activities or provide baby-sitting services while on Pain, Sleep and Anxiety Medications; until their outpatient Physician has advised to do so again. Also recommended to not to take more than prescribed Pain, Sleep and Anxiety Medications.   Consultants: Dr. Lorenso Quarry with Gastroenterology Procedures performed: Upper Endoscopy  12/30/2023  Disposition:  Home Diet recommendation:  Discharge Diet Orders (From admission, onward)     Start     Ordered   12/30/23 0000  Diet - low sodium heart healthy        12/30/23 1608           Regular diet  DISCHARGE MEDICATION: Allergies as of 12/30/2023       Reactions   Penicillins Hives, Rash   Other reaction(s): Other unknown   Tetracyclines & Related Anaphylaxis   Other reaction(s): Other unknown   Sulfa Antibiotics Hives, Other (See Comments)   unknown        Medication List     STOP taking these medications    acetaminophen 650 MG CR tablet Commonly known as: TYLENOL       TAKE these medications    cyanocobalamin 1000 MCG/ML injection Commonly known as: VITAMIN B12 Inject 100 mcg into the muscle every 30 (thirty) days.   diclofenac Sodium 1 % Gel Commonly known as: VOLTAREN Apply 4 g topically 4 (four) times daily as needed (For pain).   docusate sodium 50 MG capsule Commonly known as: COLACE Take 50 mg by mouth daily as needed for mild constipation.   Eliquis 5 MG Tabs tablet Generic drug: apixaban Take 5 mg by mouth 2 (two) times daily.   esomeprazole 40 MG capsule Commonly known as: NEXIUM Take 40 mg by mouth daily.   HYDROmorphone 2 MG tablet Commonly known as: DILAUDID Take 1 tablet (2 mg total) by mouth in the morning and at bedtime. What changed:  when to take this reasons to take this   hydrOXYzine 25 MG capsule Commonly known as: VISTARIL Take 25 mg by mouth at bedtime.   hydrOXYzine 25 MG tablet Commonly known as: ATARAX Take 25 mg by mouth at bedtime. Notes to patient: Duplicate entry   ipratropium 0.06 % nasal spray Commonly known as: ATROVENT Place 1 spray into both nostrils daily as needed for rhinitis.   losartan 25 MG tablet Commonly known as: COZAAR Take 25 mg by mouth daily.   metoCLOPramide 5 MG tablet Commonly known as: REGLAN Take 1 tablet (5 mg total) by mouth 3 (three) times daily before  meals.   ondansetron 8 MG tablet Commonly known as: ZOFRAN Take 16 mg by mouth 3 (three) times daily as needed for nausea or vomiting.   prochlorperazine 5 MG tablet Commonly known as: COMPAZINE Take 5 mg by mouth every 6 (six) hours as needed for vomiting or nausea.   SYRINGE 3CC/25GX1-1/2" 25G X 1-1/2" 3 ML Misc 1 each by Does not apply route every 30 (thirty) days. For B12 injection       ASK your doctor about these medications    cefdinir 300 MG capsule Commonly known as: OMNICEF Take 1 capsule (300 mg total) by mouth 2 (two) times daily for 4 days. Ask about: Should I take this medication?        Follow-up Information     Lynann Bologna, DO. Schedule an appointment as soon as possible for a visit in 2 week(s).   Specialty: Gastroenterology Contact information: [address] [place] [address] [phone]         Emilio Aspen, MD. Schedule an appointment as soon as possible for a visit in 1 week(s).   Specialty: Internal Medicine Contact information: [address] [place] [address] [phone]  Discharge Exam: Filed Weights   12/25/23 1047 12/27/23 1144 12/30/23 1049  Weight: 51.8 kg 51.8 kg 51.3 kg    Constitutional: Awake alert and oriented x3, no associated distress.   Respiratory: clear to auscultation bilaterally, no wheezing, no crackles. Normal respiratory effort. No accessory muscle use.  Cardiovascular: Regular rate and rhythm, no murmurs / rubs / gallops. No extremity edema. 2+ pedal pulses. No carotid bruits.  Abdomen: Abdomen is soft and nontender.  No evidence of intra-abdominal masses.  Positive bowel sounds noted in all quadrants.   Musculoskeletal: No joint deformity upper and lower extremities. Good ROM, no contractures. Normal muscle tone.     Condition at discharge: fair  The results of significant diagnostics from this hospitalization (including imaging,  microbiology, ancillary and laboratory) are listed below for reference.   Imaging Studies: CT CERVICAL SPINE WO CONTRAST Result Date: 12/09/2023 CLINICAL DATA:  Polytrauma, blunt.  Fall. EXAM: CT CERVICAL SPINE WITHOUT CONTRAST TECHNIQUE: Multidetector CT imaging of the cervical spine was performed without intravenous contrast. Multiplanar CT image reconstructions were also generated. RADIATION DOSE REDUCTION: This exam was performed according to the departmental dose-optimization program which includes automated exposure control, adjustment of the mA and/or kV according to patient size and/or use of iterative reconstruction technique. COMPARISON:  None Available. FINDINGS: Alignment: No subluxation. Skull base and vertebrae: No acute fracture. No primary bone lesion or focal pathologic process. Soft tissues and spinal canal: No prevertebral fluid or swelling. No visible canal hematoma. Disc levels:  Diffuse degenerative disc disease and facet disease. Upper chest: No acute findings Other: None IMPRESSION: No acute bony abnormality. Electronically Signed   By: Charlett Nose M.D.   On: 12/09/2023 20:39   CT HEAD WO CONTRAST ( ) Result Date: 12/09/2023 CLINICAL DATA:  Insert history.  Fall. EXAM: CT HEAD WITHOUT CONTRAST TECHNIQUE: Contiguous axial images were obtained from the base of the skull through the vertex without intravenous contrast. RADIATION DOSE REDUCTION: This exam was performed according to the departmental dose-optimization program which includes automated exposure control, adjustment of the mA and/or kV according to patient size and/or use of iterative reconstruction technique. COMPARISON:  10/29/2023 FINDINGS: Brain: No acute intracranial abnormality. Specifically, no hemorrhage, hydrocephalus, mass lesion, acute infarction, or significant intracranial injury. Vascular: No hyperdense vessel or unexpected calcification. Skull: No acute calvarial abnormality. Sinuses/Orbits: No acute findings  Other: None IMPRESSION: No acute intracranial abnormality. Electronically Signed   By: Charlett Nose M.D.   On: 12/09/2023 20:37   DG Knee Complete 4 Views Right Result Date: 12/09/2023 CLINICAL DATA:  Slipped and fell, right knee pain EXAM: RIGHT KNEE - COMPLETE 4+ VIEW COMPARISON:  10/29/2023 FINDINGS: Frontal, bilateral oblique, and lateral views of the right knee are obtained. 3 component right knee arthroplasty is identified in the expected position without evidence of acute complication. No acute displaced fracture. No evidence of joint effusion. Soft tissues are unremarkable. IMPRESSION: 1. Unremarkable right knee arthroplasty.  No acute fracture. Electronically Signed   By: Sharlet Salina M.D.   On: 12/09/2023 20:06   DG Elbow Complete Right Result Date: 12/09/2023 CLINICAL DATA:  Slipped and fell, right elbow pain EXAM: RIGHT ELBOW - COMPLETE 3+ VIEW COMPARISON:  10/13/2022 FINDINGS: Frontal, bilateral oblique, lateral views of the right elbow are obtained. No fracture, subluxation, or dislocation. No evidence of joint effusion. Soft tissues are unremarkable. IMPRESSION: 1. No acute displaced fracture. Electronically Signed   By: Sharlet Salina M.D.   On: 12/09/2023 20:05   DG Hip Unilat  With  Pelvis 2-3 Views Right Result Date: 12/09/2023 CLINICAL DATA:  Larey Seat, landed on right side, right hip surgery 10/30/2023 EXAM: DG HIP (WITH OR WITHOUT PELVIS) 2-3V RIGHT COMPARISON:  10/30/2023 FINDINGS: Frontal view of the pelvis as well as frontal and cross-table lateral views of the right hip are obtained. Right hip arthroplasty is identified in the expected position without evidence of acute complication. There are no acute displaced fractures. Sacroiliac joints are unremarkable. Stable degenerative changes of the lower lumbar spine. IMPRESSION: 1. Unremarkable right hip arthroplasty. 2. No acute displaced fracture. Electronically Signed   By: Sharlet Salina M.D.   On: 12/09/2023 20:03     Microbiology: Results for orders placed or performed during the hospital encounter of 12/24/23  Culture, OB Urine     Status: Abnormal   Collection Time: 12/28/23 12:10 PM   Specimen: Urine, Random  Result Value Ref Range Status   Specimen Description   Final    URINE, RANDOM Performed at [facility], [address] [place], [place] [address]    Special Requests   Final    NONE Performed at [facility], [address], [place], [place] [address]    Culture (A)  Final    >=100,000 COLONIES/mL ESCHERICHIA COLI NO GROUP B STREP (S.AGALACTIAE) ISOLATED Performed at [facility], [address], [place], [place] [address]    Report Status 12/30/2023 FINAL  Final   Organism ID, Bacteria ESCHERICHIA COLI (A)  Final      Susceptibility   Escherichia coli - MIC*    AMPICILLIN 4 SENSITIVE Sensitive     CEFEPIME <=0.12 SENSITIVE Sensitive     CEFTRIAXONE <=0.25 SENSITIVE Sensitive     CIPROFLOXACIN >=4 RESISTANT Resistant     GENTAMICIN <=1 SENSITIVE Sensitive     IMIPENEM <=0.25 SENSITIVE Sensitive     NITROFURANTOIN <=16 SENSITIVE Sensitive     TRIMETH/SULFA <=20 SENSITIVE Sensitive     AMPICILLIN/SULBACTAM <=2 SENSITIVE Sensitive     PIP/TAZO <=4 SENSITIVE Sensitive ug/mL    * >=100,000 COLONIES/mL ESCHERICHIA COLI    Labs: CBC: No results for input(s): "WBC", "NEUTROABS", "HGB", "HCT", "MCV", "PLT" in the last 168 hours. Basic Metabolic Panel: No results for input(s): "NA", "K", "CL", "CO2", "GLUCOSE", "BUN", "CREATININE", "CALCIUM", "MG", "PHOS" in the last 168 hours. Liver Function Tests: No results for input(s): "AST", "ALT", "ALKPHOS", "BILITOT", "PROT", "ALBUMIN" in the last 168 hours. CBG: No results for input(s): "GLUCAP" in the last 168 hours.  Discharge time spent: greater than 30 minutes.  Signed: Marinda Elk, MD [facility] 01/06/2024

## 2024-01-07 ENCOUNTER — Telehealth: Payer: Self-pay | Admitting: Pharmacist

## 2024-01-07 NOTE — Progress Notes (Signed)
   01/07/2024  Patient ID: Kristen [name], female   DOB: [birth year], 89 y.o.   MRN: [id number]  Kristen [name] 12/23/2023 06:37:57 PM >  See follow-up note above if needed. [name], Kristen Blumenthal 12/31/2023 09:09:48 AM >  EGD completed yesterday. Appears patient has several gastric polyps and inflamed/red mucosa that was biopsied and H pylori testing completed. Appears TOC called patient earlier this morning. 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 " If fundic gland, recommendation would be discontinuation of Esomeprazole daily. 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 Kristen [name] 12/31/2023 04:34:34 PM >  With information obtained from hospitalization and if patient still having severe nausea, I was recommended by one of the [facility] Pulmonology doctors to consider Zofran 8mg  twice a day scheduled, dexamethasone 4mg  daily, and olanzapine 2.5 to 5 mg daily for 5 days to see if this combination helps with nausea (used for oncology patients). Do you have any thoughts on this and/or if GI would be best to ask for their thoughts? Please advise! Kristen [name] 01/03/2024 03:26:39 PM > I am deferring further recommendations to GI. Kristen [name] 01/07/2024 01:59:04 PM > Called and spoke with the daughter on the phone today. Reports having PCP follow-up on 01/14/24. Has a sitter in the mornings now to watch over patient. Visit on Monday at 4:30PM with Dr. Donell Beers- will review med recommendations in scanned notes. Advised the triple regimen above. Will send a message to Dr. Marca Ancona with recommendation for 02/17/24 visit.  Note sent to Dr. Marca Ancona with nausea recommendations.  Will follow along with outcome of [place] and [place] visits.   Also started metolcopramide on 12/30/23 for 30 days. Can take  no longer than 12 weeks= 3 months.   Kristen [name], PharmD [facility] Phone Number: [phone]

## 2024-01-11 ENCOUNTER — Telehealth: Payer: Self-pay | Admitting: Pharmacist

## 2024-01-11 NOTE — Progress Notes (Signed)
   01/11/2024  Patient ID: Kristen [name], female   DOB: 1932/11/25, 89 y.o.   MRN: [id number]  Received call from daughter Darl Pikes regarding updates on nausea medication suggestions made to Dr. Marca Ancona. Advised my recommendations were sent, but will likely be addressed at the 01/17/24 visit since the TE was closed. Daughter has concerns regarding necessity of the gastric emptying test as she was told the patient will be advised to eat something and lay on a table for about 4 hours. Said her back is curved and will likely have back pain if laying flat for that long. Also has PCP hospital follow-up scheduled with Dr. Orson Aloe at the same time of the study.   Recommended to call GI with concerns about upcoming study and necessity to complete it. This will decide which appointment will be attended on Friday.  Also reports Gene study returned, and patient will be starting low-dose Wellbutrin. Will monitor for any mental changes.    Ricka Burdock, PharmD [facility]  Phone Number: [phone]

## 2024-01-14 ENCOUNTER — Encounter (HOSPITAL_COMMUNITY)
Admission: RE | Admit: 2024-01-14 | Discharge: 2024-01-14 | Disposition: A | Discharge status: Home / Self Care | Source: Ambulatory Visit | Attending: Nurse Practitioner | Admitting: Nurse Practitioner

## 2024-01-14 DIAGNOSIS — R110 Nausea: Secondary | ICD-10-CM | POA: Diagnosis present

## 2024-01-14 MED ORDER — TECHNETIUM TC 99M SULFUR COLLOID: 2.1600 | Freq: Once | INTRAVENOUS | Status: DC | PRN

## 2024-01-14 MED ORDER — TECHNETIUM TC 99M SULFUR COLLOID
2.1600 | Freq: Once | INTRAVENOUS | Status: AC | PRN
  Administered 2024-01-14: 2.16 via ORAL

## 2024-01-24 ENCOUNTER — Other Ambulatory Visit: Payer: Self-pay | Admitting: Pharmacist

## 2024-01-24 ENCOUNTER — Telehealth: Payer: Self-pay | Admitting: Pharmacist

## 2024-01-24 NOTE — Progress Notes (Unsigned)
   01/24/2024  Patient ID: Kristen [name], female   DOB: 02/26/1933, 89 y.o.   MRN: [id number]  Called and spoke with daughter Darl Pikes on the phone today.  Nausea has gone away. Advised it does not appear she will be getting Reglan this month, but monitor to see if nausea returns. If so, they may have been the solution previously. No further recommendations from Dr. Marca Ancona- said to focus on pain management going forward.  Per last call, patient was to be started on Wellbutrin based on Gene Study results. However, desvenlafaxine ER 25mg  each morning was filled in most recent packaging- sent by Dr. Donell Beers.  Plan to call office to see why this was sent in, get results of Gene Study (send to Darl Pikes once receiving), and get latest office note scanned into chart.  Will call Darl Pikes back regarding information discovered. Unable to reach at this time due to lunch break.   No further concerns at this time.  Update from 01/25/24: - Unable to reach a physician or staff member with phone prompts - However, left a message with medical records team requesting last visit notes and gene study get sent over  - If not heard back by tomorrow afternoon, will call and notify Darl Pikes of this difficulty  Update from 01/26/24:  - Sent GeneSight study information to [place] through email per request  - Desvenlafaxine was correct option based on results- as compared to Bupropion - Will ensure iron is re-added to packs at next dispense  - Advised to monitor for darkened stools when re-starting     Ricka Burdock, PharmD [facility]  Phone Number: [phone]

## 2024-01-31 ENCOUNTER — Encounter: Payer: Self-pay | Admitting: Licensed Clinical Social Worker

## 2024-02-02 ENCOUNTER — Telehealth: Payer: Self-pay | Admitting: *Deleted

## 2024-02-02 NOTE — Progress Notes (Signed)
 Complex Care Management Care Guide Note  02/02/2024 Name: Kristen [name] MRN: [id number] DOB: 1933-07-04  Kristen [name] is a 89 y.o. year old female who is a primary care patient of Emilio Aspen, MD and is actively engaged with the care management team. I reached out to [name] by phone today to assist with re-scheduling  with the Licensed Clinical Child psychotherapist.  Follow up plan: Unsuccessful telephone outreach attempt made. A HIPAA compliant phone message was left for the patient providing contact information and requesting a return call.  Gwenevere Ghazi  [facility]  Value-Based Care Institute, [facility] Guide  Direct Dial: [phone]  Fax [phone]

## 2024-02-07 NOTE — Progress Notes (Signed)
 Complex Care Management Care Guide Note  02/07/2024 Name: Kristen [name] MRN: [id number] DOB: 24-Sep-1933  Kristen [name] is a 89 y.o. year old female who is a primary care patient of Benedetta Bradley, MD and is actively engaged with the care management team. I reached out to Patrici Boom by phone today to assist with re-scheduling  with the Licensed Clinical Child psychotherapist.  Follow up plan: Telephone appointment with complex care management team member scheduled for:  4/28  Barnie Bora  [facility]  Value-Based Care Institute, [facility] Guide  Direct Dial: [phone]  Fax [phone]

## 2024-02-21 ENCOUNTER — Other Ambulatory Visit: Payer: Self-pay | Admitting: Licensed Clinical Social Worker

## 2024-02-23 NOTE — Patient Outreach (Signed)
 Complex Care Management   Visit Note  02/21/2024  Name:  Kristen [name] MRN: [id number] DOB: 1933-04-03  Situation: Referral received for Complex Care Management related to  Stress Management  I obtained verbal consent from Patient.  Visit completed with Caregiver/Daughter  on the phone  Background:   Past Medical History:  Diagnosis Date   Anemia    Arthritis    B12 deficiency    Chronic kidney disease    Chronic pain disorder    trigeminal neuralgia   Depression    Dyslipidemia    Hypertension    PONV (postoperative nausea and vomiting)    Seizure disorder (HCC)    decades since last seizure   Tremor    Trigeminal nerve disorder     Assessment: Patient Reported Symptoms:  Cognitive Cognitive Status: Alert and oriented to person, place, and time, Normal speech and language skills (Per daughter)      Neurological Neurological Review of Symptoms: No symptoms reported    HEENT HEENT Symptoms Reported: No symptoms reported      Cardiovascular Cardiovascular Symptoms Reported: No symptoms reported    Respiratory Respiratory Symptoms Reported: No symptoms reported    Endocrine Patient reports the following symptoms related to hypoglycemia or hyperglycemia : No symptoms reported    Gastrointestinal Gastrointestinal Symptoms Reported: No symptoms reported      Genitourinary Genitourinary Symptoms Reported: No symptoms reported    Integumentary Integumentary Symptoms Reported: No symptoms reported    Musculoskeletal Musculoskelatal Symptoms Reviewed: No symptoms reported        Psychosocial Psychosocial Symptoms Reported: No symptoms reported Behavioral Management Strategies: Coping strategies Major Change/Loss/Stressor/Fears (CP): Medical condition, self Techniques to Cope with Loss/Stress/Change: Medication Quality of Family Relationships: involved, supportive Do you feel physically threatened by others?: No       No data to display          There were no  vitals filed for this visit.  Medications Reviewed Today     Reviewed by Taliyah Watrous D, LCSW (Social Worker) on 02/21/24 at 1409  Med List Status: <None>   Medication Order Taking? Sig Documenting Provider Last Dose Status Informant  cyanocobalamin  (,VITAMIN B-12,) 1000 MCG/ML injection [id number] Yes Inject 100 mcg into the muscle every 30 (thirty) days. [provider] Taking Active Child           Med Note (SATTERFIELD, Ken Patty   Fri Dec 24, 2023  3:40 PM) Patient and daughter not sure of exact last date taken   diclofenac  Sodium (VOLTAREN ) 1 % GEL [id number] Yes Apply 4 g topically 4 (four) times daily as needed (For pain). [provider] Taking Active Child  docusate sodium  (COLACE) 50 MG capsule [id number] No Take 50 mg by mouth daily as needed for mild constipation.  Patient not taking: Reported on 02/21/2024   [provider] Not Taking Active Child  ELIQUIS  5 MG TABS tablet [id number] Yes Take 5 mg by mouth 2 (two) times daily. [provider] Taking Active Child  esomeprazole (NEXIUM) 40 MG capsule [id number] Yes Take 40 mg by mouth daily. [provider] Taking Active Child  HYDROmorphone  (DILAUDID ) 2 MG tablet [id number] Yes Take 1 tablet (2 mg total) by mouth in the morning and at bedtime.  Patient taking differently: Take 2 mg by mouth 3 (three) times daily as needed for moderate pain (pain score 4-6) or severe pain (pain score 7-10).   Doroteo Gasmen, MD Taking Active Child  hydrOXYzine  (ATARAX ) 25 MG tablet [id number]  Yes Take 25 mg by mouth at bedtime. [provider] Taking Active Child  hydrOXYzine  (VISTARIL ) 25 MG capsule [id number] Yes Take 25 mg by mouth at bedtime. [provider] Taking Active Child           Med Note (SATTERFIELD, Ken Patty   Fri Dec 24, 2023  3:41 PM) Daughter verified patient is taking this medication   ipratropium (ATROVENT ) 0.06 % nasal spray [id number] Yes Place 1 spray into both  nostrils daily as needed for rhinitis. [provider] Taking Active Child  losartan  (COZAAR ) 25 MG tablet [id number] Yes Take 25 mg by mouth daily. [provider] Taking Active Child  metoCLOPramide  (REGLAN ) 5 MG tablet [id number] No Take 1 tablet (5 mg total) by mouth 3 (three) times daily before meals.  Patient not taking: Reported on 02/21/2024   True Fuss, MD Not Taking Active   ondansetron  (ZOFRAN ) 8 MG tablet [id number] Yes Take 16 mg by mouth 3 (three) times daily as needed for nausea or vomiting. [provider] Taking Active Child  prochlorperazine (COMPAZINE) 5 MG tablet [id number] Yes Take 5 mg by mouth every 6 (six) hours as needed for vomiting or nausea. [provider] Taking Active Child  Syringe/Needle, Disp, (SYRINGE 3CC/25GX1-1/2") 25G X 1-1/2" 3 ML MISC [id number] Yes 1 each by Does not apply route every 30 (thirty) days. For B12 injection [provider] Taking Active Child            Recommendation:   Continue utilizing strategies discussed to assist with symptom management  Follow Up Plan:   Telephone follow-up 4-6 weeks  Alease Hunter, LCSW [facility]  [facility], [facility] Clinical Social Worker Direct Dial: [phone]  Fax: [phone] Website: [email or web address] 10:05 AM

## 2024-02-23 NOTE — Patient Instructions (Signed)
 Visit Information  Thank you for taking time to visit with me today. Please don't hesitate to contact me if I can be of assistance to you before our next scheduled appointment.  Our next appointment is by telephone on 6/16 at 11:30 AM Please call the care guide team at [phone] if you need to cancel or reschedule your appointment.   Following is a copy of your care plan:   Goals Addressed             This Visit's Progress    LCSW [facility] Social Work Care Plan   On track    Problems:   Level of care concerns  CSW Clinical Goal(s):   Over the next 90 days the Patient will attend all scheduled medical appointments as evidenced by patient report and care team review of appointment completion in electronic MEDICAL RECORD NUMBER  .  Interventions:  Mental Health:  Evaluation of current treatment plan related to  Stress Management Active listening / Reflection utilized Caregiver stress acknowledged :  Emotional Support Provided Mindfulness or Relaxation training provided Solution-Focued Strategies employed:  Patient Goals/Self-Care Activities:  Increase coping skills and healthy habits  Plan:   Telephone follow up appointment with care management team member scheduled for:  4-6 weeks     COMPLETED: Obtain Supportive Resources-Caregiver Strain       Activities and task to complete in order to accomplish goals.   Keep all upcoming appointments discussed today Continue with compliance of taking medication prescribed by Doctor Implement healthy coping skills discussed to assist with management of symptoms. F/up with psychiatrist regarding ongoing depression/anxiety symptoms and medication adjustments Complete new pt ppwk for oral surgeon to go on wait list. Initial appt 12/23/23 Schedule f/up call/appt with Gastro         Please call the Suicide and Crisis Lifeline: 988 call 911 if you are experiencing a Mental Health or Behavioral Health Crisis or need someone to talk  to.  Patient verbalizes understanding of instructions and care plan provided today and agrees to view in MyChart. Active MyChart status and patient understanding of how to access instructions and care plan via MyChart confirmed with patient.     Arlis Bent [facility]  [facility], [facility] Clinical Social Worker Direct Dial: [phone]  Fax: [phone] Website: [email or web address] 10:05 AM

## 2024-03-02 ENCOUNTER — Other Ambulatory Visit: Payer: Self-pay

## 2024-03-02 ENCOUNTER — Other Ambulatory Visit (HOSPITAL_COMMUNITY): Payer: Self-pay

## 2024-03-03 ENCOUNTER — Other Ambulatory Visit: Payer: Self-pay

## 2024-03-06 ENCOUNTER — Encounter (HOSPITAL_COMMUNITY): Payer: Self-pay

## 2024-03-06 ENCOUNTER — Other Ambulatory Visit: Payer: Self-pay

## 2024-03-06 ENCOUNTER — Emergency Department (HOSPITAL_COMMUNITY)
Admission: EM | Admit: 2024-03-06 | Discharge: 2024-03-06 | Disposition: A | Discharge status: Home / Self Care | Attending: Emergency Medicine | Admitting: Emergency Medicine

## 2024-03-06 ENCOUNTER — Emergency Department (HOSPITAL_COMMUNITY)

## 2024-03-06 DIAGNOSIS — J189 Pneumonia, unspecified organism: Secondary | ICD-10-CM | POA: Insufficient documentation

## 2024-03-06 DIAGNOSIS — E860 Dehydration: Secondary | ICD-10-CM | POA: Insufficient documentation

## 2024-03-06 DIAGNOSIS — R531 Weakness: Secondary | ICD-10-CM | POA: Diagnosis not present

## 2024-03-06 DIAGNOSIS — Z7901 Long term (current) use of anticoagulants: Secondary | ICD-10-CM | POA: Diagnosis not present

## 2024-03-06 DIAGNOSIS — I10 Essential (primary) hypertension: Secondary | ICD-10-CM | POA: Diagnosis not present

## 2024-03-06 DIAGNOSIS — I4891 Unspecified atrial fibrillation: Secondary | ICD-10-CM | POA: Diagnosis not present

## 2024-03-06 DIAGNOSIS — Z79899 Other long term (current) drug therapy: Secondary | ICD-10-CM | POA: Insufficient documentation

## 2024-03-06 DIAGNOSIS — R110 Nausea: Secondary | ICD-10-CM | POA: Diagnosis not present

## 2024-03-06 DIAGNOSIS — I959 Hypotension, unspecified: Secondary | ICD-10-CM | POA: Diagnosis not present

## 2024-03-06 DIAGNOSIS — R918 Other nonspecific abnormal finding of lung field: Secondary | ICD-10-CM | POA: Diagnosis not present

## 2024-03-06 DIAGNOSIS — I700 Atherosclerosis of aorta: Secondary | ICD-10-CM | POA: Diagnosis not present

## 2024-03-06 LAB — COMPREHENSIVE METABOLIC PANEL WITH GFR
ALT: 9 U/L (ref 0–44)
AST: 18 U/L (ref 15–41)
Albumin: 3.6 g/dL (ref 3.5–5.0)
Alkaline Phosphatase: 69 U/L (ref 38–126)
Anion gap: 7 (ref 5–15)
BUN: 16 mg/dL (ref 8–23)
CO2: 29 mmol/L (ref 22–32)
Calcium: 8.8 mg/dL — ABNORMAL LOW (ref 8.9–10.3)
Chloride: 96 mmol/L — ABNORMAL LOW (ref 98–111)
Creatinine, Ser: 0.98 mg/dL (ref 0.44–1.00)
GFR, Estimated: 56 mL/min — ABNORMAL LOW (ref >60)
Glucose, Bld: 119 mg/dL — ABNORMAL HIGH (ref 70–99)
Potassium: 4.3 mmol/L (ref 3.5–5.1)
Sodium: 132 mmol/L — ABNORMAL LOW (ref 135–145)
Total Bilirubin: 0.9 mg/dL (ref 0.0–1.2)
Total Protein: 6.2 g/dL — ABNORMAL LOW (ref 6.5–8.1)

## 2024-03-06 LAB — CBC WITH DIFFERENTIAL/PLATELET
Abs Immature Granulocytes: 0.03 10*3/uL (ref 0.00–0.07)
Basophils Absolute: 0.1 10*3/uL (ref 0.0–0.1)
Basophils Relative: 1 %
Eosinophils Absolute: 0.1 10*3/uL (ref 0.0–0.5)
Eosinophils Relative: 1 %
HCT: 38.4 % (ref 36.0–46.0)
Hemoglobin: 12.6 g/dL (ref 12.0–15.0)
Immature Granulocytes: 0 %
Lymphocytes Relative: 26 %
Lymphs Abs: 2.2 10*3/uL (ref 0.7–4.0)
MCH: 31 pg (ref 26.0–34.0)
MCHC: 32.8 g/dL (ref 30.0–36.0)
MCV: 94.6 fL (ref 80.0–100.0)
Monocytes Absolute: 0.7 10*3/uL (ref 0.1–1.0)
Monocytes Relative: 8 %
Neutro Abs: 5.3 10*3/uL (ref 1.7–7.7)
Neutrophils Relative %: 64 %
Platelets: 298 10*3/uL (ref 150–400)
RBC: 4.06 MIL/uL (ref 3.87–5.11)
RDW: 12.4 % (ref 11.5–15.5)
WBC: 8.4 10*3/uL (ref 4.0–10.5)
nRBC: 0 % (ref 0.0–0.2)

## 2024-03-06 LAB — URINALYSIS, ROUTINE W REFLEX MICROSCOPIC
Bilirubin Urine: NEGATIVE
Glucose, UA: NEGATIVE mg/dL
Hgb urine dipstick: NEGATIVE
Ketones, ur: NEGATIVE mg/dL
Leukocytes,Ua: NEGATIVE
Nitrite: NEGATIVE
Protein, ur: NEGATIVE mg/dL
Specific Gravity, Urine: 1.011 (ref 1.005–1.030)
pH: 7 (ref 5.0–8.0)

## 2024-03-06 LAB — TROPONIN I (HIGH SENSITIVITY): Troponin I (High Sensitivity): 14 ng/L (ref <18)

## 2024-03-06 LAB — TSH: TSH: 1.969 u[IU]/mL (ref 0.350–4.500)

## 2024-03-06 MED ORDER — LEVOFLOXACIN 500 MG PO TABS
500.0000 mg | ORAL_TABLET | Freq: Once | ORAL | Status: AC
  Administered 2024-03-06: 500 mg via ORAL
  Filled 2024-03-06: qty 1

## 2024-03-06 MED ORDER — SODIUM CHLORIDE 0.9 % IV BOLUS
1000.0000 mL | Freq: Once | INTRAVENOUS | Status: AC
Start: 2024-03-06 — End: 2024-03-06
  Administered 2024-03-06: 1000 mL via INTRAVENOUS

## 2024-03-06 MED ORDER — SODIUM CHLORIDE 0.9 % IV BOLUS: 1000.0000 mL | Freq: Once | INTRAVENOUS | Status: DC

## 2024-03-06 MED ORDER — LEVOFLOXACIN 500 MG PO TABS: 500.0000 mg | ORAL_TABLET | Freq: Every day | ORAL | 0 refills | Status: DC

## 2024-03-06 NOTE — ED Provider Notes (Signed)
 [place] EMERGENCY DEPARTMENT AT [facility] Provider Note   CSN: [id number] Arrival date & time: 03/06/24  2130     History  Chief Complaint  Patient presents with   Weakness   Nausea    Kristen [name] is a 89 y.o. female.  Patient complains of general weakness.  Patient has a history of hypertension.  No fevers no chills no shortness of breath  The history is provided by the patient and medical records. No language interpreter was used.  Weakness Severity:  Mild Onset quality:  Sudden Timing:  Constant Progression:  Waxing and waning Chronicity:  New Context: not alcohol use   Relieved by:  Nothing Worsened by:  Nothing Ineffective treatments:  None tried Associated symptoms: no abdominal pain, no chest pain, no cough, no diarrhea, no frequency, no headaches and no seizures        Home Medications Prior to Admission medications   Medication Sig Start Date End Date Taking? Authorizing Provider  levofloxacin (LEVAQUIN) 500 MG tablet Take 1 tablet (500 mg total) by mouth daily. 03/06/24  Yes Cheyenne Cotta, MD  cyanocobalamin  (,VITAMIN B-12,) 1000 MCG/ML injection Inject 100 mcg into the muscle every 30 (thirty) days. 11/02/19   [provider]  diclofenac  Sodium (VOLTAREN ) 1 % GEL Apply 4 g topically 4 (four) times daily as needed (For pain).    [provider]  docusate sodium  (COLACE) 50 MG capsule Take 50 mg by mouth daily as needed for mild constipation. Patient not taking: Reported on 02/21/2024    [provider]  ELIQUIS  5 MG TABS tablet Take 5 mg by mouth 2 (two) times daily. 09/28/22   [provider]  esomeprazole (NEXIUM) 40 MG capsule Take 40 mg by mouth daily.    [provider]  HYDROmorphone  (DILAUDID ) 2 MG tablet Take 1 tablet (2 mg total) by mouth in the morning and at bedtime. Patient taking differently: Take 2 mg by mouth 3 (three) times daily as needed for moderate pain (pain score 4-6) or severe  pain (pain score 7-10). 11/04/23   Doroteo Gasmen, MD  hydrOXYzine  (ATARAX ) 25 MG tablet Take 25 mg by mouth at bedtime.    [provider]  hydrOXYzine  (VISTARIL ) 25 MG capsule Take 25 mg by mouth at bedtime.    [provider]  ipratropium (ATROVENT ) 0.06 % nasal spray Place 1 spray into both nostrils daily as needed for rhinitis.    [provider]  losartan  (COZAAR ) 25 MG tablet Take 25 mg by mouth daily.    [provider]  metoCLOPramide  (REGLAN ) 5 MG tablet Take 1 tablet (5 mg total) by mouth 3 (three) times daily before meals. Patient not taking: Reported on 02/21/2024 12/30/23   True Fuss, MD  ondansetron  (ZOFRAN ) 8 MG tablet Take 16 mg by mouth 3 (three) times daily as needed for nausea or vomiting.    [provider]  prochlorperazine (COMPAZINE) 5 MG tablet Take 5 mg by mouth every 6 (six) hours as needed for vomiting or nausea. 12/22/23   [provider]  Syringe/Needle, Disp, (SYRINGE 3CC/25GX1-1/2") 25G X 1-1/2" 3 ML MISC 1 each by Does not apply route every 30 (thirty) days. For B12 injection    [provider]      Allergies    Penicillins, Tetracyclines & related, and Sulfa antibiotics    Review of Systems   Review of Systems  Constitutional:  Negative for appetite change and fatigue.  HENT:  Negative for  congestion, ear discharge and sinus pressure.   Eyes:  Negative for discharge.  Respiratory:  Negative for cough.   Cardiovascular:  Negative for chest pain.  Gastrointestinal:  Negative for abdominal pain and diarrhea.  Genitourinary:  Negative for frequency and hematuria.  Musculoskeletal:  Negative for back pain.  Skin:  Negative for rash.  Neurological:  Positive for weakness. Negative for seizures and headaches.  Psychiatric/Behavioral:  Negative for hallucinations.     Physical Exam Updated Vital Signs BP (!) 168/67   Pulse 68   Temp 97.7 F (36.5 C) (Oral)   Resp 14   LMP  (LMP  Unknown)   SpO2 100%  Physical Exam Vitals and nursing note reviewed.  Constitutional:      Appearance: She is well-developed.  HENT:     Head: Normocephalic.     Nose: Nose normal.  Eyes:     General: No scleral icterus.    Conjunctiva/sclera: Conjunctivae normal.  Neck:     Thyroid : No thyromegaly.  Cardiovascular:     Rate and Rhythm: Normal rate and regular rhythm.     Heart sounds: No murmur heard.    No friction rub. No gallop.  Pulmonary:     Breath sounds: No stridor. No wheezing or rales.  Chest:     Chest wall: No tenderness.  Abdominal:     General: There is no distension.     Tenderness: There is no abdominal tenderness. There is no rebound.  Musculoskeletal:        General: Normal range of motion.     Cervical back: Neck supple.  Lymphadenopathy:     Cervical: No cervical adenopathy.  Skin:    Findings: No erythema or rash.  Neurological:     Mental Status: She is alert and oriented to person, place, and time.     Motor: No abnormal muscle tone.     Coordination: Coordination normal.  Psychiatric:        Behavior: Behavior normal.     ED Results / Procedures / Treatments   Labs (all labs ordered are listed, but only abnormal results are displayed) Labs Reviewed  COMPREHENSIVE METABOLIC PANEL WITH GFR - Abnormal; Notable for the following components:      Result Value   Sodium 132 (*)    Chloride 96 (*)    Glucose, Bld 119 (*)    Calcium  8.8 (*)    Total Protein 6.2 (*)    GFR, Estimated 56 (*)    All other components within normal limits  URINALYSIS, ROUTINE W REFLEX MICROSCOPIC - Abnormal; Notable for the following components:   Color, Urine STRAW (*)    All other components within normal limits  CBC WITH DIFFERENTIAL/PLATELET  TSH  TROPONIN I (HIGH SENSITIVITY)    EKG None  Radiology DG Chest Port 1 View Result Date: 03/06/2024 CLINICAL DATA:  Weakness. EXAM: PORTABLE CHEST 1 VIEW COMPARISON:  One-view chest x-ray 10/29/2023.  PET scan  11/26/2023. FINDINGS: Heart size is normal. Atherosclerotic calcifications are present at the aortic arch. Increasing right perihilar density is noted. Right upper lobe opacity is increasing. The left lung is clear. IMPRESSION: 1. Increasing right perihilar density and right upper lobe opacity. This likely represents a combination of atelectasis and pneumonia. 2. Aortic atherosclerosis. Electronically Signed   By: Audree Leas M.D.   On: 03/06/2024 13:14    Procedures Procedures    Medications Ordered in ED Medications  levofloxacin (LEVAQUIN) tablet 500 mg (has no administration in time range)  sodium chloride  0.9 % bolus 1,000 mL (0 mLs Intravenous Stopped 03/06/24 1502)    ED Course/ Medical Decision Making/ A&P                                 Medical Decision Making Amount and/or Complexity of Data Reviewed Labs: ordered. Radiology: ordered. ECG/medicine tests: ordered.  Risk Prescription drug management.   Patient with dehydration and possible pneumonia.  Patient felt better with IV fluids and she will be started on Levaquin and will follow-up with PCP        Final Clinical Impression(s) / ED Diagnoses Final diagnoses:  Dehydration  Community acquired pneumonia of right upper lobe of lung    Rx / DC Orders ED Discharge Orders          Ordered    levofloxacin (LEVAQUIN) 500 MG tablet  Daily        03/06/24 1549              Amanpreet Delmont, MD 03/06/24 1759

## 2024-03-06 NOTE — ED Notes (Signed)
 Pt keeps unhooking herself pt stated she didn't want it on and pt said she the IV fluid is making her cold so she is done with it

## 2024-03-06 NOTE — ED Triage Notes (Signed)
 Patient arrived by EMS from [facility] facility. Patient reports generalized weakness X2 week, also reports dizziness and nausea. Patient A&O X4, EMS reports Afib on EKG. Patient 1 assist due to weakness

## 2024-03-06 NOTE — ED Notes (Signed)
 Attempted PIV x 3. No success. Order for IV team placed

## 2024-03-06 NOTE — Discharge Instructions (Addendum)
 Make sure you drink plenty of fluids and follow-up with your family doctor next week for recheck for the dehydration and possible pneumonia.  Return sooner problems

## 2024-03-07 ENCOUNTER — Encounter: Payer: Self-pay | Admitting: Pharmacist

## 2024-03-07 ENCOUNTER — Other Ambulatory Visit: Payer: Self-pay

## 2024-03-09 ENCOUNTER — Other Ambulatory Visit (HOSPITAL_COMMUNITY): Payer: Self-pay

## 2024-03-09 ENCOUNTER — Other Ambulatory Visit: Payer: Self-pay

## 2024-03-13 ENCOUNTER — Other Ambulatory Visit: Payer: Self-pay

## 2024-03-13 ENCOUNTER — Encounter (HOSPITAL_COMMUNITY): Payer: Self-pay | Admitting: Family Medicine

## 2024-03-13 ENCOUNTER — Emergency Department (HOSPITAL_COMMUNITY)

## 2024-03-13 ENCOUNTER — Inpatient Hospital Stay (HOSPITAL_COMMUNITY)
Admission: EM | Admit: 2024-03-13 | Discharge: 2024-03-17 | DRG: 605 | Disposition: A | Discharge status: Skilled Nursing Facility | Attending: Internal Medicine | Admitting: Internal Medicine

## 2024-03-13 DIAGNOSIS — G40909 Epilepsy, unspecified, not intractable, without status epilepticus: Secondary | ICD-10-CM | POA: Diagnosis not present

## 2024-03-13 DIAGNOSIS — N1831 Chronic kidney disease, stage 3a: Secondary | ICD-10-CM | POA: Diagnosis present

## 2024-03-13 DIAGNOSIS — C3490 Malignant neoplasm of unspecified part of unspecified bronchus or lung: Secondary | ICD-10-CM | POA: Diagnosis not present

## 2024-03-13 DIAGNOSIS — E785 Hyperlipidemia, unspecified: Secondary | ICD-10-CM | POA: Diagnosis present

## 2024-03-13 DIAGNOSIS — Z96641 Presence of right artificial hip joint: Secondary | ICD-10-CM | POA: Diagnosis not present

## 2024-03-13 DIAGNOSIS — J929 Pleural plaque without asbestos: Secondary | ICD-10-CM | POA: Diagnosis not present

## 2024-03-13 DIAGNOSIS — J449 Chronic obstructive pulmonary disease, unspecified: Secondary | ICD-10-CM | POA: Diagnosis not present

## 2024-03-13 DIAGNOSIS — D6832 Hemorrhagic disorder due to extrinsic circulating anticoagulants: Secondary | ICD-10-CM | POA: Diagnosis not present

## 2024-03-13 DIAGNOSIS — F419 Anxiety disorder, unspecified: Secondary | ICD-10-CM | POA: Diagnosis not present

## 2024-03-13 DIAGNOSIS — R918 Other nonspecific abnormal finding of lung field: Secondary | ICD-10-CM | POA: Diagnosis not present

## 2024-03-13 DIAGNOSIS — S79911A Unspecified injury of right hip, initial encounter: Secondary | ICD-10-CM | POA: Diagnosis not present

## 2024-03-13 DIAGNOSIS — R609 Edema, unspecified: Secondary | ICD-10-CM | POA: Diagnosis not present

## 2024-03-13 DIAGNOSIS — S76011A Strain of muscle, fascia and tendon of right hip, initial encounter: Secondary | ICD-10-CM | POA: Diagnosis not present

## 2024-03-13 DIAGNOSIS — S300XXA Contusion of lower back and pelvis, initial encounter: Secondary | ICD-10-CM | POA: Diagnosis not present

## 2024-03-13 DIAGNOSIS — G894 Chronic pain syndrome: Secondary | ICD-10-CM | POA: Diagnosis present

## 2024-03-13 DIAGNOSIS — F32A Depression, unspecified: Secondary | ICD-10-CM | POA: Diagnosis present

## 2024-03-13 DIAGNOSIS — Z90710 Acquired absence of both cervix and uterus: Secondary | ICD-10-CM

## 2024-03-13 DIAGNOSIS — T148XXA Other injury of unspecified body region, initial encounter: Secondary | ICD-10-CM | POA: Diagnosis present

## 2024-03-13 DIAGNOSIS — E871 Hypo-osmolality and hyponatremia: Secondary | ICD-10-CM | POA: Diagnosis not present

## 2024-03-13 DIAGNOSIS — Z7901 Long term (current) use of anticoagulants: Secondary | ICD-10-CM

## 2024-03-13 DIAGNOSIS — E222 Syndrome of inappropriate secretion of antidiuretic hormone: Secondary | ICD-10-CM | POA: Diagnosis present

## 2024-03-13 DIAGNOSIS — Z882 Allergy status to sulfonamides status: Secondary | ICD-10-CM | POA: Diagnosis not present

## 2024-03-13 DIAGNOSIS — Z86711 Personal history of pulmonary embolism: Secondary | ICD-10-CM | POA: Diagnosis not present

## 2024-03-13 DIAGNOSIS — Z66 Do not resuscitate: Secondary | ICD-10-CM | POA: Diagnosis present

## 2024-03-13 DIAGNOSIS — E538 Deficiency of other specified B group vitamins: Secondary | ICD-10-CM | POA: Diagnosis present

## 2024-03-13 DIAGNOSIS — R531 Weakness: Secondary | ICD-10-CM | POA: Diagnosis not present

## 2024-03-13 DIAGNOSIS — M25551 Pain in right hip: Secondary | ICD-10-CM | POA: Diagnosis not present

## 2024-03-13 DIAGNOSIS — D62 Acute posthemorrhagic anemia: Secondary | ICD-10-CM | POA: Diagnosis present

## 2024-03-13 DIAGNOSIS — E870 Hyperosmolality and hypernatremia: Secondary | ICD-10-CM | POA: Diagnosis not present

## 2024-03-13 DIAGNOSIS — M7981 Nontraumatic hematoma of soft tissue: Secondary | ICD-10-CM | POA: Diagnosis not present

## 2024-03-13 DIAGNOSIS — W19XXXA Unspecified fall, initial encounter: Secondary | ICD-10-CM | POA: Diagnosis not present

## 2024-03-13 DIAGNOSIS — Z79891 Long term (current) use of opiate analgesic: Secondary | ICD-10-CM | POA: Diagnosis not present

## 2024-03-13 DIAGNOSIS — D649 Anemia, unspecified: Secondary | ICD-10-CM

## 2024-03-13 DIAGNOSIS — Z96653 Presence of artificial knee joint, bilateral: Secondary | ICD-10-CM | POA: Diagnosis present

## 2024-03-13 DIAGNOSIS — Z881 Allergy status to other antibiotic agents status: Secondary | ICD-10-CM

## 2024-03-13 DIAGNOSIS — W010XXA Fall on same level from slipping, tripping and stumbling without subsequent striking against object, initial encounter: Secondary | ICD-10-CM | POA: Diagnosis present

## 2024-03-13 DIAGNOSIS — G8929 Other chronic pain: Secondary | ICD-10-CM | POA: Diagnosis present

## 2024-03-13 DIAGNOSIS — Z7401 Bed confinement status: Secondary | ICD-10-CM | POA: Diagnosis not present

## 2024-03-13 DIAGNOSIS — Z9049 Acquired absence of other specified parts of digestive tract: Secondary | ICD-10-CM

## 2024-03-13 DIAGNOSIS — I129 Hypertensive chronic kidney disease with stage 1 through stage 4 chronic kidney disease, or unspecified chronic kidney disease: Secondary | ICD-10-CM | POA: Diagnosis not present

## 2024-03-13 DIAGNOSIS — R2689 Other abnormalities of gait and mobility: Secondary | ICD-10-CM | POA: Diagnosis not present

## 2024-03-13 DIAGNOSIS — I700 Atherosclerosis of aorta: Secondary | ICD-10-CM | POA: Diagnosis not present

## 2024-03-13 DIAGNOSIS — Z880 Allergy status to penicillin: Secondary | ICD-10-CM

## 2024-03-13 DIAGNOSIS — R42 Dizziness and giddiness: Secondary | ICD-10-CM | POA: Diagnosis not present

## 2024-03-13 DIAGNOSIS — I959 Hypotension, unspecified: Secondary | ICD-10-CM | POA: Diagnosis not present

## 2024-03-13 DIAGNOSIS — G509 Disorder of trigeminal nerve, unspecified: Secondary | ICD-10-CM | POA: Diagnosis not present

## 2024-03-13 DIAGNOSIS — E861 Hypovolemia: Secondary | ICD-10-CM | POA: Diagnosis present

## 2024-03-13 DIAGNOSIS — M6281 Muscle weakness (generalized): Secondary | ICD-10-CM | POA: Diagnosis not present

## 2024-03-13 DIAGNOSIS — Z604 Social exclusion and rejection: Secondary | ICD-10-CM | POA: Diagnosis present

## 2024-03-13 DIAGNOSIS — I10 Essential (primary) hypertension: Secondary | ICD-10-CM | POA: Diagnosis present

## 2024-03-13 DIAGNOSIS — R110 Nausea: Secondary | ICD-10-CM | POA: Diagnosis not present

## 2024-03-13 DIAGNOSIS — Z9181 History of falling: Secondary | ICD-10-CM | POA: Diagnosis not present

## 2024-03-13 LAB — COMPREHENSIVE METABOLIC PANEL WITH GFR
ALT: 13 U/L (ref 0–44)
AST: 18 U/L (ref 15–41)
Albumin: 3.5 g/dL (ref 3.5–5.0)
Alkaline Phosphatase: 69 U/L (ref 38–126)
Anion gap: 11 (ref 5–15)
BUN: 22 mg/dL (ref 8–23)
CO2: 24 mmol/L (ref 22–32)
Calcium: 8.6 mg/dL — ABNORMAL LOW (ref 8.9–10.3)
Chloride: 89 mmol/L — ABNORMAL LOW (ref 98–111)
Creatinine, Ser: 1.26 mg/dL — ABNORMAL HIGH (ref 0.44–1.00)
GFR, Estimated: 41 mL/min — ABNORMAL LOW (ref >60)
Glucose, Bld: 127 mg/dL — ABNORMAL HIGH (ref 70–99)
Potassium: 4.1 mmol/L (ref 3.5–5.1)
Sodium: 124 mmol/L — ABNORMAL LOW (ref 135–145)
Total Bilirubin: 1.1 mg/dL (ref 0.0–1.2)
Total Protein: 6 g/dL — ABNORMAL LOW (ref 6.5–8.1)

## 2024-03-13 LAB — CBC
HCT: 22.1 % — ABNORMAL LOW (ref 36.0–46.0)
HCT: 27.8 % — ABNORMAL LOW (ref 36.0–46.0)
Hemoglobin: 7.4 g/dL — ABNORMAL LOW (ref 12.0–15.0)
Hemoglobin: 9.5 g/dL — ABNORMAL LOW (ref 12.0–15.0)
MCH: 31 pg (ref 26.0–34.0)
MCH: 31.4 pg (ref 26.0–34.0)
MCHC: 33.5 g/dL (ref 30.0–36.0)
MCHC: 34.2 g/dL (ref 30.0–36.0)
MCV: 91.7 fL (ref 80.0–100.0)
MCV: 92.5 fL (ref 80.0–100.0)
Platelets: 183 10*3/uL (ref 150–400)
Platelets: 238 10*3/uL (ref 150–400)
RBC: 2.39 MIL/uL — ABNORMAL LOW (ref 3.87–5.11)
RBC: 3.03 MIL/uL — ABNORMAL LOW (ref 3.87–5.11)
RDW: 12.3 % (ref 11.5–15.5)
RDW: 12.4 % (ref 11.5–15.5)
WBC: 10.1 10*3/uL (ref 4.0–10.5)
WBC: 11.6 10*3/uL — ABNORMAL HIGH (ref 4.0–10.5)
nRBC: 0 % (ref 0.0–0.2)
nRBC: 0 % (ref 0.0–0.2)

## 2024-03-13 LAB — URINALYSIS, ROUTINE W REFLEX MICROSCOPIC
Bilirubin Urine: NEGATIVE
Glucose, UA: NEGATIVE mg/dL
Hgb urine dipstick: NEGATIVE
Ketones, ur: NEGATIVE mg/dL
Leukocytes,Ua: NEGATIVE
Nitrite: NEGATIVE
Protein, ur: NEGATIVE mg/dL
Specific Gravity, Urine: 1.016 (ref 1.005–1.030)
pH: 6 (ref 5.0–8.0)

## 2024-03-13 LAB — CBG MONITORING, ED: Glucose-Capillary: 161 mg/dL — ABNORMAL HIGH (ref 70–99)

## 2024-03-13 LAB — SODIUM, URINE, RANDOM: Sodium, Ur: <10 mmol/L

## 2024-03-13 MED ORDER — SENNOSIDES-DOCUSATE SODIUM 8.6-50 MG PO TABS
1.0000 | ORAL_TABLET | Freq: Every evening | ORAL | Status: DC | PRN
  Administered 2024-03-14 – 2024-03-15 (×2): 1 via ORAL
  Filled 2024-03-13 (×2): qty 1

## 2024-03-13 MED ORDER — ONDANSETRON HCL 4 MG/2ML IJ SOLN: 4.0000 mg | Freq: Four times a day (QID) | INTRAMUSCULAR | Status: DC | PRN

## 2024-03-13 MED ORDER — PANTOPRAZOLE SODIUM 40 MG PO TBEC
40.0000 mg | DELAYED_RELEASE_TABLET | Freq: Every day | ORAL | Status: DC
  Administered 2024-03-14 – 2024-03-17 (×4): 40 mg via ORAL
  Filled 2024-03-13 (×4): qty 1

## 2024-03-13 MED ORDER — SODIUM CHLORIDE 0.9 % IV BOLUS
500.0000 mL | Freq: Once | INTRAVENOUS | Status: AC
  Administered 2024-03-13: 500 mL via INTRAVENOUS

## 2024-03-13 MED ORDER — HYDROMORPHONE HCL 2 MG PO TABS
2.0000 mg | ORAL_TABLET | Freq: Three times a day (TID) | ORAL | Status: DC | PRN
  Administered 2024-03-14 – 2024-03-16 (×6): 2 mg via ORAL
  Filled 2024-03-13 (×6): qty 1

## 2024-03-13 MED ORDER — SODIUM CHLORIDE 0.9% FLUSH
3.0000 mL | Freq: Two times a day (BID) | INTRAVENOUS | Status: DC
  Administered 2024-03-13 – 2024-03-17 (×8): 3 mL via INTRAVENOUS

## 2024-03-13 MED ORDER — SODIUM CHLORIDE 0.9 % IV BOLUS
500.0000 mL | Freq: Once | INTRAVENOUS | Status: AC
Start: 2024-03-13 — End: 2024-03-13
  Administered 2024-03-13: 500 mL via INTRAVENOUS

## 2024-03-13 MED ORDER — HYDROXYZINE HCL 25 MG PO TABS
25.0000 mg | ORAL_TABLET | Freq: Every evening | ORAL | Status: DC | PRN
  Administered 2024-03-13 – 2024-03-15 (×3): 25 mg via ORAL
  Filled 2024-03-13 (×3): qty 1

## 2024-03-13 MED ORDER — ACETAMINOPHEN 650 MG RE SUPP: 650.0000 mg | Freq: Four times a day (QID) | RECTAL | Status: DC | PRN

## 2024-03-13 MED ORDER — ACETAMINOPHEN 325 MG PO TABS
650.0000 mg | ORAL_TABLET | Freq: Four times a day (QID) | ORAL | Status: DC | PRN
  Administered 2024-03-13 – 2024-03-16 (×6): 650 mg via ORAL
  Filled 2024-03-13 (×6): qty 2

## 2024-03-13 MED ORDER — OXYCODONE-ACETAMINOPHEN 5-325 MG PO TABS
1.0000 | ORAL_TABLET | Freq: Once | ORAL | Status: AC
  Administered 2024-03-13: 1 via ORAL
  Filled 2024-03-13: qty 1

## 2024-03-13 MED ORDER — VENLAFAXINE HCL ER 37.5 MG PO CP24
37.5000 mg | ORAL_CAPSULE | Freq: Every day | ORAL | Status: DC
  Administered 2024-03-14 – 2024-03-17 (×4): 37.5 mg via ORAL
  Filled 2024-03-13 (×4): qty 1

## 2024-03-13 MED ORDER — FENTANYL CITRATE PF 50 MCG/ML IJ SOSY
50.0000 ug | PREFILLED_SYRINGE | Freq: Once | INTRAMUSCULAR | Status: AC
  Administered 2024-03-13: 50 ug via INTRAVENOUS
  Filled 2024-03-13: qty 1

## 2024-03-13 MED ORDER — IOHEXOL 300 MG/ML  SOLN
75.0000 mL | Freq: Once | INTRAMUSCULAR | Status: AC | PRN
  Administered 2024-03-13: 75 mL via INTRAVENOUS

## 2024-03-13 MED ORDER — ONDANSETRON HCL 4 MG PO TABS: 4.0000 mg | ORAL_TABLET | Freq: Four times a day (QID) | ORAL | Status: DC | PRN

## 2024-03-13 NOTE — ED Provider Notes (Signed)
  EMERGENCY DEPARTMENT AT [facility] Provider Note   CSN: [id number] Arrival date & time: 03/13/24  1109     History  Chief Complaint  Patient presents with   Hip Pain   Dizziness   Hypotension    Kristen [name] is a 89 y.o. female.   Hip Pain  Dizziness Patient presents with hip pain and dizziness.  Had a fall 2 days ago.  Lost her balance and fell on her right hip.  Previous right hip replacement.  Since then has felt dizzy lightheaded.  States she did hit her head at the time.  States that she could pass out.  No headache.  No confusion.  Not on blood thinners.  Previous hip replacement back in January.  Reported swelling in right buttock area.  Was in PCPs office and found to be hypotensive.  Has had somewhat decreased oral intake.     Home Medications Prior to Admission medications   Medication Sig Start Date End Date Taking? Authorizing Provider  cyanocobalamin  (,VITAMIN B-12,) 1000 MCG/ML injection Inject 100 mcg into the muscle every 30 (thirty) days. 11/02/19   [provider]  diclofenac  Sodium (VOLTAREN ) 1 % GEL Apply 4 g topically 4 (four) times daily as needed (For pain).    [provider]  docusate sodium  (COLACE) 50 MG capsule Take 50 mg by mouth daily as needed for mild constipation. Patient not taking: Reported on 02/21/2024    [provider]  ELIQUIS  5 MG TABS tablet Take 5 mg by mouth 2 (two) times daily. 09/28/22   [provider]  esomeprazole (NEXIUM) 40 MG capsule Take 40 mg by mouth daily.    [provider]  HYDROmorphone  (DILAUDID ) 2 MG tablet Take 1 tablet (2 mg total) by mouth in the morning and at bedtime. Patient taking differently: Take 2 mg by mouth 3 (three) times daily as needed for moderate pain (pain score 4-6) or severe pain (pain score 7-10). 11/04/23   Doroteo Gasmen, MD  hydrOXYzine  (ATARAX ) 25 MG tablet Take 25 mg by mouth at bedtime.    [provider]   hydrOXYzine  (VISTARIL ) 25 MG capsule Take 25 mg by mouth at bedtime.    [provider]  ipratropium (ATROVENT ) 0.06 % nasal spray Place 1 spray into both nostrils daily as needed for rhinitis.    [provider]  levofloxacin  (LEVAQUIN ) 500 MG tablet Take 1 tablet (500 mg total) by mouth daily. 03/06/24   Cheyenne Cotta, MD  losartan  (COZAAR ) 25 MG tablet Take 25 mg by mouth daily.    [provider]  metoCLOPramide  (REGLAN ) 5 MG tablet Take 1 tablet (5 mg total) by mouth 3 (three) times daily before meals. Patient not taking: Reported on 02/21/2024 12/30/23   True Fuss, MD  ondansetron  (ZOFRAN ) 8 MG tablet Take 16 mg by mouth 3 (three) times daily as needed for nausea or vomiting.    [provider]  prochlorperazine (COMPAZINE) 5 MG tablet Take 5 mg by mouth every 6 (six) hours as needed for vomiting or nausea. 12/22/23   [provider]  Syringe/Needle, Disp, (SYRINGE 3CC/25GX1-1/2") 25G X 1-1/2" 3 ML MISC 1 each by Does not apply route every 30 (thirty) days. For B12 injection    [provider]      Allergies    Penicillins, Tetracyclines & related, and Sulfa antibiotics    Review of Systems   Review of Systems  Neurological:  Positive for dizziness.  Physical Exam Updated Vital Signs BP (!) 152/91   Pulse (!) 109   Temp 98.3 F (36.8 C) (Oral)   Resp (!) 21   Ht 5\' 2"  (1.575 m)   Wt 49.9 kg   LMP  (LMP Unknown)   SpO2 96%   BMI 20.12 kg/m  Physical Exam Vitals and nursing note reviewed.  HENT:     Head: Atraumatic.  Cardiovascular:     Rate and Rhythm: Regular rhythm.  Abdominal:     Tenderness: There is abdominal tenderness.  Musculoskeletal:        General: Tenderness present.     Cervical back: Neck supple.     Comments: Tenderness to right hip laterally.  Pain with some swelling posteriorly to the hip.  No clear ecchymosis.  Sensation intact in the foot.  Skin:    Capillary Refill: Capillary refill  takes less than 2 seconds.     Coloration: Skin is pale.  Neurological:     Mental Status: She is alert. Mental status is at baseline.     ED Results / Procedures / Treatments   Labs (all labs ordered are listed, but only abnormal results are displayed) Labs Reviewed  COMPREHENSIVE METABOLIC PANEL WITH GFR - Abnormal; Notable for the following components:      Result Value   Sodium 124 (*)    Chloride 89 (*)    Glucose, Bld 127 (*)    Creatinine, Ser 1.26 (*)    Calcium  8.6 (*)    Total Protein 6.0 (*)    GFR, Estimated 41 (*)    All other components within normal limits  CBC - Abnormal; Notable for the following components:   WBC 11.6 (*)    RBC 3.03 (*)    Hemoglobin 9.5 (*)    HCT 27.8 (*)    All other components within normal limits  CBG MONITORING, ED - Abnormal; Notable for the following components:   Glucose-Capillary 161 (*)    All other components within normal limits  URINALYSIS, ROUTINE W REFLEX MICROSCOPIC    EKG None  Radiology DG Hip Unilat  With Pelvis 2-3 Views Right Result Date: 03/13/2024 CLINICAL DATA:  Status post fall with right hip pain EXAM: DG HIP (WITH OR WITHOUT PELVIS) 3V RIGHT COMPARISON:  Radiograph of the right hips dated 12/09/2023 FINDINGS: Postsurgical changes of right hip arthroplasty. Hardware appears intact and well seated. There is no evidence of hip fracture or dislocation. IMPRESSION: Postsurgical changes of right hip arthroplasty without evidence of acute fracture or dislocation. Electronically Signed   By: Limin  Xu M.D.   On: 03/13/2024 13:44    Procedures Procedures    Medications Ordered in ED Medications  sodium chloride  0.9 % bolus 500 mL (0 mLs Intravenous Stopped 03/13/24 1405)  fentaNYL  (SUBLIMAZE ) injection 50 mcg (50 mcg Intravenous Given 03/13/24 1315)    ED Course/ Medical Decision Making/ A&P                                 Medical Decision Making Amount and/or Complexity of Data Reviewed Labs:  ordered. Radiology: ordered.  Risk Prescription drug management.   Patient presents after fall.  Now lightheaded and dizzy.  Appears pale.  Patient's caregiver is with him and states she is more pale.  Does have some abdominal tenderness but reported not here abdomen.  Does have swelling posterior in the buttock area.  Potential hematoma.  Will get x-ray  and blood work to start with.  Infection felt less likely.  Has anemia.  Mildly decreased hemoglobin compared to prior.  Will get CT scan to evaluate for potential hematoma in buttocks.  Also with recent pneumonia will get CT scan of the chest.   Also hyponatremia on blood work and fluid bolus been given.  Care turned over to Dr. Linder Revere.        Final Clinical Impression(s) / ED Diagnoses Final diagnoses:  Anemia, unspecified type  Hyponatremia    Rx / DC Orders ED Discharge Orders     None         Mozell Arias, MD 03/13/24 [phone]

## 2024-03-13 NOTE — ED Notes (Addendum)
 Kristen [name]

## 2024-03-13 NOTE — H&P (Signed)
 History and Physical    Dann Ventress ZOX:[id number] DOB: Mar 07, 1933 DOA: 03/13/2024  PCP: Benedetta Bradley, MD   Coming from: [facility]   Chief Complaint: Right hip pain  HPI: Kristen [name] is a 89 y.o. female with medical history significant for COPD, chronic pain, hypertension, hyperlipidemia, depression, anxiety, chronic hyponatremia, lung nodules concerning for adenocarcinoma, and history of PE on Eliquis  who presents with right hip pain after a fall 2 days ago.  Patient fell onto her right hip 2 days ago and has had increasing pain and swelling since then.  She had been experiencing lightheadedness and near syncope earlier, but denies that now.  She states that the food at her ILF is awful and she has not been eating much lately due to this.  She had been experiencing severe nausea for a few months, but denies any nausea, vomiting, or diarrhea over the past couple weeks.  ED Course: Upon arrival to the ED, patient is found to be afebrile and saturating well on room air with mild tachycardia and SBP 88 and greater.  Labs are most notable for sodium 124, creatinine 1.26, WBC 11,600, hemoglobin 9.5, and urine sodium less than 10.  CT of the pelvis reveals 7.5 cm suspected intramuscular hematoma within the right gluteus with question of small focus of acute bleeding.  Chest CT reveals infectious or inflammatory changes in the left upper lobe, and right lung findings suspicious for neoplasm.  Orthopedic surgery (Dr. Bernard Brick) was consulted by the ED physician and recommended holding Eliquis .  Patient was given a liter of normal saline, Percocet, and fentanyl  in the ED.  Review of Systems:  All other systems reviewed and apart from HPI, are negative.  Past Medical History:  Diagnosis Date   Anemia    Arthritis    B12 deficiency    Chronic kidney disease    Chronic pain disorder    trigeminal neuralgia   Depression    Dyslipidemia    Hypertension    PONV (postoperative nausea and vomiting)     Seizure disorder (HCC)    decades since last seizure   Tremor    Trigeminal nerve disorder     Past Surgical History:  Procedure Laterality Date   ABDOMINAL HYSTERECTOMY     BLADDER REPAIR     tack   CATARACT EXTRACTION     CHOLECYSTECTOMY     ESOPHAGOGASTRODUODENOSCOPY N/A 12/30/2023   Procedure: EGD (ESOPHAGOGASTRODUODENOSCOPY);  Surgeon: Renaye Carp, DO;  Location: [facility];  Service: Gastroenterology;  Laterality: N/A;   gamma knife  2004   NOSE SURGERY     for fracture   SHOULDER ARTHROSCOPY DISTAL CLAVICLE EXCISION AND OPEN ROTATOR CUFF REPAIR Left    rotary cuff repair   TONSILLECTOMY     TOTAL HIP ARTHROPLASTY Right 10/30/2023   Procedure: TOTAL HIP ARTHROPLASTY ANTERIOR APPROACH;  Surgeon: Claiborne Crew, MD;  Location: [facility] OR;  Service: Orthopedics;  Laterality: Right;   TOTAL KNEE ARTHROPLASTY Bilateral    UMBILICAL HERNIA REPAIR      Social History:   reports that she has never smoked. She has never been exposed to tobacco smoke. She has never used smokeless tobacco. She reports that she does not currently use alcohol. She reports that she does not use drugs.  Allergies  Allergen Reactions   Penicillins Hives and Rash    Other reaction(s): Other unknown    Tetracyclines & Related Anaphylaxis    Other reaction(s): Other unknown    Sulfa Antibiotics Hives and Other (  See Comments)    unknown    History reviewed. No pertinent family history.   Prior to Admission medications   Medication Sig Start Date End Date Taking? Authorizing Provider  cyanocobalamin  (,VITAMIN B-12,) 1000 MCG/ML injection Inject 100 mcg into the muscle every 30 (thirty) days. 11/02/19  Yes [provider]  desvenlafaxine (PRISTIQ) 25 MG 24 hr tablet Take 25 mg by mouth every morning. 02/29/24  Yes [provider]  diclofenac  Sodium (VOLTAREN ) 1 % GEL Apply 4 g topically 4 (four) times daily as needed (For pain).   Yes [provider]  ELIQUIS  5 MG TABS  tablet Take 5 mg by mouth 2 (two) times daily. 09/28/22  Yes [provider]  FEROSUL 325 (65 Fe) MG tablet Take 325 mg by mouth every other day. 02/29/24  Yes [provider]  HYDROmorphone  (DILAUDID ) 2 MG tablet Take 1 tablet (2 mg total) by mouth in the morning and at bedtime. Patient taking differently: Take 2 mg by mouth in the morning and at bedtime. 1 tab in the AM and 2 tabs in the PM 11/04/23  Yes Ogbata, Juna Oka, MD  hydrOXYzine  (VISTARIL ) 25 MG capsule Take 25 mg by mouth at bedtime.   Yes [provider]  lidocaine  (XYLOCAINE ) 2 % solution Use as directed 15 mLs in the mouth or throat 2 (two) times daily as needed for mouth pain. 03/10/24  Yes [provider]  losartan  (COZAAR ) 100 MG tablet Take 100 mg by mouth daily. 02/29/24  Yes [provider]  ondansetron  (ZOFRAN ) 8 MG tablet Take 16 mg by mouth 3 (three) times daily as needed for nausea or vomiting.   Yes [provider]  prochlorperazine (COMPAZINE) 5 MG tablet Take 5 mg by mouth every 6 (six) hours as needed for vomiting or nausea. 12/22/23  Yes [provider]  sucralfate  (CARAFATE ) 1 g tablet Take 1 g by mouth 2 (two) times daily. 03/04/24  Yes [provider]  docusate sodium  (COLACE) 50 MG capsule Take 50 mg by mouth daily as needed for mild constipation. Patient not taking: Reported on 02/21/2024    [provider]  esomeprazole (NEXIUM) 40 MG capsule Take 40 mg by mouth daily.    [provider]  hydrOXYzine  (ATARAX ) 25 MG tablet Take 25 mg by mouth at bedtime.    [provider]  ipratropium (ATROVENT ) 0.06 % nasal spray Place 1 spray into both nostrils daily as needed for rhinitis.    [provider]  levofloxacin  (LEVAQUIN ) 500 MG tablet Take 1 tablet (500 mg total) by mouth daily. Patient not taking: Reported on 03/13/2024 03/06/24   Zammit, Joseph, MD  losartan  (COZAAR ) 25 MG tablet Take 25 mg by mouth daily.     [provider]  metoCLOPramide  (REGLAN ) 5 MG tablet Take 1 tablet (5 mg total) by mouth 3 (three) times daily before meals. Patient not taking: Reported on 02/21/2024 12/30/23   True Fuss, MD  Syringe/Needle, Disp, (SYRINGE 3CC/25GX1-1/2") 25G X 1-1/2" 3 ML MISC 1 each by Does not apply route every 30 (thirty) days. For B12 injection    [provider]    Physical Exam: Vitals:   03/13/24 1911 03/13/24 2100 03/13/24 2241 03/13/24 2300  BP: 123/80 (!) 152/59  124/64  Pulse: 71 72  72  Resp: 18 (!) 22  (!) 22  Temp:   98.1 F (36.7 C)   TempSrc:   Oral   SpO2: 100% 99%  96%  Weight:  Height:        Constitutional: NAD, no diaphoresis   Eyes: PERTLA, lids and conjunctivae normal ENMT: Mucous membranes are moist. Posterior pharynx clear of any exudate or lesions.   Neck: supple, no masses  Respiratory:  no wheezing, no crackles. No accessory muscle use.  Cardiovascular: S1 & S2 heard, regular rate and rhythm. No JVD. Abdomen: No tenderness, soft. Bowel sounds active.  Musculoskeletal: no clubbing / cyanosis. No joint deformity upper and lower extremities.   Skin: no significant rashes, lesions, ulcers. Warm, dry, well-perfused. Neurologic: CN 2-12 grossly intact. Moving all extremities. Alert and oriented.  Psychiatric: Calm. Cooperative.    Labs and Imaging on Admission: I have personally reviewed following labs and imaging studies  CBC: Recent Labs  Lab 03/13/24 1253  WBC 11.6*  HGB 9.5*  HCT 27.8*  MCV 91.7  PLT 238   Basic Metabolic Panel: Recent Labs  Lab 03/13/24 1253  NA 124*  K 4.1  CL 89*  CO2 24  GLUCOSE 127*  BUN 22  CREATININE 1.26*  CALCIUM  8.6*   GFR: Estimated Creatinine Clearance: 24.3 mL/min (A) (by C-G formula based on SCr of 1.26 mg/dL (H)). Liver Function Tests: Recent Labs  Lab 03/13/24 1253  AST 18  ALT 13  ALKPHOS 69  BILITOT 1.1  PROT 6.0*  ALBUMIN  3.5   No results for input(s): "LIPASE", "AMYLASE"  in the last 168 hours. No results for input(s): "AMMONIA" in the last 168 hours. Coagulation Profile: No results for input(s): "INR", "PROTIME" in the last 168 hours. Cardiac Enzymes: No results for input(s): "CKTOTAL", "CKMB", "CKMBINDEX", "TROPONINI" in the last 168 hours. BNP (last 3 results) No results for input(s): "PROBNP" in the last 8760 hours. HbA1C: No results for input(s): "HGBA1C" in the last 72 hours. CBG: Recent Labs  Lab 03/13/24 1141  GLUCAP 161*   Lipid Profile: No results for input(s): "CHOL", "HDL", "LDLCALC", "TRIG", "CHOLHDL", "LDLDIRECT" in the last 72 hours. Thyroid  Function Tests: No results for input(s): "TSH", "T4TOTAL", "FREET4", "T3FREE", "THYROIDAB" in the last 72 hours. Anemia Panel: No results for input(s): "VITAMINB12", "FOLATE", "FERRITIN", "TIBC", "IRON", "RETICCTPCT" in the last 72 hours. Urine analysis:    Component Value Date/Time   COLORURINE YELLOW 03/13/2024 1357   APPEARANCEUR CLEAR 03/13/2024 1357   LABSPEC 1.016 03/13/2024 1357   PHURINE 6.0 03/13/2024 1357   GLUCOSEU NEGATIVE 03/13/2024 1357   HGBUR NEGATIVE 03/13/2024 1357   BILIRUBINUR NEGATIVE 03/13/2024 1357   KETONESUR NEGATIVE 03/13/2024 1357   PROTEINUR NEGATIVE 03/13/2024 1357   NITRITE NEGATIVE 03/13/2024 1357   LEUKOCYTESUR NEGATIVE 03/13/2024 1357   Sepsis Labs: @LABRCNTIP (procalcitonin:4,lacticidven:4) )No results found for this or any previous visit (from the past 240 hours).   Radiological Exams on Admission: CT Chest Wo Contrast Result Date: 03/13/2024 CLINICAL DATA:  Pneumonia complication. EXAM: CT CHEST WITHOUT CONTRAST TECHNIQUE: Multidetector CT imaging of the chest was performed following the standard protocol without IV contrast. RADIATION DOSE REDUCTION: This exam was performed according to the departmental dose-optimization program which includes automated exposure control, adjustment of the mA and/or kV according to patient size and/or use of iterative  reconstruction technique. COMPARISON:  Chest x-ray 03/06/2024. FINDINGS: Cardiovascular: No significant vascular findings. Normal heart size. No pericardial effusion. There are atherosclerotic calcifications of the aorta. Mediastinum/Nodes: No enlarged mediastinal or axillary lymph nodes. Thyroid  gland, trachea, and esophagus demonstrate no significant findings. Lungs/Pleura: There are minimal clustered tree-in-bud opacities in the left lower lobe. There is focal ill-defined nodular density with surrounding ground-glass opacity in  the right upper lobe measuring 10 x 7 by 7 mm with some adjacent pleural thickening/tethering. There is some scarring in the lingula. There is no pleural effusion or pneumothorax. There is a calcified granuloma in the left upper lobe. There is a noncalcified 3 mm nodule in the right upper lobe image 4/77. There is a noncalcified nodule in the right lower lobe measuring 5 mm image 4/104. Upper Abdomen: No acute abnormality. Cholecystectomy clips are present. There is a 2.3 cm cyst in the left kidney. Musculoskeletal: Degenerative changes affect the spine. IMPRESSION: 1. Minimal clustered tree-in-bud opacities in the left lower lobe compatible with infectious/inflammatory process. 2. Ill-defined nodular density with surrounding ground-glass opacity in the right upper lobe measuring up to 10 mm. This may be infectious/inflammatory, but neoplasm is not excluded. Consider one of the following in 3 months for both low-risk and high-risk individuals: (a) repeat chest CT, (b) follow-up PET-CT, or (c) tissue sampling. This recommendation follows the consensus statement: Guidelines for Management of Incidental Pulmonary Nodules Detected on CT Images: From the Fleischner Society 2017; Radiology 2017; 284:228-243. 3. Additional small right lung nodules measuring up to 5 mm. 4. Aortic atherosclerosis. Aortic Atherosclerosis (ICD10-I70.0). Electronically Signed   By: Tyron Gallon M.D.   On: 03/13/2024  21:28   CT PELVIS W CONTRAST Result Date: 03/13/2024 CLINICAL DATA:  Anemia low blood pressure right-sided hip pain EXAM: CT PELVIS WITH CONTRAST TECHNIQUE: Multidetector CT imaging of the pelvis was performed using the standard protocol following the bolus administration of intravenous contrast. RADIATION DOSE REDUCTION: This exam was performed according to the departmental dose-optimization program which includes automated exposure control, adjustment of the mA and/or kV according to patient size and/or use of iterative reconstruction technique. CONTRAST:  75mL OMNIPAQUE  IOHEXOL  300 MG/ML  SOLN COMPARISON:  CT 10/28/2023, 11/23/2021 FINDINGS: Urinary Tract:  Partially obscured by right hip hardware artifact Bowel:  No acute bowel wall thickening Vascular/Lymphatic: Aortic atherosclerosis. No aneurysm. No suspicious lymph nodes Reproductive: Hysterectomy. Curvilinear density at the level of vaginal cuff is a chronic finding and presumably related to postsurgical change. Other:  Negative for pelvic effusion or free air. Musculoskeletal: Right hip replacement with artifact. No acute fracture is seen. Edema within the subcutaneous soft tissues of the right posterior hip and gluteal region. Asymmetrical enlargement of the right gluteus muscles compared to the left, with oval slightly dense masslike area measuring 7.5 x 5.2 cm, consistent with intramuscular hematoma. Small focus of globular contrast, series 2, image 67. Some edema and asymmetrical enlargement of the right abductor muscles as well. IMPRESSION: 1. Right hip replacement with artifact. No acute osseous abnormality. 2. 7.5 cm suspected intramuscular hematoma within the right gluteus muscles with small focus of globular contrast, possible small focus of active bleeding. Edema within the subcutaneous soft tissues of the right posterolateral hip and gluteal region. 3. Aortic atherosclerosis. Aortic Atherosclerosis (ICD10-I70.0). Electronically Signed   By:  Esmeralda Hedge M.D.   On: 03/13/2024 21:14   DG Hip Unilat  With Pelvis 2-3 Views Right Result Date: 03/13/2024 CLINICAL DATA:  Status post fall with right hip pain EXAM: DG HIP (WITH OR WITHOUT PELVIS) 3V RIGHT COMPARISON:  Radiograph of the right hips dated 12/09/2023 FINDINGS: Postsurgical changes of right hip arthroplasty. Hardware appears intact and well seated. There is no evidence of hip fracture or dislocation. IMPRESSION: Postsurgical changes of right hip arthroplasty without evidence of acute fracture or dislocation. Electronically Signed   By: Limin  Xu M.D.   On: 03/13/2024 13:44  EKG: Independently reviewed. Sinus rhythm, PAC.   Assessment/Plan   1. Intramuscular hematoma; acute anemia  - Orthopedic surgery consulted by ED physician  - Hold Eliquis , follow serial CBCs, transfuse as-needed     2. Hyponatremia  - Serum sodium is 124 in setting of hypovolemia; she does not have any severe symptoms - Restrict free-water intake, continue isotonic IVF, follow serial chem panels   3. Hx of PE  - Hold Eliquis  for now    4. Lung nodules  - Adenocarcinoma is suspected, followed by pulmonology in outpatient setting    5. Hypertension  - BP low in ED and losartan  is held    6. CKD 3A  - SCr is increased in setting of hypovolemia    - Continue IVF hydration, renally-dose medications, repeat chem panel in am   7. Depression, anxiety  - Pristiq, hydroxyzine      DVT prophylaxis: SCDs  Code Status: DNR  Level of Care: Level of care: Progressive Family Communication: None present   Disposition Plan:  Anticipated d/c is to: TBD Anticipated d/c date is: 03/16/24  Patient currently: pending stable H&H, improved sodium, stable BP  Consults called: orthopedic surgery  Admission status: Inpatient     Walton Guppy, MD [facility]  03/13/2024, 11:35 PM

## 2024-03-13 NOTE — ED Triage Notes (Signed)
 Pt arrives via POV. Pt was seen by her PCP for a follow up. At the office, patient's blood pressure was 78/56. Pt arrives AxOx4. States she is dizzy and feels like she might pass out.  Pt reports she had a fall this past Saturday after using the restroom. She reports she did hit her head. No loc and no blood thinners. Pt does have right hip pain since the fall. Hip surgery back in January.

## 2024-03-14 DIAGNOSIS — T148XXA Other injury of unspecified body region, initial encounter: Secondary | ICD-10-CM

## 2024-03-14 DIAGNOSIS — E871 Hypo-osmolality and hyponatremia: Principal | ICD-10-CM

## 2024-03-14 LAB — PROCALCITONIN: Procalcitonin: <0.1 ng/mL

## 2024-03-14 LAB — CBC
HCT: 22 % — ABNORMAL LOW (ref 36.0–46.0)
Hemoglobin: 7.4 g/dL — ABNORMAL LOW (ref 12.0–15.0)
MCH: 31.8 pg (ref 26.0–34.0)
MCHC: 33.6 g/dL (ref 30.0–36.0)
MCV: 94.4 fL (ref 80.0–100.0)
Platelets: 191 10*3/uL (ref 150–400)
RBC: 2.33 MIL/uL — ABNORMAL LOW (ref 3.87–5.11)
RDW: 12.6 % (ref 11.5–15.5)
WBC: 8 10*3/uL (ref 4.0–10.5)
nRBC: 0 % (ref 0.0–0.2)

## 2024-03-14 LAB — BASIC METABOLIC PANEL WITH GFR
Anion gap: 10 (ref 5–15)
Anion gap: 7 (ref 5–15)
BUN: 20 mg/dL (ref 8–23)
BUN: 23 mg/dL (ref 8–23)
CO2: 22 mmol/L (ref 22–32)
CO2: 24 mmol/L (ref 22–32)
Calcium: 8 mg/dL — ABNORMAL LOW (ref 8.9–10.3)
Calcium: 8.3 mg/dL — ABNORMAL LOW (ref 8.9–10.3)
Chloride: 95 mmol/L — ABNORMAL LOW (ref 98–111)
Chloride: 97 mmol/L — ABNORMAL LOW (ref 98–111)
Creatinine, Ser: 1.09 mg/dL — ABNORMAL HIGH (ref 0.44–1.00)
Creatinine, Ser: 1.13 mg/dL — ABNORMAL HIGH (ref 0.44–1.00)
GFR, Estimated: 47 mL/min — ABNORMAL LOW (ref >60)
GFR, Estimated: 49 mL/min — ABNORMAL LOW (ref >60)
Glucose, Bld: 113 mg/dL — ABNORMAL HIGH (ref 70–99)
Glucose, Bld: 115 mg/dL — ABNORMAL HIGH (ref 70–99)
Potassium: 3.8 mmol/L (ref 3.5–5.1)
Potassium: 3.9 mmol/L (ref 3.5–5.1)
Sodium: 126 mmol/L — ABNORMAL LOW (ref 135–145)
Sodium: 129 mmol/L — ABNORMAL LOW (ref 135–145)

## 2024-03-14 LAB — OSMOLALITY, URINE: Osmolality, Ur: 380 mosm/kg (ref 300–900)

## 2024-03-14 LAB — PREPARE RBC (CROSSMATCH)

## 2024-03-14 MED ORDER — SODIUM CHLORIDE 0.9% IV SOLUTION: Freq: Once | INTRAVENOUS | Status: AC

## 2024-03-14 MED ORDER — ENSURE ENLIVE PO LIQD
237.0000 mL | Freq: Two times a day (BID) | ORAL | Status: DC
  Administered 2024-03-14 – 2024-03-17 (×7): 237 mL via ORAL

## 2024-03-14 MED ORDER — SODIUM CHLORIDE 0.9 % IV SOLN: INTRAVENOUS | Status: DC

## 2024-03-14 MED ORDER — MELATONIN 3 MG PO TABS
3.0000 mg | ORAL_TABLET | Freq: Every evening | ORAL | Status: DC | PRN
  Administered 2024-03-14 – 2024-03-16 (×4): 3 mg via ORAL
  Filled 2024-03-14 (×4): qty 1

## 2024-03-14 NOTE — Plan of Care (Signed)

## 2024-03-14 NOTE — Progress Notes (Signed)
 PROGRESS NOTE    Kristen [name]  BJY:[id number] DOB: 07-03-1933 DOA: 03/13/2024 PCP: Benedetta Bradley, MD    Brief Narrative:  Kristen [name] is a 89 y.o. female with medical history significant for COPD, chronic pain, hypertension, hyperlipidemia, depression, anxiety, chronic hyponatremia, lung nodules concerning for adenocarcinoma, and history of PE on Eliquis  who presents with right hip pain after a fall 2 days ago.   Patient fell onto her right hip 2 days ago and has had increasing pain and swelling since then.  She had been experiencing lightheadedness and near syncope earlier, but denies that now.      Assessment and Plan: Intramuscular hematoma; acute anemia  - Orthopedic surgery consulted by ED physician-Dr. Deatra Face per chart review - Hold Eliquis , follow serial CBCs-baseline hemoglobin is around 12 a month ago and patient is symptomatic so will transfuse a unit on 5/20 and continue to monitor -PT eval as patient had a fall at home    Hyponatremia  - Serum sodium is 124 in setting of hypovolemia; she does not have any severe symptoms -Trending up, continue to monitor   Hx of PE  - Hold Eliquis  for now due to bleeding   Lung nodules  - Adenocarcinoma is suspected, followed by pulmonology in outpatient setting   -see CT scan below   History of hypertension  - BP low in ED and losartan  is held-resume as able   CKD 3A  - SCr is increased in setting of hypovolemia    - Trending down after IV fluids    Depression, anxiety  - Pristiq, hydroxyzine          DVT prophylaxis: SCDs Start: 03/13/24 2333    Code Status: Limited: Do not attempt resuscitation (DNR) -DNR-LIMITED -Do Not Intubate/DNI  Family Communication: Daughter on phone  Disposition Plan:  Level of care: Progressive Status is: Inpatient     Consultants:  Orthopedics:[facility]   Subjective: Asking if she is going home today  Objective: Vitals:   03/14/24 0200 03/14/24 0350 03/14/24 0400 03/14/24 0815   BP: 132/61 (!) 131/59  (!) 122/48  Pulse: 73 64  68  Resp: 12 19 17 18   Temp:  98 F (36.7 C)  97.7 F (36.5 C)  TempSrc:  Oral  Oral  SpO2: 96% 100%  100%  Weight:      Height:        Intake/Output Summary (Last 24 hours) at 03/14/2024 1152 Last data filed at 03/14/2024 0600 Gross per 24 hour  Intake 1112.44 ml  Output --  Net 1112.44 ml   Filed Weights   03/13/24 1129  Weight: 49.9 kg    Examination:   General: Appearance:    Well developed, well nourished female in no acute distress     Lungs:     Clear to auscultation bilaterally, respirations unlabored  Heart:    Normal heart rate.    MS:   All extremities are intact.    Neurologic:   Awake, alert, oriented x 3. No apparent focal neurological           defect.        Data Reviewed: I have personally reviewed following labs and imaging studies  CBC: Recent Labs  Lab 03/13/24 1253 03/13/24 2341  WBC 11.6* 10.1  HGB 9.5* 7.4*  HCT 27.8* 22.1*  MCV 91.7 92.5  PLT 238 183   Basic Metabolic Panel: Recent Labs  Lab 03/13/24 1253 03/13/24 2341  NA 124* 126*  K 4.1 3.9  CL 89*  95*  CO2 24 24  GLUCOSE 127* 113*  BUN 22 23  CREATININE 1.26* 1.09*  CALCIUM  8.6* 8.0*   GFR: Estimated Creatinine Clearance: 28.1 mL/min (A) (by C-G formula based on SCr of 1.09 mg/dL (H)). Liver Function Tests: Recent Labs  Lab 03/13/24 1253  AST 18  ALT 13  ALKPHOS 69  BILITOT 1.1  PROT 6.0*  ALBUMIN  3.5   No results for input(s): "LIPASE", "AMYLASE" in the last 168 hours. No results for input(s): "AMMONIA" in the last 168 hours. Coagulation Profile: No results for input(s): "INR", "PROTIME" in the last 168 hours. Cardiac Enzymes: No results for input(s): "CKTOTAL", "CKMB", "CKMBINDEX", "TROPONINI" in the last 168 hours. BNP (last 3 results) No results for input(s): "PROBNP" in the last 8760 hours. HbA1C: No results for input(s): "HGBA1C" in the last 72 hours. CBG: Recent Labs  Lab 03/13/24 1141   GLUCAP 161*   Lipid Profile: No results for input(s): "CHOL", "HDL", "LDLCALC", "TRIG", "CHOLHDL", "LDLDIRECT" in the last 72 hours. Thyroid  Function Tests: No results for input(s): "TSH", "T4TOTAL", "FREET4", "T3FREE", "THYROIDAB" in the last 72 hours. Anemia Panel: No results for input(s): "VITAMINB12", "FOLATE", "FERRITIN", "TIBC", "IRON", "RETICCTPCT" in the last 72 hours. Sepsis Labs: Recent Labs  Lab 03/13/24 2341  PROCALCITON <0.10    No results found for this or any previous visit (from the past 240 hours).       Radiology Studies: CT Chest Wo Contrast Result Date: 03/13/2024 CLINICAL DATA:  Pneumonia complication. EXAM: CT CHEST WITHOUT CONTRAST TECHNIQUE: Multidetector CT imaging of the chest was performed following the standard protocol without IV contrast. RADIATION DOSE REDUCTION: This exam was performed according to the departmental dose-optimization program which includes automated exposure control, adjustment of the mA and/or kV according to patient size and/or use of iterative reconstruction technique. COMPARISON:  Chest x-ray 03/06/2024. FINDINGS: Cardiovascular: No significant vascular findings. Normal heart size. No pericardial effusion. There are atherosclerotic calcifications of the aorta. Mediastinum/Nodes: No enlarged mediastinal or axillary lymph nodes. Thyroid  gland, trachea, and esophagus demonstrate no significant findings. Lungs/Pleura: There are minimal clustered tree-in-bud opacities in the left lower lobe. There is focal ill-defined nodular density with surrounding ground-glass opacity in the right upper lobe measuring 10 x 7 by 7 mm with some adjacent pleural thickening/tethering. There is some scarring in the lingula. There is no pleural effusion or pneumothorax. There is a calcified granuloma in the left upper lobe. There is a noncalcified 3 mm nodule in the right upper lobe image 4/77. There is a noncalcified nodule in the right lower lobe measuring 5 mm  image 4/104. Upper Abdomen: No acute abnormality. Cholecystectomy clips are present. There is a 2.3 cm cyst in the left kidney. Musculoskeletal: Degenerative changes affect the spine. IMPRESSION: 1. Minimal clustered tree-in-bud opacities in the left lower lobe compatible with infectious/inflammatory process. 2. Ill-defined nodular density with surrounding ground-glass opacity in the right upper lobe measuring up to 10 mm. This may be infectious/inflammatory, but neoplasm is not excluded. Consider one of the following in 3 months for both low-risk and high-risk individuals: (a) repeat chest CT, (b) follow-up PET-CT, or (c) tissue sampling. This recommendation follows the consensus statement: Guidelines for Management of Incidental Pulmonary Nodules Detected on CT Images: From the Fleischner Society 2017; Radiology 2017; 284:228-243. 3. Additional small right lung nodules measuring up to 5 mm. 4. Aortic atherosclerosis. Aortic Atherosclerosis (ICD10-I70.0). Electronically Signed   By: Tyron Gallon M.D.   On: 03/13/2024 21:28   CT PELVIS W CONTRAST Result Date:  03/13/2024 CLINICAL DATA:  Anemia low blood pressure right-sided hip pain EXAM: CT PELVIS WITH CONTRAST TECHNIQUE: Multidetector CT imaging of the pelvis was performed using the standard protocol following the bolus administration of intravenous contrast. RADIATION DOSE REDUCTION: This exam was performed according to the departmental dose-optimization program which includes automated exposure control, adjustment of the mA and/or kV according to patient size and/or use of iterative reconstruction technique. CONTRAST:  75mL OMNIPAQUE  IOHEXOL  300 MG/ML  SOLN COMPARISON:  CT 10/28/2023, 11/23/2021 FINDINGS: Urinary Tract:  Partially obscured by right hip hardware artifact Bowel:  No acute bowel wall thickening Vascular/Lymphatic: Aortic atherosclerosis. No aneurysm. No suspicious lymph nodes Reproductive: Hysterectomy. Curvilinear density at the level of  vaginal cuff is a chronic finding and presumably related to postsurgical change. Other:  Negative for pelvic effusion or free air. Musculoskeletal: Right hip replacement with artifact. No acute fracture is seen. Edema within the subcutaneous soft tissues of the right posterior hip and gluteal region. Asymmetrical enlargement of the right gluteus muscles compared to the left, with oval slightly dense masslike area measuring 7.5 x 5.2 cm, consistent with intramuscular hematoma. Small focus of globular contrast, series 2, image 67. Some edema and asymmetrical enlargement of the right abductor muscles as well. IMPRESSION: 1. Right hip replacement with artifact. No acute osseous abnormality. 2. 7.5 cm suspected intramuscular hematoma within the right gluteus muscles with small focus of globular contrast, possible small focus of active bleeding. Edema within the subcutaneous soft tissues of the right posterolateral hip and gluteal region. 3. Aortic atherosclerosis. Aortic Atherosclerosis (ICD10-I70.0). Electronically Signed   By: Esmeralda Hedge M.D.   On: 03/13/2024 21:14   DG Hip Unilat  With Pelvis 2-3 Views Right Result Date: 03/13/2024 CLINICAL DATA:  Status post fall with right hip pain EXAM: DG HIP (WITH OR WITHOUT PELVIS) 3V RIGHT COMPARISON:  Radiograph of the right hips dated 12/09/2023 FINDINGS: Postsurgical changes of right hip arthroplasty. Hardware appears intact and well seated. There is no evidence of hip fracture or dislocation. IMPRESSION: Postsurgical changes of right hip arthroplasty without evidence of acute fracture or dislocation. Electronically Signed   By: Limin  Xu M.D.   On: 03/13/2024 13:44        Scheduled Meds:  feeding supplement  237 mL Oral BID BM   pantoprazole   40 mg Oral Daily   sodium chloride  flush  3 mL Intravenous Q12H   venlafaxine  XR  37.5 mg Oral Q breakfast   Continuous Infusions:   LOS: 1 day    Time spent: 45 minutes spent on chart review, discussion with  nursing staff, consultants, updating family and interview/physical exam; more than 50% of that time was spent in counseling and/or coordination of care.    Enrigue Harvard, DO [facility] Available via Epic secure chat 7am-7pm After these hours, please refer to coverage provider listed on [email or web address] 03/14/2024, 11:52 AM

## 2024-03-14 NOTE — TOC Initial Note (Signed)
 Transition of Care [facility]) - Initial/Assessment Note    Patient Details  Name: Kristen [name] MRN: [id number] Date of Birth: Mar 01, 1933  [facility] CM/SW Contact:    Ruben Corolla, RN Phone Number: 03/14/2024, 3:52 PM  Clinical Narrative: Patient from [facility] Living rep Abby confirmed. D/c plan return. Wears eyeglasses, IADL's, urses cane, rw when needed. Await PT recc. May need safe transport for return.                  Expected Discharge Plan: Home/Self Care Barriers to Discharge: Continued Medical Work up   Patient Goals and CMS Choice Patient states their goals for this hospitalization and ongoing recovery are:: Indep [employer] Medicare.gov Compare Post Acute Care list provided to:: Patient Choice offered to / list presented to : Patient Rand ownership interest in [facility].provided to:: Patient    Expected Discharge Plan and Services   Discharge Planning Services: CM Consult Post Acute Care Choice: Resumption of Svcs/PTA Provider Living arrangements for the past 2 months: Independent Living Facility                                      Prior Living Arrangements/Services Living arrangements for the past 2 months: Independent Living Facility Lives with:: Self   Do you feel safe going back to the place where you live?: Yes          Current home services: DME (cane, rw)    Activities of Daily Living   ADL Screening (condition at time of admission) Independently performs ADLs?: Yes (appropriate for developmental age) Is the patient deaf or have difficulty hearing?: No Does the patient have difficulty seeing, even when wearing glasses/contacts?: No Does the patient have difficulty concentrating, remembering, or making decisions?: No  Permission Sought/Granted Permission sought to share information with : Case Manager Permission granted to share information with : Yes, Verbal Permission Granted               Emotional Assessment              Admission diagnosis:  Hyponatremia [E87.1] Intramuscular hematoma [T14.8XXA] Anemia, unspecified type [D64.9] Patient Active Problem List   Diagnosis Date Noted   Hyponatremia 03/14/2024   Intramuscular hematoma 03/13/2024   Anxiety 03/13/2024   Adenocarcinoma of lung (HCC) 12/29/2023   Hypomagnesemia 12/29/2023   Trigeminal neuralgia 12/29/2023   E-coli UTI 12/29/2023   Intractable nausea 12/26/2023   SIADH (syndrome of inappropriate ADH production) (HCC) 12/24/2023   S/P total right hip arthroplasty 10/30/2023   Closed right hip fracture, initial encounter (HCC) 10/29/2023   History of pulmonary embolism 10/29/2023   Renal cyst, right 10/29/2023   Leukocytosis 10/29/2023   DOE (dyspnea on exertion) 05/20/2022   Chronic rhinitis 05/20/2022   Dysphagia 05/20/2022   Pulmonary embolism (HCC) 11/26/2021   Acute pulmonary embolism without acute cor pulmonale (HCC) 11/23/2021   Pulmonary mass 11/23/2021   Chronic pain disorder 11/23/2021   CKD stage 3a, GFR 45-59 ml/min (HCC) 11/23/2021   DNR (do not resuscitate) 11/23/2021   Tricuspid regurgitation 10/07/2021   Pulmonary hypertension, unspecified (HCC) 10/07/2021   Hyperlipidemia 10/07/2021   Essential hypertension 10/07/2021   Atypical chest pain 10/07/2021   PCP:  Benedetta Bradley, MD Pharmacy:   [place] [facility] [place], [place] - [address] [place] [address] Phone: [phone] Fax: [phone]  [facility] [address] [place] [address] Phone: [phone] Fax: [phone]     Social Drivers of Health (SDOH) Social History: SDOH Screenings   Food Insecurity: No Food Insecurity (03/14/2024)  Housing: Low Risk  (03/14/2024)  Transportation Needs: No Transportation Needs (03/14/2024)  Utilities: Not At Risk (03/14/2024)  Financial Resource Strain: Low Risk   (06/19/2019)   Received from [facility]  Social Connections: Socially Isolated (03/14/2024)  Stress: No Stress Concern Present (06/19/2019)   Received from [facility]  Tobacco Use: Low Risk  (03/13/2024)  Health Literacy: Medium Risk (02/01/2021)   Received from [facility]   SDOH Interventions:     Readmission Risk Interventions    12/28/2023    3:43 PM  Readmission Risk Prevention Plan  Transportation Screening Complete  PCP or Specialist Appt within 3-5 Days Complete  HRI or Home Care Consult Complete  Palliative Care Screening Not Applicable  Medication Review (RN Care Manager) Complete

## 2024-03-14 NOTE — Progress Notes (Signed)
 PT Cancellation Note  Patient Details Name: Kristen [name] MRN: [id number] DOB: 1933-08-13   Cancelled Treatment:    Reason Eval/Treat Not Completed:  Attempted PT eval-pt declined participation at this time. Will check back as schedule allows .   Tanda Falter, PT Acute Rehabilitation  Office: [phone]

## 2024-03-15 DIAGNOSIS — T148XXA Other injury of unspecified body region, initial encounter: Secondary | ICD-10-CM | POA: Diagnosis not present

## 2024-03-15 LAB — BPAM RBC
Blood Product Expiration Date: 202506032359
ISSUE DATE / TIME: 202505201448
Unit Type and Rh: 600

## 2024-03-15 LAB — TYPE AND SCREEN
ABO/RH(D): A NEG
Antibody Screen: NEGATIVE
Unit division: 0

## 2024-03-15 LAB — BASIC METABOLIC PANEL WITH GFR
Anion gap: 7 (ref 5–15)
Anion gap: 8 (ref 5–15)
Anion gap: 5 (ref 5–15)
BUN: 26 mg/dL — ABNORMAL HIGH (ref 8–23)
BUN: 21 mg/dL (ref 8–23)
BUN: 21 mg/dL (ref 8–23)
CO2: 24 mmol/L (ref 22–32)
CO2: 25 mmol/L (ref 22–32)
CO2: 26 mmol/L (ref 22–32)
Calcium: 7.9 mg/dL — ABNORMAL LOW (ref 8.9–10.3)
Calcium: 8.5 mg/dL — ABNORMAL LOW (ref 8.9–10.3)
Calcium: 7.9 mg/dL — ABNORMAL LOW (ref 8.9–10.3)
Chloride: 96 mmol/L — ABNORMAL LOW (ref 98–111)
Chloride: 98 mmol/L (ref 98–111)
Chloride: 99 mmol/L (ref 98–111)
Creatinine, Ser: 0.97 mg/dL (ref 0.44–1.00)
Creatinine, Ser: 0.97 mg/dL (ref 0.44–1.00)
Creatinine, Ser: 0.95 mg/dL (ref 0.44–1.00)
GFR, Estimated: 56 mL/min — ABNORMAL LOW (ref >60)
GFR, Estimated: 56 mL/min — ABNORMAL LOW (ref >60)
GFR, Estimated: 58 mL/min — ABNORMAL LOW (ref >60)
Glucose, Bld: 99 mg/dL (ref 70–99)
Glucose, Bld: 129 mg/dL — ABNORMAL HIGH (ref 70–99)
Glucose, Bld: 128 mg/dL — ABNORMAL HIGH (ref 70–99)
Potassium: 4 mmol/L (ref 3.5–5.1)
Potassium: 4.5 mmol/L (ref 3.5–5.1)
Potassium: 4.1 mmol/L (ref 3.5–5.1)
Sodium: 127 mmol/L — ABNORMAL LOW (ref 135–145)
Sodium: 131 mmol/L — ABNORMAL LOW (ref 135–145)
Sodium: 130 mmol/L — ABNORMAL LOW (ref 135–145)

## 2024-03-15 LAB — CBC
HCT: 24.8 % — ABNORMAL LOW (ref 36.0–46.0)
HCT: 26.1 % — ABNORMAL LOW (ref 36.0–46.0)
HCT: 34.1 % — ABNORMAL LOW (ref 36.0–46.0)
Hemoglobin: 11.2 g/dL — ABNORMAL LOW (ref 12.0–15.0)
Hemoglobin: 8.2 g/dL — ABNORMAL LOW (ref 12.0–15.0)
Hemoglobin: 8.8 g/dL — ABNORMAL LOW (ref 12.0–15.0)
MCH: 30.8 pg (ref 26.0–34.0)
MCH: 30.8 pg (ref 26.0–34.0)
MCH: 31.5 pg (ref 26.0–34.0)
MCHC: 32.8 g/dL (ref 30.0–36.0)
MCHC: 33.1 g/dL (ref 30.0–36.0)
MCHC: 33.7 g/dL (ref 30.0–36.0)
MCV: 91.3 fL (ref 80.0–100.0)
MCV: 93.2 fL (ref 80.0–100.0)
MCV: 96.1 fL (ref 80.0–100.0)
Platelets: 180 10*3/uL (ref 150–400)
Platelets: 183 10*3/uL (ref 150–400)
Platelets: 222 10*3/uL (ref 150–400)
RBC: 2.66 MIL/uL — ABNORMAL LOW (ref 3.87–5.11)
RBC: 2.86 MIL/uL — ABNORMAL LOW (ref 3.87–5.11)
RBC: 3.55 MIL/uL — ABNORMAL LOW (ref 3.87–5.11)
RDW: 13.4 % (ref 11.5–15.5)
RDW: 13.5 % (ref 11.5–15.5)
RDW: 13.6 % (ref 11.5–15.5)
WBC: 10.7 10*3/uL — ABNORMAL HIGH (ref 4.0–10.5)
WBC: 8.6 10*3/uL (ref 4.0–10.5)
WBC: 9.1 10*3/uL (ref 4.0–10.5)
nRBC: 0 % (ref 0.0–0.2)
nRBC: 0 % (ref 0.0–0.2)
nRBC: 0 % (ref 0.0–0.2)

## 2024-03-15 MED ORDER — DICLOFENAC SODIUM 1 % EX GEL
4.0000 g | Freq: Four times a day (QID) | CUTANEOUS | Status: DC | PRN
  Administered 2024-03-15: 4 g via TOPICAL
  Filled 2024-03-15 (×2): qty 100

## 2024-03-15 MED ORDER — HYDRALAZINE HCL 20 MG/ML IJ SOLN: 10.0000 mg | INTRAMUSCULAR | Status: DC | PRN

## 2024-03-15 MED ORDER — GUAIFENESIN 100 MG/5ML PO LIQD: 5.0000 mL | ORAL | Status: DC | PRN

## 2024-03-15 MED ORDER — POLYETHYLENE GLYCOL 3350 17 G PO PACK
17.0000 g | PACK | Freq: Every day | ORAL | Status: DC
  Administered 2024-03-15 – 2024-03-16 (×2): 17 g via ORAL
  Filled 2024-03-15 (×3): qty 1

## 2024-03-15 MED ORDER — DOCUSATE SODIUM 100 MG PO CAPS
100.0000 mg | ORAL_CAPSULE | Freq: Two times a day (BID) | ORAL | Status: DC
  Administered 2024-03-15 – 2024-03-16 (×4): 100 mg via ORAL
  Filled 2024-03-15 (×5): qty 1

## 2024-03-15 MED ORDER — IPRATROPIUM-ALBUTEROL 0.5-2.5 (3) MG/3ML IN SOLN: 3.0000 mL | RESPIRATORY_TRACT | Status: DC | PRN

## 2024-03-15 MED ORDER — HYDROXYZINE HCL 25 MG PO TABS
25.0000 mg | ORAL_TABLET | Freq: Every day | ORAL | Status: DC
  Administered 2024-03-15 – 2024-03-16 (×2): 25 mg via ORAL
  Filled 2024-03-15 (×2): qty 1

## 2024-03-15 MED ORDER — LOSARTAN POTASSIUM 50 MG PO TABS
100.0000 mg | ORAL_TABLET | Freq: Every day | ORAL | Status: DC
  Administered 2024-03-15 – 2024-03-17 (×3): 100 mg via ORAL
  Filled 2024-03-15 (×4): qty 2

## 2024-03-15 MED ORDER — METOPROLOL TARTRATE 5 MG/5ML IV SOLN: 5.0000 mg | INTRAVENOUS | Status: DC | PRN

## 2024-03-15 MED ORDER — SODIUM CHLORIDE 0.9 % IV SOLN: INTRAVENOUS | Status: AC

## 2024-03-15 MED ORDER — HYDROMORPHONE HCL 2 MG PO TABS
2.0000 mg | ORAL_TABLET | Freq: Once | ORAL | Status: AC
  Administered 2024-03-15: 2 mg via ORAL
  Filled 2024-03-15: qty 1

## 2024-03-15 NOTE — Consult Note (Signed)
 ORTHOPAEDIC CONSULTATION  REQUESTING PHYSICIAN: [name], MD  PCP:  Benedetta Bradley, MD  Chief Complaint: Fall - right hip pain   HPI: Kristen [name] is a 89 y.o. female with medical history significant for COPD, chronic pain, hypertension, hyperlipidemia, depression, anxiety, chronic hyponatremia, lung nodules concerning for adenocarcinoma, and history of PE on Eliquis  who presents with right hip pain after a fall 2 days ago.   Patient fell onto her right hip 2 days ago and has had increasing pain and swelling since then.  She had been experiencing lightheadedness and near syncope earlier, but denies that now.  Orthopaedics was consulted for evaluation and management of her right hip / hematoma  _____________  Patient was seen in her room this morning by myself and Dr. Bernard Brick. She is alert, but reports feeling very tired and as though she is 'not going to make it'   She does recall Dr. Bernard Brick doing her hip previously and reports it has done well.   Past Medical History:  Diagnosis Date   Anemia    Arthritis    B12 deficiency    Chronic kidney disease    Chronic pain disorder    trigeminal neuralgia   Depression    Dyslipidemia    Hypertension    PONV (postoperative nausea and vomiting)    Seizure disorder (HCC)    decades since last seizure   Tremor    Trigeminal nerve disorder    Past Surgical History:  Procedure Laterality Date   ABDOMINAL HYSTERECTOMY     BLADDER REPAIR     tack   CATARACT EXTRACTION     CHOLECYSTECTOMY     ESOPHAGOGASTRODUODENOSCOPY N/A 12/30/2023   Procedure: EGD (ESOPHAGOGASTRODUODENOSCOPY);  Surgeon: Renaye Carp, DO;  Location: [facility];  Service: Gastroenterology;  Laterality: N/A;   gamma knife  2004   NOSE SURGERY     for fracture   SHOULDER ARTHROSCOPY DISTAL CLAVICLE EXCISION AND OPEN ROTATOR CUFF REPAIR Left    rotary cuff repair   TONSILLECTOMY     TOTAL HIP ARTHROPLASTY Right 10/30/2023   Procedure: TOTAL HIP  ARTHROPLASTY ANTERIOR APPROACH;  Surgeon: Claiborne Crew, MD;  Location: [facility] OR;  Service: Orthopedics;  Laterality: Right;   TOTAL KNEE ARTHROPLASTY Bilateral    UMBILICAL HERNIA REPAIR     Social History   Socioeconomic History   Marital status: Widowed    Spouse name: Not on file   Number of children: 3   Years of education: Not on file   Highest education level: Not on file  Occupational History   Occupation: retired  Tobacco Use   Smoking status: Never    Passive exposure: Never   Smokeless tobacco: Never  Substance and Sexual Activity   Alcohol use: Not Currently   Drug use: Never   Sexual activity: Not on file  Other Topics Concern   Not on file  Social History Narrative   12/15/21 lives at [place]   Social Drivers of Health   Financial Resource Strain: Low Risk  (06/19/2019)   Received from [facility], [facility]   Overall Financial Resource Strain (CARDIA)    Difficulty of Paying Living Expenses: Not hard at all  Food Insecurity: No Food Insecurity (03/14/2024)   Hunger Vital Sign    Worried About Running Out of Food in the Last Year: Never true    Ran Out of Food in the Last Year: Never true  Transportation Needs: No Transportation Needs (03/14/2024)  PRAPARE - Administrator, Civil Service (Medical): No    Lack of Transportation (Non-Medical): No  Physical Activity: Not on file  Stress: No Stress Concern Present (06/19/2019)   Received from [facility], [facility]   [facility] of Occupational Health - Occupational Stress Questionnaire    Feeling of Stress : Only a little  Social Connections: Socially Isolated (03/14/2024)   Social Connection and Isolation Panel [NHANES]    Frequency of Communication with Friends and Family: More than three times a week    Frequency of Social Gatherings with Friends and Family: More than three times a week    Attends Religious Services: Never    Database administrator or Organizations: No     Attends Banker Meetings: Never    Marital Status: Widowed   History reviewed. No pertinent family history. Allergies  Allergen Reactions   Penicillins Hives and Rash   Tetracyclines & Related Anaphylaxis   Sulfa Antibiotics Hives and Other (See Comments)    unknown   Prior to Admission medications   Medication Sig Start Date End Date Taking? Authorizing Provider  cyanocobalamin  (,VITAMIN B-12,) 1000 MCG/ML injection Inject 100 mcg into the muscle every 30 (thirty) days. 11/02/19  Yes [provider]  desvenlafaxine (PRISTIQ) 25 MG 24 hr tablet Take 25 mg by mouth every morning. 02/29/24  Yes [provider]  diclofenac  Sodium (VOLTAREN ) 1 % GEL Apply 4 g topically 4 (four) times daily as needed (For pain).   Yes [provider]  ELIQUIS  5 MG TABS tablet Take 5 mg by mouth 2 (two) times daily. 09/28/22  Yes [provider]  esomeprazole (NEXIUM) 40 MG capsule Take 40 mg by mouth daily.   Yes [provider]  FEROSUL 325 (65 Fe) MG tablet Take 325 mg by mouth every other day. 02/29/24  Yes [provider]  HYDROmorphone  (DILAUDID ) 2 MG tablet Take 1 tablet (2 mg total) by mouth in the morning and at bedtime. Patient taking differently: Take 2-4 mg by mouth in the morning and at bedtime. Take one tablet by mouth in the morning. Take two tablets by mouth in the evening 11/04/23  Yes Doroteo Gasmen, MD  hydrOXYzine  (VISTARIL ) 25 MG capsule Take 25 mg by mouth at bedtime.   Yes [provider]  lidocaine  (XYLOCAINE ) 2 % solution Use as directed 15 mLs in the mouth or throat 2 (two) times daily as needed for mouth pain. 03/10/24  Yes [provider]  losartan  (COZAAR ) 100 MG tablet Take 100 mg by mouth daily. 02/29/24  Yes [provider]  metoCLOPramide  (REGLAN ) 5 MG tablet Take 1 tablet (5 mg total) by mouth 3 (three) times daily before meals. Patient taking differently: Take 5 mg by mouth every 8 (eight)  hours as needed for vomiting, refractory nausea / vomiting or nausea. 12/30/23  Yes Shalhoub, Merrill Abide, MD  ondansetron  (ZOFRAN ) 8 MG tablet Take 8 mg by mouth every 8 (eight) hours as needed for nausea or vomiting.   Yes [provider]  prochlorperazine (COMPAZINE) 5 MG tablet Take 5 mg by mouth every 6 (six) hours as needed for vomiting or nausea. 12/22/23  Yes [provider]  sucralfate  (CARAFATE ) 1 g tablet Take 1 g by mouth 2 (two) times daily. 03/04/24  Yes [provider]  Syringe/Needle, Disp, (SYRINGE 3CC/25GX1-1/2") 25G X 1-1/2" 3 ML MISC 1 each by Does not apply route every 30 (thirty) days. For B12  injection   Yes [provider]  docusate sodium  (COLACE) 50 MG capsule Take 50 mg by mouth daily as needed for mild constipation. Patient not taking: Reported on 02/21/2024    [provider]  levofloxacin  (LEVAQUIN ) 500 MG tablet Take 1 tablet (500 mg total) by mouth daily. Patient not taking: Reported on 03/13/2024 03/06/24   Cheyenne Cotta, MD   CT Chest Wo Contrast Result Date: 03/13/2024 CLINICAL DATA:  Pneumonia complication. EXAM: CT CHEST WITHOUT CONTRAST TECHNIQUE: Multidetector CT imaging of the chest was performed following the standard protocol without IV contrast. RADIATION DOSE REDUCTION: This exam was performed according to the departmental dose-optimization program which includes automated exposure control, adjustment of the mA and/or kV according to patient size and/or use of iterative reconstruction technique. COMPARISON:  Chest x-ray 03/06/2024. FINDINGS: Cardiovascular: No significant vascular findings. Normal heart size. No pericardial effusion. There are atherosclerotic calcifications of the aorta. Mediastinum/Nodes: No enlarged mediastinal or axillary lymph nodes. Thyroid  gland, trachea, and esophagus demonstrate no significant findings. Lungs/Pleura: There are minimal clustered tree-in-bud opacities in the left lower lobe. There is  focal ill-defined nodular density with surrounding ground-glass opacity in the right upper lobe measuring 10 x 7 by 7 mm with some adjacent pleural thickening/tethering. There is some scarring in the lingula. There is no pleural effusion or pneumothorax. There is a calcified granuloma in the left upper lobe. There is a noncalcified 3 mm nodule in the right upper lobe image 4/77. There is a noncalcified nodule in the right lower lobe measuring 5 mm image 4/104. Upper Abdomen: No acute abnormality. Cholecystectomy clips are present. There is a 2.3 cm cyst in the left kidney. Musculoskeletal: Degenerative changes affect the spine. IMPRESSION: 1. Minimal clustered tree-in-bud opacities in the left lower lobe compatible with infectious/inflammatory process. 2. Ill-defined nodular density with surrounding ground-glass opacity in the right upper lobe measuring up to 10 mm. This may be infectious/inflammatory, but neoplasm is not excluded. Consider one of the following in 3 months for both low-risk and high-risk individuals: (a) repeat chest CT, (b) follow-up PET-CT, or (c) tissue sampling. This recommendation follows the consensus statement: Guidelines for Management of Incidental Pulmonary Nodules Detected on CT Images: From the Fleischner Society 2017; Radiology 2017; 284:228-243. 3. Additional small right lung nodules measuring up to 5 mm. 4. Aortic atherosclerosis. Aortic Atherosclerosis (ICD10-I70.0). Electronically Signed   By: Tyron Gallon M.D.   On: 03/13/2024 21:28   CT PELVIS W CONTRAST Result Date: 03/13/2024 CLINICAL DATA:  Anemia low blood pressure right-sided hip pain EXAM: CT PELVIS WITH CONTRAST TECHNIQUE: Multidetector CT imaging of the pelvis was performed using the standard protocol following the bolus administration of intravenous contrast. RADIATION DOSE REDUCTION: This exam was performed according to the departmental dose-optimization program which includes automated exposure control, adjustment  of the mA and/or kV according to patient size and/or use of iterative reconstruction technique. CONTRAST:  75mL OMNIPAQUE  IOHEXOL  300 MG/ML  SOLN COMPARISON:  CT 10/28/2023, 11/23/2021 FINDINGS: Urinary Tract:  Partially obscured by right hip hardware artifact Bowel:  No acute bowel wall thickening Vascular/Lymphatic: Aortic atherosclerosis. No aneurysm. No suspicious lymph nodes Reproductive: Hysterectomy. Curvilinear density at the level of vaginal cuff is a chronic finding and presumably related to postsurgical change. Other:  Negative for pelvic effusion or free air. Musculoskeletal: Right hip replacement with artifact. No acute fracture is seen. Edema within the subcutaneous soft tissues of the right posterior hip and gluteal region. Asymmetrical enlargement of the right gluteus muscles compared to the left,  with oval slightly dense masslike area measuring 7.5 x 5.2 cm, consistent with intramuscular hematoma. Small focus of globular contrast, series 2, image 67. Some edema and asymmetrical enlargement of the right abductor muscles as well. IMPRESSION: 1. Right hip replacement with artifact. No acute osseous abnormality. 2. 7.5 cm suspected intramuscular hematoma within the right gluteus muscles with small focus of globular contrast, possible small focus of active bleeding. Edema within the subcutaneous soft tissues of the right posterolateral hip and gluteal region. 3. Aortic atherosclerosis. Aortic Atherosclerosis (ICD10-I70.0). Electronically Signed   By: Esmeralda Hedge M.D.   On: 03/13/2024 21:14   DG Hip Unilat  With Pelvis 2-3 Views Right Result Date: 03/13/2024 CLINICAL DATA:  Status post fall with right hip pain EXAM: DG HIP (WITH OR WITHOUT PELVIS) 3V RIGHT COMPARISON:  Radiograph of the right hips dated 12/09/2023 FINDINGS: Postsurgical changes of right hip arthroplasty. Hardware appears intact and well seated. There is no evidence of hip fracture or dislocation. IMPRESSION: Postsurgical changes of  right hip arthroplasty without evidence of acute fracture or dislocation. Electronically Signed   By: Limin  Xu M.D.   On: 03/13/2024 13:44    Positive ROS: All other systems have been reviewed and were otherwise negative with the exception of those mentioned in the HPI and as above.  Physical Exam: General: Alert, no acute distress   MUSCULOSKELETAL:  Right Hip: No ecchymosis about her buttock or thigh that we can see Tender to palpation about the posterior gluteal region She reports pain with log rolling of the hip, but states it 'hurts all over' - she overall tolerates this well without wincing   Assessment: Fall resulting in right hip/gluteal contusion with accompanying anemia, now s/p 1 unit PRBC  History of right total hip arthroplasty 10/30/23 by Dr. Bernard Brick for femoral neck fracture  Plan:  Hgb 7.4 > 8.8 today after 1 unit  Dr. Bernard Brick reviewed imaging of her hip and there are no indications for surgical intervention WBAT - up with PT as able  Continue to hold Eliquis  until Hgb stabilized  Continue use of heat as tolerated by patient  May need SNF vs her independent living facility at d/c depending on functional abilities with PT while here  Follow up in our office in 4 weeks for recheck   We will be available as needed if further questions arise  Earnie Gola, PA-C Cell [phone]   03/15/2024 8:12 AM

## 2024-03-15 NOTE — Hospital Course (Addendum)
 Brief Narrative:  89 y.o. female with medical history significant for COPD, chronic pain, hypertension, hyperlipidemia, depression, anxiety, chronic hyponatremia, lung nodules concerning for adenocarcinoma, and history of PE on Eliquis  who presents with right hip pain after a fall 2 days ago.  Found to have intramuscular hematoma and hyponatremia.  Seen by orthopedic.  Currently Eliquis  is on hold.   Patient fell onto her right hip 2 days ago and has had increasing pain and swelling since then.  She had been experiencing lightheadedness and near syncope earlier, but denies that now  Today she is better. Will dc her to SNF   Assessment & Plan:  Principal Problem:   Intramuscular hematoma Active Problems:   Essential hypertension   History of pulmonary embolism   Chronic pain disorder   CKD stage 3a, GFR 45-59 ml/min (HCC)   SIADH (syndrome of inappropriate ADH production) (HCC)   Adenocarcinoma of lung (HCC)   Anxiety   Hyponatremia   Intramuscular hematoma; acute anemia  History of right hip total arthroplasty January 2025 -Unfortunately this is in the setting of her fall and on Eliquis  due to her chronic pulmonary embolism.  Will continue to hold Eliquis  for now. -Weightbearing as tolerated    Hyponatremia  -Admission sodium 124, appears to be from dehydration, gentle hydration and trend sodium levels.  Acute blood loss anemia - Baseline hemoglobin 12.0, admission hemoglobin 9.5 trended down to 7.4.  Status post 1 unit PRBC transfusion 5/20.   Hx of PE ; Feb 2023 -Previously determined she should be on Eliquis  lifelong but given this fall and hematoma, will need to hold it until hemoglobin stabilizes.   Lung nodules  - Adenocarcinoma is suspected, followed by pulmonology in outpatient setting   -see CT scan below   History of hypertension  -Now elevated therefore will resume home losartan    CKD 3A  -From baseline hemoglobin 1.0    Depression, anxiety  - Pristiq,  hydroxyzine      PT/OT-SNF, consult TOC   DVT prophylaxis: SCDs Start: 03/13/24 2333      Code Status: Limited: Do not attempt resuscitation (DNR) -DNR-LIMITED -Do Not Intubate/DNI  Family Communication: Daughter at bedside Status is: Inpatient Pending SNF placement   Subjective: Feels little dizzy but very poor PO intake No other complaints.   Examination:  General exam: Appears calm and comfortable  Respiratory system: Clear to auscultation. Respiratory effort normal. Cardiovascular system: S1 & S2 heard, RRR. No JVD, murmurs, rubs, gallops or clicks. No pedal edema. Gastrointestinal system: Abdomen is nondistended, soft and nontender. No organomegaly or masses felt. Normal bowel sounds heard. Central nervous system: Alert and oriented. No focal neurological deficits. Extremities: Symmetric 5 x 5 power. Skin: No rashes, lesions or ulcers Psychiatry: Judgement and insight appear normal. Mood & affect appropriate.

## 2024-03-15 NOTE — Progress Notes (Signed)
 PROGRESS NOTE    Kristen [name]  WUJ:[id number] DOB: Apr 11, 1933 DOA: 03/13/2024 PCP: Benedetta Bradley, MD    Brief Narrative:  89 y.o. female with medical history significant for COPD, chronic pain, hypertension, hyperlipidemia, depression, anxiety, chronic hyponatremia, lung nodules concerning for adenocarcinoma, and history of PE on Eliquis  who presents with right hip pain after a fall 2 days ago.  Found to have intramuscular hematoma and hyponatremia.  Seen by orthopedic.  Currently Eliquis  is on hold.   Patient fell onto her right hip 2 days ago and has had increasing pain and swelling since then.  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  Will continue to hold Eliquis  for now. -Weightbearing as tolerated    Hyponatremia  -Admission sodium 124, appears to be from dehydration, gentle hydration and trend sodium levels.  Acute blood loss anemia - Baseline hemoglobin 12.0, admission hemoglobin 9.5 trended down to 7.4.  Status post 1 unit PRBC transfusion 5/20.   Hx of PE ; Feb 2023 -Previously determined she should be on Eliquis  lifelong but given this fall and hematoma, will need to hold it until hemoglobin stabilizes.   Lung nodules  - Adenocarcinoma is suspected, followed by pulmonology in outpatient setting   -see CT scan below   History of hypertension  -Now elevated therefore will resume home losartan    CKD 3A   -From baseline hemoglobin 1.0    Depression, anxiety  - Pristiq, hydroxyzine      PT/OT-SNF, consult TOC   DVT prophylaxis: SCDs Start: 03/13/24 2333      Code Status: Limited: Do not attempt resuscitation (DNR) -DNR-LIMITED -Do Not Intubate/DNI  Family Communication: Regular caregivers at bedside Status is: Inpatient Management for HypoNa    Subjective: Seen at bedside, sitting up in recliner.   Examination:  General exam: Appears calm and comfortable  Respiratory system: Clear to auscultation. Respiratory effort normal. Cardiovascular system: S1 & S2 heard, RRR. No JVD, murmurs, rubs, gallops or clicks. No pedal edema. Gastrointestinal system: Abdomen is nondistended, soft and nontender. No organomegaly or masses felt. Normal bowel sounds heard. Central nervous system: Alert and oriented. No focal neurological deficits. Extremities: Symmetric 5 x 5 power. Skin: No rashes, lesions or ulcers Psychiatry: Judgement and insight appear normal. Mood & affect appropriate.                Diet Orders (From admission, onward)     Start     Ordered   03/13/24 2333  Diet regular Room service appropriate? Yes; Fluid consistency: Thin; Fluid restriction: 1200 mL Fluid  Diet effective now       Question Answer Comment  Room service appropriate? Yes   Fluid consistency: Thin   Fluid restriction: 1200 mL Fluid      03/13/24 2334            Objective: Vitals:   03/14/24 1513 03/14/24 1757 03/14/24 1958 03/15/24 0346  BP: (!) 112/59 Aaron Aas)  138/51 (!) 127/55 (!) 171/68  Pulse: 69 70 90 65  Resp: 20 20 18    Temp: 98.6 F (37 C) 98.4 F (36.9 C) 98.5 F (36.9 C) 97.7 F (36.5 C)  TempSrc:  Oral Oral Oral  SpO2:  100% 97% 99%  Weight:      Height:        Intake/Output Summary (Last 24 hours) at 03/15/2024 1202 Last data filed at 03/15/2024 1025 Gross per 24 hour  Intake 749 ml  Output 250 ml  Net 499 ml   Filed Weights   03/13/24 1129  Weight: 49.9 kg     Scheduled Meds:  docusate sodium   100 mg Oral BID   feeding supplement  237 mL Oral BID BM   hydrOXYzine   25 mg Oral QHS   losartan   100 mg Oral Daily   pantoprazole   40 mg Oral Daily   polyethylene glycol  17 g Oral Daily   sodium chloride  flush  3 mL Intravenous Q12H   venlafaxine  XR  37.5 mg Oral Q breakfast   Continuous Infusions:  sodium chloride  75 mL/hr at 03/15/24 5284    Nutritional status     Body mass index is 20.12 kg/m.  Data Reviewed:   CBC: Recent Labs  Lab 03/13/24 1253 03/13/24 2341 03/14/24 1148 03/15/24 0047  WBC 11.6* 10.1 8.0 9.1  HGB 9.5* 7.4* 7.4* 8.8*  HCT 27.8* 22.1* 22.0* 26.1*  MCV 91.7 92.5 94.4 91.3  PLT 238 183 191 180   Basic Metabolic Panel: Recent Labs  Lab 03/13/24 1253 03/13/24 2341 03/14/24 1148 03/15/24 0047 03/15/24 0955  NA 124* 126* 129* 127* 131*  K 4.1 3.9 3.8 4.0 4.5  CL 89* 95* 97* 96* 98  CO2 24 24 22 24 25   GLUCOSE 127* 113* 115* 99 129*  BUN 22 23 20  26* 21  CREATININE 1.26* 1.09* 1.13* 0.97 0.97  CALCIUM  8.6* 8.0* 8.3* 7.9* 8.5*   GFR: Estimated Creatinine Clearance: 31.6 mL/min (by C-G formula based on SCr of 0.97 mg/dL). Liver Function Tests: Recent Labs  Lab 03/13/24 1253  AST 18  ALT 13  ALKPHOS 69  BILITOT 1.1  PROT 6.0*  ALBUMIN  3.5   No results for input(s): "LIPASE", "AMYLASE" in the last 168 hours. No results for input(s): "AMMONIA" in the last 168 hours. Coagulation Profile: No results for input(s): "INR", "PROTIME" in the last 168 hours. Cardiac Enzymes: No results for input(s): "CKTOTAL", "CKMB", "CKMBINDEX", "TROPONINI" in the last 168 hours. BNP (last 3 results) No results for input(s): "PROBNP" in the last 8760 hours. HbA1C: No results for input(s): "HGBA1C" in the last 72 hours. CBG: Recent Labs  Lab 03/13/24 1141  GLUCAP 161*   Lipid Profile: No results for input(s): "CHOL", "HDL", "LDLCALC", "TRIG", "CHOLHDL", "LDLDIRECT" in the last 72 hours. Thyroid  Function  Tests: No results for input(s): "TSH", "T4TOTAL", "FREET4", "T3FREE", "THYROIDAB" in the last 72 hours. Anemia Panel: No results for input(s): "VITAMINB12", "FOLATE", "FERRITIN", "TIBC", "IRON", "RETICCTPCT" in the last 72 hours. Sepsis Labs: Recent Labs  Lab 03/13/24 2341  PROCALCITON <0.10    No results found for this or any previous visit (from the past 240 hours).       Radiology Studies: CT Chest Wo Contrast Result Date: 03/13/2024 CLINICAL DATA:  Pneumonia complication. EXAM: CT CHEST WITHOUT CONTRAST TECHNIQUE: Multidetector CT imaging of the chest was performed following the standard protocol without IV contrast. RADIATION DOSE REDUCTION: This exam was performed according to the departmental dose-optimization program which includes automated  exposure control, adjustment of the mA and/or kV according to patient size and/or use of iterative reconstruction technique. COMPARISON:  Chest x-ray 03/06/2024. FINDINGS: Cardiovascular: No significant vascular findings. Normal heart size. No pericardial effusion. There are atherosclerotic calcifications of the aorta. Mediastinum/Nodes: No enlarged mediastinal or axillary lymph nodes. Thyroid  gland, trachea, and esophagus demonstrate no significant findings. Lungs/Pleura: There are minimal clustered tree-in-bud opacities in the left lower lobe. There is focal ill-defined nodular density with surrounding ground-glass opacity in the right upper lobe measuring 10 x 7 by 7 mm with some adjacent pleural thickening/tethering. There is some scarring in the lingula. There is no pleural effusion or pneumothorax. There is a calcified granuloma in the left upper lobe. There is a noncalcified 3 mm nodule in the right upper lobe image 4/77. There is a noncalcified nodule in the right lower lobe measuring 5 mm image 4/104. Upper Abdomen: No acute abnormality. Cholecystectomy clips are present. There is a 2.3 cm cyst in the left kidney. Musculoskeletal: Degenerative  changes affect the spine. IMPRESSION: 1. Minimal clustered tree-in-bud opacities in the left lower lobe compatible with infectious/inflammatory process. 2. Ill-defined nodular density with surrounding ground-glass opacity in the right upper lobe measuring up to 10 mm. This may be infectious/inflammatory, but neoplasm is not excluded. Consider one of the following in 3 months for both low-risk and high-risk individuals: (a) repeat chest CT, (b) follow-up PET-CT, or (c) tissue sampling. This recommendation follows the consensus statement: Guidelines for Management of Incidental Pulmonary Nodules Detected on CT Images: From the Fleischner Society 2017; Radiology 2017; 284:228-243. 3. Additional small right lung nodules measuring up to 5 mm. 4. Aortic atherosclerosis. Aortic Atherosclerosis (ICD10-I70.0). Electronically Signed   By: Tyron Gallon M.D.   On: 03/13/2024 21:28   CT PELVIS W CONTRAST Result Date: 03/13/2024 CLINICAL DATA:  Anemia low blood pressure right-sided hip pain EXAM: CT PELVIS WITH CONTRAST TECHNIQUE: Multidetector CT imaging of the pelvis was performed using the standard protocol following the bolus administration of intravenous contrast. RADIATION DOSE REDUCTION: This exam was performed according to the departmental dose-optimization program which includes automated exposure control, adjustment of the mA and/or kV according to patient size and/or use of iterative reconstruction technique. CONTRAST:  75mL OMNIPAQUE  IOHEXOL  300 MG/ML  SOLN COMPARISON:  CT 10/28/2023, 11/23/2021 FINDINGS: Urinary Tract:  Partially obscured by right hip hardware artifact Bowel:  No acute bowel wall thickening Vascular/Lymphatic: Aortic atherosclerosis. No aneurysm. No suspicious lymph nodes Reproductive: Hysterectomy. Curvilinear density at the level of vaginal cuff is a chronic finding and presumably related to postsurgical change. Other:  Negative for pelvic effusion or free air. Musculoskeletal: Right hip  replacement with artifact. No acute fracture is seen. Edema within the subcutaneous soft tissues of the right posterior hip and gluteal region. Asymmetrical enlargement of the right gluteus muscles compared to the left, with oval slightly dense masslike area measuring 7.5 x 5.2 cm, consistent with intramuscular hematoma. Small focus of globular contrast, series 2, image 67. Some edema and asymmetrical enlargement of the right abductor muscles as well. IMPRESSION: 1. Right hip replacement with artifact. No acute osseous abnormality. 2. 7.5 cm suspected intramuscular hematoma within the right gluteus muscles with small focus of globular contrast, possible small focus of active bleeding. Edema within the subcutaneous soft tissues of the right posterolateral hip and gluteal region. 3. Aortic atherosclerosis. Aortic Atherosclerosis (ICD10-I70.0). Electronically Signed   By: Esmeralda Hedge M.D.   On: 03/13/2024 21:14   DG Hip Unilat  With Pelvis 2-3 Views Right  Result Date: 03/13/2024 CLINICAL DATA:  Status post fall with right hip pain EXAM: DG HIP (WITH OR WITHOUT PELVIS) 3V RIGHT COMPARISON:  Radiograph of the right hips dated 12/09/2023 FINDINGS: Postsurgical changes of right hip arthroplasty. Hardware appears intact and well seated. There is no evidence of hip fracture or dislocation. IMPRESSION: Postsurgical changes of right hip arthroplasty without evidence of acute fracture or dislocation. Electronically Signed   By: Limin  Xu M.D.   On: 03/13/2024 13:44           LOS: 2 days   Time spent= 35 mins    [name], MD [facility]  If 7PM-7AM, please contact night-coverage  03/15/2024, 12:02 PM

## 2024-03-15 NOTE — TOC Progression Note (Signed)
 Transition of Care [facility]) - Progression Note    Patient Details  Name: Kristen [name] MRN: [id number] Date of Birth: Apr 24, 1933  Transition of [facility] CM/SW Contact  Francella Barnett, Thersia [place]ax, RN Phone Number: 03/15/2024, 3:38 PM  Clinical Narrative:Patient/dtr [name] I agreement to fax out for ST SNF-has gone to [facility] in past(Preferred). Await bed offers.       Expected Discharge Plan: Skilled Nursing Facility Barriers to Discharge: Continued Medical Work up  Expected Discharge Plan and Services   Discharge Planning Services: CM Consult Post Acute Care Choice: Resumption of Svcs/PTA Provider Living arrangements for the past 2 months: Independent Living Facility                                       Social Determinants of Health (SDOH) Interventions SDOH Screenings   Food Insecurity: No Food Insecurity (03/14/2024)  Housing: Low Risk  (03/14/2024)  Transportation Needs: No Transportation Needs (03/14/2024)  Utilities: Not At Risk (03/14/2024)  Financial Resource Strain: Low Risk  (06/19/2019)   Received from [facility], [facility]  Social Connections: Socially Isolated (03/14/2024)  Stress: No Stress Concern Present (06/19/2019)   Received from [facility], [facility]  Tobacco Use: Low Risk  (03/13/2024)  Health Literacy: Medium Risk (02/01/2021)   Received from [facility], [facility]    Readmission Risk Interventions    03/14/2024    3:54 PM 12/28/2023    3:43 PM  Readmission Risk Prevention Plan  Transportation Screening Complete Complete  PCP or Specialist Appt within 3-5 Days  Complete  HRI or Home Care Consult  Complete  Palliative Care Screening  Not Applicable  Medication Review (RN Care Manager) Complete Complete  PCP or Specialist appointment within 3-5 days of discharge Complete   HRI or Home Care Consult Complete   SW Recovery Care/Counseling Consult Complete   Palliative Care Screening Not Applicable   Skilled Nursing Facility  Complete

## 2024-03-15 NOTE — NC FL2 (Signed)
 [place]  MEDICAID FL2 LEVEL OF CARE FORM     IDENTIFICATION  Patient Name: Kristen [name] Birthdate: August 17, 1933 Sex: female Admission Date (Current Location): 03/13/2024  [facility] and [place] Number:  Producer, television/film/video and Address:  [facility],  [address] [place], [place] [address]      Provider Number: [id number]  Attending Physician Name and Address:  [name], MD  Relative Name and Phone Number:  Amado Bacon [phone]    Current Level of Care: Hospital Recommended Level of Care: Skilled Nursing Facility Prior Approval Number:    Date Approved/Denied:   PASRR Number: [id number]  Discharge Plan: SNF    Current Diagnoses: Patient Active Problem List   Diagnosis Date Noted   Hyponatremia 03/14/2024   Intramuscular hematoma 03/13/2024   Anxiety 03/13/2024   Adenocarcinoma of lung (HCC) 12/29/2023   Hypomagnesemia 12/29/2023   Trigeminal neuralgia 12/29/2023   E-coli UTI 12/29/2023   Intractable nausea 12/26/2023   SIADH (syndrome of inappropriate ADH production) (HCC) 12/24/2023   S/P total right hip arthroplasty 10/30/2023   Closed right hip fracture, initial encounter (HCC) 10/29/2023   History of pulmonary embolism 10/29/2023   Renal cyst, right 10/29/2023   Leukocytosis 10/29/2023   DOE (dyspnea on exertion) 05/20/2022   Chronic rhinitis 05/20/2022   Dysphagia 05/20/2022   Pulmonary embolism (HCC) 11/26/2021   Acute pulmonary embolism without acute cor pulmonale (HCC) 11/23/2021   Pulmonary mass 11/23/2021   Chronic pain disorder 11/23/2021   CKD stage 3a, GFR 45-59 ml/min (HCC) 11/23/2021   DNR (do not resuscitate) 11/23/2021   Tricuspid regurgitation 10/07/2021   Pulmonary hypertension, unspecified (HCC) 10/07/2021   Hyperlipidemia 10/07/2021   Essential hypertension 10/07/2021   Atypical chest pain 10/07/2021    Orientation RESPIRATION BLADDER Height & Weight     Self, Time, Situation, Place  Normal Continent Weight:  49.9 kg Height:  5\' 2"  (157.5 cm)  BEHAVIORAL SYMPTOMS/MOOD NEUROLOGICAL BOWEL NUTRITION STATUS      Continent Diet (Regular)  AMBULATORY STATUS COMMUNICATION OF NEEDS Skin   Limited Assist Verbally Normal                       Personal Care Assistance Level of Assistance  Bathing, Feeding, Dressing Bathing Assistance: Limited assistance Feeding assistance: Limited assistance Dressing Assistance: Limited assistance     Functional Limitations Info  Sight, Hearing, Speech Sight Info: Impaired (eyeglasses) Hearing Info: Adequate Speech Info: Adequate    SPECIAL CARE FACTORS FREQUENCY  PT (By licensed PT), OT (By licensed OT)     PT Frequency: 5x week OT Frequency: 5x week            Contractures Contractures Info: Not present    Additional Factors Info  Code Status, Allergies Code Status Info: DNR Allergies Info: Penicillins, Tetracyclines & Related, Sulfa Antibiotics           Current Medications (03/15/2024):  This is the current hospital active medication list Current Facility-Administered Medications  Medication Dose Route Frequency Provider Last Rate Last Admin   0.9 %  sodium chloride  infusion   Intravenous Continuous Amin, Ankit C, MD 75 mL/hr at 03/15/24 1506 Infusion Verify at 03/15/24 1506   acetaminophen  (TYLENOL ) tablet 650 mg  650 mg Oral Q6H PRN Opyd, Timothy S, MD   650 mg at 03/15/24 1423   Or   acetaminophen  (TYLENOL ) suppository 650 mg  650 mg Rectal Q6H PRN Opyd, Timothy S, MD       diclofenac  Sodium (  VOLTAREN ) 1 % topical gel 4 g  4 g Topical QID PRN Amin, Ankit C, MD   4 g at 03/15/24 1401   docusate sodium  (COLACE) capsule 100 mg  100 mg Oral BID Amin, Ankit C, MD   100 mg at 03/15/24 1035   feeding supplement (ENSURE ENLIVE / ENSURE PLUS) liquid 237 mL  237 mL Oral BID BM Opyd, Timothy S, MD   237 mL at 03/15/24 1401   guaiFENesin (ROBITUSSIN) 100 MG/5ML liquid 5 mL  5 mL Oral Q4H PRN Amin, Ankit C, MD       hydrALAZINE  (APRESOLINE )  injection 10 mg  10 mg Intravenous Q4H PRN Amin, Ankit C, MD       HYDROmorphone  (DILAUDID ) tablet 2 mg  2 mg Oral Q8H PRN Opyd, Timothy S, MD   2 mg at 03/15/24 1308   hydrOXYzine  (ATARAX ) tablet 25 mg  25 mg Oral QHS PRN Opyd, Timothy S, MD   25 mg at 03/14/24 2025   hydrOXYzine  (ATARAX ) tablet 25 mg  25 mg Oral QHS Amin, Ankit C, MD       ipratropium-albuterol  (DUONEB) 0.5-2.5 (3) MG/3ML nebulizer solution 3 mL  3 mL Nebulization Q4H PRN Amin, Ankit C, MD       losartan  (COZAAR ) tablet 100 mg  100 mg Oral Daily Amin, Ankit C, MD   100 mg at 03/15/24 6578   melatonin tablet 3 mg  3 mg Oral QHS PRN Opyd, Timothy S, MD   3 mg at 03/14/24 2025   metoprolol tartrate (LOPRESSOR) injection 5 mg  5 mg Intravenous Q4H PRN Amin, Ankit C, MD       ondansetron  (ZOFRAN ) tablet 4 mg  4 mg Oral Q6H PRN Opyd, Timothy S, MD       Or   ondansetron  (ZOFRAN ) injection 4 mg  4 mg Intravenous Q6H PRN Opyd, Timothy S, MD       pantoprazole  (PROTONIX ) EC tablet 40 mg  40 mg Oral Daily Opyd, Timothy S, MD   40 mg at 03/15/24 0937   polyethylene glycol (MIRALAX  / GLYCOLAX ) packet 17 g  17 g Oral Daily Amin, Ankit C, MD   17 g at 03/15/24 1035   senna-docusate (Senokot-S) tablet 1 tablet  1 tablet Oral QHS PRN Opyd, Timothy S, MD   1 tablet at 03/14/24 2025   sodium chloride  flush (NS) 0.9 % injection 3 mL  3 mL Intravenous Q12H Opyd, Timothy S, MD   3 mL at 03/15/24 4696   venlafaxine  XR (EFFEXOR -XR) 24 hr capsule 37.5 mg  37.5 mg Oral Q breakfast Opyd, Timothy S, MD   37.5 mg at 03/15/24 2952     Discharge Medications: Please see discharge summary for a list of discharge medications.  Relevant Imaging Results:  Relevant Lab Results:   Additional Information [id number], [place], [place]

## 2024-03-15 NOTE — Evaluation (Signed)
 Physical Therapy Evaluation Patient Details Name: Kristen [name] MRN: [id number] DOB: [birth year] Today's Date: 03/15/2024  History of Present Illness  89 yo female admitted with R hip/gluteal contusion/hematoma, fall, anemia. Hx of hernia repair, TKA, CKD, Sz d/o, anemia, OA, lung nodules, R THA 10/2023, COPD, anemia, tremor, chronic pain  Clinical Impression  On eval, pt required Min A for mobility. She walked ~60 feet with a RW. Pt presents with general weakness, decreased activity tolerance, and impaired gait and balance. Pt reports pain in bil knees and R gluteal/hip area. No family was present but patient's intermittent aide/caregiver was present during session. Caregiver mentioned that pt's daughter may want some post acute rehab after this hospital stay. Pt tends to defer to family/caregiver for decision-making. Pt did state she would like to return home to her Ind Living apt. Will plan to follow and progress activity as tolerated. Patient will benefit from continued inpatient follow up therapy, <3 hours/day       If plan is discharge home, recommend the following: A little help with walking and/or transfers;A little help with bathing/dressing/bathroom;Assistance with cooking/housework;Assist for transportation;Help with stairs or ramp for entrance   Can travel by private vehicle   Yes    Equipment Recommendations None recommended by PT  Recommendations for Other Services       Functional Status Assessment Patient has had a recent decline in their functional status and demonstrates the ability to make significant improvements in function in a reasonable and predictable amount of time.     Precautions / Restrictions Precautions Precautions: Fall Restrictions Weight Bearing Restrictions Per Provider Order: No      Mobility  Bed Mobility               General bed mobility comments: oob in recliner    Transfers Overall transfer level: Needs assistance Equipment used:  Rolling walker (2 wheels) Transfers: Sit to/from Stand Sit to Stand: Contact guard assist           General transfer comment: cues for safety, technique, hand placement    Ambulation/Gait Ambulation/Gait assistance: Min assist Gait Distance (Feet): 60 Feet Assistive device: Rolling walker (2 wheels) Gait Pattern/deviations: Step-through pattern, Decreased stride length       General Gait Details: Pt reported pain in bil knees and R gluteal/hip area. She denied dizziness. Mildly unsteady as distance increased.  Stairs            Wheelchair Mobility     Tilt Bed    Modified Rankin (Stroke Patients Only)       Balance Overall balance assessment: Needs assistance         Standing balance support: Bilateral upper extremity supported, During functional activity, Reliant on assistive device for balance Standing balance-Leahy Scale: Poor                               Pertinent Vitals/Pain Pain Assessment Pain Assessment: Faces Faces Pain Scale: Hurts even more Pain Location: bil kness, R gluteal/hip area Pain Descriptors / Indicators: Aching Pain Intervention(s): Limited activity within patient's tolerance, Monitored during session, Repositioned    Home Living Family/patient expects to be discharged to:: Private residence Living Arrangements: Alone Available Help at Discharge: Family;Available PRN/intermittently;Personal care attendant (aide 2 hours in mornings) Type of Home: Apartment Home Access: Level entry       Home Layout: One level Home Equipment: Grab bars - tub/shower;Grab bars - toilet;Shower seat;Rollator (4 wheels);Cane - single  point;Hand held shower head Additional Comments: Apartment at Independent Living [facility])    Prior Function Prior Level of Function : Independent/Modified Independent             Mobility Comments: Walking with rollator vs cane, walks to/from dining hall, takes meals back to her room; ADLs  Comments: manages ADLs, breakfast in her apt; manages meds and fiances     Extremity/Trunk Assessment   Upper Extremity Assessment Upper Extremity Assessment: Defer to OT evaluation    Lower Extremity Assessment Lower Extremity Assessment: Generalized weakness    Cervical / Trunk Assessment Cervical / Trunk Assessment: Kyphotic  Communication   Communication Communication: No apparent difficulties    Cognition Arousal: Alert Behavior During Therapy: WFL for tasks assessed/performed   PT - Cognitive impairments: No family/caregiver present to determine baseline                       PT - Cognition Comments: tends to defer to aide present in room. Following commands: Intact       Cueing Cueing Techniques: Verbal cues     General Comments      Exercises     Assessment/Plan    PT Assessment Patient needs continued PT services  PT Problem List Decreased strength;Decreased activity tolerance;Decreased balance;Decreased mobility;Decreased knowledge of use of DME;Pain       PT Treatment Interventions DME instruction;Gait training;Functional mobility training;Therapeutic activities;Therapeutic exercise;Patient/family education;Balance training    PT Goals (Current goals can be found in the Care Plan section)  Acute Rehab PT Goals Patient Stated Goal: home PT Goal Formulation: Patient unable to participate in goal setting Time For Goal Achievement: 03/29/24 Potential to Achieve Goals: Good    Frequency Min 3X/week     Co-evaluation               AM-PAC PT "6 Clicks" Mobility  Outcome Measure Help needed turning from your back to your side while in a flat bed without using bedrails?: A Little Help needed moving from lying on your back to sitting on the side of a flat bed without using bedrails?: A Little Help needed moving to and from a bed to a chair (including a wheelchair)?: A Little Help needed standing up from a chair using your arms (e.g.,  wheelchair or bedside chair)?: A Little Help needed to walk in hospital room?: A Little Help needed climbing 3-5 steps with a railing? : Total 6 Click Score: 16    End of Session   Activity Tolerance: Patient tolerated treatment well;Patient limited by pain Patient left: in chair;with call bell/phone within reach;with nursing/sitter in room (personal aide in room with patient until daughter arrives)   PT Visit Diagnosis: Unsteadiness on feet (R26.81);History of falling (Z91.81);Muscle weakness (generalized) (M62.81);Pain    Time: 6644-0347 PT Time Calculation (min) (ACUTE ONLY): 14 min   Charges:   PT Evaluation $PT Eval Low Complexity: 1 Low   PT General Charges $$ ACUTE PT VISIT: 1 Visit           Tanda Falter, PT Acute Rehabilitation  Office: [phone]

## 2024-03-16 DIAGNOSIS — T148XXA Other injury of unspecified body region, initial encounter: Secondary | ICD-10-CM | POA: Diagnosis not present

## 2024-03-16 LAB — CBC
HCT: 27.9 % — ABNORMAL LOW (ref 36.0–46.0)
Hemoglobin: 9.3 g/dL — ABNORMAL LOW (ref 12.0–15.0)
MCH: 31.3 pg (ref 26.0–34.0)
MCHC: 33.3 g/dL (ref 30.0–36.0)
MCV: 93.9 fL (ref 80.0–100.0)
Platelets: 212 10*3/uL (ref 150–400)
RBC: 2.97 MIL/uL — ABNORMAL LOW (ref 3.87–5.11)
RDW: 13.2 % (ref 11.5–15.5)
WBC: 8.9 10*3/uL (ref 4.0–10.5)
nRBC: 0 % (ref 0.0–0.2)

## 2024-03-16 LAB — BASIC METABOLIC PANEL WITH GFR
Anion gap: 7 (ref 5–15)
Anion gap: 8 (ref 5–15)
Anion gap: 8 (ref 5–15)
BUN: 19 mg/dL (ref 8–23)
BUN: 22 mg/dL (ref 8–23)
BUN: 22 mg/dL (ref 8–23)
CO2: 24 mmol/L (ref 22–32)
CO2: 25 mmol/L (ref 22–32)
CO2: 26 mmol/L (ref 22–32)
Calcium: 7.9 mg/dL — ABNORMAL LOW (ref 8.9–10.3)
Calcium: 8.2 mg/dL — ABNORMAL LOW (ref 8.9–10.3)
Calcium: 8.3 mg/dL — ABNORMAL LOW (ref 8.9–10.3)
Chloride: 97 mmol/L — ABNORMAL LOW (ref 98–111)
Chloride: 98 mmol/L (ref 98–111)
Chloride: 99 mmol/L (ref 98–111)
Creatinine, Ser: 0.62 mg/dL (ref 0.44–1.00)
Creatinine, Ser: 0.87 mg/dL (ref 0.44–1.00)
Creatinine, Ser: 0.87 mg/dL (ref 0.44–1.00)
GFR, Estimated: >60 mL/min (ref >60)
GFR, Estimated: >60 mL/min (ref >60)
GFR, Estimated: >60 mL/min (ref >60)
Glucose, Bld: 109 mg/dL — ABNORMAL HIGH (ref 70–99)
Glucose, Bld: 119 mg/dL — ABNORMAL HIGH (ref 70–99)
Glucose, Bld: 126 mg/dL — ABNORMAL HIGH (ref 70–99)
Potassium: 4.3 mmol/L (ref 3.5–5.1)
Potassium: 4.4 mmol/L (ref 3.5–5.1)
Potassium: 4.7 mmol/L (ref 3.5–5.1)
Sodium: 129 mmol/L — ABNORMAL LOW (ref 135–145)
Sodium: 131 mmol/L — ABNORMAL LOW (ref 135–145)
Sodium: 132 mmol/L — ABNORMAL LOW (ref 135–145)

## 2024-03-16 LAB — MAGNESIUM: Magnesium: 1.7 mg/dL (ref 1.7–2.4)

## 2024-03-16 MED ORDER — DOCUSATE SODIUM 50 MG PO CAPS: 100.0000 mg | ORAL_CAPSULE | Freq: Every day | ORAL | Status: AC | PRN

## 2024-03-16 MED ORDER — HYDROMORPHONE HCL 2 MG PO TABS: 2.0000 mg | ORAL_TABLET | Freq: Two times a day (BID) | ORAL | 0 refills | Status: AC

## 2024-03-16 NOTE — TOC Progression Note (Signed)
 Transition of Care [facility]) - Progression Note    Patient Details  Name: Kristen [name] MRN: [id number] Date of Birth: [birth year]  [facility]) CM/SW Contact  Branston Halsted, Thersia Flax, RN Phone Number: 03/16/2024, 11:52 AM  Clinical Narrative:Await choice prior auth.       Expected Discharge Plan: Skilled Nursing Facility Barriers to Discharge: Continued Medical Work up  Expected Discharge Plan and Services   Discharge Planning Services: CM Consult Post Acute Care Choice: Resumption of Svcs/PTA Provider Living arrangements for the past 2 months: Independent Living Facility                                       Social Determinants of Health (SDOH) Interventions SDOH Screenings   Food Insecurity: No Food Insecurity (03/14/2024)  Housing: Low Risk  (03/14/2024)  Transportation Needs: No Transportation Needs (03/14/2024)  Utilities: Not At Risk (03/14/2024)  Financial Resource Strain: Low Risk  (06/19/2019)   Received from [facility], [facility]  Social Connections: Socially Isolated (03/14/2024)  Stress: No Stress Concern Present (06/19/2019)   Received from [facility], [facility]  Tobacco Use: Low Risk  (03/13/2024)  Health Literacy: Medium Risk (02/01/2021)   Received from [facility], [facility]    Readmission Risk Interventions    03/14/2024    3:54 PM 12/28/2023    3:43 PM  Readmission Risk Prevention Plan  Transportation Screening Complete Complete  PCP or Specialist Appt within 3-5 Days  Complete  HRI or Home Care Consult  Complete  Palliative Care Screening  Not Applicable  Medication Review (RN Care Manager) Complete Complete  PCP or Specialist appointment within 3-5 days of discharge Complete   HRI or Home Care Consult Complete   SW Recovery Care/Counseling Consult Complete   Palliative Care Screening Not Applicable   Skilled Nursing Facility Complete

## 2024-03-16 NOTE — Progress Notes (Signed)
 PROGRESS NOTE    Kristen [name]  ZOX:[id number] DOB: [birth year] DOA: 03/13/2024 PCP: Benedetta Bradley, MD    Brief Narrative:  89 y.o. female with medical history significant for COPD, chronic pain, hypertension, hyperlipidemia, depression, anxiety, chronic hyponatremia, lung nodules concerning for adenocarcinoma, and history of PE on Eliquis  who presents with right hip pain after a fall 2 days ago.  Found to have intramuscular hematoma and hyponatremia.  Seen by orthopedic.  Currently Eliquis  is on hold.   Patient fell onto her right hip 2 days ago and has had increasing pain and swelling since then.  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  Will continue to hold Eliquis  for now. -Weightbearing as tolerated    Hyponatremia  -Admission sodium 124, appears to be from dehydration, gentle hydration and trend sodium levels.  Acute blood loss anemia - Baseline hemoglobin 12.0, admission hemoglobin 9.5 trended down to 7.4.  Status post 1 unit PRBC transfusion 5/20.   Hx of PE ; Feb 2023 -Previously determined she should be on Eliquis  lifelong but given this fall and hematoma, will need to hold it until hemoglobin stabilizes.   Lung nodules  - Adenocarcinoma is suspected, followed by pulmonology in outpatient setting   -see CT scan below   History of hypertension  -Now elevated therefore will resume home losartan    CKD 3A   -From baseline hemoglobin 1.0    Depression, anxiety  - Pristiq, hydroxyzine      PT/OT-SNF, consult TOC   DVT prophylaxis: SCDs Start: 03/13/24 2333      Code Status: Limited: Do not attempt resuscitation (DNR) -DNR-LIMITED -Do Not Intubate/DNI  Family Communication: Daughter at bedside Status is: Inpatient Pending SNF placement   Subjective: Patient seen and examined, does not any complaints.  Daughter is present at bedside.  They agree that for now we will hold off on Eliquis  for 2 weeks but in the future they should discuss with outpatient provider to consider risks and benefit before resuming it.  Examination:  General exam: Appears calm and comfortable  Respiratory system: Clear to auscultation. Respiratory effort normal. Cardiovascular system: S1 & S2 heard, RRR. No JVD, murmurs, rubs, gallops or clicks. No pedal edema. Gastrointestinal system: Abdomen is nondistended, soft and nontender. No organomegaly or masses felt. Normal bowel sounds heard. Central nervous system: Alert and oriented. No focal neurological deficits. Extremities: Symmetric 5 x 5 power. Skin: No rashes, lesions or ulcers Psychiatry: Judgement and insight appear normal. Mood & affect appropriate.                Diet Orders (From admission, onward)     Start     Ordered   03/13/24 2333  Diet regular Room service appropriate? Yes; Fluid consistency: Thin; Fluid restriction: 1200 mL Fluid  Diet effective now       Question Answer Comment  Room service appropriate? Yes   Fluid consistency: Thin   Fluid restriction: 1200  mL Fluid      03/13/24 2334            Objective: Vitals:   03/15/24 0346 03/15/24 1240 03/15/24 2014 03/16/24 0623  BP: (!) 171/68 120/62 (!) 134/49 (!) 162/41  Pulse: 65 (!) 58 60 64  Resp:  20 16 16   Temp: 97.7 F (36.5 C) 97.6 F (36.4 C) 98.1 F (36.7 C) 97.9 F (36.6 C)  TempSrc: Oral Oral Oral Oral  SpO2: 99% 97% 96% 100%  Weight:      Height:         Intake/Output Summary (Last 24 hours) at 03/16/2024 1101 Last data filed at 03/16/2024 0500 Gross per 24 hour  Intake 986.44 ml  Output 500 ml  Net 486.44 ml   Filed Weights   03/13/24 1129  Weight: 49.9 kg    Scheduled Meds:  docusate sodium   100 mg Oral BID   feeding supplement  237 mL Oral BID BM   hydrOXYzine   25 mg Oral QHS   losartan   100 mg Oral Daily   pantoprazole   40 mg Oral Daily   polyethylene glycol  17 g Oral Daily   sodium chloride  flush  3 mL Intravenous Q12H   venlafaxine  XR  37.5 mg Oral Q breakfast   Continuous Infusions:  sodium chloride  Stopped (03/15/24 2303)    Nutritional status     Body mass index is 20.12 kg/m.  Data Reviewed:   CBC: Recent Labs  Lab 03/13/24 2341 03/14/24 1148 03/15/24 0047 03/15/24 1200 03/15/24 2251  WBC 10.1 8.0 9.1 10.7* 8.6  HGB 7.4* 7.4* 8.8* 11.2* 8.2*  HCT 22.1* 22.0* 26.1* 34.1* 24.8*  MCV 92.5 94.4 91.3 96.1 93.2  PLT 183 191 180 222 183   Basic Metabolic Panel: Recent Labs  Lab 03/15/24 0047 03/15/24 0955 03/15/24 1654 03/16/24 0012 03/16/24 0844  NA 127* 131* 130* 129* 132*  K 4.0 4.5 4.1 4.4 4.3  CL 96* 98 99 98 99  CO2 24 25 26 24 25   GLUCOSE 99 129* 128* 119* 109*  BUN 26* 21 21 22 19   CREATININE 0.97 0.97 0.95 0.62 0.87  CALCIUM  7.9* 8.5* 7.9* 7.9* 8.3*  MG  --   --   --   --  1.7   GFR: Estimated Creatinine Clearance: 35.2 mL/min (by C-G formula based on SCr of 0.87 mg/dL). Liver Function Tests: Recent Labs  Lab 03/13/24 1253  AST 18  ALT 13  ALKPHOS 69  BILITOT 1.1  PROT 6.0*  ALBUMIN  3.5   No results for input(s): "LIPASE", "AMYLASE" in the last 168 hours. No results for input(s): "AMMONIA" in the last 168 hours. Coagulation Profile: No results for input(s): "INR", "PROTIME" in the last 168 hours. Cardiac Enzymes: No results for input(s): "CKTOTAL", "CKMB", "CKMBINDEX", "TROPONINI" in the last 168 hours. BNP (last 3 results) No results for input(s): "PROBNP" in the  last 8760 hours. HbA1C: No results for input(s): "HGBA1C" in the last 72 hours. CBG: Recent Labs  Lab 03/13/24 1141  GLUCAP 161*   Lipid Profile: No results for input(s): "CHOL", "HDL", "LDLCALC", "TRIG", "CHOLHDL", "LDLDIRECT" in the last 72 hours. Thyroid  Function Tests: No results for input(s): "TSH", "T4TOTAL", "FREET4", "T3FREE", "THYROIDAB" in the last 72 hours. Anemia Panel: No results for input(s): "VITAMINB12", "FOLATE", "FERRITIN", "TIBC", "IRON", "RETICCTPCT" in the last 72 hours. Sepsis Labs: Recent Labs  Lab 03/13/24 2341  PROCALCITON <0.10    No results found for this or any previous visit (from the past 240 hours).  Radiology Studies: No results found.         LOS: 3 days   Time spent= 35 mins    [name], MD [facility]  If 7PM-7AM, please contact night-coverage  03/16/2024, 11:01 AM

## 2024-03-16 NOTE — TOC Progression Note (Addendum)
 Transition of Care [facility]) - Progression Note    Patient Details  Name: Kristen [name] MRN: [id number] Date of Birth: 13-Nov-1932  Transition of Care [facility]) CM/SW Contact  Shiron Whetsel, Thersia Flax, RN Phone Number: 03/16/2024, 1:57 PM  Clinical Narrative: Daivd Dub) chose [address] rep Daril Edge aware. Pending [employer] [id number] auth.      Expected Discharge Plan: Skilled Nursing Facility Barriers to Discharge: Insurance Authorization  Expected Discharge Plan and Services   Discharge Planning Services: CM Consult Post Acute Care Choice: Resumption of Svcs/PTA Provider Living arrangements for the past 2 months: Independent Living Facility                                       Social Determinants of Health (SDOH) Interventions SDOH Screenings   Food Insecurity: No Food Insecurity (03/14/2024)  Housing: Low Risk  (03/14/2024)  Transportation Needs: No Transportation Needs (03/14/2024)  Utilities: Not At Risk (03/14/2024)  Financial Resource Strain: Low Risk  (06/19/2019)   Received from [facility], [facility]  Social Connections: Socially Isolated (03/14/2024)  Stress: No Stress Concern Present (06/19/2019)   Received from [facility], [facility]  Tobacco Use: Low Risk  (03/13/2024)  Health Literacy: Medium Risk (02/01/2021)   Received from [facility], [facility]    Readmission Risk Interventions    03/14/2024    3:54 PM 12/28/2023    3:43 PM  Readmission Risk Prevention Plan  Transportation Screening Complete Complete  PCP or Specialist Appt within 3-5 Days  Complete  HRI or Home Care Consult  Complete  Palliative Care Screening  Not Applicable  Medication Review (RN Care Manager) Complete Complete  PCP or Specialist appointment within 3-5 days of discharge Complete   HRI or Home Care Consult Complete   SW Recovery Care/Counseling Consult Complete   Palliative Care Screening Not Applicable   Skilled Nursing Facility Complete

## 2024-03-16 NOTE — Evaluation (Signed)
 Occupational Therapy Evaluation Patient Details Name: Kristen [name] MRN: [id number] DOB: 11-20-1932 Today's Date: 03/16/2024   History of Present Illness   89 yo female admitted with R hip/gluteal contusion/hematoma, fall, anemia. Hx of hernia repair, TKA, CKD, Sz d/o, anemia, OA, lung nodules, R THA 10/2023, COPD, anemia, tremor, chronic pain     Clinical Impressions Prior to hospital admission, pt was mod independent with use of 4WW or SPC. Pt lives alone at an ILF [facility]), PCA assists 2x hours daily with IADLs. Pt currently requires CGA for bed mobility, MIN A progressing to CGA for functional mobility with RW, and performs stand pivot transfers to [facility], cues for hand placement and safety, CGA for standing balance and MIN A for clothing management/hygiene. Presents with decreased insight into deficits, poor tolerance to activity with impaired strength, balance and cognition. Pt would benefit from skilled OT services to address noted impairments and functional limitations (see below for any additional details) in order to maximize safety and independence while minimizing falls risk and caregiver burden. Anticipate the need for follow up OT services upon acute hospital DC. Patient will benefit from continued inpatient follow up therapy, <3 hours/day      If plan is discharge home, recommend the following:   A little help with walking and/or transfers;A little help with bathing/dressing/bathroom;Assistance with cooking/housework;Direct supervision/assist for medications management;Direct supervision/assist for financial management;Assist for transportation;Supervision due to cognitive status     Functional Status Assessment   Patient has had a recent decline in their functional status and demonstrates the ability to make significant improvements in function in a reasonable and predictable amount of time.     Equipment Recommendations   None recommended by OT (defer to next LOC)       Precautions/Restrictions   Precautions Precautions: Fall Recall of Precautions/Restrictions: Impaired Restrictions Weight Bearing Restrictions Per Provider Order: Yes RLE Weight Bearing Per Provider Order: Weight bearing as tolerated     Mobility Bed Mobility Overal bed mobility: Needs Assistance Bed Mobility: Supine to Sit     Supine to sit: Contact guard     General bed mobility comments: increased time needed    Transfers Overall transfer level: Needs assistance Equipment used: Rolling walker (2 wheels) Transfers: Sit to/from Stand, Bed to chair/wheelchair/BSC Sit to Stand: Min assist, Contact guard assist     Step pivot transfers: Contact guard assist     General transfer comment: minA - CGA for safe transfer techniuqes. cues for hand placement, pt with poor implementation      Balance Overall balance assessment: Needs assistance Sitting-balance support: No upper extremity supported, Feet supported Sitting balance-Leahy Scale: Good Sitting balance - Comments: steady reaching outside BOS   Standing balance support: Bilateral upper extremity supported, During functional activity, Reliant on assistive device for balance Standing balance-Leahy Scale: Poor Standing balance comment: decreased tolerance                           ADL either performed or assessed with clinical judgement   ADL Overall ADL's : Needs assistance/impaired Eating/Feeding: Independent;Sitting   Grooming: Wash/dry hands;Sitting;Modified independent   Upper Body Bathing: Sitting;Supervision/ safety   Lower Body Bathing: Minimal assistance;Sit to/from stand   Upper Body Dressing : Sitting;Supervision/safety   Lower Body Dressing: Minimal assistance;Sit to/from stand;Sitting/lateral leans;Cueing for safety;Cueing for sequencing   Toilet Transfer: Minimal assistance;BSC/3in1;Rolling walker (2 wheels) Toilet Transfer Details (indicate cue type and reason): t/f BSC due to  urgency. RW placed in  front of pt, pt self-selects not to use despite cues to redirect Toileting- Clothing Manipulation and Hygiene: Sit to/from stand;Minimal assistance Toileting - Clothing Manipulation Details (indicate cue type and reason): underwear and gown; minA to pull over hips due to poor standing balance     Functional mobility during ADLs: Contact guard assist;Rolling walker (2 wheels);Cueing for sequencing;Cueing for safety General ADL Comments: Completes transfer to [facility], toileting tasks, and RW to walk several steps to recliner. Cues to step back until back of knees touches chair; pt attempting to sit too soon, requiring cues to redirect     Vision Baseline Vision/History: 1 Wears glasses Ability to See in Adequate Light: 0 Adequate Patient Visual Report: No change from baseline              Pertinent Vitals/Pain Pain Assessment Pain Assessment: No/denies pain     Extremity/Trunk Assessment Upper Extremity Assessment Upper Extremity Assessment: Generalized weakness   Lower Extremity Assessment Lower Extremity Assessment: Defer to PT evaluation;Generalized weakness   Cervical / Trunk Assessment Cervical / Trunk Assessment: Kyphotic   Communication Communication Communication: No apparent difficulties   Cognition Arousal: Alert Behavior During Therapy: WFL for tasks assessed/performed Cognition: Cognition impaired     Awareness: Intellectual awareness impaired Memory impairment (select all impairments): Short-term memory, Working memory Attention impairment (select first level of impairment): Selective attention Executive functioning impairment (select all impairments): Problem solving, Reasoning, Sequencing OT - Cognition Comments: Decreased insight into deficits                 Following commands: Intact       Cueing  General Comments   Cueing Techniques: Verbal cues              Home Living Family/patient expects to be discharged to::  Private residence (Independent Living) Living Arrangements: Alone Available Help at Discharge: Family;Available PRN/intermittently;Personal care attendant Type of Home: Apartment Home Access: Level entry     Home Layout: One level     Bathroom Shower/Tub: Producer, television/film/video: Handicapped height Bathroom Accessibility: Yes   Home Equipment: Grab bars - tub/shower;Grab bars - toilet;Shower seat;Rollator (4 wheels);Cane - single point;Hand held shower head   Additional Comments: Apartment at Independent Living [employer])      Prior Functioning/Environment Prior Level of Function : Independent/Modified Independent             Mobility Comments: Walking with rollator vs cane, walks to/from dining hall, takes meals back to her room; daughter reports that she often refuses to use AD which causes falls ADLs Comments: manages ADLs, breakfast in her apt; manages meds and fiances    OT Problem List: Decreased strength;Decreased range of motion;Decreased activity tolerance;Impaired balance (sitting and/or standing);Decreased cognition;Decreased safety awareness;Decreased knowledge of use of DME or AE   OT Treatment/Interventions: Self-care/ADL training;Neuromuscular education;Therapeutic exercise;Energy conservation;DME and/or AE instruction;Therapeutic activities;Cognitive remediation/compensation;Patient/family education;Balance training      OT Goals(Current goals can be found in the care plan section)   Acute Rehab OT Goals OT Goal Formulation: With patient/family Time For Goal Achievement: 03/30/24 Potential to Achieve Goals: Fair   OT Frequency:  Min 2X/week       AM-PAC OT "6 Clicks" Daily Activity     Outcome Measure Help from another person eating meals?: None Help from another person taking care of personal grooming?: None Help from another person toileting, which includes using toliet, bedpan, or urinal?: A Little Help from another person bathing  (including washing, rinsing, drying)?: A Little Help  from another person to put on and taking off regular upper body clothing?: A Little Help from another person to put on and taking off regular lower body clothing?: A Little 6 Click Score: 20   End of Session Equipment Utilized During Treatment: Rolling walker (2 wheels);Gait belt Nurse Communication: Mobility status  Activity Tolerance: Patient tolerated treatment well Patient left: in chair;with call bell/phone within reach;with chair alarm set;with family/visitor present  OT Visit Diagnosis: Repeated falls (R29.6);Muscle weakness (generalized) (M62.81);Unsteadiness on feet (R26.81)                Time: 1610-9604 OT Time Calculation (min): 21 min Charges:  OT General Charges $OT Visit: 1 Visit OT Evaluation $OT Eval Low Complexity: 1 Low OT Treatments $Self Care/Home Management : 8-22 mins Bernardino Dowell L. Ethelyne Erich, OTR/L  03/16/24, 12:09 PM

## 2024-03-17 DIAGNOSIS — N1831 Chronic kidney disease, stage 3a: Secondary | ICD-10-CM | POA: Diagnosis not present

## 2024-03-17 DIAGNOSIS — M7981 Nontraumatic hematoma of soft tissue: Secondary | ICD-10-CM | POA: Diagnosis not present

## 2024-03-17 DIAGNOSIS — R2689 Other abnormalities of gait and mobility: Secondary | ICD-10-CM | POA: Diagnosis not present

## 2024-03-17 DIAGNOSIS — J449 Chronic obstructive pulmonary disease, unspecified: Secondary | ICD-10-CM | POA: Diagnosis not present

## 2024-03-17 DIAGNOSIS — R262 Difficulty in walking, not elsewhere classified: Secondary | ICD-10-CM | POA: Diagnosis not present

## 2024-03-17 DIAGNOSIS — G479 Sleep disorder, unspecified: Secondary | ICD-10-CM | POA: Diagnosis not present

## 2024-03-17 DIAGNOSIS — D62 Acute posthemorrhagic anemia: Secondary | ICD-10-CM | POA: Diagnosis not present

## 2024-03-17 DIAGNOSIS — G894 Chronic pain syndrome: Secondary | ICD-10-CM | POA: Diagnosis not present

## 2024-03-17 DIAGNOSIS — Z9181 History of falling: Secondary | ICD-10-CM | POA: Diagnosis not present

## 2024-03-17 DIAGNOSIS — Z7401 Bed confinement status: Secondary | ICD-10-CM | POA: Diagnosis not present

## 2024-03-17 DIAGNOSIS — E870 Hyperosmolality and hypernatremia: Secondary | ICD-10-CM | POA: Diagnosis not present

## 2024-03-17 DIAGNOSIS — E222 Syndrome of inappropriate secretion of antidiuretic hormone: Secondary | ICD-10-CM | POA: Diagnosis not present

## 2024-03-17 DIAGNOSIS — R531 Weakness: Secondary | ICD-10-CM | POA: Diagnosis not present

## 2024-03-17 DIAGNOSIS — S72009D Fracture of unspecified part of neck of unspecified femur, subsequent encounter for closed fracture with routine healing: Secondary | ICD-10-CM | POA: Diagnosis not present

## 2024-03-17 DIAGNOSIS — Z86711 Personal history of pulmonary embolism: Secondary | ICD-10-CM | POA: Diagnosis not present

## 2024-03-17 DIAGNOSIS — F4322 Adjustment disorder with anxiety: Secondary | ICD-10-CM | POA: Diagnosis not present

## 2024-03-17 DIAGNOSIS — M6281 Muscle weakness (generalized): Secondary | ICD-10-CM | POA: Diagnosis not present

## 2024-03-17 DIAGNOSIS — F331 Major depressive disorder, recurrent, moderate: Secondary | ICD-10-CM | POA: Diagnosis not present

## 2024-03-17 DIAGNOSIS — I129 Hypertensive chronic kidney disease with stage 1 through stage 4 chronic kidney disease, or unspecified chronic kidney disease: Secondary | ICD-10-CM | POA: Diagnosis not present

## 2024-03-17 DIAGNOSIS — T148XXA Other injury of unspecified body region, initial encounter: Secondary | ICD-10-CM | POA: Diagnosis not present

## 2024-03-17 LAB — BASIC METABOLIC PANEL WITH GFR
Anion gap: 9 (ref 5–15)
BUN: 24 mg/dL — ABNORMAL HIGH (ref 8–23)
CO2: 26 mmol/L (ref 22–32)
Calcium: 8.7 mg/dL — ABNORMAL LOW (ref 8.9–10.3)
Chloride: 96 mmol/L — ABNORMAL LOW (ref 98–111)
Creatinine, Ser: 0.68 mg/dL (ref 0.44–1.00)
GFR, Estimated: >60 mL/min (ref >60)
Glucose, Bld: 104 mg/dL — ABNORMAL HIGH (ref 70–99)
Potassium: 4.6 mmol/L (ref 3.5–5.1)
Sodium: 131 mmol/L — ABNORMAL LOW (ref 135–145)

## 2024-03-17 LAB — CBC
HCT: 30.7 % — ABNORMAL LOW (ref 36.0–46.0)
Hemoglobin: 10 g/dL — ABNORMAL LOW (ref 12.0–15.0)
MCH: 31.7 pg (ref 26.0–34.0)
MCHC: 32.6 g/dL (ref 30.0–36.0)
MCV: 97.5 fL (ref 80.0–100.0)
Platelets: 232 10*3/uL (ref 150–400)
RBC: 3.15 MIL/uL — ABNORMAL LOW (ref 3.87–5.11)
RDW: 13.3 % (ref 11.5–15.5)
WBC: 8.7 10*3/uL (ref 4.0–10.5)
nRBC: 0 % (ref 0.0–0.2)

## 2024-03-17 LAB — MAGNESIUM: Magnesium: 1.8 mg/dL (ref 1.7–2.4)

## 2024-03-17 MED ORDER — SODIUM CHLORIDE 0.9 % IV BOLUS
500.0000 mL | Freq: Once | INTRAVENOUS | Status: AC
  Administered 2024-03-17: 500 mL via INTRAVENOUS

## 2024-03-17 NOTE — Discharge Summary (Signed)
 Physician Discharge Summary  Lasya Vetter WGN:[id number] DOB: 04-30-1933 DOA: 03/13/2024  PCP: Benedetta Bradley, MD  Admit date: 03/13/2024 Discharge date: 03/17/2024  Admitted From: Home Disposition: SNF  Recommendations for Outpatient Follow-up:  Follow up with PCP in 1-2 weeks Please obtain BMP/CBC in one week your next doctors visit.  Eliquis  on hold for a minimum of 2 weeks.  Repeat blood work and determine if this can be resumed.  I have encouraged against resuming Eliquis  in the future especially given high risk of fall.  Daughter is aware of this.  She can discuss this outpatient with her primary care provider about this. Encourage p.o. intake and oral hydration.   Discharge Condition: Stable CODE STATUS: DNR Diet recommendation: Regular  Brief/Interim Summary: Brief Narrative:  89 y.o. female with medical history significant for COPD, chronic pain, hypertension, hyperlipidemia, depression, anxiety, chronic hyponatremia, lung nodules concerning for adenocarcinoma, and history of PE on Eliquis  who presents with right hip pain after a fall 2 days ago.  Found to have intramuscular hematoma and hyponatremia.  Seen by orthopedic.  Currently Eliquis  is on hold.   Patient fell onto her right hip 2 days ago and has had increasing pain and swelling since then.  She had been experiencing lightheadedness and near syncope earlier, but denies that now  Today she is better. 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  Will continue to hold Eliquis  for now. -Weightbearing as tolerated    Hyponatremia  -Admission sodium 124, appears to be from dehydration, gentle hydration and trend sodium levels.  Acute blood loss anemia - Baseline hemoglobin 12.0, admission hemoglobin 9.5 trended down to 7.4.  Status post 1 unit PRBC transfusion 5/20.   Hx of PE ; Feb 2023 -Previously determined she should be on Eliquis  lifelong but given this fall and hematoma, will need to hold it until hemoglobin stabilizes.   Lung nodules  - Adenocarcinoma is suspected, followed by pulmonology in outpatient setting   -see CT scan below   History of hypertension  -Now elevated therefore will resume home losartan    CKD 3A  -From baseline hemoglobin 1.0    Depression, anxiety  - Pristiq, hydroxyzine      PT/OT-SNF, consult TOC   DVT prophylaxis: SCDs Start: 03/13/24 2333      Code Status: Limited: Do not attempt resuscitation (DNR) -DNR-LIMITED -Do Not Intubate/DNI  Family Communication: Daughter at bedside Status is: Inpatient Pending SNF placement   Subjective: Feels little dizzy but very poor PO intake No other complaints.   Examination:  General exam: Appears calm and comfortable  Respiratory system: Clear to auscultation. Respiratory effort normal. Cardiovascular system: S1 & S2 heard, RRR. No JVD, murmurs, rubs, gallops or clicks. No pedal edema. Gastrointestinal system: Abdomen is nondistended, soft and nontender. No organomegaly or masses felt. Normal bowel sounds heard. Central nervous system: Alert and oriented. No focal neurological deficits. Extremities: Symmetric 5 x 5 power. Skin: No rashes, lesions or ulcers Psychiatry: Judgement and insight  appear normal. Mood & affect appropriate.    Discharge Diagnoses:  Principal Problem:   Intramuscular hematoma Active Problems:   Essential hypertension   History of pulmonary embolism   Chronic pain disorder   CKD stage 3a, GFR  45-59 ml/min (HCC)   SIADH (syndrome of inappropriate ADH production) (HCC)   Adenocarcinoma of lung (HCC)   Anxiety   Hyponatremia      Discharge Exam: Vitals:   03/17/24 0447 03/17/24 0814  BP: (!) 140/49 (!) 144/48  Pulse: (!) 59 63  Resp: 20   Temp: 97.6 F (36.4 C) 97.6 F (36.4 C)  SpO2: 98% 98%   Vitals:   03/16/24 1330 03/16/24 2019 03/17/24 0447 03/17/24 0814  BP: (!) 126/44 117/63 (!) 140/49 (!) 144/48  Pulse: 60 70 (!) 59 63  Resp: 17 20 20    Temp: 97.9 F (36.6 C) 98.3 F (36.8 C) 97.6 F (36.4 C) 97.6 F (36.4 C)  TempSrc: Oral Oral Oral Oral  SpO2: 99% 98% 98% 98%  Weight:      Height:          Discharge Instructions   Allergies as of 03/17/2024       Reactions   Penicillins Hives, Rash   Tetracyclines & Related Anaphylaxis   Sulfa Antibiotics Hives, Other (See Comments)   unknown        Medication List     PAUSE taking these medications    Eliquis  5 MG Tabs tablet Wait to take this until your doctor or other care provider tells you to start again. Generic drug: apixaban  Take 5 mg by mouth 2 (two) times daily.       STOP taking these medications    levofloxacin  500 MG tablet Commonly known as: LEVAQUIN        TAKE these medications    cyanocobalamin  1000 MCG/ML injection Commonly known as: VITAMIN B12 Inject 100 mcg into the muscle every 30 (thirty) days.   desvenlafaxine 25 MG 24 hr tablet Commonly known as: PRISTIQ Take 25 mg by mouth every morning.   diclofenac  Sodium 1 % Gel Commonly known as: VOLTAREN  Apply 4 g topically 4 (four) times daily as needed (For pain).   docusate sodium  50 MG capsule Commonly known as: COLACE Take 2 capsules (100 mg total) by mouth daily as needed for mild constipation. What changed: how much to take   esomeprazole 40 MG capsule Commonly known as: NEXIUM Take 40 mg by mouth daily.   FeroSul 325 (65 Fe) MG tablet Generic drug: ferrous sulfate  Take 325 mg by mouth every other  day.   HYDROmorphone  2 MG tablet Commonly known as: DILAUDID  Take 1 tablet (2 mg total) by mouth in the morning and at bedtime. What changed:  how much to take additional instructions   hydrOXYzine  25 MG capsule Commonly known as: VISTARIL  Take 25 mg by mouth at bedtime.   lidocaine  2 % solution Commonly known as: XYLOCAINE  Use as directed 15 mLs in the mouth or throat 2 (two) times daily as needed for mouth pain.   losartan  100 MG tablet Commonly known as: COZAAR  Take 100 mg by mouth daily.   metoCLOPramide  5 MG tablet Commonly known as: REGLAN  Take 1 tablet (5 mg total) by mouth 3 (three) times daily before meals. What changed:  when to take this reasons to take this   ondansetron  8 MG tablet Commonly known as: ZOFRAN  Take 8 mg by mouth every 8 (eight) hours as needed for nausea or vomiting.  prochlorperazine 5 MG tablet Commonly known as: COMPAZINE Take 5 mg by mouth every 6 (six) hours as needed for vomiting or nausea.   sucralfate  1 g tablet Commonly known as: CARAFATE  Take 1 g by mouth 2 (two) times daily.   SYRINGE 3CC/25GX1-1/2" 25G X 1-1/2" 3 ML Misc 1 each by Does not apply route every 30 (thirty) days. For B12 injection        Contact information for follow-up providers     Benedetta Bradley, MD Follow up in 1 week(s).   Specialty: Internal Medicine Contact information: [address] [place] [address] [phone]              Contact information for after-discharge care     Destination     [facility] Preferred SNF .   Service: Skilled Nursing Contact information: [address] [place]  [phone]                    Allergies  Allergen Reactions   Penicillins Hives and Rash   Tetracyclines & Related Anaphylaxis   Sulfa Antibiotics Hives and Other (See Comments)    unknown    You were cared for by a hospitalist during your hospital  stay. If you have any questions about your discharge medications or the care you received while you were in the hospital after you are discharged, you can call the unit and asked to speak with the hospitalist on call if the hospitalist that took care of you is not available. Once you are discharged, your primary care physician will handle any further medical issues. Please note that no refills for any discharge medications will be authorized once you are discharged, as it is imperative that you return to your primary care physician (or establish a relationship with a primary care physician if you do not have one) for your aftercare needs so that they can reassess your need for medications and monitor your lab values.  You were cared for by a hospitalist during your hospital stay. If you have any questions about your discharge medications or the care you received while you were in the hospital after you are discharged, you can call the unit and asked to speak with the hospitalist on call if the hospitalist that took care of you is not available. Once you are discharged, your primary care physician will handle any further medical issues. Please note that NO REFILLS for any discharge medications will be authorized once you are discharged, as it is imperative that you return to your primary care physician (or establish a relationship with a primary care physician if you do not have one) for your aftercare needs so that they can reassess your need for medications and monitor your lab values.  Please request your Prim.MD to go over all Hospital Tests and Procedure/Radiological results at the follow up, please get all Hospital records sent to your Prim MD by signing hospital release before you go home.  Get CBC, CMP, 2 view Chest X ray checked  by Primary MD during your next visit or SNF MD in 5-7 days ( we routinely change or add medications that can affect your baseline labs and fluid status, therefore we recommend  that you get the mentioned basic workup next visit with your PCP, your PCP may decide not to get them or add new tests based on their clinical decision)  On your next visit with your primary care physician  please Get Medicines reviewed and adjusted.  If you experience worsening of your admission symptoms, develop shortness of breath, life threatening emergency, suicidal or homicidal thoughts you must seek medical attention immediately by calling 911 or calling your MD immediately  if symptoms less severe.  You Must read complete instructions/literature along with all the possible adverse reactions/side effects for all the Medicines you take and that have been prescribed to you. Take any new Medicines after you have completely understood and accpet all the possible adverse reactions/side effects.   Do not drive, operate heavy machinery, perform activities at heights, swimming or participation in water activities or provide baby sitting services if your were admitted for syncope or siezures until you have seen by Primary MD or a Neurologist and advised to do so again.  Do not drive when taking Pain medications.   Procedures/Studies: CT Chest Wo Contrast Result Date: 03/13/2024 CLINICAL DATA:  Pneumonia complication. EXAM: CT CHEST WITHOUT CONTRAST TECHNIQUE: Multidetector CT imaging of the chest was performed following the standard protocol without IV contrast. RADIATION DOSE REDUCTION: This exam was performed according to the departmental dose-optimization program which includes automated exposure control, adjustment of the mA and/or kV according to patient size and/or use of iterative reconstruction technique. COMPARISON:  Chest x-ray 03/06/2024. FINDINGS: Cardiovascular: No significant vascular findings. Normal heart size. No pericardial effusion. There are atherosclerotic calcifications of the aorta. Mediastinum/Nodes: No enlarged mediastinal or axillary lymph nodes. Thyroid  gland, trachea, and  esophagus demonstrate no significant findings. Lungs/Pleura: There are minimal clustered tree-in-bud opacities in the left lower lobe. There is focal ill-defined nodular density with surrounding ground-glass opacity in the right upper lobe measuring 10 x 7 by 7 mm with some adjacent pleural thickening/tethering. There is some scarring in the lingula. There is no pleural effusion or pneumothorax. There is a calcified granuloma in the left upper lobe. There is a noncalcified 3 mm nodule in the right upper lobe image 4/77. There is a noncalcified nodule in the right lower lobe measuring 5 mm image 4/104. Upper Abdomen: No acute abnormality. Cholecystectomy clips are present. There is a 2.3 cm cyst in the left kidney. Musculoskeletal: Degenerative changes affect the spine. IMPRESSION: 1. Minimal clustered tree-in-bud opacities in the left lower lobe compatible with infectious/inflammatory process. 2. Ill-defined nodular density with surrounding ground-glass opacity in the right upper lobe measuring up to 10 mm. This may be infectious/inflammatory, but neoplasm is not excluded. Consider one of the following in 3 months for both low-risk and high-risk individuals: (a) repeat chest CT, (b) follow-up PET-CT, or (c) tissue sampling. This recommendation follows the consensus statement: Guidelines for Management of Incidental Pulmonary Nodules Detected on CT Images: From the Fleischner Society 2017; Radiology 2017; 284:228-243. 3. Additional small right lung nodules measuring up to 5 mm. 4. Aortic atherosclerosis. Aortic Atherosclerosis (ICD10-I70.0). Electronically Signed   By: Tyron Gallon M.D.   On: 03/13/2024 21:28   CT PELVIS W CONTRAST Result Date: 03/13/2024 CLINICAL DATA:  Anemia low blood pressure right-sided hip pain EXAM: CT PELVIS WITH CONTRAST TECHNIQUE: Multidetector CT imaging of the pelvis was performed using the standard protocol following the bolus administration of intravenous contrast. RADIATION DOSE  REDUCTION: This exam was performed according to the departmental dose-optimization program which includes automated exposure control, adjustment of the mA and/or kV according to patient size and/or use of iterative reconstruction technique. CONTRAST:  75mL OMNIPAQUE  IOHEXOL  300 MG/ML  SOLN COMPARISON:  CT 10/28/2023, 11/23/2021 FINDINGS: Urinary Tract:  Partially obscured by right hip hardware artifact  Bowel:  No acute bowel wall thickening Vascular/Lymphatic: Aortic atherosclerosis. No aneurysm. No suspicious lymph nodes Reproductive: Hysterectomy. Curvilinear density at the level of vaginal cuff is a chronic finding and presumably related to postsurgical change. Other:  Negative for pelvic effusion or free air. Musculoskeletal: Right hip replacement with artifact. No acute fracture is seen. Edema within the subcutaneous soft tissues of the right posterior hip and gluteal region. Asymmetrical enlargement of the right gluteus muscles compared to the left, with oval slightly dense masslike area measuring 7.5 x 5.2 cm, consistent with intramuscular hematoma. Small focus of globular contrast, series 2, image 67. Some edema and asymmetrical enlargement of the right abductor muscles as well. IMPRESSION: 1. Right hip replacement with artifact. No acute osseous abnormality. 2. 7.5 cm suspected intramuscular hematoma within the right gluteus muscles with small focus of globular contrast, possible small focus of active bleeding. Edema within the subcutaneous soft tissues of the right posterolateral hip and gluteal region. 3. Aortic atherosclerosis. Aortic Atherosclerosis (ICD10-I70.0). Electronically Signed   By: Esmeralda Hedge M.D.   On: 03/13/2024 21:14   DG Hip Unilat  With Pelvis 2-3 Views Right Result Date: 03/13/2024 CLINICAL DATA:  Status post fall with right hip pain EXAM: DG HIP (WITH OR WITHOUT PELVIS) 3V RIGHT COMPARISON:  Radiograph of the right hips dated 12/09/2023 FINDINGS: Postsurgical changes of right hip  arthroplasty. Hardware appears intact and well seated. There is no evidence of hip fracture or dislocation. IMPRESSION: Postsurgical changes of right hip arthroplasty without evidence of acute fracture or dislocation. Electronically Signed   By: Limin  Xu M.D.   On: 03/13/2024 13:44   DG Chest Port 1 View Result Date: 03/06/2024 CLINICAL DATA:  Weakness. EXAM: PORTABLE CHEST 1 VIEW COMPARISON:  One-view chest x-ray 10/29/2023.  PET scan 11/26/2023. FINDINGS: Heart size is normal. Atherosclerotic calcifications are present at the aortic arch. Increasing right perihilar density is noted. Right upper lobe opacity is increasing. The left lung is clear. IMPRESSION: 1. Increasing right perihilar density and right upper lobe opacity. This likely represents a combination of atelectasis and pneumonia. 2. Aortic atherosclerosis. Electronically Signed   By: Audree Leas M.D.   On: 03/06/2024 13:14     The results of significant diagnostics from this hospitalization (including imaging, microbiology, ancillary and laboratory) are listed below for reference.     Microbiology: No results found for this or any previous visit (from the past 240 hours).   Labs: BNP (last 3 results) No results for input(s): "BNP" in the last 8760 hours. Basic Metabolic Panel: Recent Labs  Lab 03/15/24 1654 03/16/24 0012 03/16/24 0844 03/16/24 1837 03/17/24 0457 03/17/24 0844  NA 130* 129* 132* 131*  --  131*  K 4.1 4.4 4.3 4.7  --  4.6  CL 99 98 99 97*  --  96*  CO2 26 24 25 26   --  26  GLUCOSE 128* 119* 109* 126*  --  104*  BUN 21 22 19 22   --  24*  CREATININE 0.95 0.62 0.87 0.87  --  0.68  CALCIUM  7.9* 7.9* 8.3* 8.2*  --  8.7*  MG  --   --  1.7  --  1.8  --    Liver Function Tests: Recent Labs  Lab 03/13/24 1253  AST 18  ALT 13  ALKPHOS 69  BILITOT 1.1  PROT 6.0*  ALBUMIN  3.5   No results for input(s): "LIPASE", "AMYLASE" in the last 168 hours. No results for input(s): "AMMONIA" in the last 168  hours. CBC: Recent Labs  Lab 03/15/24 0047 03/15/24 1200 03/15/24 2251 03/16/24 1837 03/17/24 0844  WBC 9.1 10.7* 8.6 8.9 8.7  HGB 8.8* 11.2* 8.2* 9.3* 10.0*  HCT 26.1* 34.1* 24.8* 27.9* 30.7*  MCV 91.3 96.1 93.2 93.9 97.5  PLT 180 222 183 212 232   Cardiac Enzymes: No results for input(s): "CKTOTAL", "CKMB", "CKMBINDEX", "TROPONINI" in the last 168 hours. BNP: Invalid input(s): "POCBNP" CBG: Recent Labs  Lab 03/13/24 1141  GLUCAP 161*   D-Dimer No results for input(s): "DDIMER" in the last 72 hours. Hgb A1c No results for input(s): "HGBA1C" in the last 72 hours. Lipid Profile No results for input(s): "CHOL", "HDL", "LDLCALC", "TRIG", "CHOLHDL", "LDLDIRECT" in the last 72 hours. Thyroid  function studies No results for input(s): "TSH", "T4TOTAL", "T3FREE", "THYROIDAB" in the last 72 hours.  Invalid input(s): "FREET3" Anemia work up No results for input(s): "VITAMINB12", "FOLATE", "FERRITIN", "TIBC", "IRON", "RETICCTPCT" in the last 72 hours. Urinalysis    Component Value Date/Time   COLORURINE YELLOW 03/13/2024 1357   APPEARANCEUR CLEAR 03/13/2024 1357   LABSPEC 1.016 03/13/2024 1357   PHURINE 6.0 03/13/2024 1357   GLUCOSEU NEGATIVE 03/13/2024 1357   HGBUR NEGATIVE 03/13/2024 1357   BILIRUBINUR NEGATIVE 03/13/2024 1357   KETONESUR NEGATIVE 03/13/2024 1357   PROTEINUR NEGATIVE 03/13/2024 1357   NITRITE NEGATIVE 03/13/2024 1357   LEUKOCYTESUR NEGATIVE 03/13/2024 1357   Sepsis Labs Recent Labs  Lab 03/15/24 1200 03/15/24 2251 03/16/24 1837 03/17/24 0844  WBC 10.7* 8.6 8.9 8.7   Microbiology No results found for this or any previous visit (from the past 240 hours).   Time coordinating discharge:  I have spent 35 minutes face to face with the patient and on the ward discussing the patients care, assessment, plan and disposition with other care givers. >50% of the time was devoted counseling the patient about the risks and benefits of treatment/Discharge  disposition and coordinating care.   SIGNED:   [name], MD  [facility] 03/17/2024, 10:06 AM   If 7PM-7AM, please contact night-coverage

## 2024-03-17 NOTE — Plan of Care (Signed)

## 2024-03-17 NOTE — Progress Notes (Signed)
 Mobility Specialist - Progress Note   03/17/24 0842  Mobility  Activity Ambulated with assistance to bathroom  Level of Assistance Minimal assist, patient does 75% or more  Assistive Device Front wheel walker  Distance Ambulated (ft) 20 ft  Range of Motion/Exercises Active Assistive  RLE Weight Bearing Per Provider Order WBAT  Mobility visit 1 Mobility  Mobility Specialist Start Time (ACUTE ONLY) 0830  Mobility Specialist Stop Time (ACUTE ONLY) [id number]  Mobility Specialist Time Calculation (min) (ACUTE ONLY) 12 min   Pt requesting assistance to bathroom. C/o R hip pain. Returned to bed with all needs met. Call bell in reach.  Lorna Rose,  Mobility Specialist Can be reached via Secure Chat

## 2024-03-17 NOTE — TOC Transition Note (Addendum)
 Transition of Care [facility]) - Discharge Note   Patient Details  Name: Kristen [name] MRN: [id number] Date of Birth: [birth year]  Transition of Care [facility]) CM/SW Contact:  Ruben Corolla, RN Phone Number: 03/17/2024, 9:40 AM   Clinical Narrative: Howard Macho for [phone], [phone] rep Daril Edge aware of d/c today. Await d/c summary,rm,report # prior PTAR.  -11a   Going to [address] rm#[id number], report #[phone]. Ensure DNR signed. PTAR called.Forms in printer. No further CM needs.    Final next level of care: Skilled Nursing Facility Barriers to Discharge: No Barriers Identified   Patient Goals and CMS Choice Patient states their goals for this hospitalization and ongoing recovery are:: Rehab CMS Medicare.gov Compare Post Acute Care list provided to:: Patient Represenative (must comment) (Susan(dtr)) Choice offered to / list presented to : Adult Children [facility] ownership interest in [facility].provided to:: Adult Children    Discharge Placement PASRR number recieved: 03/14/24            Patient chooses bed at: [facility] Patient to be transferred to facility by: PTAR Name of family member notified: Susanz(dtr) Patient and family notified of of transfer: 03/17/24  Discharge Plan and Services Additional resources added to the After Visit Summary for     Discharge Planning Services: CM Consult Post Acute Care Choice: Resumption of Svcs/PTA Provider                               Social Drivers of Health (SDOH) Interventions SDOH Screenings   Food Insecurity: No Food Insecurity (03/14/2024)  Housing: Low Risk  (03/14/2024)  Transportation Needs: No Transportation Needs (03/14/2024)  Utilities: Not At Risk (03/14/2024)  Financial Resource Strain: Low Risk  (06/19/2019)   Received from [facility], [facility]  Social Connections: Socially Isolated (03/14/2024)  Stress: No Stress Concern Present (06/19/2019)   Received from [facility],  [facility]  Tobacco Use: Low Risk  (03/13/2024)  Health Literacy: Medium Risk (02/01/2021)   Received from [facility], [facility]     Readmission Risk Interventions    03/14/2024    3:54 PM 12/28/2023    3:43 PM  Readmission Risk Prevention Plan  Transportation Screening Complete Complete  PCP or Specialist Appt within 3-5 Days  Complete  HRI or Home Care Consult  Complete  Palliative Care Screening  Not Applicable  Medication Review (RN Care Manager) Complete Complete  PCP or Specialist appointment within 3-5 days of discharge Complete   HRI or Home Care Consult Complete   SW Recovery Care/Counseling Consult Complete   Palliative Care Screening Not Applicable   Skilled Nursing Facility Complete

## 2024-03-20 DIAGNOSIS — F331 Major depressive disorder, recurrent, moderate: Secondary | ICD-10-CM | POA: Diagnosis not present

## 2024-03-20 DIAGNOSIS — F4322 Adjustment disorder with anxiety: Secondary | ICD-10-CM | POA: Diagnosis not present

## 2024-03-21 DIAGNOSIS — S72009D Fracture of unspecified part of neck of unspecified femur, subsequent encounter for closed fracture with routine healing: Secondary | ICD-10-CM | POA: Diagnosis not present

## 2024-03-21 DIAGNOSIS — R262 Difficulty in walking, not elsewhere classified: Secondary | ICD-10-CM | POA: Diagnosis not present

## 2024-03-21 DIAGNOSIS — G479 Sleep disorder, unspecified: Secondary | ICD-10-CM | POA: Diagnosis not present

## 2024-03-23 DIAGNOSIS — Z86711 Personal history of pulmonary embolism: Secondary | ICD-10-CM | POA: Diagnosis not present

## 2024-03-23 DIAGNOSIS — G894 Chronic pain syndrome: Secondary | ICD-10-CM | POA: Diagnosis not present

## 2024-03-23 DIAGNOSIS — N1831 Chronic kidney disease, stage 3a: Secondary | ICD-10-CM | POA: Diagnosis not present

## 2024-03-23 DIAGNOSIS — M7981 Nontraumatic hematoma of soft tissue: Secondary | ICD-10-CM | POA: Diagnosis not present

## 2024-03-23 DIAGNOSIS — E222 Syndrome of inappropriate secretion of antidiuretic hormone: Secondary | ICD-10-CM | POA: Diagnosis not present

## 2024-03-23 DIAGNOSIS — I129 Hypertensive chronic kidney disease with stage 1 through stage 4 chronic kidney disease, or unspecified chronic kidney disease: Secondary | ICD-10-CM | POA: Diagnosis not present

## 2024-03-23 DIAGNOSIS — D62 Acute posthemorrhagic anemia: Secondary | ICD-10-CM | POA: Diagnosis not present

## 2024-03-23 DIAGNOSIS — J449 Chronic obstructive pulmonary disease, unspecified: Secondary | ICD-10-CM | POA: Diagnosis not present

## 2024-03-23 DIAGNOSIS — E870 Hyperosmolality and hypernatremia: Secondary | ICD-10-CM | POA: Diagnosis not present

## 2024-03-25 DIAGNOSIS — Z96641 Presence of right artificial hip joint: Secondary | ICD-10-CM | POA: Diagnosis not present

## 2024-03-25 DIAGNOSIS — F32A Depression, unspecified: Secondary | ICD-10-CM | POA: Diagnosis not present

## 2024-03-25 DIAGNOSIS — S72001D Fracture of unspecified part of neck of right femur, subsequent encounter for closed fracture with routine healing: Secondary | ICD-10-CM | POA: Diagnosis not present

## 2024-03-25 DIAGNOSIS — E785 Hyperlipidemia, unspecified: Secondary | ICD-10-CM | POA: Diagnosis not present

## 2024-03-25 DIAGNOSIS — F419 Anxiety disorder, unspecified: Secondary | ICD-10-CM | POA: Diagnosis not present

## 2024-03-25 DIAGNOSIS — J449 Chronic obstructive pulmonary disease, unspecified: Secondary | ICD-10-CM | POA: Diagnosis not present

## 2024-03-25 DIAGNOSIS — I129 Hypertensive chronic kidney disease with stage 1 through stage 4 chronic kidney disease, or unspecified chronic kidney disease: Secondary | ICD-10-CM | POA: Diagnosis not present

## 2024-03-25 DIAGNOSIS — D631 Anemia in chronic kidney disease: Secondary | ICD-10-CM | POA: Diagnosis not present

## 2024-03-25 DIAGNOSIS — N1832 Chronic kidney disease, stage 3b: Secondary | ICD-10-CM | POA: Diagnosis not present

## 2024-03-28 DIAGNOSIS — Z96641 Presence of right artificial hip joint: Secondary | ICD-10-CM | POA: Diagnosis not present

## 2024-03-28 DIAGNOSIS — D631 Anemia in chronic kidney disease: Secondary | ICD-10-CM | POA: Diagnosis not present

## 2024-03-28 DIAGNOSIS — F32A Depression, unspecified: Secondary | ICD-10-CM | POA: Diagnosis not present

## 2024-03-28 DIAGNOSIS — I129 Hypertensive chronic kidney disease with stage 1 through stage 4 chronic kidney disease, or unspecified chronic kidney disease: Secondary | ICD-10-CM | POA: Diagnosis not present

## 2024-03-28 DIAGNOSIS — F419 Anxiety disorder, unspecified: Secondary | ICD-10-CM | POA: Diagnosis not present

## 2024-03-28 DIAGNOSIS — S72001D Fracture of unspecified part of neck of right femur, subsequent encounter for closed fracture with routine healing: Secondary | ICD-10-CM | POA: Diagnosis not present

## 2024-03-28 DIAGNOSIS — N1832 Chronic kidney disease, stage 3b: Secondary | ICD-10-CM | POA: Diagnosis not present

## 2024-03-28 DIAGNOSIS — J449 Chronic obstructive pulmonary disease, unspecified: Secondary | ICD-10-CM | POA: Diagnosis not present

## 2024-03-28 DIAGNOSIS — E785 Hyperlipidemia, unspecified: Secondary | ICD-10-CM | POA: Diagnosis not present

## 2024-04-06 DIAGNOSIS — F32A Depression, unspecified: Secondary | ICD-10-CM | POA: Diagnosis not present

## 2024-04-06 DIAGNOSIS — D631 Anemia in chronic kidney disease: Secondary | ICD-10-CM | POA: Diagnosis not present

## 2024-04-06 DIAGNOSIS — I129 Hypertensive chronic kidney disease with stage 1 through stage 4 chronic kidney disease, or unspecified chronic kidney disease: Secondary | ICD-10-CM | POA: Diagnosis not present

## 2024-04-06 DIAGNOSIS — S72001D Fracture of unspecified part of neck of right femur, subsequent encounter for closed fracture with routine healing: Secondary | ICD-10-CM | POA: Diagnosis not present

## 2024-04-06 DIAGNOSIS — J449 Chronic obstructive pulmonary disease, unspecified: Secondary | ICD-10-CM | POA: Diagnosis not present

## 2024-04-06 DIAGNOSIS — F419 Anxiety disorder, unspecified: Secondary | ICD-10-CM | POA: Diagnosis not present

## 2024-04-06 DIAGNOSIS — E785 Hyperlipidemia, unspecified: Secondary | ICD-10-CM | POA: Diagnosis not present

## 2024-04-06 DIAGNOSIS — Z96641 Presence of right artificial hip joint: Secondary | ICD-10-CM | POA: Diagnosis not present

## 2024-04-06 DIAGNOSIS — N1832 Chronic kidney disease, stage 3b: Secondary | ICD-10-CM | POA: Diagnosis not present

## 2024-04-07 DIAGNOSIS — D631 Anemia in chronic kidney disease: Secondary | ICD-10-CM | POA: Diagnosis not present

## 2024-04-07 DIAGNOSIS — N1832 Chronic kidney disease, stage 3b: Secondary | ICD-10-CM | POA: Diagnosis not present

## 2024-04-07 DIAGNOSIS — F32A Depression, unspecified: Secondary | ICD-10-CM | POA: Diagnosis not present

## 2024-04-07 DIAGNOSIS — J449 Chronic obstructive pulmonary disease, unspecified: Secondary | ICD-10-CM | POA: Diagnosis not present

## 2024-04-07 DIAGNOSIS — E785 Hyperlipidemia, unspecified: Secondary | ICD-10-CM | POA: Diagnosis not present

## 2024-04-07 DIAGNOSIS — F419 Anxiety disorder, unspecified: Secondary | ICD-10-CM | POA: Diagnosis not present

## 2024-04-07 DIAGNOSIS — I129 Hypertensive chronic kidney disease with stage 1 through stage 4 chronic kidney disease, or unspecified chronic kidney disease: Secondary | ICD-10-CM | POA: Diagnosis not present

## 2024-04-07 DIAGNOSIS — Z96641 Presence of right artificial hip joint: Secondary | ICD-10-CM | POA: Diagnosis not present

## 2024-04-07 DIAGNOSIS — S72001D Fracture of unspecified part of neck of right femur, subsequent encounter for closed fracture with routine healing: Secondary | ICD-10-CM | POA: Diagnosis not present

## 2024-04-10 ENCOUNTER — Other Ambulatory Visit: Payer: Self-pay | Admitting: Licensed Clinical Social Worker

## 2024-04-10 NOTE — Patient Outreach (Signed)
 Complex Care Management   Visit Note  04/10/2024  Name:  Kristen [name] MRN: [id number] DOB: [birth year]  Situation: Referral received for Complex Care Management related to Mental/Behavioral Health diagnosis Anxiety I obtained verbal consent from Patient.  Visit completed with pt's daughter, Kristen [name]  on the phone  Background:   Past Medical History:  Diagnosis Date   Anemia    Arthritis    B12 deficiency    Chronic kidney disease    Chronic pain disorder    trigeminal neuralgia   Depression    Dyslipidemia    Hypertension    PONV (postoperative nausea and vomiting)    Seizure disorder (HCC)    decades since last seizure   Tremor    Trigeminal nerve disorder     Assessment: Patient Reported Symptoms:  Cognitive Cognitive Status: Alert and oriented to person, place, and time, Normal speech and language skills      Neurological Neurological Review of Symptoms: No symptoms reported    HEENT HEENT Symptoms Reported: No symptoms reported HEENT Management Strategies: Routine screening HEENT Comment: Family will schedule appt with PCP office to assist with ear irragation    Cardiovascular Cardiovascular Symptoms Reported: No symptoms reported    Respiratory Respiratory Symptoms Reported: No symptoms reported    Endocrine Patient reports the following symptoms related to hypoglycemia or hyperglycemia : No symptoms reported    Gastrointestinal Gastrointestinal Symptoms Reported: No symptoms reported      Genitourinary Genitourinary Symptoms Reported: No symptoms reported    Integumentary Integumentary Symptoms Reported: No symptoms reported    Musculoskeletal Musculoskelatal Symptoms Reviewed: No symptoms reported        Psychosocial Psychosocial Symptoms Reported: Anxiety - if selected complete GAD Additional Psychological Details: [name] reports PCP has adjusted medications to assist with management of anxiety/depression symptoms. Strategies discussed to assist,  such as, improving socialization and exercising. Behavioral Health Conditions: Anxiety Behavioral Management Strategies: Adequate rest, Support system, Exercise, Medication therapy Major Change/Loss/Stressor/Fears (CP): Medical condition, self Techniques to Cope with Loss/Stress/Change: Diversional activities Quality of Family Relationships: involved, supportive, helpful       No data to display          There were no vitals filed for this visit.  Medications Reviewed Today     Reviewed by Aubrii Sharpless D, LCSW (Social Worker) on 04/10/24 at 1147  Med List Status: <None>   Medication Order Taking? Sig Documenting Provider Last Dose Status Informant  cyanocobalamin  (,VITAMIN B-12,) 1000 MCG/ML injection [id number] Yes Inject 100 mcg into the muscle every 30 (thirty) days. [provider]  Active Other, Pharmacy Records, Multiple Informants, Care Giver, Self           Med Note Basil Lim, DUROJAHYE' R   Mon Mar 13, 2024 10:32 PM)    desvenlafaxine (PRISTIQ) 25 MG 24 hr tablet [id number] Yes Take 25 mg by mouth every morning. [provider]  Active Other, Pharmacy Records, Multiple Informants, Care Giver, Self  diclofenac  Sodium (VOLTAREN ) 1 % GEL [id number] Yes Apply 4 g topically 4 (four) times daily as needed (For pain). [provider]  Active Other, Pharmacy Records, Multiple Informants, Care Giver, Self  docusate sodium  (COLACE) 50 MG capsule [id number] Yes Take 2 capsules (100 mg total) by mouth daily as needed for mild constipation. [name], MD  Active   ELIQUIS  5 MG TABS tablet [id number] Yes Take 5 mg by mouth 2 (two) times daily. [provider]  Active Other, Pharmacy Records, Multiple Informants, Care Giver, Self  esomeprazole (NEXIUM) 40 MG capsule [id number] Yes Take 40 mg by mouth daily. [provider]  Active Other, Pharmacy Records, Multiple Informants, Care Giver, Self  FEROSUL 325 (65 Fe) MG tablet [id number] Yes Take 325 mg  by mouth every other day. [provider]  Active Other, Pharmacy Records, Multiple Informants, Care Giver, Self  HYDROmorphone  (DILAUDID ) 2 MG tablet [id number] Yes Take 1 tablet (2 mg total) by mouth in the morning and at bedtime. Amin, Ankit C, MD  Active   hydrOXYzine  (VISTARIL ) 25 MG capsule [id number] Yes Take 25 mg by mouth at bedtime. [provider]  Active Other, Pharmacy Records, Multiple Informants, Care Giver, Self           Med Note (SATTERFIELD, Ken Patty   Fri Dec 24, 2023  3:41 PM) Daughter verified patient is taking this medication   lidocaine  (XYLOCAINE ) 2 % solution [id number] Yes Use as directed 15 mLs in the mouth or throat 2 (two) times daily as needed for mouth pain. [provider]  Active Other, Pharmacy Records, Multiple Informants, Care Giver, Self  losartan  (COZAAR ) 100 MG tablet [id number] Yes Take 100 mg by mouth daily. [provider]  Active Other, Pharmacy Records, Multiple Informants, Care Giver, Self  metoCLOPramide  (REGLAN ) 5 MG tablet [id number] Yes Take 1 tablet (5 mg total) by mouth 3 (three) times daily before meals.  Patient taking differently: Take 5 mg by mouth every 8 (eight) hours as needed for vomiting, refractory nausea / vomiting or nausea.   Shalhoub, Merrill Abide, MD  Active Other, Pharmacy Records, Multiple Informants, Care Giver, Self  ondansetron  (ZOFRAN ) 8 MG tablet [id number] Yes Take 8 mg by mouth every 8 (eight) hours as needed for nausea or vomiting. [provider]  Active Other, Pharmacy Records, Multiple Informants, Care Giver, Self  prochlorperazine (COMPAZINE) 5 MG tablet [id number] Yes Take 5 mg by mouth every 6 (six) hours as needed for vomiting or nausea. [provider]  Active Other, Pharmacy Records, Multiple Informants, Care Giver, Self  sucralfate  (CARAFATE ) 1 g tablet [id number] Yes Take 1 g by mouth 2 (two) times daily. [provider]  Active Other, Pharmacy Records, Multiple  Informants, Care Giver, Self  Syringe/Needle, Disp, (SYRINGE 3CC/25GX1-1/2) 25G X 1-1/2 3 ML MISC [id number] Yes 1 each by Does not apply route every 30 (thirty) days. For B12 injection [provider]  Active Other, Pharmacy Records, Multiple Informants, Care Giver, Self  Med List Note Alline Ivans, CPhT 03/14/24 1304): [facility]             Recommendation:   PCP Follow-up Continue Current Plan of Care  Follow Up Plan:   Telephone follow-up in 1 month  Alease Hunter, LCSW [facility]  [facility], [facility] Clinical Social Worker Direct Dial: [phone]  Fax: [phone] Website: [email or web address] 12:09 PM

## 2024-04-10 NOTE — Patient Instructions (Signed)
 Visit Information  Thank you for taking time to visit with me today. Please don't hesitate to contact me if I can be of assistance to you before our next scheduled appointment.  Your next care management appointment is by telephone on 07/18 at 11 AM  Please call the care guide team at [phone] if you need to cancel, schedule, or reschedule an appointment.   Please call the Suicide and Crisis Lifeline: 988 call 911 if you are experiencing a Mental Health or Behavioral Health Crisis or need someone to talk to.  Alease Hunter, LCSW [place]  [facility], [facility] Clinical Social Worker Direct Dial: [phone]  Fax: [phone] Website: [email or web address] 12:10 PM

## 2024-04-12 DIAGNOSIS — E785 Hyperlipidemia, unspecified: Secondary | ICD-10-CM | POA: Diagnosis not present

## 2024-04-12 DIAGNOSIS — Z96641 Presence of right artificial hip joint: Secondary | ICD-10-CM | POA: Diagnosis not present

## 2024-04-12 DIAGNOSIS — D631 Anemia in chronic kidney disease: Secondary | ICD-10-CM | POA: Diagnosis not present

## 2024-04-12 DIAGNOSIS — F419 Anxiety disorder, unspecified: Secondary | ICD-10-CM | POA: Diagnosis not present

## 2024-04-12 DIAGNOSIS — J449 Chronic obstructive pulmonary disease, unspecified: Secondary | ICD-10-CM | POA: Diagnosis not present

## 2024-04-12 DIAGNOSIS — I129 Hypertensive chronic kidney disease with stage 1 through stage 4 chronic kidney disease, or unspecified chronic kidney disease: Secondary | ICD-10-CM | POA: Diagnosis not present

## 2024-04-12 DIAGNOSIS — S72001D Fracture of unspecified part of neck of right femur, subsequent encounter for closed fracture with routine healing: Secondary | ICD-10-CM | POA: Diagnosis not present

## 2024-04-12 DIAGNOSIS — N1832 Chronic kidney disease, stage 3b: Secondary | ICD-10-CM | POA: Diagnosis not present

## 2024-04-12 DIAGNOSIS — F32A Depression, unspecified: Secondary | ICD-10-CM | POA: Diagnosis not present

## 2024-04-17 DIAGNOSIS — R42 Dizziness and giddiness: Secondary | ICD-10-CM | POA: Diagnosis not present

## 2024-04-17 DIAGNOSIS — R110 Nausea: Secondary | ICD-10-CM | POA: Diagnosis not present

## 2024-04-17 DIAGNOSIS — H6123 Impacted cerumen, bilateral: Secondary | ICD-10-CM | POA: Diagnosis not present

## 2024-04-20 DIAGNOSIS — N1832 Chronic kidney disease, stage 3b: Secondary | ICD-10-CM | POA: Diagnosis not present

## 2024-04-20 DIAGNOSIS — E785 Hyperlipidemia, unspecified: Secondary | ICD-10-CM | POA: Diagnosis not present

## 2024-04-20 DIAGNOSIS — Z96641 Presence of right artificial hip joint: Secondary | ICD-10-CM | POA: Diagnosis not present

## 2024-04-20 DIAGNOSIS — I129 Hypertensive chronic kidney disease with stage 1 through stage 4 chronic kidney disease, or unspecified chronic kidney disease: Secondary | ICD-10-CM | POA: Diagnosis not present

## 2024-04-20 DIAGNOSIS — S72001D Fracture of unspecified part of neck of right femur, subsequent encounter for closed fracture with routine healing: Secondary | ICD-10-CM | POA: Diagnosis not present

## 2024-04-20 DIAGNOSIS — J449 Chronic obstructive pulmonary disease, unspecified: Secondary | ICD-10-CM | POA: Diagnosis not present

## 2024-04-20 DIAGNOSIS — F32A Depression, unspecified: Secondary | ICD-10-CM | POA: Diagnosis not present

## 2024-04-20 DIAGNOSIS — D631 Anemia in chronic kidney disease: Secondary | ICD-10-CM | POA: Diagnosis not present

## 2024-04-20 DIAGNOSIS — F419 Anxiety disorder, unspecified: Secondary | ICD-10-CM | POA: Diagnosis not present

## 2024-04-26 ENCOUNTER — Institutional Professional Consult (permissible substitution) (INDEPENDENT_AMBULATORY_CARE_PROVIDER_SITE_OTHER): Admitting: Physician Assistant

## 2024-04-27 DIAGNOSIS — S0502XA Injury of conjunctiva and corneal abrasion without foreign body, left eye, initial encounter: Secondary | ICD-10-CM | POA: Diagnosis not present

## 2024-05-01 DIAGNOSIS — F32A Depression, unspecified: Secondary | ICD-10-CM | POA: Diagnosis not present

## 2024-05-01 DIAGNOSIS — S72001D Fracture of unspecified part of neck of right femur, subsequent encounter for closed fracture with routine healing: Secondary | ICD-10-CM | POA: Diagnosis not present

## 2024-05-01 DIAGNOSIS — H16102 Unspecified superficial keratitis, left eye: Secondary | ICD-10-CM | POA: Diagnosis not present

## 2024-05-01 DIAGNOSIS — E785 Hyperlipidemia, unspecified: Secondary | ICD-10-CM | POA: Diagnosis not present

## 2024-05-01 DIAGNOSIS — I129 Hypertensive chronic kidney disease with stage 1 through stage 4 chronic kidney disease, or unspecified chronic kidney disease: Secondary | ICD-10-CM | POA: Diagnosis not present

## 2024-05-01 DIAGNOSIS — D631 Anemia in chronic kidney disease: Secondary | ICD-10-CM | POA: Diagnosis not present

## 2024-05-01 DIAGNOSIS — J449 Chronic obstructive pulmonary disease, unspecified: Secondary | ICD-10-CM | POA: Diagnosis not present

## 2024-05-01 DIAGNOSIS — F419 Anxiety disorder, unspecified: Secondary | ICD-10-CM | POA: Diagnosis not present

## 2024-05-01 DIAGNOSIS — N1832 Chronic kidney disease, stage 3b: Secondary | ICD-10-CM | POA: Diagnosis not present

## 2024-05-01 DIAGNOSIS — Z96641 Presence of right artificial hip joint: Secondary | ICD-10-CM | POA: Diagnosis not present

## 2024-05-12 ENCOUNTER — Other Ambulatory Visit: Payer: Self-pay | Admitting: Licensed Clinical Social Worker

## 2024-05-13 ENCOUNTER — Encounter (HOSPITAL_COMMUNITY): Payer: Self-pay

## 2024-05-13 ENCOUNTER — Other Ambulatory Visit: Payer: Self-pay

## 2024-05-13 ENCOUNTER — Emergency Department (HOSPITAL_COMMUNITY)

## 2024-05-13 ENCOUNTER — Emergency Department (HOSPITAL_COMMUNITY)
Admission: EM | Admit: 2024-05-13 | Discharge: 2024-05-13 | Disposition: A | Discharge status: Home / Self Care | Attending: Emergency Medicine | Admitting: Emergency Medicine

## 2024-05-13 DIAGNOSIS — S3993XA Unspecified injury of pelvis, initial encounter: Secondary | ICD-10-CM | POA: Diagnosis not present

## 2024-05-13 DIAGNOSIS — Z79899 Other long term (current) drug therapy: Secondary | ICD-10-CM | POA: Insufficient documentation

## 2024-05-13 DIAGNOSIS — G9389 Other specified disorders of brain: Secondary | ICD-10-CM | POA: Diagnosis not present

## 2024-05-13 DIAGNOSIS — R42 Dizziness and giddiness: Secondary | ICD-10-CM | POA: Diagnosis not present

## 2024-05-13 DIAGNOSIS — M85821 Other specified disorders of bone density and structure, right upper arm: Secondary | ICD-10-CM | POA: Diagnosis not present

## 2024-05-13 DIAGNOSIS — Z471 Aftercare following joint replacement surgery: Secondary | ICD-10-CM | POA: Diagnosis not present

## 2024-05-13 DIAGNOSIS — N189 Chronic kidney disease, unspecified: Secondary | ICD-10-CM | POA: Insufficient documentation

## 2024-05-13 DIAGNOSIS — Z23 Encounter for immunization: Secondary | ICD-10-CM | POA: Diagnosis not present

## 2024-05-13 DIAGNOSIS — W01198A Fall on same level from slipping, tripping and stumbling with subsequent striking against other object, initial encounter: Secondary | ICD-10-CM | POA: Insufficient documentation

## 2024-05-13 DIAGNOSIS — M79661 Pain in right lower leg: Secondary | ICD-10-CM | POA: Diagnosis not present

## 2024-05-13 DIAGNOSIS — M79621 Pain in right upper arm: Secondary | ICD-10-CM | POA: Diagnosis not present

## 2024-05-13 DIAGNOSIS — I6782 Cerebral ischemia: Secondary | ICD-10-CM | POA: Diagnosis not present

## 2024-05-13 DIAGNOSIS — M85832 Other specified disorders of bone density and structure, left forearm: Secondary | ICD-10-CM | POA: Diagnosis not present

## 2024-05-13 DIAGNOSIS — M79632 Pain in left forearm: Secondary | ICD-10-CM | POA: Diagnosis not present

## 2024-05-13 DIAGNOSIS — W19XXXA Unspecified fall, initial encounter: Secondary | ICD-10-CM | POA: Diagnosis not present

## 2024-05-13 DIAGNOSIS — Z7901 Long term (current) use of anticoagulants: Secondary | ICD-10-CM | POA: Insufficient documentation

## 2024-05-13 DIAGNOSIS — Z96641 Presence of right artificial hip joint: Secondary | ICD-10-CM | POA: Diagnosis not present

## 2024-05-13 DIAGNOSIS — M5020 Other cervical disc displacement, unspecified cervical region: Secondary | ICD-10-CM | POA: Diagnosis not present

## 2024-05-13 DIAGNOSIS — S40021A Contusion of right upper arm, initial encounter: Secondary | ICD-10-CM | POA: Insufficient documentation

## 2024-05-13 DIAGNOSIS — S51812A Laceration without foreign body of left forearm, initial encounter: Secondary | ICD-10-CM | POA: Insufficient documentation

## 2024-05-13 DIAGNOSIS — R519 Headache, unspecified: Secondary | ICD-10-CM | POA: Diagnosis not present

## 2024-05-13 DIAGNOSIS — M85861 Other specified disorders of bone density and structure, right lower leg: Secondary | ICD-10-CM | POA: Diagnosis not present

## 2024-05-13 DIAGNOSIS — R079 Chest pain, unspecified: Secondary | ICD-10-CM | POA: Diagnosis not present

## 2024-05-13 LAB — URINALYSIS, ROUTINE W REFLEX MICROSCOPIC
Bilirubin Urine: NEGATIVE
Glucose, UA: NEGATIVE mg/dL
Hgb urine dipstick: NEGATIVE
Ketones, ur: NEGATIVE mg/dL
Leukocytes,Ua: NEGATIVE
Nitrite: NEGATIVE
Protein, ur: NEGATIVE mg/dL
Specific Gravity, Urine: 1.008 (ref 1.005–1.030)
pH: 6 (ref 5.0–8.0)

## 2024-05-13 LAB — I-STAT CHEM 8, ED
BUN: 10 mg/dL (ref 8–23)
Calcium, Ion: 1.14 mmol/L — ABNORMAL LOW (ref 1.15–1.40)
Chloride: 92 mmol/L — ABNORMAL LOW (ref 98–111)
Creatinine, Ser: 1 mg/dL (ref 0.44–1.00)
Glucose, Bld: 119 mg/dL — ABNORMAL HIGH (ref 70–99)
HCT: 37 % (ref 36.0–46.0)
Hemoglobin: 12.6 g/dL (ref 12.0–15.0)
Potassium: 4.3 mmol/L (ref 3.5–5.1)
Sodium: 127 mmol/L — ABNORMAL LOW (ref 135–145)
TCO2: 24 mmol/L (ref 22–32)

## 2024-05-13 LAB — CBC
HCT: 35.8 % — ABNORMAL LOW (ref 36.0–46.0)
Hemoglobin: 12.2 g/dL (ref 12.0–15.0)
MCH: 31.9 pg (ref 26.0–34.0)
MCHC: 34.1 g/dL (ref 30.0–36.0)
MCV: 93.5 fL (ref 80.0–100.0)
Platelets: 218 K/uL (ref 150–400)
RBC: 3.83 MIL/uL — ABNORMAL LOW (ref 3.87–5.11)
RDW: 12 % (ref 11.5–15.5)
WBC: 6.5 K/uL (ref 4.0–10.5)
nRBC: 0 % (ref 0.0–0.2)

## 2024-05-13 LAB — COMPREHENSIVE METABOLIC PANEL WITH GFR
ALT: 11 U/L (ref 0–44)
AST: 16 U/L (ref 15–41)
Albumin: 3.8 g/dL (ref 3.5–5.0)
Alkaline Phosphatase: 68 U/L (ref 38–126)
Anion gap: 13 (ref 5–15)
BUN: 10 mg/dL (ref 8–23)
CO2: 25 mmol/L (ref 22–32)
Calcium: 8.9 mg/dL (ref 8.9–10.3)
Chloride: 91 mmol/L — ABNORMAL LOW (ref 98–111)
Creatinine, Ser: 1.04 mg/dL — ABNORMAL HIGH (ref 0.44–1.00)
GFR, Estimated: 52 mL/min — ABNORMAL LOW (ref >60)
Glucose, Bld: 125 mg/dL — ABNORMAL HIGH (ref 70–99)
Potassium: 4.3 mmol/L (ref 3.5–5.1)
Sodium: 129 mmol/L — ABNORMAL LOW (ref 135–145)
Total Bilirubin: 0.8 mg/dL (ref 0.0–1.2)
Total Protein: 6.2 g/dL — ABNORMAL LOW (ref 6.5–8.1)

## 2024-05-13 LAB — I-STAT CG4 LACTIC ACID, ED: Lactic Acid, Venous: 1.4 mmol/L (ref 0.5–1.9)

## 2024-05-13 LAB — SAMPLE TO BLOOD BANK

## 2024-05-13 LAB — ETHANOL: Alcohol, Ethyl (B): <15 mg/dL (ref <15)

## 2024-05-13 MED ORDER — METOCLOPRAMIDE HCL 10 MG PO TABS: 10.0000 mg | ORAL_TABLET | Freq: Three times a day (TID) | ORAL | 0 refills | Status: AC | PRN

## 2024-05-13 MED ORDER — SODIUM CHLORIDE 0.9 % IV BOLUS
1000.0000 mL | Freq: Once | INTRAVENOUS | Status: AC
  Administered 2024-05-13: 1000 mL via INTRAVENOUS

## 2024-05-13 MED ORDER — TETANUS-DIPHTH-ACELL PERTUSSIS 5-2.5-18.5 LF-MCG/0.5 IM SUSY
0.5000 mL | PREFILLED_SYRINGE | Freq: Once | INTRAMUSCULAR | Status: AC
  Administered 2024-05-13: 0.5 mL via INTRAMUSCULAR
  Filled 2024-05-13: qty 0.5

## 2024-05-13 NOTE — Discharge Instructions (Signed)
 Your history, exam, workup today did not reveal any acute fractures or intracranial bleeding.  The x-rays of the various parts of your body did not show acute fractures or bony injuries but as I suspect, you may have some soft tissue and nonbony injuries.  I suspected you were slightly dehydrated which is why we gave fluids.  The rest of your workup was reassuring and we feel safe with discharge home.  Please rest and stay hydrated and follow-up with your primary doctor.  If any symptoms change or worsen acutely, please return to the nearest emergency department.

## 2024-05-13 NOTE — Progress Notes (Signed)
   05/13/24 1130  Spiritual Encounters  Type of Visit Initial  Care provided to: Patient  Conversation partners present during encounter Nurse  Referral source Trauma page  Reason for visit Trauma  OnCall Visit Yes   Chaplain responded to a trauma in the ED - level II, fall on blood thinners. No family is present at this time, and the patient reported no needs at this time. Chaplain introduced spiritual care services. Spiritual care services available as needed.   Juliene CHRISTELLA Das, Chaplain 05/13/24

## 2024-05-13 NOTE — ED Notes (Signed)
 CCMD notified via telephone.

## 2024-05-13 NOTE — ED Provider Notes (Addendum)
 [place] EMERGENCY DEPARTMENT AT [facility] Provider Note   CSN: [id number] Arrival date & time: 05/13/24  1104     Patient presents with: [place]   Kristen [name] is a 89 y.o. female.   The history is provided by the patient, the EMS personnel and medical records. No language interpreter was used.  Fall This is a recurrent problem. The current episode started 1 to 2 hours ago. The problem occurs rarely. The problem has not changed since onset.Associated symptoms include headaches. Pertinent negatives include no chest pain, no abdominal pain and no shortness of breath. Nothing aggravates the symptoms. Nothing relieves the symptoms. She has tried nothing for the symptoms. The treatment provided no relief.       Prior to Admission medications   Medication Sig Start Date End Date Taking? Authorizing Provider  cyanocobalamin  (,VITAMIN B-12,) 1000 MCG/ML injection Inject 100 mcg into the muscle every 30 (thirty) days. 11/02/19   [provider]  desvenlafaxine (PRISTIQ) 25 MG 24 hr tablet Take 25 mg by mouth every morning. 02/29/24   [provider]  diclofenac  Sodium (VOLTAREN ) 1 % GEL Apply 4 g topically 4 (four) times daily as needed (For pain).    [provider]  docusate sodium  (COLACE) 50 MG capsule Take 2 capsules (100 mg total) by mouth daily as needed for mild constipation. 03/16/24   Amin, Ankit C, MD  ELIQUIS  5 MG TABS tablet Take 5 mg by mouth 2 (two) times daily. 09/28/22   [provider]  esomeprazole (NEXIUM) 40 MG capsule Take 40 mg by mouth daily.    [provider]  FEROSUL 325 (65 Fe) MG tablet Take 325 mg by mouth every other day. 02/29/24   [provider]  HYDROmorphone  (DILAUDID ) 2 MG tablet Take 1 tablet (2 mg total) by mouth in the morning and at bedtime. 03/16/24   Amin, Ankit C, MD  hydrOXYzine  (VISTARIL ) 25 MG capsule Take 25 mg by mouth at bedtime.    [provider]  lidocaine  (XYLOCAINE ) 2 %  solution Use as directed 15 mLs in the mouth or throat 2 (two) times daily as needed for mouth pain. 03/10/24   [provider]  losartan  (COZAAR ) 100 MG tablet Take 100 mg by mouth daily. 02/29/24   [provider]  metoCLOPramide  (REGLAN ) 5 MG tablet Take 1 tablet (5 mg total) by mouth 3 (three) times daily before meals. Patient taking differently: Take 5 mg by mouth every 8 (eight) hours as needed for vomiting, refractory nausea / vomiting or nausea. 12/30/23   Shalhoub, Zachary PARAS, MD  ondansetron  (ZOFRAN ) 8 MG tablet Take 8 mg by mouth every 8 (eight) hours as needed for nausea or vomiting.    [provider]  prochlorperazine (COMPAZINE) 5 MG tablet Take 5 mg by mouth every 6 (six) hours as needed for vomiting or nausea. 12/22/23   [provider]  sucralfate  (CARAFATE ) 1 g tablet Take 1 g by mouth 2 (two) times daily. 03/04/24   [provider]  Syringe/Needle, Disp, (SYRINGE 3CC/25GX1-1/2) 25G X 1-1/2 3 ML MISC 1 each by Does not apply route every 30 (thirty) days. For B12 injection    [provider]    Allergies: Penicillins, Tetracyclines & related, and Sulfa antibiotics    Review of Systems  Constitutional:  Positive for fatigue. Negative for chills and fever.  HENT:  Negative for congestion.   Eyes:  Negative for visual disturbance.  Respiratory:  Negative for cough, chest  tightness, shortness of breath and wheezing.   Cardiovascular:  Negative for chest pain and palpitations.  Gastrointestinal:  Positive for nausea. Negative for abdominal pain, constipation, diarrhea and vomiting.  Genitourinary:  Positive for frequency. Negative for dysuria.  Musculoskeletal:  Negative for back pain, neck pain and neck stiffness.  Skin:  Positive for wound. Negative for rash.  Neurological:  Positive for headaches. Negative for dizziness, speech difficulty, weakness, light-headedness and numbness.  Psychiatric/Behavioral:  Negative for agitation and  confusion.   All other systems reviewed and are negative.   Updated Vital Signs Ht 5' 4 (1.626 m)   Wt 52.2 kg   LMP  (LMP Unknown)   SpO2 98%   BMI 19.74 kg/m   Physical Exam Vitals and nursing note reviewed.  Constitutional:      General: She is not in acute distress.    Appearance: She is well-developed. She is not ill-appearing, toxic-appearing or diaphoretic.  HENT:     Head: Normocephalic and atraumatic.     Nose: No congestion or rhinorrhea.     Mouth/Throat:     Mouth: Mucous membranes are moist.     Pharynx: No oropharyngeal exudate or posterior oropharyngeal erythema.  Eyes:     Extraocular Movements: Extraocular movements intact.     Conjunctiva/sclera: Conjunctivae normal.     Pupils: Pupils are equal, round, and reactive to light.  Cardiovascular:     Rate and Rhythm: Normal rate and regular rhythm.     Heart sounds: No murmur heard. Pulmonary:     Effort: Pulmonary effort is normal. No respiratory distress.     Breath sounds: Normal breath sounds. No wheezing, rhonchi or rales.  Chest:     Chest wall: No tenderness.  Abdominal:     General: Abdomen is flat.     Palpations: Abdomen is soft.     Tenderness: There is no abdominal tenderness. There is no right CVA tenderness, left CVA tenderness, guarding or rebound.  Musculoskeletal:        General: Tenderness and signs of injury present. No swelling.     Cervical back: Neck supple.  Skin:    General: Skin is warm and dry.     Capillary Refill: Capillary refill takes less than 2 seconds.     Findings: Bruising present. No rash.  Neurological:     General: No focal deficit present.     Mental Status: She is alert.     Sensory: No sensory deficit.     Motor: No weakness.  Psychiatric:        Mood and Affect: Mood normal.     (all labs ordered are listed, but only abnormal results are displayed) Labs Reviewed  COMPREHENSIVE METABOLIC PANEL WITH GFR - Abnormal; Notable for the following components:       Result Value   Sodium 129 (*)    Chloride 91 (*)    Glucose, Bld 125 (*)    Creatinine, Ser 1.04 (*)    Total Protein 6.2 (*)    GFR, Estimated 52 (*)    All other components within normal limits  CBC - Abnormal; Notable for the following components:   RBC 3.83 (*)    HCT 35.8 (*)    All other components within normal limits  I-STAT CHEM 8, ED - Abnormal; Notable for the following components:   Sodium 127 (*)    Chloride 92 (*)    Glucose, Bld 119 (*)    Calcium , Ion 1.14 (*)    All  other components within normal limits  ETHANOL  URINALYSIS, ROUTINE W REFLEX MICROSCOPIC  I-STAT CG4 LACTIC ACID, ED  SAMPLE TO BLOOD BANK    EKG: EKG Interpretation Date/Time:  Saturday May 13 2024 11:13:00 EDT Ventricular Rate:  69 PR Interval:  174 QRS Duration:  86 QT Interval:  416 QTC Calculation: 446 R Axis:   51  Text Interpretation: Sinus rhythm Abnormal R-wave progression, early transition Consider anterior infarct similar appearance to prior No STeMI Confirmed by Ginger Barefoot ([id number]) on 05/13/2024 11:36:00 AM  Radiology: CT HEAD WO CONTRAST Result Date: 05/13/2024 CLINICAL DATA:  Provided history: Head trauma, moderate-severe; Polytrauma, blunt EXAM: CT HEAD WITHOUT CONTRAST CT CERVICAL SPINE WITHOUT CONTRAST TECHNIQUE: Multidetector CT imaging of the head and cervical spine was performed following the standard protocol without intravenous contrast. Multiplanar CT image reconstructions of the cervical spine were also generated. RADIATION DOSE REDUCTION: This exam was performed according to the departmental dose-optimization program which includes automated exposure control, adjustment of the mA and/or kV according to patient size and/or use of iterative reconstruction technique. COMPARISON:  Head CT 12/09/2023. Cervical spine CT 12/09/2023. Chest CT 03/13/2024. FINDINGS: CT HEAD FINDINGS Brain: Generalized parenchymal atrophy. Chronic encephalomalacia/gliosis within the anterior left  temporal lobe (deep to pterional craniectomy site). Unchanged 9 mm partially calcified meningioma along the left parietal lobe. No significant mass effect upon the underlying brain parenchyma. Patchy and ill-defined hypoattenuation within the cerebral white matter, nonspecific but compatible with mild chronic small vessel ischemic disease. There is no acute intracranial hemorrhage. No demarcated cortical infarct. No extra-axial fluid collection. No midline shift. Vascular: No hyperdense vessel.  Atherosclerotic calcifications. Skull: Prior left pterional craniectomy. Right parietal cranioplasty. Left occipital cranioplasty. No acute calvarial fracture. Sinuses/Orbits: No orbital mass or acute orbital finding. No significant paranasal sinus disease. Other: Chronic bilateral nasal bone fracture deformities. CT CERVICAL SPINE FINDINGS Alignment: Dextrocurvature of the cervical spine. 2 mm grade 1 anterolisthesis at C7-T1 and T1-T2. Skull base and vertebrae: The basion-dental and atlanto-dental intervals are maintained.No evidence of acute fracture to the cervical spine. Small lucent foci scattered within the spine at the cervical and visualized upper thoracic levels, similar to the prior cervical spine CT of 12/09/2023. Soft tissues and spinal canal: No prevertebral fluid or swelling. No visible canal hematoma. Disc levels: Cervical spondylosis with multilevel disc space narrowing, disc bulges/central disc protrusions, posterior disc osteophyte complexes and uncovertebral hypertrophy. Disc space narrowing is greatest at C4-C5, C5-C6 and C6-C7 (advanced at these levels). No appreciable high-grade spinal canal stenosis. Multilevel bony neural foraminal narrowing. Upper chest: Partially imaged known right upper lobe pulmonary nodule (series 5, image 80). No visible pneumothorax. IMPRESSION: CT head: 1.  No evidence of an acute intracranial abnormality. 2. Chronic encephalomalacia/gliosis within the anterior left temporal  lobe (deep to pterional craniectomy site). 3. Unchanged 4 mm rounded extra-axial calcific focus overlying the high left frontal lobe, which may reflect a dural calcification, osteoma of or small meningioma. 4. Background parenchymal atrophy and chronic small vessel ischemic disease. CT cervical spine: 1. No evidence of an acute cervical spine fracture. 2. Dextrocurvature of the cervical spine. 3. Mild grade 1 anterolisthesis at C7-T1 and T1-T2. 4. Cervical spondylosis as described. 5. Small lucent foci scattered within the spine at the cervical and visualized upper thoracic levels, similar to the prior cervical spine CT of 12/09/2023. While this could reflect osseous demineralization, more worrisome etiologies (such as multiple myeloma and osseous metastatic disease) cannot be excluded. 6. Known right upper lobe pulmonary nodule, incompletely  imaged on the current exam. Please refer to the prior chest CT of 03/13/2024 for further description and for recommendations. Electronically Signed   By: Rockey Childs D.O.   On: 05/13/2024 12:50   CT CERVICAL SPINE WO CONTRAST Result Date: 05/13/2024 CLINICAL DATA:  Provided history: Head trauma, moderate-severe; Polytrauma, blunt EXAM: CT HEAD WITHOUT CONTRAST CT CERVICAL SPINE WITHOUT CONTRAST TECHNIQUE: Multidetector CT imaging of the head and cervical spine was performed following the standard protocol without intravenous contrast. Multiplanar CT image reconstructions of the cervical spine were also generated. RADIATION DOSE REDUCTION: This exam was performed according to the departmental dose-optimization program which includes automated exposure control, adjustment of the mA and/or kV according to patient size and/or use of iterative reconstruction technique. COMPARISON:  Head CT 12/09/2023. Cervical spine CT 12/09/2023. Chest CT 03/13/2024. FINDINGS: CT HEAD FINDINGS Brain: Generalized parenchymal atrophy. Chronic encephalomalacia/gliosis within the anterior left  temporal lobe (deep to pterional craniectomy site). Unchanged 9 mm partially calcified meningioma along the left parietal lobe. No significant mass effect upon the underlying brain parenchyma. Patchy and ill-defined hypoattenuation within the cerebral white matter, nonspecific but compatible with mild chronic small vessel ischemic disease. There is no acute intracranial hemorrhage. No demarcated cortical infarct. No extra-axial fluid collection. No midline shift. Vascular: No hyperdense vessel.  Atherosclerotic calcifications. Skull: Prior left pterional craniectomy. Right parietal cranioplasty. Left occipital cranioplasty. No acute calvarial fracture. Sinuses/Orbits: No orbital mass or acute orbital finding. No significant paranasal sinus disease. Other: Chronic bilateral nasal bone fracture deformities. CT CERVICAL SPINE FINDINGS Alignment: Dextrocurvature of the cervical spine. 2 mm grade 1 anterolisthesis at C7-T1 and T1-T2. Skull base and vertebrae: The basion-dental and atlanto-dental intervals are maintained.No evidence of acute fracture to the cervical spine. Small lucent foci scattered within the spine at the cervical and visualized upper thoracic levels, similar to the prior cervical spine CT of 12/09/2023. Soft tissues and spinal canal: No prevertebral fluid or swelling. No visible canal hematoma. Disc levels: Cervical spondylosis with multilevel disc space narrowing, disc bulges/central disc protrusions, posterior disc osteophyte complexes and uncovertebral hypertrophy. Disc space narrowing is greatest at C4-C5, C5-C6 and C6-C7 (advanced at these levels). No appreciable high-grade spinal canal stenosis. Multilevel bony neural foraminal narrowing. Upper chest: Partially imaged known right upper lobe pulmonary nodule (series 5, image 80). No visible pneumothorax. IMPRESSION: CT head: 1.  No evidence of an acute intracranial abnormality. 2. Chronic encephalomalacia/gliosis within the anterior left temporal  lobe (deep to pterional craniectomy site). 3. Unchanged 4 mm rounded extra-axial calcific focus overlying the high left frontal lobe, which may reflect a dural calcification, osteoma of or small meningioma. 4. Background parenchymal atrophy and chronic small vessel ischemic disease. CT cervical spine: 1. No evidence of an acute cervical spine fracture. 2. Dextrocurvature of the cervical spine. 3. Mild grade 1 anterolisthesis at C7-T1 and T1-T2. 4. Cervical spondylosis as described. 5. Small lucent foci scattered within the spine at the cervical and visualized upper thoracic levels, similar to the prior cervical spine CT of 12/09/2023. While this could reflect osseous demineralization, more worrisome etiologies (such as multiple myeloma and osseous metastatic disease) cannot be excluded. 6. Known right upper lobe pulmonary nodule, incompletely imaged on the current exam. Please refer to the prior chest CT of 03/13/2024 for further description and for recommendations. Electronically Signed   By: Rockey Childs D.O.   On: 05/13/2024 12:50   DG Pelvis Portable Result Date: 05/13/2024 CLINICAL DATA:  Trauma.  Status post fall EXAM: PORTABLE PELVIS 1-2 VIEWS  COMPARISON:  10/30/2023 FINDINGS: Status post right hip arthroplasty. There are no signs of acute fracture or dislocation. No evidence for pelvic diastasis or fracture. IMPRESSION: 1. No acute findings. 2. Status post right hip arthroplasty. Electronically Signed   By: Waddell Calk M.D.   On: 05/13/2024 12:11   DG Tibia/Fibula Right Port Result Date: 05/13/2024 CLINICAL DATA:  Pain after fall EXAM: PORTABLE RIGHT TIBIA AND FIBULA - 2 VIEW COMPARISON:  None Available. FINDINGS: Osteopenia. Knee arthroplasty seen at the edge of the imaging field. The tibial component is cemented. Dedicated knee x-rays could be performed as clinically appropriate to further delineate. No acute fracture or dislocation. IMPRESSION: Osteopenia. No acute osseous abnormality. Knee  arthroplasty incompletely included at the edge of the imaging field. Electronically Signed   By: Ranell Bring M.D.   On: 05/13/2024 12:11   DG Humerus Right Result Date: 05/13/2024 CLINICAL DATA:  Pain after fall EXAM: RIGHT HUMERUS - 2 VIEW COMPARISON:  None Available. FINDINGS: Osteopenia. No fracture or dislocation. Preserved adjacent joint spaces. IMPRESSION: Osteopenia.  No acute osseous abnormality. Electronically Signed   By: Ranell Bring M.D.   On: 05/13/2024 12:10   DG Forearm Left Result Date: 05/13/2024 CLINICAL DATA:  Pain after fall EXAM: LEFT FOREARM - 2 VIEW COMPARISON:  None Available. FINDINGS: Osteopenia. No fracture or dislocation. Grossly preserved adjacent joint spaces. IV site along the antecubital region. IMPRESSION: No acute osseous abnormality. Electronically Signed   By: Ranell Bring M.D.   On: 05/13/2024 12:08   DG Chest Port 1 View Result Date: 05/13/2024 CLINICAL DATA:  Pain after fall EXAM: PORTABLE CHEST 1 VIEW COMPARISON:  X-ray 03/06/2024 FINDINGS: No consolidation, pneumothorax or effusion. Normal cardiopericardial silhouette without edema. Overlapping cardiac leads. Osteopenia. Film is slightly rotated. Stable dense left apical nodule. Likely calcified. IMPRESSION: No acute cardiopulmonary disease. Electronically Signed   By: Ranell Bring M.D.   On: 05/13/2024 12:07     Procedures   Medications Ordered in the ED  Tdap (BOOSTRIX ) injection 0.5 mL (0.5 mLs Intramuscular Given 05/13/24 1309)  sodium chloride  0.9 % bolus 1,000 mL (0 mLs Intravenous Stopped 05/13/24 1450)                                    Medical Decision Making Amount and/or Complexity of Data Reviewed Labs: ordered. Radiology: ordered.  Risk Prescription drug management.    Kristen [name] is a 89 y.o. female with a past medical history significant for previous seizures, CKD, depression, previous pulmonary embolism on Eliquis  therapy, previous hysterectomy, previous cholecystectomy,  previous lung adenocarcinoma, and hyperlipidemia who presents with falls.  According to EMS report and patient report, she has had several falls over the last week.  She reports she hurt her arm several days ago on her right upper arm and has a skin tear from it.  She reports she did not get evaluated for this.  She reports today she was trying to get a sweater on and got tangled up and fell to ground.  She reports it was mechanical and she did not have any lightheadedness dizziness or preceding symptoms such as chest pain palpitation shortness of breath.  She reports she did not hit her head but she did not get knocked out.  She is complaining of some mild pain in her left temple area.  She denies any other chest pain or abdominal pain or back pain.  She reports  pain in her right upper arm, her left forearm where she has a skin tear as well, and pain in her right shin.  Denies any hip or knee pains.  She does state she had several falls over the last several days.  Does report feeling tired, has some nausea, and has had urinary frequency.  Denies dysuria.  Denies need chest pain, shortness breath or cough.  Denies fevers or chills.  Denies any neck pain or neck stiffness.  Denies any other preceding complaints.  On exam, lungs clear.  Chest is nontender.  Back is nontender.  Abdomen nontender.  Patient did not have tenderness in the hips or knees without tenderness to the right shin.  She had a skin tear that is old bruising in her right upper arm that was tender.  She has a skin tear and tenderness to her left lateral forearm.  She is right-handed.  It was somewhat tender on that left arm.  Pupils are symmetric and reactive with normal extraocular movements.  Neck nontender.  Some tenderness on her head.  No other lacerations on her head and neck.  As patient hit her head and fell on Eliquis  she was activated by the team as a level 2 trauma.  She will get CT of the head and neck for this.  She will also get  chest x-ray and portable pelvis x-ray and we will get x-rays of her right upper arm, left forearm, and right shin where she was hurting and head injuries.  Will get screening labs and given her urinary frequency and fatigue will get urinalysis.  Will hold on CT of the chest/abdomen/pelvis as she had no symptoms in her chest, abdomen, or pelvis at this time.  Anticipate reassessment after workup to determine disposition.         1:08 PM Workup is returned reassuring.  CT head and neck did not show acute traumatic injuries.  Chronic changes seen in both images.  Other x-rays did not show significant acute traumatic injuries at this time.  Urinalysis did not show convincing evidence of UTI.  Patient sodium has been 129 2 months ago and is similar.  The i-STAT was 127 but the laboratory test was 129 and seems similar to prior.  Patient will eat and drink and is telling me she would like to go home now.  She reports feeling well.  1:18 PM Family arrived and was able to answer more questions.  They think patient could be slightly dehydrated as she is not eating or drinking as much due to a shift change at her facility.  They would like her to get some fluids we will give her a liter of fluids.  They said that she has had some chronic nausea issues and has tried Zofran  and Compazine and Phenergan without much success.  Will print a prescription for Reglan  to try at home instead of those.  She is not nauseous now so will not give it to her now.  She will follow-up with her PCP and outpatient team for further management of the other incidental findings we discussed with family and herself.  Patient will be discharged after rehydration.  2:54 PM Patient finished her fluids and is now sitting up without difficulty.  She is feeling better.  Will discharge for outpatient follow-up and family agrees.  3:10 PM Pressure was elevated.  I suspect this may be related to all of the fluid she just received and the trauma  as patient had no  other symptoms and was well-appearing.  She was directed to follow-up with her PCP for recheck of her blood pressure soon.  Patient will be discharged for outpatient follow-up.    Final diagnoses:  Fall, initial encounter    ED Discharge Orders          Ordered    metoCLOPramide  (REGLAN ) 10 MG tablet  Every 8 hours PRN        05/13/24 1455           Clinical Impression: 1. Fall, initial encounter     Disposition: Discharge  Condition: Good  I have discussed the results, Dx and Tx plan with the pt(& family if present). He/she/they expressed understanding and agree(s) with the plan. Discharge instructions discussed at great length. Strict return precautions discussed and pt &/or family have verbalized understanding of the instructions. No further questions at time of discharge.    New Prescriptions   METOCLOPRAMIDE  (REGLAN ) 10 MG TABLET    Take 1 tablet (10 mg total) by mouth every 8 (eight) hours as needed for nausea.    Follow Up: Charlott Dorn LABOR, MD [address] [place] [address] [phone]     [facility] Emergency Department at [facility] [address] [place]  [address] [phone]        Raeleigh Guinn, Lonni PARAS, MD 05/13/24 1457    Kionte Baumgardner, Lonni PARAS, MD 05/13/24 1510

## 2024-05-13 NOTE — Progress Notes (Signed)
 Orthopedic Tech Progress Note Patient Details:  Kristen [name] [birth year] [id number]  Level II trauma, no ortho tech orders at this time.  Patient ID: Kristen [name], female   DOB: August 11, 1933, 89 y.o.   MRN: [id number]  Kristen [name] 05/13/2024, 12:12 PM

## 2024-05-13 NOTE — ED Triage Notes (Signed)
 Pt BIB EMS from [facility]. Pt had a fall this morning due to losing her balance. Pt states she was getting dressed when she lost her balance and fell backwards laying on her back then hit her head on the floor. - LOC, on eliquis . This is third fall this week. A&Ox4

## 2024-05-19 DIAGNOSIS — H18422 Band keratopathy, left eye: Secondary | ICD-10-CM | POA: Diagnosis not present

## 2024-05-19 DIAGNOSIS — H04122 Dry eye syndrome of left lacrimal gland: Secondary | ICD-10-CM | POA: Diagnosis not present

## 2024-05-19 NOTE — Patient Outreach (Signed)
 Complex Care Management   Visit Note  05/12/2024  Name:  Kristen [name] MRN: [id number] DOB: [birth year]  Situation: Referral received for Complex Care Management related to Mental/Behavioral Health diagnosis Anxiety I obtained verbal consent from Caregiver.  Visit completed with Caregiver, Devere  on the phone  Background:   Past Medical History:  Diagnosis Date   Anemia    Arthritis    B12 deficiency    Chronic kidney disease    Chronic pain disorder    trigeminal neuralgia   Depression    Dyslipidemia    Hypertension    PONV (postoperative nausea and vomiting)    Seizure disorder (HCC)    decades since last seizure   Tremor    Trigeminal nerve disorder     Assessment: Patient Reported Symptoms:  Cognitive Cognitive Status: Normal speech and language skills (per daughter)      Neurological Neurological Review of Symptoms: Not assessed    HEENT HEENT Symptoms Reported: Not assessed      Cardiovascular Cardiovascular Symptoms Reported: Not assessed    Respiratory Respiratory Symptoms Reported: Not assesed    Endocrine Endocrine Symptoms Reported: Not assessed    Gastrointestinal Gastrointestinal Symptoms Reported: Change in appetite      Genitourinary Genitourinary Symptoms Reported: Not assessed    Integumentary Integumentary Symptoms Reported: Not assessed    Musculoskeletal Musculoskelatal Symptoms Reviewed: Unsteady gait Additional Musculoskeletal Details: Patient has fallen twice this week. Does not want to utilize walker, per family   Falls in the past year?: Yes Number of falls in past year: 2 or more Was there an injury with Fall?: No Fall Risk Category Calculator: 2 Patient Fall Risk Level: Moderate Fall Risk Patient at Risk for Falls Due to: History of fall(s), Impaired mobility Fall risk Follow up: Falls prevention discussed  Psychosocial Psychosocial Symptoms Reported: Other Other Psychosocial Conditions: Caregiver Stress             No data  to display          There were no vitals filed for this visit.  Medications Reviewed Today     Reviewed by Berenise Hunton D, LCSW (Social Worker) on 05/19/24 at 1454  Med List Status: <None>   Medication Order Taking? Sig Documenting Provider Last Dose Status Informant  cyanocobalamin  (,VITAMIN B-12,) 1000 MCG/ML injection [id number]  Inject 100 mcg into the muscle every 30 (thirty) days. [provider]  Active Other, Pharmacy Records, Multiple Informants, Care Giver, Self           Med Note RENNIS, DUROJAHYE' R   Mon Mar 13, 2024 10:32 PM)    desvenlafaxine (PRISTIQ) 25 MG 24 hr tablet [id number]  Take 25 mg by mouth every morning. [provider]  Active Other, Pharmacy Records, Multiple Informants, Care Giver, Self  diclofenac  Sodium (VOLTAREN ) 1 % GEL [id number]  Apply 4 g topically 4 (four) times daily as needed (For pain). [provider]  Active Other, Pharmacy Records, Multiple Informants, Care Giver, Self  docusate sodium  (COLACE) 50 MG capsule [id number]  Take 2 capsules (100 mg total) by mouth daily as needed for mild constipation. Amin, Ankit C, MD  Active   ELIQUIS  5 MG TABS tablet [id number]  Take 5 mg by mouth 2 (two) times daily. [provider]  Active Other, Pharmacy Records, Multiple Informants, Care Giver, Self  esomeprazole (NEXIUM) 40 MG capsule [id number]  Take 40 mg by mouth daily. [provider]  Active Other, Pharmacy Records, Multiple Informants, Care Giver, Self  FEROSUL  325 (65 Fe) MG tablet [id number]  Take 325 mg by mouth every other day. [provider]  Active Other, Pharmacy Records, Multiple Informants, Care Giver, Self  HYDROmorphone  (DILAUDID ) 2 MG tablet [id number]  Take 1 tablet (2 mg total) by mouth in the morning and at bedtime. Caleen Burgess BROCKS, MD  Active   hydrOXYzine  (VISTARIL ) 25 MG capsule [id number]  Take 25 mg by mouth at bedtime. [provider]  Active Other, Pharmacy Records, Multiple  Informants, Care Giver, Self           Med Note (SATTERFIELD, TEENA BRAVO   Fri Dec 24, 2023  3:41 PM) Daughter verified patient is taking this medication   lidocaine  (XYLOCAINE ) 2 % solution [id number]  Use as directed 15 mLs in the mouth or throat 2 (two) times daily as needed for mouth pain. [provider]  Active Other, Pharmacy Records, Multiple Informants, Care Giver, Self  losartan  (COZAAR ) 100 MG tablet [id number]  Take 100 mg by mouth daily. [provider]  Active Other, Pharmacy Records, Multiple Informants, Care Giver, Self  metoCLOPramide  (REGLAN ) 10 MG tablet [id number]  Take 1 tablet (10 mg total) by mouth every 8 (eight) hours as needed for nausea. Tegeler, Lonni PARAS, MD  Active   metoCLOPramide  (REGLAN ) 5 MG tablet [id number]  Take 1 tablet (5 mg total) by mouth 3 (three) times daily before meals.  Patient taking differently: Take 5 mg by mouth every 8 (eight) hours as needed for vomiting, refractory nausea / vomiting or nausea.   Shalhoub, Zachary PARAS, MD  Active Other, Pharmacy Records, Multiple Informants, Care Giver, Self  ondansetron  (ZOFRAN ) 8 MG tablet [id number]  Take 8 mg by mouth every 8 (eight) hours as needed for nausea or vomiting. [provider]  Active Other, Pharmacy Records, Multiple Informants, Care Giver, Self  prochlorperazine (COMPAZINE) 5 MG tablet [id number]  Take 5 mg by mouth every 6 (six) hours as needed for vomiting or nausea. [provider]  Active Other, Pharmacy Records, Multiple Informants, Care Giver, Self  sucralfate  (CARAFATE ) 1 g tablet [id number]  Take 1 g by mouth 2 (two) times daily. [provider]  Active Other, Pharmacy Records, Multiple Informants, Care Giver, Self  Syringe/Needle, Disp, (SYRINGE 3CC/25GX1-1/2) 25G X 1-1/2 3 ML MISC [id number]  1 each by Does not apply route every 30 (thirty) days. For B12 injection [provider]  Active Other, Pharmacy Records, Multiple Informants, Care Giver,  Self  Med List Note Steffi Nian, CPhT 03/14/24 1304): [facility]             Recommendation:   Continue Current Plan of Care  Follow Up Plan:   Telephone follow-up 2-4 weeks  Rolin Kerns, LCSW [place]  [facility], [facility] Clinical Social Worker Direct Dial: [phone]  Fax: [phone] Website: [email or web address] 3:02 PM

## 2024-05-22 DIAGNOSIS — M7062 Trochanteric bursitis, left hip: Secondary | ICD-10-CM | POA: Diagnosis not present

## 2024-05-22 NOTE — Patient Instructions (Signed)
 Visit Information  Thank you for taking time to visit with me today. Please don't hesitate to contact me if I can be of assistance to you before our next scheduled appointment.  Your next care management appointment is by telephone on 08/01 at 11 AM  Please call the care guide team at [phone] if you need to cancel, schedule, or reschedule an appointment.   Please call the Suicide and Crisis Lifeline: 988 go to [facility] Urgent [facility] [address], [place] [id number]) call 911 if you are experiencing a Mental Health or Behavioral Health Crisis or need someone to talk to.  Rolin Kerns, LCSW [place]  [facility], [facility] Clinical Social Worker Direct Dial: [phone]  Fax: [phone] Website: [email or web address] 9:40 AM

## 2024-05-24 DIAGNOSIS — Z96641 Presence of right artificial hip joint: Secondary | ICD-10-CM | POA: Diagnosis not present

## 2024-05-24 DIAGNOSIS — M25551 Pain in right hip: Secondary | ICD-10-CM | POA: Diagnosis not present

## 2024-05-26 ENCOUNTER — Other Ambulatory Visit: Payer: Self-pay | Admitting: Licensed Clinical Social Worker

## 2024-05-26 IMAGING — RF DG ESOPHAGUS
12 of 13 series · 14 of 24 positions shown · non-contrast
Comparison: 11/23/2021 chest CT angiogram

CLINICAL DATA: Dysphagia with choking episodes and excessive phlegm
production. Globus sensation in the chest.

EXAM:
ESOPHOGRAM / BARIUM SWALLOW / BARIUM TABLET STUDY
TECHNIQUE: Combined double contrast and single contrast examination performed
using effervescent crystals, thick barium liquid, and thin barium
liquid. The patient was observed with fluoroscopy swallowing a 13 mm
barium sulphate tablet.
FLUOROSCOPY:
Radiation Exposure Index (as provided by the fluoroscopic device):
11.9 mGy Kerma

[Series 1: sequence · 1 of 30 frames shown (1 of 8)]
[frame 5/30]
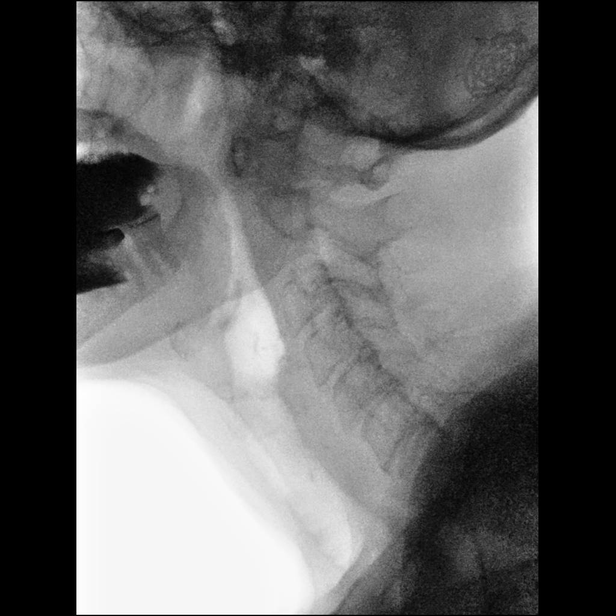

[Series 2: one shot · 0.15mm/px · 1 of 1 slices shown (1 of 4)]
[im 1/1]
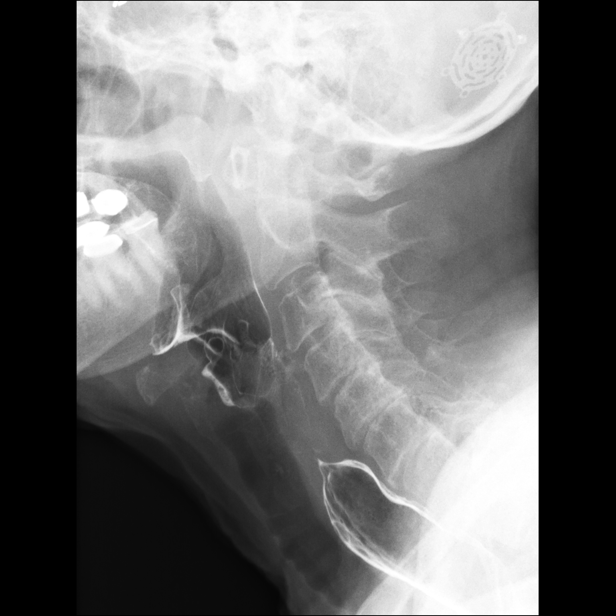

[Series 3: sequence · 1 of 18 frames shown (2 of 8)]
[frame 16/18]
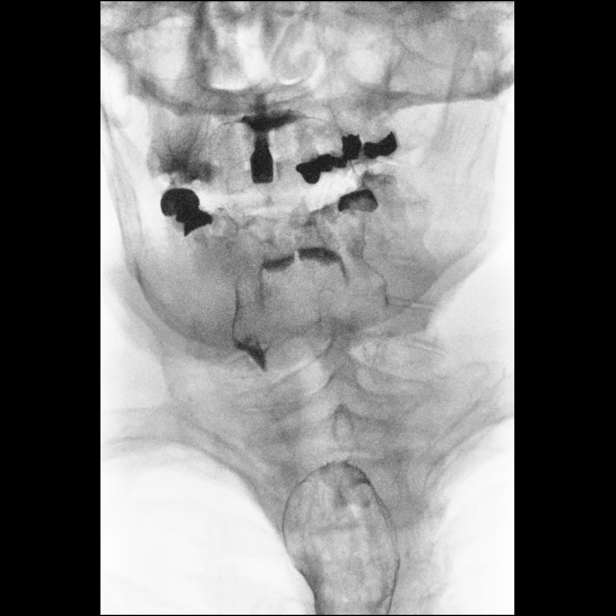

[Series 5: sequence · 2 of 22 frames shown (3 of 8)]
[frame 4/22]
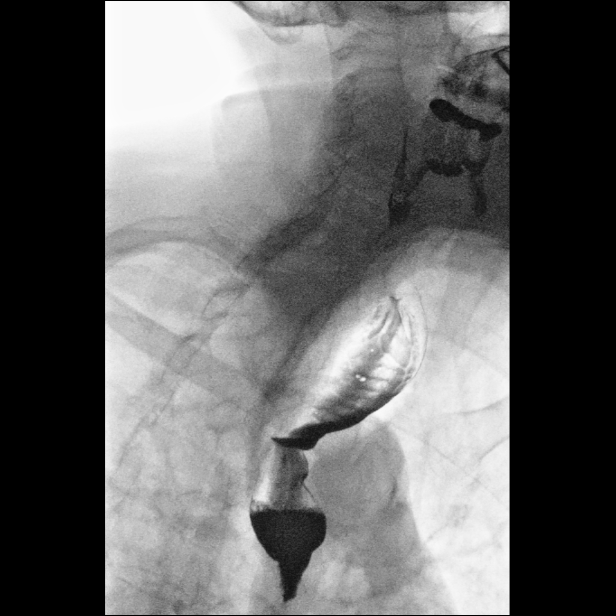
[frame 19/22]
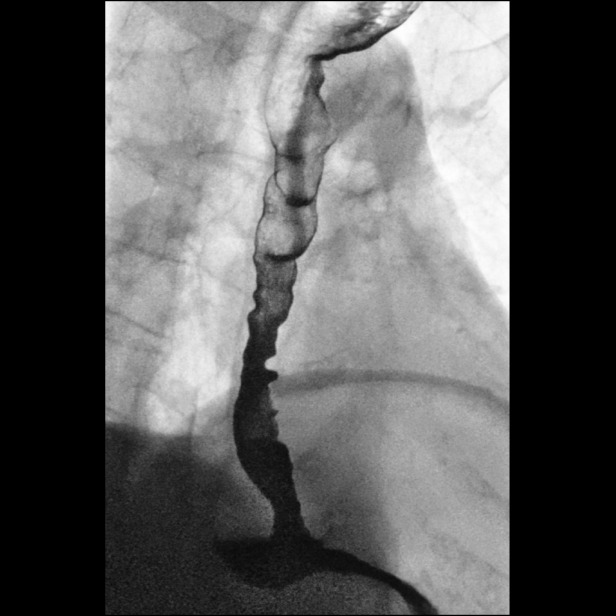

[Series 6: one shot · 0.15mm/px · 2 of 8 slices shown (2 of 4)]
[im 3/8]
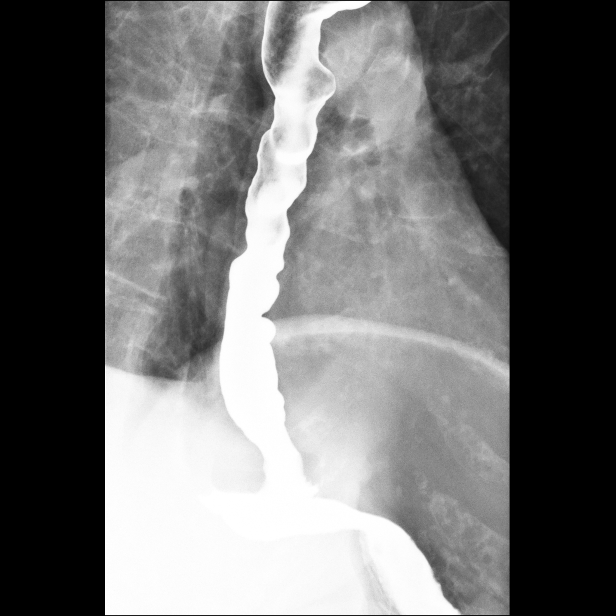
[im 7/8]
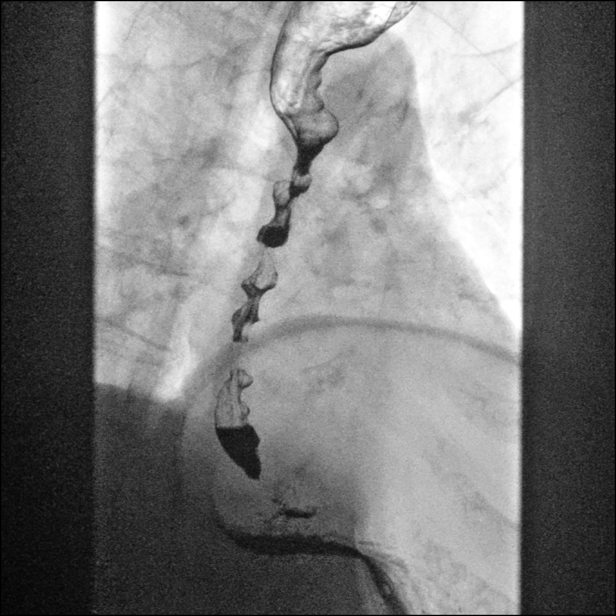

[Series 7: sequence · 1 of 25 frames shown (4 of 8)]
[frame 4/25]
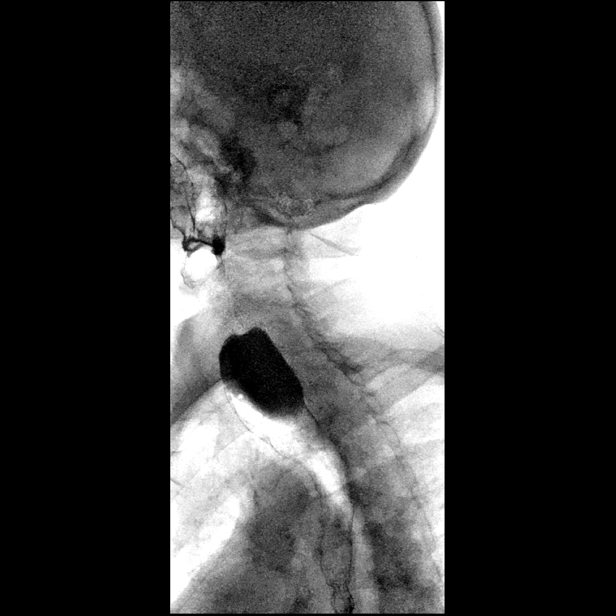

[Series 8: sequence · 1 of 52 frames shown (5 of 8)]
[frame 8/52]
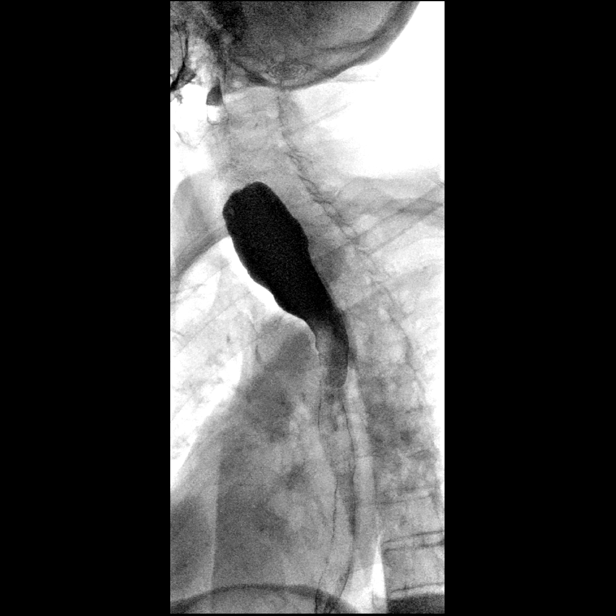

[Series 9: sequence · 1 of 126 frames shown (6 of 8)]
[frame 19/126]
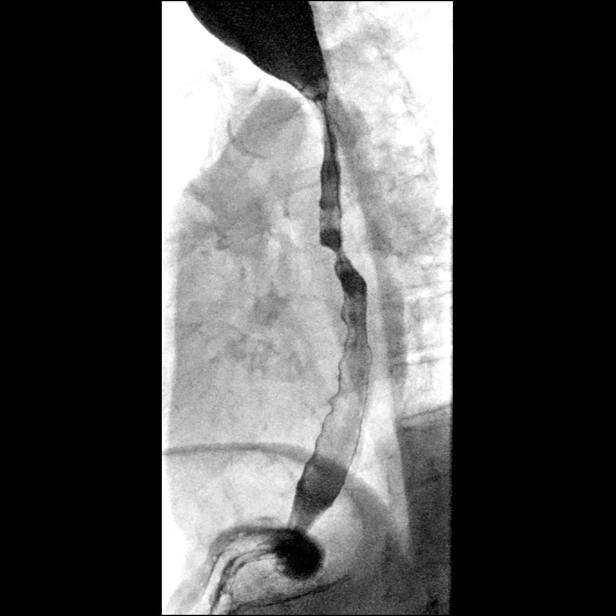

[Series 10: one shot · 1 of 2 slices shown (3 of 4)]
[im 1/2]
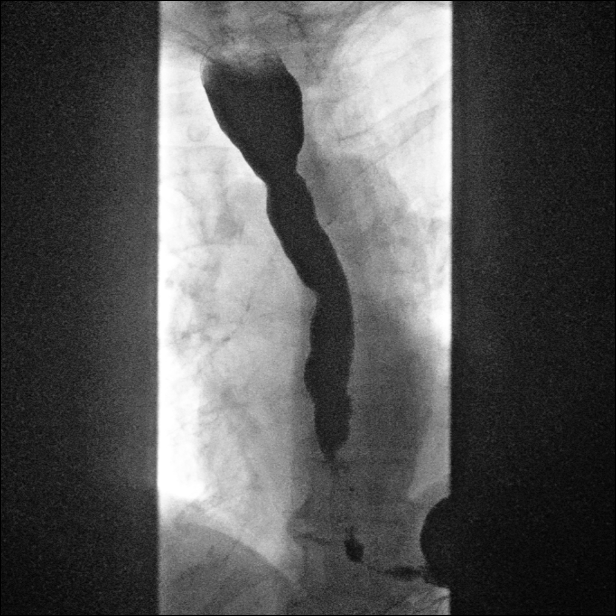

[Series 11: sequence · 1 of 77 frames shown (7 of 8)]
[frame 11/77]
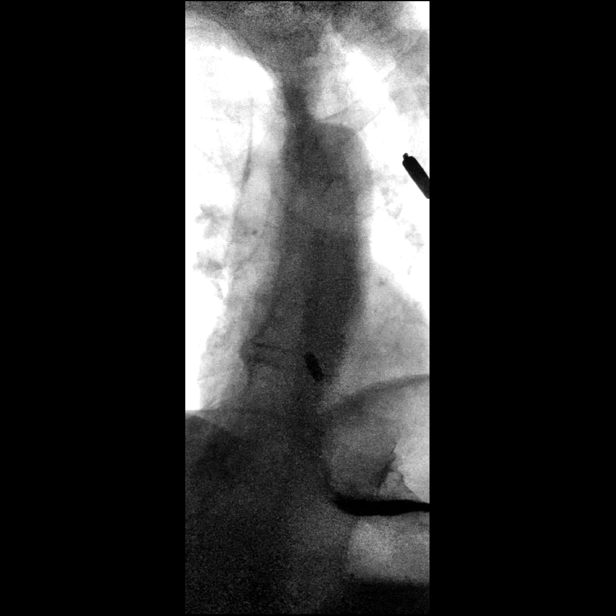

[Series 12: sequence · 1 of 28 frames shown (8 of 8)]
[frame 2/28]
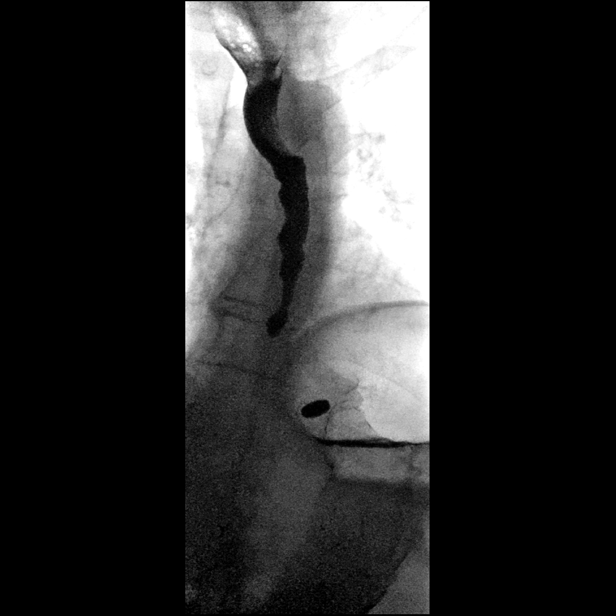

[Series 13: one shot · 1 of 1 slices shown (4 of 4)]
[im 1/1]
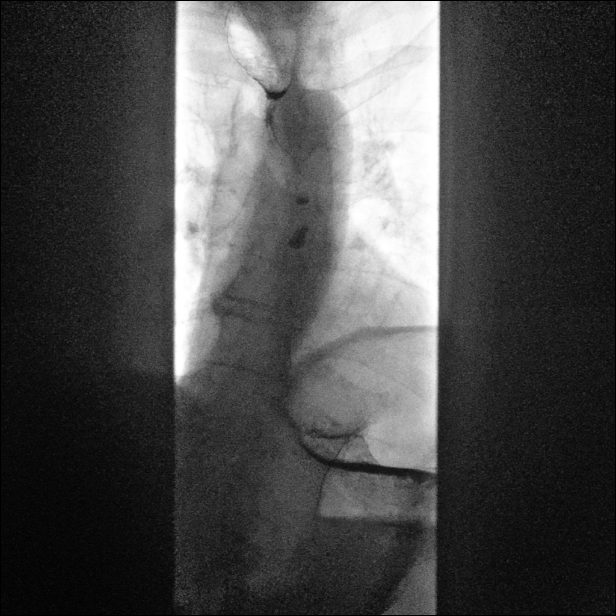

[14 of 24 positions shown; findings below may reference images not displayed]

FINDINGS: Normal oral and pharyngeal phases of swallowing, with no laryngeal
penetration or tracheobronchial aspiration. No significant barium
retention in the pharynx. No evidence of pharyngeal mass, stricture
or diverticulum.

Severe esophageal dysmotility with significant weakening of primary
peristalsis throughout the thoracic esophagus and with tertiary
contractions throughout the thoracic esophagus. No hiatal hernia. No
gastroesophageal reflux elicited. Mildly patulous upper thoracic
esophagus. Otherwise normal esophageal distensibility with no
evidence of esophageal mass, ulcer or stricture. The barium tablet
was transiently delayed at the esophagogastric junction with tablet
clearance into the stomach with a single thin barium swallow.
IMPRESSION: 1. Severe esophageal dysmotility, with a dysmotility pattern
characteristic of presbyesophagus.
2. No hiatal hernia. No gastroesophageal reflux elicited.
3. No evidence of esophageal mass or stricture.

## 2024-05-26 NOTE — Patient Outreach (Signed)
 Complex Care Management   Visit Note  05/26/2024  Name:  Kristen [name] MRN: [id number] DOB: Jun 30, 1933  Situation: Referral received for Complex Care Management related to Mental/Behavioral Health diagnosis Anxiety and Caregiver Stress I obtained verbal consent from Caregiver.  Visit completed with Caregiver, Devere  on the phone  Background:   Past Medical History:  Diagnosis Date   Anemia    Arthritis    B12 deficiency    Chronic kidney disease    Chronic pain disorder    trigeminal neuralgia   Depression    Dyslipidemia    Hypertension    PONV (postoperative nausea and vomiting)    Seizure disorder (HCC)    decades since last seizure   Tremor    Trigeminal nerve disorder     Assessment: Patient Reported Symptoms:  Cognitive Cognitive Status: Normal speech and language skills Cognitive/Intellectual Conditions Management [RPT]: None reported or documented in medical history or problem list      Neurological Neurological Review of Symptoms: Not assessed    HEENT HEENT Symptoms Reported: Not assessed      Cardiovascular Cardiovascular Symptoms Reported: Not assessed    Respiratory Respiratory Symptoms Reported: Not assesed    Endocrine Endocrine Symptoms Reported: Not assessed    Gastrointestinal Gastrointestinal Symptoms Reported: Not assessed      Genitourinary Genitourinary Symptoms Reported: Not assessed    Integumentary Integumentary Symptoms Reported: Not assessed    Musculoskeletal Musculoskelatal Symptoms Reviewed: Weakness Additional Musculoskeletal Details: Patient had another fall where she bruised hip, no fractures. Since then Caregiver has had pt f/up with Ortho and eye doctor Musculoskeletal Management Strategies: Routine screening, Coping strategies Falls in the past year?: Yes Number of falls in past year: 2 or more Was there an injury with Fall?: No Fall Risk Category Calculator: 2 Patient Fall Risk Level: Moderate Fall Risk Patient at Risk for  Falls Due to: History of fall(s) Fall risk Follow up: Falls prevention discussed  Psychosocial Psychosocial Symptoms Reported: Other Other Psychosocial Conditions: Caregiver Stress Additional Psychological Details: Pt is residing with Caregiver temporarily. Endorsed pt is not open to moving from [place] at this time.             No data to display          There were no vitals filed for this visit.  Medications Reviewed Today     Reviewed by Khanh Cordner D, LCSW (Social Worker) on 05/26/24 at 1126  Med List Status: <None>   Medication Order Taking? Sig Documenting Provider Last Dose Status Informant  cyanocobalamin  (,VITAMIN B-12,) 1000 MCG/ML injection [id number]  Inject 100 mcg into the muscle every 30 (thirty) days. [provider]  Active Other, Pharmacy Records, Multiple Informants, Care Giver, Self           Med Note RENNIS, DUROJAHYE' R   Mon Mar 13, 2024 10:32 PM)    desvenlafaxine (PRISTIQ) 25 MG 24 hr tablet [id number]  Take 25 mg by mouth every morning. [provider]  Active Other, Pharmacy Records, Multiple Informants, Care Giver, Self  diclofenac  Sodium (VOLTAREN ) 1 % GEL [id number]  Apply 4 g topically 4 (four) times daily as needed (For pain). [provider]  Active Other, Pharmacy Records, Multiple Informants, Care Giver, Self  docusate sodium  (COLACE) 50 MG capsule [id number]  Take 2 capsules (100 mg total) by mouth daily as needed for mild constipation. Amin, Ankit C, MD  Active   ELIQUIS  5 MG TABS tablet [id number]  Take 5 mg by mouth 2 (  two) times daily. [provider]  Active Other, Pharmacy Records, Multiple Informants, Care Giver, Self  esomeprazole (NEXIUM) 40 MG capsule [id number]  Take 40 mg by mouth daily. [provider]  Active Other, Pharmacy Records, Multiple Informants, Care Giver, Self  FEROSUL 325 (65 Fe) MG tablet [id number]  Take 325 mg by mouth every other day. [provider]  Active Other,  Pharmacy Records, Multiple Informants, Care Giver, Self  HYDROmorphone  (DILAUDID ) 2 MG tablet [id number]  Take 1 tablet (2 mg total) by mouth in the morning and at bedtime. Amin, Ankit C, MD  Active   hydrOXYzine  (VISTARIL ) 25 MG capsule [id number]  Take 25 mg by mouth at bedtime. [provider]  Active Other, Pharmacy Records, Multiple Informants, Care Giver, Self           Med Note (SATTERFIELD, TEENA BRAVO   Fri Dec 24, 2023  3:41 PM) Daughter verified patient is taking this medication   lidocaine  (XYLOCAINE ) 2 % solution [id number]  Use as directed 15 mLs in the mouth or throat 2 (two) times daily as needed for mouth pain. [provider]  Active Other, Pharmacy Records, Multiple Informants, Care Giver, Self  losartan  (COZAAR ) 100 MG tablet [id number]  Take 100 mg by mouth daily. [provider]  Active Other, Pharmacy Records, Multiple Informants, Care Giver, Self  metoCLOPramide  (REGLAN ) 10 MG tablet [id number]  Take 1 tablet (10 mg total) by mouth every 8 (eight) hours as needed for nausea. Tegeler, Lonni PARAS, MD  Active   metoCLOPramide  (REGLAN ) 5 MG tablet [id number]  Take 1 tablet (5 mg total) by mouth 3 (three) times daily before meals.  Patient taking differently: Take 5 mg by mouth every 8 (eight) hours as needed for vomiting, refractory nausea / vomiting or nausea.   Shalhoub, Zachary PARAS, MD  Active Other, Pharmacy Records, Multiple Informants, Care Giver, Self  ondansetron  (ZOFRAN ) 8 MG tablet [id number]  Take 8 mg by mouth every 8 (eight) hours as needed for nausea or vomiting. [provider]  Active Other, Pharmacy Records, Multiple Informants, Care Giver, Self  prochlorperazine (COMPAZINE) 5 MG tablet [id number]  Take 5 mg by mouth every 6 (six) hours as needed for vomiting or nausea. [provider]  Active Other, Pharmacy Records, Multiple Informants, Care Giver, Self  sucralfate  (CARAFATE ) 1 g tablet [id number]  Take 1 g by mouth 2 (two) times  daily. [provider]  Active Other, Pharmacy Records, Multiple Informants, Care Giver, Self  Syringe/Needle, Disp, (SYRINGE 3CC/25GX1-1/2) 25G X 1-1/2 3 ML MISC [id number]  1 each by Does not apply route every 30 (thirty) days. For B12 injection [provider]  Active Other, Pharmacy Records, Multiple Informants, Care Giver, Self  Med List Note Steffi Nian, CPhT 03/14/24 1304): [facility]             Recommendation:   Continue Current Plan of Care  Follow Up Plan:   Telephone follow-up in 1 month  Rolin Kerns, LCSW [facility]  [facility], [facility] Clinical Social Worker Direct Dial: [phone]  Fax: [phone] Website: [email or web address] 11:31 AM

## 2024-05-26 NOTE — Patient Instructions (Signed)
 Visit Information  Thank you for taking time to visit with me today. Please don't hesitate to contact me if I can be of assistance to you before our next scheduled appointment.  Your next care management appointment is by telephone on 09/05 at 11 AM  Please call the care guide team at [phone] if you need to cancel, schedule, or reschedule an appointment.   Please call the Suicide and Crisis Lifeline: 988 go to [facility] Urgent [facility] [address] [place] [phone]) call 911 if you are experiencing a Mental Health or Behavioral Health Crisis or need someone to talk to.  Rolin Kerns, LCSW [place]  [facility], [facility] Clinical Social Worker Direct Dial: [phone]  Fax: [phone] Website: [email or web address] 11:31 AM

## 2024-06-28 DIAGNOSIS — H16232 Neurotrophic keratoconjunctivitis, left eye: Secondary | ICD-10-CM | POA: Diagnosis not present

## 2024-06-30 ENCOUNTER — Other Ambulatory Visit: Payer: Self-pay | Admitting: Licensed Clinical Social Worker

## 2024-06-30 NOTE — Patient Outreach (Signed)
 Complex Care Management   Visit Note  06/30/2024  Name:  Kristen [name] MRN: [id number] DOB: May 04, 1933  Situation: Referral received for Complex Care Management related to Mental/Behavioral Health diagnosis Anxiety/Caregiver Stress I obtained verbal consent from Caregiver.  Visit completed with Caregiver  on the phone  Background:   Past Medical History:  Diagnosis Date   Anemia    Arthritis    B12 deficiency    Chronic kidney disease    Chronic pain disorder    trigeminal neuralgia   Depression    Dyslipidemia    Hypertension    PONV (postoperative nausea and vomiting)    Seizure disorder (HCC)    decades since last seizure   Tremor    Trigeminal nerve disorder     Assessment: Patient Reported Symptoms:  Cognitive Cognitive Status: Normal speech and language skills, No symptoms reported Cognitive/Intellectual Conditions Management [RPT]: None reported or documented in medical history or problem list   Health Maintenance Behaviors: Annual physical exam  Neurological Neurological Review of Symptoms: Not assessed    HEENT HEENT Symptoms Reported: Eye dryness HEENT Management Strategies: Medication therapy, Routine screening HEENT Comment: Pt has to have eye drops administered each hour and lubrication drops administered at night    Cardiovascular Cardiovascular Symptoms Reported: Not assessed    Respiratory Respiratory Symptoms Reported: Not assesed    Endocrine Endocrine Symptoms Reported: No symptoms reported Is patient diabetic?: No    Gastrointestinal Gastrointestinal Symptoms Reported: Not assessed      Genitourinary Genitourinary Symptoms Reported: No symptoms reported    Integumentary Integumentary Symptoms Reported: Not assessed    Musculoskeletal Musculoskelatal Symptoms Reviewed: Weakness Musculoskeletal Management Strategies: Routine screening, Adequate rest, Coping strategies      Psychosocial Psychosocial Symptoms Reported: Other Other Psychosocial  Conditions: Caregiver Stress Additional Psychological Details: Caregiver has aid to assist pt, who has returned to independent residence Behavioral Management Strategies: Adequate rest, Support system, Coping strategies, Medication therapy Major Change/Loss/Stressor/Fears (CP): Medical condition, self      06/30/2024    PHQ2-9 Depression Screening   Little interest or pleasure in doing things    Feeling down, depressed, or hopeless    PHQ-2 - Total Score    Trouble falling or staying asleep, or sleeping too much    Feeling tired or having little energy    Poor appetite or overeating     Feeling bad about yourself - or that you are a failure or have let yourself or your family down    Trouble concentrating on things, such as reading the newspaper or watching television    Moving or speaking so slowly that other people could have noticed.  Or the opposite - being so fidgety or restless that you have been moving around a lot more than usual    Thoughts that you would be better off dead, or hurting yourself in some way    PHQ2-9 Total Score    If you checked off any problems, how difficult have these problems made it for you to do your work, take care of things at home, or get along with other people    Depression Interventions/Treatment      There were no vitals filed for this visit.  Medications Reviewed Today     Reviewed by Ezzard Rolin BIRCH, LCSW (Social Worker) on 06/30/24 at 1647  Med List Status: <None>   Medication Order Taking? Sig Documenting Provider Last Dose Status Informant  cyanocobalamin  (,VITAMIN B-12,) 1000 MCG/ML injection [id number]  Inject 100 mcg into  the muscle every 30 (thirty) days. [provider]  Active Other, Pharmacy Records, Multiple Informants, Care Giver, Self           Med Note RENNIS, DUROJAHYE' R   Mon Mar 13, 2024 10:32 PM)    desvenlafaxine (PRISTIQ) 25 MG 24 hr tablet [id number]  Take 25 mg by mouth every morning. [provider]   Active Other, Pharmacy Records, Multiple Informants, Care Giver, Self  diclofenac  Sodium (VOLTAREN ) 1 % GEL [id number]  Apply 4 g topically 4 (four) times daily as needed (For pain). [provider]  Active Other, Pharmacy Records, Multiple Informants, Care Giver, Self  docusate sodium  (COLACE) 50 MG capsule [id number]  Take 2 capsules (100 mg total) by mouth daily as needed for mild constipation. Caleen Burgess BROCKS, MD  Active   ELIQUIS  5 MG TABS tablet [id number]  Take 5 mg by mouth 2 (two) times daily. [provider]  Active Other, Pharmacy Records, Multiple Informants, Care Giver, Self  esomeprazole (NEXIUM) 40 MG capsule [id number]  Take 40 mg by mouth daily. [provider]  Active Other, Pharmacy Records, Multiple Informants, Care Giver, Self  FEROSUL 325 (65 Fe) MG tablet [id number]  Take 325 mg by mouth every other day. [provider]  Active Other, Pharmacy Records, Multiple Informants, Care Giver, Self  HYDROmorphone  (DILAUDID ) 2 MG tablet [id number]  Take 1 tablet (2 mg total) by mouth in the morning and at bedtime. Caleen Burgess BROCKS, MD  Active   hydrOXYzine  (VISTARIL ) 25 MG capsule [id number]  Take 25 mg by mouth at bedtime. [provider]  Active Other, Pharmacy Records, Multiple Informants, Care Giver, Self           Med Note (SATTERFIELD, TEENA BRAVO   Fri Dec 24, 2023  3:41 PM) Daughter verified patient is taking this medication   lidocaine  (XYLOCAINE ) 2 % solution [id number]  Use as directed 15 mLs in the mouth or throat 2 (two) times daily as needed for mouth pain. [provider]  Active Other, Pharmacy Records, Multiple Informants, Care Giver, Self  losartan  (COZAAR ) 100 MG tablet [id number]  Take 100 mg by mouth daily. [provider]  Active Other, Pharmacy Records, Multiple Informants, Care Giver, Self  metoCLOPramide  (REGLAN ) 10 MG tablet [id number]  Take 1 tablet (10 mg total) by mouth every 8 (eight) hours as needed for nausea.  Tegeler, Lonni PARAS, MD  Active   metoCLOPramide  (REGLAN ) 5 MG tablet [id number]  Take 1 tablet (5 mg total) by mouth 3 (three) times daily before meals.  Patient taking differently: Take 5 mg by mouth every 8 (eight) hours as needed for vomiting, refractory nausea / vomiting or nausea.   Shalhoub, Zachary PARAS, MD  Active Other, Pharmacy Records, Multiple Informants, Care Giver, Self  ondansetron  (ZOFRAN ) 8 MG tablet [id number]  Take 8 mg by mouth every 8 (eight) hours as needed for nausea or vomiting. [provider]  Active Other, Pharmacy Records, Multiple Informants, Care Giver, Self  prochlorperazine (COMPAZINE) 5 MG tablet [id number]  Take 5 mg by mouth every 6 (six) hours as needed for vomiting or nausea. [provider]  Active Other, Pharmacy Records, Multiple Informants, Care Giver, Self  sucralfate  (CARAFATE ) 1 g tablet [id number]  Take 1 g by mouth 2 (two) times daily. [provider]  Active Other, Pharmacy Records, Multiple Informants, Care Giver, Self  Syringe/Needle, Disp, (SYRINGE 3CC/25GX1-1/2) 25G X 1-1/2 3 ML MISC [id number]  1 each by Does not apply route every 30 (thirty)  days. For B12 injection [provider]  Active Other, Pharmacy Records, Multiple Informants, Care Giver, Self  Med List Note Steffi Nian, CPhT 03/14/24 1304): [facility]             Recommendation:   Continue Current Plan of Care  Follow Up Plan:   Telephone follow-up in 1 month  Rolin Kerns, LCSW [facility]  [facility], [facility] Clinical Social Worker Direct Dial: [phone]  Fax: [phone] Website: [email or web address] 4:54 PM

## 2024-06-30 NOTE — Patient Instructions (Signed)
 Visit Information  Thank you for taking time to visit with me today. Please don't hesitate to contact me if I can be of assistance to you before our next scheduled appointment.  Your next care management appointment is by telephone on 10/10 at 11 AM  Please call the care guide team at [phone] if you need to cancel, schedule, or reschedule an appointment.   Please call the Suicide and Crisis Lifeline: 988 go to [facility] Urgent [facility] [address], [place] [phone]) call 911 if you are experiencing a Mental Health or Behavioral Health Crisis or need someone to talk to.  Rolin Kerns, LCSW [place]  [facility], [facility] Clinical Social Worker Direct Dial: [phone]  Fax: [phone] Website: [email or web address] 4:54 PM

## 2024-07-03 DIAGNOSIS — F4321 Adjustment disorder with depressed mood: Secondary | ICD-10-CM | POA: Diagnosis not present

## 2024-07-06 DIAGNOSIS — H04122 Dry eye syndrome of left lacrimal gland: Secondary | ICD-10-CM | POA: Diagnosis not present

## 2024-07-06 DIAGNOSIS — H16232 Neurotrophic keratoconjunctivitis, left eye: Secondary | ICD-10-CM | POA: Diagnosis not present

## 2024-07-20 DIAGNOSIS — H16232 Neurotrophic keratoconjunctivitis, left eye: Secondary | ICD-10-CM | POA: Diagnosis not present

## 2024-07-20 DIAGNOSIS — Z87820 Personal history of traumatic brain injury: Secondary | ICD-10-CM | POA: Diagnosis not present

## 2024-07-20 DIAGNOSIS — Z9689 Presence of other specified functional implants: Secondary | ICD-10-CM | POA: Diagnosis not present

## 2024-07-20 DIAGNOSIS — G500 Trigeminal neuralgia: Secondary | ICD-10-CM | POA: Diagnosis not present

## 2024-08-03 DIAGNOSIS — J3489 Other specified disorders of nose and nasal sinuses: Secondary | ICD-10-CM | POA: Diagnosis not present

## 2024-08-03 DIAGNOSIS — Z03818 Encounter for observation for suspected exposure to other biological agents ruled out: Secondary | ICD-10-CM | POA: Diagnosis not present

## 2024-08-03 DIAGNOSIS — R6889 Other general symptoms and signs: Secondary | ICD-10-CM | POA: Diagnosis not present

## 2024-08-04 ENCOUNTER — Other Ambulatory Visit: Payer: Self-pay | Admitting: Licensed Clinical Social Worker

## 2024-08-04 NOTE — Patient Outreach (Signed)
 Complex Care Management   Visit Note  08/04/2024  Name:  Kristen [name] MRN: [id number] DOB: [birth year]  Situation: Referral received for Complex Care Management related to Mental/Behavioral Health diagnosis Anxiety and Caregiver Stress I obtained verbal consent from Caregiver.  Visit completed with Caregiver  on the phone  Background:   Past Medical History:  Diagnosis Date   Anemia    Arthritis    B12 deficiency    Chronic kidney disease    Chronic pain disorder    trigeminal neuralgia   Depression    Dyslipidemia    Hypertension    PONV (postoperative nausea and vomiting)    Seizure disorder (HCC)    decades since last seizure   Tremor    Trigeminal nerve disorder     Assessment: Patient Reported Symptoms:  Cognitive Cognitive Status: Normal speech and language skills, No symptoms reported Cognitive/Intellectual Conditions Management [RPT]: None reported or documented in medical history or problem list   Health Maintenance Behaviors: Annual physical exam  Neurological Neurological Review of Symptoms: Not assessed    HEENT HEENT Symptoms Reported: Not assessed      Cardiovascular Cardiovascular Symptoms Reported: No symptoms reported    Respiratory Respiratory Symptoms Reported: No symptoms reported    Endocrine Endocrine Symptoms Reported: No symptoms reported    Gastrointestinal Gastrointestinal Symptoms Reported: Change in appetite      Genitourinary Genitourinary Symptoms Reported: Not assessed    Integumentary Integumentary Symptoms Reported: Not assessed    Musculoskeletal Musculoskelatal Symptoms Reviewed: Weakness Musculoskeletal Management Strategies: Coping strategies, Routine screening, Adequate rest      Psychosocial Psychosocial Symptoms Reported: Anxiety - if selected complete GAD Additional Psychological Details: Patient endorses fear of dying to Caregiver weekly. Particpates in med management, not interested in socializing. Behavioral  Management Strategies: Coping strategies, Support system Major Change/Loss/Stressor/Fears (CP): Medical condition, self Techniques to Cope with Loss/Stress/Change: Diversional activities      08/04/2024    PHQ2-9 Depression Screening   Little interest or pleasure in doing things    Feeling down, depressed, or hopeless    PHQ-2 - Total Score    Trouble falling or staying asleep, or sleeping too much    Feeling tired or having little energy    Poor appetite or overeating     Feeling bad about yourself - or that you are a failure or have let yourself or your family down    Trouble concentrating on things, such as reading the newspaper or watching television    Moving or speaking so slowly that other people could have noticed.  Or the opposite - being so fidgety or restless that you have been moving around a lot more than usual    Thoughts that you would be better off dead, or hurting yourself in some way    PHQ2-9 Total Score    If you checked off any problems, how difficult have these problems made it for you to do your work, take care of things at home, or get along with other people    Depression Interventions/Treatment      There were no vitals filed for this visit.  Medications Reviewed Today     Reviewed by Ezzard Rolin BIRCH, LCSW (Social Worker) on 08/04/24 at 1036  Med List Status: <None>   Medication Order Taking? Sig Documenting Provider Last Dose Status Informant  cyanocobalamin  (,VITAMIN B-12,) 1000 MCG/ML injection [id number]  Inject 100 mcg into the muscle every 30 (thirty) days. [provider]  Active Other, Pharmacy Records, Multiple  Informants, Care Giver, Self           Med Note RENNIS, DUROJAHYE' R   Mon Mar 13, 2024 10:32 PM)    desvenlafaxine (PRISTIQ) 25 MG 24 hr tablet [id number]  Take 25 mg by mouth every morning. [provider]  Active Other, Pharmacy Records, Multiple Informants, Care Giver, Self  diclofenac  Sodium (VOLTAREN ) 1 % GEL  [id number]  Apply 4 g topically 4 (four) times daily as needed (For pain). [provider]  Active Other, Pharmacy Records, Multiple Informants, Care Giver, Self  docusate sodium  (COLACE) 50 MG capsule [id number]  Take 2 capsules (100 mg total) by mouth daily as needed for mild constipation. Amin, Ankit C, MD  Active   ELIQUIS  5 MG TABS tablet [id number]  Take 5 mg by mouth 2 (two) times daily. [provider]  Active Other, Pharmacy Records, Multiple Informants, Care Giver, Self  esomeprazole (NEXIUM) 40 MG capsule [id number]  Take 40 mg by mouth daily. [provider]  Active Other, Pharmacy Records, Multiple Informants, Care Giver, Self  FEROSUL 325 (65 Fe) MG tablet [id number]  Take 325 mg by mouth every other day. [provider]  Active Other, Pharmacy Records, Multiple Informants, Care Giver, Self  HYDROmorphone  (DILAUDID ) 2 MG tablet [id number]  Take 1 tablet (2 mg total) by mouth in the morning and at bedtime. Amin, Ankit C, MD  Active   hydrOXYzine  (VISTARIL ) 25 MG capsule [id number]  Take 25 mg by mouth at bedtime. [provider]  Active Other, Pharmacy Records, Multiple Informants, Care Giver, Self           Med Note (SATTERFIELD, TEENA BRAVO   Fri Dec 24, 2023  3:41 PM) Daughter verified patient is taking this medication   lidocaine  (XYLOCAINE ) 2 % solution [id number]  Use as directed 15 mLs in the mouth or throat 2 (two) times daily as needed for mouth pain. [provider]  Active Other, Pharmacy Records, Multiple Informants, Care Giver, Self  losartan  (COZAAR ) 100 MG tablet [id number]  Take 100 mg by mouth daily. [provider]  Active Other, Pharmacy Records, Multiple Informants, Care Giver, Self  metoCLOPramide  (REGLAN ) 10 MG tablet [id number]  Take 1 tablet (10 mg total) by mouth every 8 (eight) hours as needed for nausea. Tegeler, Lonni PARAS, MD  Active   metoCLOPramide  (REGLAN ) 5 MG tablet [id number]  Take 1 tablet (5 mg total) by  mouth 3 (three) times daily before meals.  Patient taking differently: Take 5 mg by mouth every 8 (eight) hours as needed for vomiting, refractory nausea / vomiting or nausea.   Shalhoub, Zachary PARAS, MD  Active Other, Pharmacy Records, Multiple Informants, Care Giver, Self  ondansetron  (ZOFRAN ) 8 MG tablet [id number]  Take 8 mg by mouth every 8 (eight) hours as needed for nausea or vomiting. [provider]  Active Other, Pharmacy Records, Multiple Informants, Care Giver, Self  prochlorperazine (COMPAZINE) 5 MG tablet [id number]  Take 5 mg by mouth every 6 (six) hours as needed for vomiting or nausea. [provider]  Active Other, Pharmacy Records, Multiple Informants, Care Giver, Self  sucralfate  (CARAFATE ) 1 g tablet [id number]  Take 1 g by mouth 2 (two) times daily. [provider]  Active Other, Pharmacy Records, Multiple Informants, Care Giver, Self  Syringe/Needle, Disp, (SYRINGE 3CC/25GX1-1/2) 25G X 1-1/2 3 ML MISC [id number]  1 each by Does not apply route every 30 (thirty) days. For B12 injection [provider]  Active Other, Pharmacy Records, Multiple Informants, Care  Marko, Self  Med List Note Steffi Nian, CPhT 03/14/24 1304): [facility]             Recommendation:   PCP Follow-up  Follow Up Plan:   Telephone follow-up in 1 month  Rolin Kerns, LCSW [facility]  [facility], [facility] Clinical Social Worker Direct Dial: [phone]  Fax: [phone] Website: [email or web address] 12:17 PM

## 2024-08-04 NOTE — Patient Instructions (Signed)
 Visit Information  Thank you for taking time to visit with me today. Please don't hesitate to contact me if I can be of assistance to you before our next scheduled appointment.  Your next care management appointment is by telephone on 11/7 at 1 PM  Please call the care guide team at [phone] if you need to cancel, schedule, or reschedule an appointment.   Please call the Suicide and Crisis Lifeline: 988 go to [facility] [address] [place] [phone]) call 911 if you are experiencing a Mental Health or Behavioral Health Crisis or need someone to talk to.  Rolin Kerns, LCSW [place]  [facility], [facility] Clinical Social Worker Direct Dial: [phone]  Fax: [phone] Website: [email or web address] 12:18 PM

## 2024-08-11 DIAGNOSIS — R296 Repeated falls: Secondary | ICD-10-CM | POA: Diagnosis not present

## 2024-08-11 DIAGNOSIS — R54 Age-related physical debility: Secondary | ICD-10-CM | POA: Diagnosis not present

## 2024-08-11 DIAGNOSIS — I10 Essential (primary) hypertension: Secondary | ICD-10-CM | POA: Diagnosis not present

## 2024-08-11 DIAGNOSIS — R110 Nausea: Secondary | ICD-10-CM | POA: Diagnosis not present

## 2024-08-11 DIAGNOSIS — E871 Hypo-osmolality and hyponatremia: Secondary | ICD-10-CM | POA: Diagnosis not present

## 2024-08-11 DIAGNOSIS — R918 Other nonspecific abnormal finding of lung field: Secondary | ICD-10-CM | POA: Diagnosis not present

## 2024-08-11 DIAGNOSIS — R531 Weakness: Secondary | ICD-10-CM | POA: Diagnosis not present

## 2024-08-11 DIAGNOSIS — G509 Disorder of trigeminal nerve, unspecified: Secondary | ICD-10-CM | POA: Diagnosis not present

## 2024-08-11 DIAGNOSIS — Z86711 Personal history of pulmonary embolism: Secondary | ICD-10-CM | POA: Diagnosis not present

## 2024-08-17 DIAGNOSIS — H02205 Unspecified lagophthalmos left lower eyelid: Secondary | ICD-10-CM | POA: Diagnosis not present

## 2024-08-17 DIAGNOSIS — H16223 Keratoconjunctivitis sicca, not specified as Sjogren's, bilateral: Secondary | ICD-10-CM | POA: Diagnosis not present

## 2024-08-17 DIAGNOSIS — D3131 Benign neoplasm of right choroid: Secondary | ICD-10-CM | POA: Diagnosis not present

## 2024-08-17 DIAGNOSIS — H35033 Hypertensive retinopathy, bilateral: Secondary | ICD-10-CM | POA: Diagnosis not present

## 2024-08-17 DIAGNOSIS — I10 Essential (primary) hypertension: Secondary | ICD-10-CM | POA: Diagnosis not present

## 2024-08-17 DIAGNOSIS — H16232 Neurotrophic keratoconjunctivitis, left eye: Secondary | ICD-10-CM | POA: Diagnosis not present

## 2024-08-17 DIAGNOSIS — H02202 Unspecified lagophthalmos right lower eyelid: Secondary | ICD-10-CM | POA: Diagnosis not present

## 2024-08-17 DIAGNOSIS — Z961 Presence of intraocular lens: Secondary | ICD-10-CM | POA: Diagnosis not present

## 2024-08-21 DIAGNOSIS — I272 Pulmonary hypertension, unspecified: Secondary | ICD-10-CM | POA: Diagnosis not present

## 2024-08-21 DIAGNOSIS — E871 Hypo-osmolality and hyponatremia: Secondary | ICD-10-CM | POA: Diagnosis not present

## 2024-08-21 DIAGNOSIS — G4700 Insomnia, unspecified: Secondary | ICD-10-CM | POA: Diagnosis not present

## 2024-08-21 DIAGNOSIS — N1832 Chronic kidney disease, stage 3b: Secondary | ICD-10-CM | POA: Diagnosis not present

## 2024-08-21 DIAGNOSIS — G509 Disorder of trigeminal nerve, unspecified: Secondary | ICD-10-CM | POA: Diagnosis not present

## 2024-08-21 DIAGNOSIS — M159 Polyosteoarthritis, unspecified: Secondary | ICD-10-CM | POA: Diagnosis not present

## 2024-08-21 DIAGNOSIS — I1310 Hypertensive heart and chronic kidney disease without heart failure, with stage 1 through stage 4 chronic kidney disease, or unspecified chronic kidney disease: Secondary | ICD-10-CM | POA: Diagnosis not present

## 2024-08-21 DIAGNOSIS — M25561 Pain in right knee: Secondary | ICD-10-CM | POA: Diagnosis not present

## 2024-08-21 DIAGNOSIS — G8929 Other chronic pain: Secondary | ICD-10-CM | POA: Diagnosis not present

## 2024-08-22 DIAGNOSIS — H16233 Neurotrophic keratoconjunctivitis, bilateral: Secondary | ICD-10-CM | POA: Diagnosis not present

## 2024-08-22 DIAGNOSIS — H02205 Unspecified lagophthalmos left lower eyelid: Secondary | ICD-10-CM | POA: Diagnosis not present

## 2024-08-22 DIAGNOSIS — H02202 Unspecified lagophthalmos right lower eyelid: Secondary | ICD-10-CM | POA: Diagnosis not present

## 2024-08-22 DIAGNOSIS — H16223 Keratoconjunctivitis sicca, not specified as Sjogren's, bilateral: Secondary | ICD-10-CM | POA: Diagnosis not present

## 2024-08-23 DIAGNOSIS — M25561 Pain in right knee: Secondary | ICD-10-CM | POA: Diagnosis not present

## 2024-08-23 DIAGNOSIS — M159 Polyosteoarthritis, unspecified: Secondary | ICD-10-CM | POA: Diagnosis not present

## 2024-08-23 DIAGNOSIS — I272 Pulmonary hypertension, unspecified: Secondary | ICD-10-CM | POA: Diagnosis not present

## 2024-08-23 DIAGNOSIS — G509 Disorder of trigeminal nerve, unspecified: Secondary | ICD-10-CM | POA: Diagnosis not present

## 2024-08-23 DIAGNOSIS — G8929 Other chronic pain: Secondary | ICD-10-CM | POA: Diagnosis not present

## 2024-08-23 DIAGNOSIS — E871 Hypo-osmolality and hyponatremia: Secondary | ICD-10-CM | POA: Diagnosis not present

## 2024-08-23 DIAGNOSIS — I1310 Hypertensive heart and chronic kidney disease without heart failure, with stage 1 through stage 4 chronic kidney disease, or unspecified chronic kidney disease: Secondary | ICD-10-CM | POA: Diagnosis not present

## 2024-08-23 DIAGNOSIS — N1832 Chronic kidney disease, stage 3b: Secondary | ICD-10-CM | POA: Diagnosis not present

## 2024-08-30 DIAGNOSIS — M25561 Pain in right knee: Secondary | ICD-10-CM | POA: Diagnosis not present

## 2024-08-30 DIAGNOSIS — G8929 Other chronic pain: Secondary | ICD-10-CM | POA: Diagnosis not present

## 2024-08-30 DIAGNOSIS — N1832 Chronic kidney disease, stage 3b: Secondary | ICD-10-CM | POA: Diagnosis not present

## 2024-08-30 DIAGNOSIS — M159 Polyosteoarthritis, unspecified: Secondary | ICD-10-CM | POA: Diagnosis not present

## 2024-08-30 DIAGNOSIS — I1310 Hypertensive heart and chronic kidney disease without heart failure, with stage 1 through stage 4 chronic kidney disease, or unspecified chronic kidney disease: Secondary | ICD-10-CM | POA: Diagnosis not present

## 2024-08-30 DIAGNOSIS — G4700 Insomnia, unspecified: Secondary | ICD-10-CM | POA: Diagnosis not present

## 2024-08-30 DIAGNOSIS — E871 Hypo-osmolality and hyponatremia: Secondary | ICD-10-CM | POA: Diagnosis not present

## 2024-08-30 DIAGNOSIS — I272 Pulmonary hypertension, unspecified: Secondary | ICD-10-CM | POA: Diagnosis not present

## 2024-08-30 DIAGNOSIS — G509 Disorder of trigeminal nerve, unspecified: Secondary | ICD-10-CM | POA: Diagnosis not present

## 2024-09-01 ENCOUNTER — Other Ambulatory Visit: Payer: Self-pay | Admitting: Licensed Clinical Social Worker

## 2024-09-01 DIAGNOSIS — G4700 Insomnia, unspecified: Secondary | ICD-10-CM | POA: Diagnosis not present

## 2024-09-01 DIAGNOSIS — M25561 Pain in right knee: Secondary | ICD-10-CM | POA: Diagnosis not present

## 2024-09-01 DIAGNOSIS — G8929 Other chronic pain: Secondary | ICD-10-CM | POA: Diagnosis not present

## 2024-09-01 DIAGNOSIS — I272 Pulmonary hypertension, unspecified: Secondary | ICD-10-CM | POA: Diagnosis not present

## 2024-09-01 DIAGNOSIS — M159 Polyosteoarthritis, unspecified: Secondary | ICD-10-CM | POA: Diagnosis not present

## 2024-09-01 DIAGNOSIS — G509 Disorder of trigeminal nerve, unspecified: Secondary | ICD-10-CM | POA: Diagnosis not present

## 2024-09-01 DIAGNOSIS — N1832 Chronic kidney disease, stage 3b: Secondary | ICD-10-CM | POA: Diagnosis not present

## 2024-09-01 DIAGNOSIS — E871 Hypo-osmolality and hyponatremia: Secondary | ICD-10-CM | POA: Diagnosis not present

## 2024-09-01 DIAGNOSIS — I1310 Hypertensive heart and chronic kidney disease without heart failure, with stage 1 through stage 4 chronic kidney disease, or unspecified chronic kidney disease: Secondary | ICD-10-CM | POA: Diagnosis not present

## 2024-09-01 NOTE — Patient Outreach (Signed)
 Complex Care Management   Visit Note  09/01/2024  Name:  Kristen [name] MRN: [id number] DOB: [birth year]  Situation: Referral received for Complex Care Management related to Mental/Behavioral Health diagnosis Anxiety/Caregiver Stress I obtained verbal consent from Caregiver.  Visit completed with Caregiver  on the phone  Background:   Past Medical History:  Diagnosis Date   Anemia    Arthritis    B12 deficiency    Chronic kidney disease    Chronic pain disorder    trigeminal neuralgia   Depression    Dyslipidemia    Hypertension    PONV (postoperative nausea and vomiting)    Seizure disorder (HCC)    decades since last seizure   Tremor    Trigeminal nerve disorder     Assessment: Patient Reported Symptoms:  Cognitive Cognitive Status: No symptoms reported, Normal speech and language skills Cognitive/Intellectual Conditions Management [RPT]: None reported or documented in medical history or problem list   Health Maintenance Behaviors: Annual physical exam  Neurological Neurological Review of Symptoms: No symptoms reported    HEENT HEENT Symptoms Reported: Eye dryness (left) HEENT Management Strategies: Coping strategies, Medication therapy, Routine screening HEENT Comment: Pt visited eye specialist to assess left eye due to severe dryness. Pt will f/up with her on Mon, Nov 10    Cardiovascular Cardiovascular Symptoms Reported: No symptoms reported    Respiratory Respiratory Symptoms Reported: No symptoms reported    Endocrine Endocrine Symptoms Reported: No symptoms reported Is patient diabetic?: No    Gastrointestinal Gastrointestinal Symptoms Reported: Change in appetite      Genitourinary Genitourinary Symptoms Reported: No symptoms reported    Integumentary Integumentary Symptoms Reported: No symptoms reported    Musculoskeletal Musculoskelatal Symptoms Reviewed: Weakness        Psychosocial Psychosocial Symptoms Reported: No symptoms reported Other  Psychosocial Conditions: Caregiver Stress Additional Psychological Details: Anxiety symptoms have not worsened. Caregiver and LCSW discussed strategies to assist with medication compliance, noting PCP is agreeable to assist. Behavioral Management Strategies: Coping strategies, Support system Major Change/Loss/Stressor/Fears (CP): Medical condition, self Techniques to Cope with Loss/Stress/Change: Diversional activities      09/01/2024    PHQ2-9 Depression Screening   Little interest or pleasure in doing things    Feeling down, depressed, or hopeless    PHQ-2 - Total Score    Trouble falling or staying asleep, or sleeping too much    Feeling tired or having little energy    Poor appetite or overeating     Feeling bad about yourself - or that you are a failure or have let yourself or your family down    Trouble concentrating on things, such as reading the newspaper or watching television    Moving or speaking so slowly that other people could have noticed.  Or the opposite - being so fidgety or restless that you have been moving around a lot more than usual    Thoughts that you would be better off dead, or hurting yourself in some way    PHQ2-9 Total Score    If you checked off any problems, how difficult have these problems made it for you to do your work, take care of things at home, or get along with other people    Depression Interventions/Treatment      There were no vitals filed for this visit.  Medications Reviewed Today     Reviewed by Ezzard Rolin BIRCH, LCSW (Social Worker) on 09/01/24 at 1306  Med List Status: <None>   Medication Order  Taking? Sig Documenting Provider Last Dose Status Informant  cyanocobalamin  (,VITAMIN B-12,) 1000 MCG/ML injection [id number]  Inject 100 mcg into the muscle every 30 (thirty) days. [provider]  Active Other, Pharmacy Records, Multiple Informants, Care Giver, Self           Med Note RENNIS, DUROJAHYE' R   Mon Mar 13, 2024 10:32  PM)    desvenlafaxine (PRISTIQ) 25 MG 24 hr tablet [id number]  Take 25 mg by mouth every morning. [provider]  Active Other, Pharmacy Records, Multiple Informants, Care Giver, Self  diclofenac  Sodium (VOLTAREN ) 1 % GEL [id number]  Apply 4 g topically 4 (four) times daily as needed (For pain). [provider]  Active Other, Pharmacy Records, Multiple Informants, Care Giver, Self  docusate sodium  (COLACE) 50 MG capsule [id number]  Take 2 capsules (100 mg total) by mouth daily as needed for mild constipation. Amin, Ankit C, MD  Active   ELIQUIS  5 MG TABS tablet [id number]  Take 5 mg by mouth 2 (two) times daily. [provider]  Active Other, Pharmacy Records, Multiple Informants, Care Giver, Self  esomeprazole (NEXIUM) 40 MG capsule [id number]  Take 40 mg by mouth daily. [provider]  Active Other, Pharmacy Records, Multiple Informants, Care Giver, Self  FEROSUL 325 (65 Fe) MG tablet [id number]  Take 325 mg by mouth every other day. [provider]  Active Other, Pharmacy Records, Multiple Informants, Care Giver, Self  HYDROmorphone  (DILAUDID ) 2 MG tablet [id number]  Take 1 tablet (2 mg total) by mouth in the morning and at bedtime. Amin, Ankit C, MD  Active   hydrOXYzine  (VISTARIL ) 25 MG capsule [id number]  Take 25 mg by mouth at bedtime. [provider]  Active Other, Pharmacy Records, Multiple Informants, Care Giver, Self           Med Note (SATTERFIELD, TEENA BRAVO   Fri Dec 24, 2023  3:41 PM) Daughter verified patient is taking this medication   lidocaine  (XYLOCAINE ) 2 % solution [id number]  Use as directed 15 mLs in the mouth or throat 2 (two) times daily as needed for mouth pain. [provider]  Active Other, Pharmacy Records, Multiple Informants, Care Giver, Self  losartan  (COZAAR ) 100 MG tablet [id number]  Take 100 mg by mouth daily. [provider]  Active Other, Pharmacy Records, Multiple Informants, Care Giver, Self   metoCLOPramide  (REGLAN ) 10 MG tablet [id number]  Take 1 tablet (10 mg total) by mouth every 8 (eight) hours as needed for nausea. Tegeler, Lonni PARAS, MD  Active   metoCLOPramide  (REGLAN ) 5 MG tablet [id number]  Take 1 tablet (5 mg total) by mouth 3 (three) times daily before meals.  Patient taking differently: Take 5 mg by mouth every 8 (eight) hours as needed for vomiting, refractory nausea / vomiting or nausea.   Shalhoub, Zachary PARAS, MD  Active Other, Pharmacy Records, Multiple Informants, Care Giver, Self  ondansetron  (ZOFRAN ) 8 MG tablet [id number]  Take 8 mg by mouth every 8 (eight) hours as needed for nausea or vomiting. [provider]  Active Other, Pharmacy Records, Multiple Informants, Care Giver, Self  prochlorperazine (COMPAZINE) 5 MG tablet [id number]  Take 5 mg by mouth every 6 (six) hours as needed for vomiting or nausea. [provider]  Active Other, Pharmacy Records, Multiple Informants, Care Giver, Self  sucralfate  (CARAFATE ) 1 g tablet [id number]  Take 1 g by mouth 2 (two) times daily. [provider]  Active Other, Pharmacy Records, Multiple Informants, Care Giver, Self  Syringe/Needle,  Disp, (SYRINGE 3CC/25GX1-1/2) 25G X 1-1/2 3 ML MISC [id number]  1 each by Does not apply route every 30 (thirty) days. For B12 injection [provider]  Active Other, Pharmacy Records, Multiple Informants, Care Giver, Self  Med List Note Steffi Nian, CPhT 03/14/24 1304): [facility]             Recommendation:   Continue Current Plan of Care  Follow Up Plan:   Telephone follow-up in 1 month  Rolin Kerns, LCSW [facility]  [facility], [facility] Clinical Social Worker Direct Dial: [phone]  Fax: [phone] Website: [email or web address] 1:24 PM

## 2024-09-01 NOTE — Patient Instructions (Signed)
 Visit Information  Thank you for taking time to visit with me today. Please don't hesitate to contact me if I can be of assistance to you before our next scheduled appointment.  Your next care management appointment is by telephone on 12/12 at 1 PM  Please call the care guide team at [phone] if you need to cancel, schedule, or reschedule an appointment.   Please call the Suicide and Crisis Lifeline: 988 go to [facility] Urgent [facility] [address] [place] [phone]) call 911 if you are experiencing a Mental Health or Behavioral Health Crisis or need someone to talk to.  Rolin Kerns, LCSW [place]  [facility], [facility] Clinical Social Worker Direct Dial: [phone]  Fax: [phone] Website: [email or web address] 1:24 PM

## 2024-09-04 DIAGNOSIS — H02202 Unspecified lagophthalmos right lower eyelid: Secondary | ICD-10-CM | POA: Diagnosis not present

## 2024-09-04 DIAGNOSIS — H16223 Keratoconjunctivitis sicca, not specified as Sjogren's, bilateral: Secondary | ICD-10-CM | POA: Diagnosis not present

## 2024-09-04 DIAGNOSIS — H02205 Unspecified lagophthalmos left lower eyelid: Secondary | ICD-10-CM | POA: Diagnosis not present

## 2024-09-04 DIAGNOSIS — H16232 Neurotrophic keratoconjunctivitis, left eye: Secondary | ICD-10-CM | POA: Diagnosis not present

## 2024-10-06 ENCOUNTER — Other Ambulatory Visit: Payer: Self-pay | Admitting: Licensed Clinical Social Worker

## 2024-10-06 NOTE — Patient Instructions (Signed)
 Visit Information  Thank you for taking time to visit with me today. Please don't hesitate to contact me if I can be of assistance to you before our next scheduled appointment.  Your next care management appointment is by telephone on 11/03/24 at 3:30 PM  Please call the care guide team at [phone] if you need to cancel, schedule, or reschedule an appointment.   Please call the Suicide and Crisis Lifeline: 988 go to [facility] [address] [place] [phone]) call 911 if you are experiencing a Mental Health or Behavioral Health Crisis or need someone to talk to.  Rolin Kerns, LCSW [place]  [facility], [facility] Clinical Social Worker Direct Dial: [phone]  Fax: [phone] Website: [email or web address] 2:47 PM

## 2024-10-06 NOTE — Patient Outreach (Signed)
 Complex Care Management   Visit Note  10/06/2024  Name:  Kristen [name] MRN: [id number] DOB: 1933/01/21  Situation: Referral received for Complex Care Management related to Mental/Behavioral Health diagnosis Anxiety and Caregiver Stress I obtained verbal consent from Caregiver.  Visit completed with Caregiver  on the phone  Background:   Past Medical History:  Diagnosis Date   Anemia    Arthritis    B12 deficiency    Chronic kidney disease    Chronic pain disorder    trigeminal neuralgia   Depression    Dyslipidemia    Hypertension    PONV (postoperative nausea and vomiting)    Seizure disorder (HCC)    decades since last seizure   Tremor    Trigeminal nerve disorder     Assessment: Patient Reported Symptoms:  Cognitive Cognitive Status: No symptoms reported Cognitive/Intellectual Conditions Management [RPT]: None reported or documented in medical history or problem list   Health Maintenance Behaviors: Annual physical exam  Neurological Neurological Review of Symptoms: No symptoms reported    HEENT HEENT Symptoms Reported: Not assessed      Cardiovascular Cardiovascular Symptoms Reported: No symptoms reported    Respiratory Respiratory Symptoms Reported: No symptoms reported    Endocrine Endocrine Symptoms Reported: No symptoms reported    Gastrointestinal Gastrointestinal Symptoms Reported: No symptoms reported Additional Gastrointestinal Details: Pt has discontinued iron pill for two weeks, and endorsed no nausea as a result      Genitourinary Genitourinary Symptoms Reported: Not assessed    Integumentary Integumentary Symptoms Reported: No symptoms reported    Musculoskeletal Musculoskelatal Symptoms Reviewed: Weakness Musculoskeletal Management Strategies: Medication therapy, Coping strategies, Adequate rest, Routine screening   Fall risk Follow up: Falls prevention discussed (ILF doesn't have rails. Strategies discussed to promote safety while patient  ambulates around facility)  Psychosocial Psychosocial Symptoms Reported: Other Other Psychosocial Conditions: Caregiver Stress Behavioral Management Strategies: Coping strategies, Support system, Community resources Major Change/Loss/Stressor/Fears (CP): Medical condition, self, Environment Techniques to [employer] with Loss/Stress/Change: Diversional activities      10/06/2024    PHQ2-9 Depression Screening   Little interest or pleasure in doing things    Feeling down, depressed, or hopeless    PHQ-2 - Total Score    Trouble falling or staying asleep, or sleeping too much    Feeling tired or having little energy    Poor appetite or overeating     Feeling bad about yourself - or that you are a failure or have let yourself or your family down    Trouble concentrating on things, such as reading the newspaper or watching television    Moving or speaking so slowly that other people could have noticed.  Or the opposite - being so fidgety or restless that you have been moving around a lot more than usual    Thoughts that you would be better off dead, or hurting yourself in some way    PHQ2-9 Total Score    If you checked off any problems, how difficult have these problems made it for you to do your work, take care of things at home, or get along with other people    Depression Interventions/Treatment      There were no vitals filed for this visit.    Medications Reviewed Today     Reviewed by Annelies Coyt D, LCSW (Social Worker) on 10/06/24 at 1433  Med List Status: <None>   Medication Order Taking? Sig Documenting Provider Last Dose Status Informant  cyanocobalamin  (,VITAMIN B-12,) 1000 MCG/ML  injection [id number]  Inject 100 mcg into the muscle every 30 (thirty) days. [provider]  Active Other, Pharmacy Records, Multiple Informants, Care Giver, Self           Med Note RENNIS, DUROJAHYE' R   Mon Mar 13, 2024 10:32 PM)    desvenlafaxine (PRISTIQ) 25 MG 24 hr tablet [id number]   Take 25 mg by mouth every morning. [provider]  Active Other, Pharmacy Records, Multiple Informants, Care Giver, Self  diclofenac  Sodium (VOLTAREN ) 1 % GEL [id number]  Apply 4 g topically 4 (four) times daily as needed (For pain). [provider]  Active Other, Pharmacy Records, Multiple Informants, Care Giver, Self  docusate sodium  (COLACE) 50 MG capsule [id number]  Take 2 capsules (100 mg total) by mouth daily as needed for mild constipation. Caleen Burgess BROCKS, MD  Active   ELIQUIS  5 MG TABS tablet [id number]  Take 5 mg by mouth 2 (two) times daily. [provider]  Active Other, Pharmacy Records, Multiple Informants, Care Giver, Self  esomeprazole (NEXIUM) 40 MG capsule [id number]  Take 40 mg by mouth daily. [provider]  Active Other, Pharmacy Records, Multiple Informants, Care Giver, Self  FEROSUL 325 (65 Fe) MG tablet [id number]  Take 325 mg by mouth every other day. [provider]  Active Other, Pharmacy Records, Multiple Informants, Care Giver, Self  HYDROmorphone  (DILAUDID ) 2 MG tablet [id number]  Take 1 tablet (2 mg total) by mouth in the morning and at bedtime. Caleen Burgess BROCKS, MD  Active   hydrOXYzine  (VISTARIL ) 25 MG capsule [id number]  Take 25 mg by mouth at bedtime. [provider]  Active Other, Pharmacy Records, Multiple Informants, Care Giver, Self           Med Note (SATTERFIELD, TEENA BRAVO   Fri Dec 24, 2023  3:41 PM) Daughter verified patient is taking this medication   lidocaine  (XYLOCAINE ) 2 % solution [id number]  Use as directed 15 mLs in the mouth or throat 2 (two) times daily as needed for mouth pain. [provider]  Active Other, Pharmacy Records, Multiple Informants, Care Giver, Self  losartan  (COZAAR ) 100 MG tablet [id number]  Take 100 mg by mouth daily. [provider]  Active Other, Pharmacy Records, Multiple Informants, Care Giver, Self  metoCLOPramide  (REGLAN ) 10 MG tablet [id number]  Take 1 tablet (10 mg  total) by mouth every 8 (eight) hours as needed for nausea. Tegeler, Lonni PARAS, MD  Active   metoCLOPramide  (REGLAN ) 5 MG tablet [id number]  Take 1 tablet (5 mg total) by mouth 3 (three) times daily before meals.  Patient taking differently: Take 5 mg by mouth every 8 (eight) hours as needed for vomiting, refractory nausea / vomiting or nausea.   Shalhoub, Zachary PARAS, MD  Active Other, Pharmacy Records, Multiple Informants, Care Giver, Self  ondansetron  (ZOFRAN ) 8 MG tablet [id number]  Take 8 mg by mouth every 8 (eight) hours as needed for nausea or vomiting. [provider]  Active Other, Pharmacy Records, Multiple Informants, Care Giver, Self  prochlorperazine (COMPAZINE) 5 MG tablet [id number]  Take 5 mg by mouth every 6 (six) hours as needed for vomiting or nausea. [provider]  Active Other, Pharmacy Records, Multiple Informants, Care Giver, Self  sucralfate  (CARAFATE ) 1 g tablet [id number]  Take 1 g by mouth 2 (two) times daily. [provider]  Active Other, Pharmacy Records, Multiple Informants, Care Giver, Self  Syringe/Needle, Disp, (SYRINGE 3CC/25GX1-1/2) 25G X 1-1/2 3 ML MISC [id number]  1 each by  Does not apply route every 30 (thirty) days. For B12 injection [provider]  Active Other, Pharmacy Records, Multiple Informants, Care Giver, Self  Med List Note Steffi Nian, CPhT 03/14/24 1304): [facility]             Recommendation:   Continue Current Plan of Care  Follow Up Plan:   Telephone follow-up in 1 month  Rolin Kerns, LCSW [facility]  [facility], [facility] Clinical Social Worker Direct Dial: [phone]  Fax: [phone] Website: [email or web address] 2:46 PM

## 2024-10-23 ENCOUNTER — Encounter (HOSPITAL_COMMUNITY): Payer: Self-pay

## 2024-10-23 ENCOUNTER — Emergency Department (HOSPITAL_COMMUNITY)
Admission: EM | Admit: 2024-10-23 | Discharge: 2024-10-23 | Disposition: A | Discharge status: Home / Self Care | Source: Ambulatory Visit

## 2024-10-23 ENCOUNTER — Other Ambulatory Visit: Payer: Self-pay

## 2024-10-23 DIAGNOSIS — N3000 Acute cystitis without hematuria: Secondary | ICD-10-CM | POA: Diagnosis not present

## 2024-10-23 DIAGNOSIS — Z79899 Other long term (current) drug therapy: Secondary | ICD-10-CM | POA: Diagnosis not present

## 2024-10-23 DIAGNOSIS — Z7901 Long term (current) use of anticoagulants: Secondary | ICD-10-CM | POA: Diagnosis not present

## 2024-10-23 DIAGNOSIS — R300 Dysuria: Secondary | ICD-10-CM | POA: Diagnosis present

## 2024-10-23 DIAGNOSIS — I10 Essential (primary) hypertension: Secondary | ICD-10-CM | POA: Diagnosis not present

## 2024-10-23 LAB — URINALYSIS, W/ REFLEX TO CULTURE (INFECTION SUSPECTED)
Bilirubin Urine: NEGATIVE
Glucose, UA: NEGATIVE mg/dL
Hgb urine dipstick: NEGATIVE
Ketones, ur: NEGATIVE mg/dL
Nitrite: POSITIVE — AB
Protein, ur: NEGATIVE mg/dL
Specific Gravity, Urine: 1.014 (ref 1.005–1.030)
WBC, UA: >50 WBC/hpf (ref 0–5)
pH: 6 (ref 5.0–8.0)

## 2024-10-23 LAB — CBC WITH DIFFERENTIAL/PLATELET
Abs Immature Granulocytes: 0.03 K/uL (ref 0.00–0.07)
Basophils Absolute: 0 K/uL (ref 0.0–0.1)
Basophils Relative: 1 %
Eosinophils Absolute: 0.1 K/uL (ref 0.0–0.5)
Eosinophils Relative: 1 %
HCT: 37.3 % (ref 36.0–46.0)
Hemoglobin: 12.5 g/dL (ref 12.0–15.0)
Immature Granulocytes: 0 %
Lymphocytes Relative: 24 %
Lymphs Abs: 1.7 K/uL (ref 0.7–4.0)
MCH: 31.7 pg (ref 26.0–34.0)
MCHC: 33.5 g/dL (ref 30.0–36.0)
MCV: 94.7 fL (ref 80.0–100.0)
Monocytes Absolute: 0.5 K/uL (ref 0.1–1.0)
Monocytes Relative: 8 %
Neutro Abs: 4.7 K/uL (ref 1.7–7.7)
Neutrophils Relative %: 66 %
Platelets: 280 K/uL (ref 150–400)
RBC: 3.94 MIL/uL (ref 3.87–5.11)
RDW: 11.3 % — ABNORMAL LOW (ref 11.5–15.5)
WBC: 7.1 K/uL (ref 4.0–10.5)
nRBC: 0 % (ref 0.0–0.2)

## 2024-10-23 LAB — COMPREHENSIVE METABOLIC PANEL WITH GFR
ALT: 8 U/L (ref 0–44)
AST: 20 U/L (ref 15–41)
Albumin: 3.8 g/dL (ref 3.5–5.0)
Alkaline Phosphatase: 103 U/L (ref 38–126)
Anion gap: 12 (ref 5–15)
BUN: 16 mg/dL (ref 8–23)
CO2: 21 mmol/L — ABNORMAL LOW (ref 22–32)
Calcium: 8.5 mg/dL — ABNORMAL LOW (ref 8.9–10.3)
Chloride: 98 mmol/L (ref 98–111)
Creatinine, Ser: 0.95 mg/dL (ref 0.44–1.00)
GFR, Estimated: 57 mL/min — ABNORMAL LOW
Glucose, Bld: 96 mg/dL (ref 70–99)
Potassium: 4.1 mmol/L (ref 3.5–5.1)
Sodium: 131 mmol/L — ABNORMAL LOW (ref 135–145)
Total Bilirubin: 0.6 mg/dL (ref 0.0–1.2)
Total Protein: 6.3 g/dL — ABNORMAL LOW (ref 6.5–8.1)

## 2024-10-23 MED ORDER — IBUPROFEN 600 MG PO TABS: 600.0000 mg | ORAL_TABLET | Freq: Three times a day (TID) | ORAL | 0 refills | Status: AC | PRN

## 2024-10-23 MED ORDER — KETOROLAC TROMETHAMINE 15 MG/ML IJ SOLN
15.0000 mg | Freq: Once | INTRAMUSCULAR | Status: AC
  Administered 2024-10-23: 15 mg via INTRAMUSCULAR
  Filled 2024-10-23: qty 1

## 2024-10-23 MED ORDER — TRAMADOL HCL 50 MG PO TABS
50.0000 mg | ORAL_TABLET | Freq: Once | ORAL | Status: AC
  Administered 2024-10-23: 50 mg via ORAL
  Filled 2024-10-23: qty 1

## 2024-10-23 MED ORDER — ONDANSETRON 4 MG PO TBDP
4.0000 mg | ORAL_TABLET | Freq: Once | ORAL | Status: AC
  Administered 2024-10-23: 4 mg via ORAL
  Filled 2024-10-23: qty 1

## 2024-10-23 NOTE — Discharge Instructions (Addendum)
 Thank you for visiting the Emergency Department today. It was a pleasure to be part of your healthcare team.   Your were seen today for a UTI - as discussed, please take your antibiotic regimen as prescribed  It is important to watch for warning signs such as worsening pain or fever. If any of these happen, return to the Emergency Department or call 911  Thank you for trusting us  with your health.

## 2024-10-23 NOTE — ED Provider Notes (Signed)
 " [place] EMERGENCY DEPARTMENT AT [facility] Provider Note   CSN: [id number] Arrival date & time: 10/23/24  0848     Patient presents with: Dysuria   Kristen [name] is a 89 y.o. female Who presents with urinary symptoms x 1 week.  Patient states that she was prescribed Cipro 3 days ago and has not experienced any relief-she was seen this morning at her PCP and antibiotic was changed to Keflex.  Patient states that she has not started the new antibiotic treatment as she is experiencing increased pain so she came to the emergency department.  The patient reports dysuria, urinary frequency, urgency, and suprapubic discomfort.  Patient denies associated symptoms such as fever, chills, nausea, or vomiting. The patient denies gross hematuria, vaginal discharge or bleeding. Oral intake has been as normla and and pain control at home has been minimal with tylenol .  The patient is in no acute distress.    Dysuria      Prior to Admission medications  Medication Sig Start Date End Date Taking? Authorizing Provider  ibuprofen  (ADVIL ) 600 MG tablet Take 1 tablet (600 mg total) by mouth every 8 (eight) hours as needed for up to 7 days for mild pain (pain score 1-3). 10/23/24 10/30/24 Yes Noeh Sparacino L, PA  cyanocobalamin  (,VITAMIN B-12,) 1000 MCG/ML injection Inject 100 mcg into the muscle every 30 (thirty) days. 11/02/19   [provider]  desvenlafaxine (PRISTIQ) 25 MG 24 hr tablet Take 25 mg by mouth every morning. 02/29/24   [provider]  diclofenac  Sodium (VOLTAREN ) 1 % GEL Apply 4 g topically 4 (four) times daily as needed (For pain).    [provider]  docusate sodium  (COLACE) 50 MG capsule Take 2 capsules (100 mg total) by mouth daily as needed for mild constipation. 03/16/24   Amin, Ankit C, MD  [Paused] ELIQUIS  5 MG TABS tablet Take 5 mg by mouth 2 (two) times daily. Wait to take this until your doctor or other care provider tells you to start again. 09/28/22    [provider]  esomeprazole (NEXIUM) 40 MG capsule Take 40 mg by mouth daily.    [provider]  FEROSUL 325 (65 Fe) MG tablet Take 325 mg by mouth every other day. Patient not taking: Reported on 10/06/2024 02/29/24   [provider]  HYDROmorphone  (DILAUDID ) 2 MG tablet Take 1 tablet (2 mg total) by mouth in the morning and at bedtime. 03/16/24   Amin, Ankit C, MD  hydrOXYzine  (VISTARIL ) 25 MG capsule Take 25 mg by mouth at bedtime.    [provider]  lidocaine  (XYLOCAINE ) 2 % solution Use as directed 15 mLs in the mouth or throat 2 (two) times daily as needed for mouth pain. 03/10/24   [provider]  losartan  (COZAAR ) 100 MG tablet Take 100 mg by mouth daily. 02/29/24   [provider]  metoCLOPramide  (REGLAN ) 10 MG tablet Take 1 tablet (10 mg total) by mouth every 8 (eight) hours as needed for nausea. 05/13/24   Tegeler, Lonni PARAS, MD  metoCLOPramide  (REGLAN ) 5 MG tablet Take 1 tablet (5 mg total) by mouth 3 (three) times daily before meals. Patient taking differently: Take 5 mg by mouth every 8 (eight) hours as needed for vomiting, refractory nausea / vomiting or nausea. 12/30/23   Shalhoub, Zachary PARAS, MD  ondansetron  (ZOFRAN ) 8 MG tablet Take 8 mg by mouth every 8 (eight) hours as needed for nausea or vomiting.    [provider]  prochlorperazine (COMPAZINE) 5 MG tablet Take 5 mg by mouth every 6 (six) hours as needed for vomiting or nausea. 12/22/23   [provider]  sucralfate  (CARAFATE ) 1 g tablet Take 1 g by mouth 2 (two) times daily. 03/04/24   [provider]  Syringe/Needle, Disp, (SYRINGE 3CC/25GX1-1/2) 25G X 1-1/2 3 ML MISC 1 each by Does not apply route every 30 (thirty) days. For B12 injection    [provider]    Allergies: Penicillins, Tetracyclines & related, and Sulfa antibiotics    Review of Systems  Genitourinary:  Positive for dysuria.    Updated Vital Signs BP (!) 176/65 (BP  Location: Left Arm)   Pulse 60   Temp 97.7 F (36.5 C) (Oral)   Resp 18   Ht 5' 4 (1.626 m)   Wt 53.1 kg   LMP  (LMP Unknown)   SpO2 100%   BMI 20.08 kg/m   Physical Exam Vitals and nursing note reviewed.  Constitutional:      General: She is not in acute distress.    Appearance: Normal appearance.  HENT:     Head: Normocephalic and atraumatic.  Eyes:     Extraocular Movements: Extraocular movements intact.     Conjunctiva/sclera: Conjunctivae normal.     Pupils: Pupils are equal, round, and reactive to light.  Cardiovascular:     Rate and Rhythm: Normal rate and regular rhythm.     Pulses: Normal pulses.  Pulmonary:     Effort: Pulmonary effort is normal. No respiratory distress.  Abdominal:     General: Abdomen is flat. There is no distension.     Palpations: Abdomen is soft.     Tenderness: There is no abdominal tenderness. There is no right CVA tenderness, left CVA tenderness, guarding or rebound.  Musculoskeletal:        General: Normal range of motion.     Cervical back: Normal range of motion.  Skin:    General: Skin is warm and dry.     Capillary Refill: Capillary refill takes less than 2 seconds.  Neurological:     General: No focal deficit present.     Mental Status: She is alert. Mental status is at baseline.  Psychiatric:        Mood and Affect: Mood normal.     (all labs ordered are listed, but only abnormal results are displayed) Labs Reviewed  URINE CULTURE - Abnormal; Notable for the following components:      Result Value   Culture >=100,000 COLONIES/mL ESCHERICHIA COLI (*)    Organism ID, Bacteria ESCHERICHIA COLI (*)    All other components within normal limits  CBC WITH DIFFERENTIAL/PLATELET - Abnormal; Notable for the following components:   RDW 11.3 (*)    All other components within normal limits  URINALYSIS, W/ REFLEX TO CULTURE (INFECTION SUSPECTED) - Abnormal; Notable for the following components:   Color, Urine AMBER (*)     APPearance HAZY (*)    Nitrite POSITIVE (*)    Leukocytes,Ua MODERATE (*)    Bacteria, UA MANY (*)    All other components within normal limits  COMPREHENSIVE METABOLIC PANEL WITH GFR - Abnormal; Notable for the following components:   Sodium 131 (*)    CO2 21 (*)    Calcium  8.5 (*)    Total Protein 6.3 (*)    GFR, Estimated 57 (*)    All other components within normal limits    EKG: None  Radiology: No results found.  Procedures   Medications Ordered in the ED  ondansetron  (ZOFRAN -ODT) disintegrating tablet 4 mg (4 mg Oral Given 10/23/24 0912)  traMADol  (ULTRAM ) tablet 50 mg (50 mg Oral Given 10/23/24 0912)  ketorolac  (TORADOL ) 15 MG/ML injection 15 mg (15 mg Intramuscular Given 10/23/24 1254)                                 Medical Decision Making Risk Prescription drug management.   Patient presents to the ED for: UTI symptoms This involves an extensive number of treatment options  Differential diagnosis includes:  Acute cystitis Pyonephritis  Gynecologic etiology Co-morbid conditions: Hypertension  Additional history/records obtained and reviewed: Additional history obtained from  family -daughter presents as good historian for the visit  Clinical Course as of 10/30/24 0659  Mon Oct 23, 2024  1200 Temp: 97.9 F (36.6 C) Afebrile, vital stable, patient in no acute distress [ML]  1230 Comprehensive metabolic panel with GFR(!) No acute findings [ML]  1230 Urinalysis, w/ Reflex to Culture (Infection Suspected) -Urine, Clean Catch(!) Positive UTI [ML]  1230 CBC with Differential(!) No acute findings [ML]  1300 Patient given ketorolac , ondansetron , and tramadol  for symptomatic relief while in triage-well-tolerated [ML]    Clinical Course User Index [ML] Willma Duwaine CROME, PA    Data Reviewed / Actions Taken: Labs ordered/reviewed with my independent interpretation in ED course above.  Management / Treatments: See ED course above for medications,  treatments administered, and clinical rationale.   Reevaluation of the patient after these medicines showed that the patient improved.   I have reviewed the patients home medicines and have made adjustments as needed  ED Course / Reassessments: Problem List: UTI 89 year old female presented for UTI symptoms. Initial assessment included history, physical exam, and review of prior medical records.  Urinalysis demonstrated findings of acute cystitis - there is no concern for complicated urinary tract infection, pyelonephritis, nephrolithiasis or renal dysfunction given current presentation. Additional labs obtained were without acute finding. The patient was treated with, analgesia and antiemetic with improvement in symptoms and ability to tolerate oral intake. Given clinical presentation and stable evaluation, the patient was deemed appropriate for outpatient management.  Discharge planning including prescriptions for ibuprofen  600 mg for pain relief, and instructions to continue Keflex treatment regimen until urinary culture returns, instructions for adequate hydration and pain control.  Strict return precautions were discussed including worsening pain, new fever, persistent vomiting, inability to tolerate oral intake, decreased urine output, or new or worsening symptoms.   Disposition: Disposition: Discharge with close follow-up with PCP for further evaluation care Rationale for disposition: Stable for discharge The disposition plan and rationale were discussed with the patient at the bedside, all questions were addressed, and the patient demonstrated understanding.  This note was produced using Electronics Engineer. While I have reviewed and verified all clinical information, transcription errors may remain.      Final diagnoses:  Acute cystitis without hematuria    ED Discharge Orders          Ordered    ibuprofen  (ADVIL ) 600 MG tablet  Every 8 hours PRN        10/23/24 1253                Halston Fairclough L, [place] 10/30/24 9340    Neysa Caron PARAS, DO 11/02/24 0700  "

## 2024-10-23 NOTE — ED Provider Triage Note (Signed)
 Emergency Medicine Provider Triage Evaluation Note  Kristen [name] , a 89 y.o. female  was evaluated in triage.  Pt complains of dysuria, nausea. One week of symptoms without fever. She was prescribed Cipro 3 days ago and is no better. Seen at [facility] this morning, changed abx to keflex but she reports to ED because of the amount of pain she is having. .  Review of Systems  Positive:  Negative:   Physical Exam  BP (!) 148/64 (BP Location: Left Arm)   Pulse 74   Temp 97.9 F (36.6 C) (Oral)   Resp 16   Ht 5' 4 (1.626 m)   Wt 53.1 kg   LMP  (LMP Unknown)   SpO2 99%   BMI 20.08 kg/m  Gen:   Awake, no distress   Resp:  Normal effort  MSK:   Moves extremities without difficulty  Other:    Medical Decision Making  Medically screening exam initiated at 9:01 AM.  Appropriate orders placed.  Kristen [name] was informed that the remainder of the evaluation will be completed by another provider, this initial triage assessment does not replace that evaluation, and the importance of remaining in the ED until their evaluation is complete.     Kristen Balls, PA-C 10/23/24 9097

## 2024-10-23 NOTE — ED Triage Notes (Signed)
 UTI symptoms for a week, PCP called in cipro on Friday. Denies fever, no vomiting. C/o nausea

## 2024-10-25 LAB — URINE CULTURE: Culture: >=100000 — AB

## 2024-10-26 ENCOUNTER — Telehealth (HOSPITAL_BASED_OUTPATIENT_CLINIC_OR_DEPARTMENT_OTHER): Payer: Self-pay | Admitting: *Deleted

## 2024-10-26 NOTE — Progress Notes (Addendum)
 ED Antimicrobial Stewardship Positive Culture Follow Up   Kristen [name] is an 89 y.o. female who presented to [facility] on 10/23/2024 with a chief complaint of  Chief Complaint  Patient presents with   Dysuria    Recent Results (from the past 720 hours)  Urine Culture     Status: Abnormal   Collection Time: 10/23/24  9:03 AM   Specimen: Urine, Random  Result Value Ref Range Status   Specimen Description   Final    URINE, RANDOM Performed at [facility], [address], [place], [place] [address]    Special Requests   Final    NONE Reflexed from [id number] Performed at [facility], [address] [place], [place] [address]    Culture >=100,000 COLONIES/mL ESCHERICHIA COLI (A)  Final   Report Status 10/25/2024 FINAL  Final   Organism ID, Bacteria ESCHERICHIA COLI (A)  Final      Susceptibility   Escherichia coli - MIC*    AMPICILLIN 16 INTERMEDIATE Intermediate     CEFAZOLIN  (URINE) Value in next row Sensitive      4 SENSITIVEThis is a modified FDA-approved test that has been validated and its performance characteristics determined by the reporting laboratory.  This laboratory is certified under the Clinical Laboratory Improvement Amendments CLIA as qualified to perform high complexity clinical laboratory testing.    CEFEPIME Value in next row Sensitive      4 SENSITIVEThis is a modified FDA-approved test that has been validated and its performance characteristics determined by the reporting laboratory.  This laboratory is certified under the Clinical Laboratory Improvement Amendments CLIA as qualified to perform high complexity clinical laboratory testing.    ERTAPENEM Value in next row Sensitive      4 SENSITIVEThis is a modified FDA-approved test that has been validated and its performance characteristics determined by the reporting laboratory.  This laboratory is certified under the Clinical Laboratory Improvement Amendments CLIA as qualified to  perform high complexity clinical laboratory testing.    CEFTRIAXONE  Value in next row Sensitive      4 SENSITIVEThis is a modified FDA-approved test that has been validated and its performance characteristics determined by the reporting laboratory.  This laboratory is certified under the Clinical Laboratory Improvement Amendments CLIA as qualified to perform high complexity clinical laboratory testing.    CIPROFLOXACIN Value in next row Resistant      4 SENSITIVEThis is a modified FDA-approved test that has been validated and its performance characteristics determined by the reporting laboratory.  This laboratory is certified under the Clinical Laboratory Improvement Amendments CLIA as qualified to perform high complexity clinical laboratory testing.    GENTAMICIN Value in next row Sensitive      4 SENSITIVEThis is a modified FDA-approved test that has been validated and its performance characteristics determined by the reporting laboratory.  This laboratory is certified under the Clinical Laboratory Improvement Amendments CLIA as qualified to perform high complexity clinical laboratory testing.    NITROFURANTOIN Value in next row Sensitive      4 SENSITIVEThis is a modified FDA-approved test that has been validated and its performance characteristics determined by the reporting laboratory.  This laboratory is certified under the Clinical Laboratory Improvement Amendments CLIA as qualified to perform high complexity clinical laboratory testing.    TRIMETH/SULFA Value in next row Sensitive      4 SENSITIVEThis is a modified FDA-approved test that has been validated and its performance characteristics determined by the reporting laboratory.  This laboratory is certified under the Clinical Laboratory Improvement Amendments CLIA as qualified to perform high complexity clinical laboratory testing.    AMPICILLIN/SULBACTAM Value in next row Sensitive      4 SENSITIVEThis is a modified FDA-approved test that has  been validated and its performance characteristics determined by the reporting laboratory.  This laboratory is certified under the Clinical Laboratory Improvement Amendments CLIA as qualified to perform high complexity clinical laboratory testing.    PIP/TAZO Value in next row Sensitive      <=4 SENSITIVEThis is a modified FDA-approved test that has been validated and its performance characteristics determined by the reporting laboratory.  This laboratory is certified under the Clinical Laboratory Improvement Amendments CLIA as qualified to perform high complexity clinical laboratory testing.    MEROPENEM Value in next row Sensitive      <=4 SENSITIVEThis is a modified FDA-approved test that has been validated and its performance characteristics determined by the reporting laboratory.  This laboratory is certified under the Clinical Laboratory Improvement Amendments CLIA as qualified to perform high complexity clinical laboratory testing.    * >=100,000 COLONIES/mL ESCHERICHIA COLI    [x]  Treated with ciprofloxacin, organism resistant to prescribed antimicrobial [x]  Patient discharged originally without antimicrobial agent and treatment is now indicated   ED provider note from encounter not signed so unable to evaluate diagnosis and plan. Appears that pt was prescribed ciprofloxacin for UTI on 10/20/24 prior to this ED visit. E.coli resistant to ciprofloxacin. Per discussion with PA today, appears pt was likely instructed to continue ciprofloxacin at discharge.   Upon review of medication dispense report, can see a claim filed on 10/23/24 for cephalexin 500 mg capsule, #14 for 7 days prescribed by Rosina Ready, NP. This would appropriately cover E.coli UTI.    Plan: Call patient and instruct patient to stop taking ciprofloxacin. Instruct patient to take cephalexin course as prescribed.   IF dispense report is inaccurate, and pt does not have a prescription for cephalexin, will prescribe as  below.   New antibiotic prescription: cephalexin 500 mg PO BID x 7 days. No refills.   ED Provider: Carlo Cornish, PA-C  Ronal CHRISTELLA Rav, PharmD 10/26/2024, 12:38 PM Clinical Pharmacist  Addendum: Contacted by patient placement RN that patient's PCP prescribed macrobid on 12/31 and asked about the need to contact to change antibiotics to cephalexin. Per ED PA, will proceed with prescription for cephalexin instead of nitrofurantoin.  Ronal CHRISTELLA Rav, PharmD 10/26/2024 3:23 PM

## 2024-10-26 NOTE — Telephone Encounter (Signed)
 Post ED Visit - Positive Culture Follow-up: Successful Patient Follow-Up  Culture assessed and recommendations reviewed by:  [x]  Ronal Rav, Pharm.D. []  Venetia Gully, Pharm.D., BCPS AQ-ID []  Garrel Crews, Pharm.D., BCPS []  Almarie Lunger, [address].D., BCPS []  Anza, [place].D., BCPS, AAHIVP []  Rosaline Bihari, Pharm.D., BCPS, AAHIVP []  Vernell Meier, PharmD, BCPS []  Latanya Hint, PharmD, BCPS []  Donald Medley, PharmD, BCPS []  Rocky Bold, PharmD  Positive urine culture  []  Patient discharged without antimicrobial prescription and treatment is now indicated [x]  Organism is resistant to prescribed ED discharge antimicrobial (Cipro was prescribed prior to ED visit and is resistant) []  Patient with positive blood cultures  Changes discussed with ED provider: Carlo Cornish, PA-C New antibiotic prescription: Cephalexin 500 mg po BID x 7 days Called to [facility] in [place], [place]  Contacted patient, date 10/26/24, time 1604  Discussed culture report and plan with pt's daughter, Kristen [name], per pt request.  Kristen states that the pt's PCP prescribed macrobid which pt started this am.  (Pt did report vomiting after taking macrobid.)  Kristen voices concern about switching to keflex d/t pt's allergy to PCN.  I contacted EDP Carlo Cornish, PA and Ronal Rav, PharmD via secure chat with dtr's concerns.  They recommend stopping macrobid and starting keflex d/t pt's advanced age, and her success/lack of allergic reactions to cephalosporins in the past.  Their recommendation was relayed to pt's dtr, Kristen, who was still unsure about which abx her mother should take.  Kristen was advised to follow up with pt's PCP and pharmacist to further discuss concerns.  New prescription was called in to pt's pharmacy (left VM d/t pharmacy being closed for New Year's Day).  As Kristen requested, I relayed the above concerns in my voicemail message.  Kristen states she will give her mother the pm dose of macrobid  tonight (since the PCP office and pharmacy are closed for the holiday), and she will follow up with the PCP and pharmacist in the morning.    Kristen [name] 10/26/2024, 4:21 PM

## 2024-10-31 ENCOUNTER — Other Ambulatory Visit: Payer: Self-pay | Admitting: Physician Assistant

## 2024-10-31 DIAGNOSIS — N390 Urinary tract infection, site not specified: Secondary | ICD-10-CM

## 2024-11-01 ENCOUNTER — Encounter: Payer: Self-pay | Admitting: Physician Assistant

## 2024-11-02 ENCOUNTER — Ambulatory Visit
Admission: RE | Admit: 2024-11-02 | Discharge: 2024-11-02 | Disposition: A | Discharge status: Home / Self Care | Source: Ambulatory Visit | Attending: Physician Assistant | Admitting: Physician Assistant

## 2024-11-02 DIAGNOSIS — N390 Urinary tract infection, site not specified: Secondary | ICD-10-CM

## 2024-11-03 ENCOUNTER — Telehealth: Payer: Self-pay | Admitting: Licensed Clinical Social Worker

## 2024-11-10 ENCOUNTER — Other Ambulatory Visit: Payer: Self-pay | Admitting: Licensed Clinical Social Worker

## 2024-11-10 NOTE — Patient Outreach (Signed)
 Complex Care Management   Visit Note  11/10/2024  Name:  Kristen [name] MRN: [id number] DOB: [birth year]  Situation: Referral received for Complex Care Management related to Mental/Behavioral Health diagnosis Anxiety/Caregiver Stress I obtained verbal consent from Caregiver.  Visit completed with Caregiver  on the phone  Background:   Past Medical History:  Diagnosis Date   Anemia    Arthritis    B12 deficiency    Chronic kidney disease    Chronic pain disorder    trigeminal neuralgia   Depression    Dyslipidemia    Hypertension    PONV (postoperative nausea and vomiting)    Seizure disorder (HCC)    decades since last seizure   Tremor    Trigeminal nerve disorder     Assessment: Patient Reported Symptoms:  Cognitive Cognitive Status: No symptoms reported, Normal speech and language skills Cognitive/Intellectual Conditions Management [RPT]: None reported or documented in medical history or problem list   Health Maintenance Behaviors: Annual physical exam  Neurological Neurological Review of Symptoms: Vision changes Neurological Management Strategies: Coping strategies, Medication therapy, Routine screening  HEENT HEENT Symptoms Reported: No symptoms reported      Cardiovascular Cardiovascular Symptoms Reported: No symptoms reported    Respiratory Respiratory Symptoms Reported: No symptoms reported    Endocrine Endocrine Symptoms Reported: No symptoms reported    Gastrointestinal Gastrointestinal Symptoms Reported: No symptoms reported      Genitourinary Genitourinary Symptoms Reported: Other Other Genitourinary Symptoms: UTI Genitourinary Management Strategies: Adequate rest, Coping strategies, Medication therapy Genitourinary Comment: Pt recently tested to ensure she did not have kidney stones. Pt is compliant with antibiotics prescribed  Integumentary Integumentary Symptoms Reported: No symptoms reported    Musculoskeletal Musculoskelatal Symptoms Reviewed: Not  assessed        Psychosocial Psychosocial Symptoms Reported: Other, Anxiety - if selected complete GAD Other Psychosocial Conditions: Caregiver Stress Behavioral Management Strategies: Adequate rest, Coping strategies, Support system, Medication therapy Major Change/Loss/Stressor/Fears (CP): Medical condition, self Techniques to Cope with Loss/Stress/Change: Diversional activities, Medication Quality of Family Relationships: involved, supportive, helpful    11/10/2024    PHQ2-9 Depression Screening   Little interest or pleasure in doing things    Feeling down, depressed, or hopeless    PHQ-2 - Total Score    Trouble falling or staying asleep, or sleeping too much    Feeling tired or having little energy    Poor appetite or overeating     Feeling bad about yourself - or that you are a failure or have let yourself or your family down    Trouble concentrating on things, such as reading the newspaper or watching television    Moving or speaking so slowly that other people could have noticed.  Or the opposite - being so fidgety or restless that you have been moving around a lot more than usual    Thoughts that you would be better off dead, or hurting yourself in some way    PHQ2-9 Total Score    If you checked off any problems, how difficult have these problems made it for you to do your work, take care of things at home, or get along with other people    Depression Interventions/Treatment      There were no vitals filed for this visit.    Medications Reviewed Today     Reviewed by Ezzard Rolin BIRCH, LCSW (Social Worker) on 11/10/24 at 0909  Med List Status: <None>   Medication Order Taking? Sig Documenting Provider Last Dose Status  Informant  cyanocobalamin  (,VITAMIN B-12,) 1000 MCG/ML injection [id number]  Inject 100 mcg into the muscle every 30 (thirty) days. [provider]  Active Other, Pharmacy Records, Multiple Informants, Care Giver, Self           Med Note RENNIS,  DUROJAHYE' R   Mon Mar 13, 2024 10:32 PM)    desvenlafaxine (PRISTIQ) 25 MG 24 hr tablet [id number]  Take 25 mg by mouth every morning. [provider]  Active Other, Pharmacy Records, Multiple Informants, Care Giver, Self  diclofenac  Sodium (VOLTAREN ) 1 % GEL [id number]  Apply 4 g topically 4 (four) times daily as needed (For pain). [provider]  Active Other, Pharmacy Records, Multiple Informants, Care Giver, Self  docusate sodium  (COLACE) 50 MG capsule [id number]  Take 2 capsules (100 mg total) by mouth daily as needed for mild constipation. Amin, Ankit C, MD  Active   ELIQUIS  5 MG TABS tablet [id number]  Take 5 mg by mouth 2 (two) times daily. [provider]  Active Other, Pharmacy Records, Multiple Informants, Care Giver, Self  esomeprazole (NEXIUM) 40 MG capsule [id number]  Take 40 mg by mouth daily. [provider]  Active Other, Pharmacy Records, Multiple Informants, Care Giver, Self  FEROSUL 325 (65 Fe) MG tablet [id number]  Take 325 mg by mouth every other day.  Patient not taking: Reported on 10/06/2024   [provider]  Active Other, Pharmacy Records, Multiple Informants, Care Giver, Self  HYDROmorphone  (DILAUDID ) 2 MG tablet [id number]  Take 1 tablet (2 mg total) by mouth in the morning and at bedtime. Amin, Ankit C, MD  Active   hydrOXYzine  (VISTARIL ) 25 MG capsule [id number]  Take 25 mg by mouth at bedtime. [provider]  Active Other, Pharmacy Records, Multiple Informants, Care Giver, Self           Med Note (SATTERFIELD, TEENA BRAVO   Fri Dec 24, 2023  3:41 PM) Daughter verified patient is taking this medication   lidocaine  (XYLOCAINE ) 2 % solution [id number]  Use as directed 15 mLs in the mouth or throat 2 (two) times daily as needed for mouth pain. [provider]  Active Other, Pharmacy Records, Multiple Informants, Care Giver, Self  losartan  (COZAAR ) 100 MG tablet [id number]  Take 100 mg by mouth daily. [provider]  Active Other, Pharmacy Records, Multiple Informants, Care Giver, Self  metoCLOPramide  (REGLAN ) 10 MG tablet [id number]  Take 1 tablet (10 mg total) by mouth every 8 (eight) hours as needed for nausea. Tegeler, Lonni PARAS, MD  Active   metoCLOPramide  (REGLAN ) 5 MG tablet [id number]  Take 1 tablet (5 mg total) by mouth 3 (three) times daily before meals.  Patient taking differently: Take 5 mg by mouth every 8 (eight) hours as needed for vomiting, refractory nausea / vomiting or nausea.   Shalhoub, Zachary PARAS, MD  Active Other, Pharmacy Records, Multiple Informants, Care Giver, Self  ondansetron  (ZOFRAN ) 8 MG tablet [id number]  Take 8 mg by mouth every 8 (eight) hours as needed for nausea or vomiting. [provider]  Active Other, Pharmacy Records, Multiple Informants, Care Giver, Self  prochlorperazine (COMPAZINE) 5 MG tablet [id number]  Take 5 mg by mouth every 6 (six) hours as needed for vomiting or nausea. [provider]  Active Other, Pharmacy Records, Multiple Informants, Care Giver, Self  sucralfate  (CARAFATE ) 1 g tablet [id number]  Take 1 g by mouth 2 (two) times daily. [provider]  Active Other, Pharmacy Records, Multiple Informants, Care Giver, Self  Syringe/Needle, Disp, (SYRINGE 3CC/25GX1-1/2) 25G X 1-1/2 3 ML MISC [id number]  1 each by Does not apply route every 30 (thirty) days. For B12 injection [provider]  Active Other, Pharmacy Records, Multiple Informants, Care Giver, Self  Med List Note Steffi Nian, CPhT 03/14/24 1304): [facility]             Recommendation:   Continue Current Plan of Care  Follow Up Plan:   Telephone follow-up in 1 month  Rolin Kerns, LCSW [facility]  [facility], [facility] Clinical Social Worker Direct Dial: [phone]  Fax: [phone] Website: [email or web address] 9:49 AM

## 2024-11-10 NOTE — Patient Instructions (Signed)
 Visit Information  Thank you for taking time to visit with me today. Please don't hesitate to contact me if I can be of assistance to you before our next scheduled appointment.  Your next care management appointment is by telephone on 2/20 at 9 AM  Please call the care guide team at [phone] if you need to cancel, schedule, or reschedule an appointment.   Please call the Suicide and Crisis Lifeline: 988 go to [facility] [address] [place] [phone]) call 911 if you are experiencing a Mental Health or Behavioral Health Crisis or need someone to talk to.  Rolin Kerns, LCSW [place]  [facility], [facility] Clinical Social Worker Direct Dial: [phone]  Fax: [phone] Website: [email or web address] 9:50 AM

## 2024-11-13 ENCOUNTER — Encounter: Payer: Self-pay | Admitting: Emergency Medicine

## 2024-11-13 ENCOUNTER — Ambulatory Visit
Admission: EM | Admit: 2024-11-13 | Discharge: 2024-11-13 | Disposition: A | Discharge status: Home / Self Care | Attending: Family Medicine | Admitting: Family Medicine

## 2024-11-13 DIAGNOSIS — R300 Dysuria: Secondary | ICD-10-CM | POA: Insufficient documentation

## 2024-11-13 DIAGNOSIS — N3001 Acute cystitis with hematuria: Secondary | ICD-10-CM | POA: Diagnosis not present

## 2024-11-13 LAB — POCT URINE DIPSTICK
Blood, UA: NEGATIVE
Glucose, UA: 250 mg/dL — AB
Nitrite, UA: POSITIVE — AB
Protein Ur, POC: 100 mg/dL — AB
Spec Grav, UA: <=1.005 — AB
Urobilinogen, UA: >=8 U/dL — AB
pH, UA: 5

## 2024-11-13 MED ORDER — PHENAZOPYRIDINE HCL 200 MG PO TABS: 200.0000 mg | ORAL_TABLET | Freq: Three times a day (TID) | ORAL | 0 refills | Status: AC

## 2024-11-13 MED ORDER — CEFDINIR 300 MG PO CAPS: 300.0000 mg | ORAL_CAPSULE | Freq: Two times a day (BID) | ORAL | 0 refills | Status: AC

## 2024-11-13 NOTE — ED Triage Notes (Signed)
 Patient c/o dysuria for several weeks.  Patient has been seen and treated for a UTI.  Patient has been on Cipro then changed to Macrobid then Keflex and back to Macrobid.  Patient still hurts.

## 2024-11-13 NOTE — Discharge Instructions (Addendum)
 Advised patient take medication as directed with food to completion.  Encouraged to increase daily water intake while taking this medication.  Advised we will follow-up with urine culture results once received.  Advised if symptoms worsen and/or unresolved please follow-up with your PCP or here for further evaluation.

## 2024-11-13 NOTE — ED Provider Notes (Signed)
 " Kristen [name]    CSN: [id number] Arrival date & time: 11/13/24  1246      History   Chief Complaint Chief Complaint  Patient presents with   Dysuria    HPI Kristen [name] is a 89 y.o. female.   HPI pleasant 89 year old female presents with dysuria for several weeks.  Daughter reports it was initially on Cipro then changed to Macrobid then Keflex and then back to Macrobid.  Recent urine culture report reveals sensitivity to Macrobid.  Past Medical History:  Diagnosis Date   Anemia    Arthritis    B12 deficiency    Chronic kidney disease    Chronic pain disorder    trigeminal neuralgia   Depression    Dyslipidemia    Hypertension    PONV (postoperative nausea and vomiting)    Seizure disorder (HCC)    decades since last seizure   Tremor    Trigeminal nerve disorder     Patient Active Problem List   Diagnosis Date Noted   Hyponatremia 03/14/2024   Intramuscular hematoma 03/13/2024   Anxiety 03/13/2024   Adenocarcinoma of lung (HCC) 12/29/2023   Hypomagnesemia 12/29/2023   Trigeminal neuralgia 12/29/2023   E-coli UTI 12/29/2023   Intractable nausea 12/26/2023   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   S/P total right hip arthroplasty 10/30/2023   Closed right hip fracture, initial encounter (HCC) 10/29/2023   History of pulmonary embolism 10/29/2023   Renal cyst, right 10/29/2023   Leukocytosis 10/29/2023   DOE (dyspnea on exertion) 05/20/2022   Chronic rhinitis 05/20/2022   Dysphagia 05/20/2022   Pulmonary embolism (HCC) 11/26/2021   Acute pulmonary embolism without acute cor pulmonale (HCC) 11/23/2021   Pulmonary mass 11/23/2021   Chronic pain disorder 11/23/2021   CKD stage 3a, GFR 45-59 ml/min (HCC) 11/23/2021   DNR (do not resuscitate) 11/23/2021   Tricuspid regurgitation 10/07/2021   Pulmonary hypertension, unspecified (HCC) 10/07/2021   Hyperlipidemia 10/07/2021   Essential hypertension 10/07/2021   Atypical chest  pain 10/07/2021    Past Surgical History:  Procedure Laterality Date   ABDOMINAL HYSTERECTOMY     BLADDER REPAIR     tack   CATARACT EXTRACTION     CHOLECYSTECTOMY     ESOPHAGOGASTRODUODENOSCOPY N/A 12/30/2023   Procedure: EGD (ESOPHAGOGASTRODUODENOSCOPY);  Surgeon: Kriss Estefana DEL, DO;  Location: [facility];  Service: Gastroenterology;  Laterality: N/A;   gamma knife  2004   NOSE SURGERY     for fracture   SHOULDER ARTHROSCOPY DISTAL CLAVICLE EXCISION AND OPEN ROTATOR CUFF REPAIR Left    rotary cuff repair   TONSILLECTOMY     TOTAL HIP ARTHROPLASTY Right 10/30/2023   Procedure: TOTAL HIP ARTHROPLASTY ANTERIOR APPROACH;  Surgeon: Ernie Cough, MD;  Location: [name] OR;  Service: Orthopedics;  Laterality: Right;   TOTAL KNEE ARTHROPLASTY Bilateral    UMBILICAL HERNIA REPAIR      OB History   No obstetric history on file.      Home Medications    Prior to Admission medications  Medication Sig Start Date End Date Taking? Authorizing Provider  cefdinir  (OMNICEF ) 300 MG capsule Take 1 capsule (300 mg total) by mouth 2 (two) times daily for 7 days. 11/13/24 11/20/24 Yes Teddy Sharper, FNP  cyanocobalamin  (,VITAMIN B-12,) 1000 MCG/ML injection Inject 100 mcg into the muscle every 30 (thirty) days. 11/02/19  Yes [provider]  desvenlafaxine (PRISTIQ) 25 MG 24 hr tablet Take 25 mg by mouth every morning. 02/29/24  Yes [provider]  diclofenac  Sodium (VOLTAREN ) 1 % GEL Apply 4 g topically 4 (four) times daily as needed (For pain).   Yes [provider]  docusate sodium  (COLACE) 50 MG capsule Take 2 capsules (100 mg total) by mouth daily as needed for mild constipation. 03/16/24  Yes Amin, Ankit C, MD  esomeprazole (NEXIUM) 40 MG capsule Take 40 mg by mouth daily.   Yes [provider]  HYDROmorphone  (DILAUDID ) 2 MG tablet Take 1 tablet (2 mg total) by mouth in the morning and at bedtime. 03/16/24  Yes Amin, Ankit C, MD  hydrOXYzine  (VISTARIL ) 25 MG  capsule Take 25 mg by mouth at bedtime.   Yes [provider]  lidocaine  (XYLOCAINE ) 2 % solution Use as directed 15 mLs in the mouth or throat 2 (two) times daily as needed for mouth pain. 03/10/24  Yes [provider]  losartan  (COZAAR ) 100 MG tablet Take 100 mg by mouth daily. 02/29/24  Yes [provider]  metoCLOPramide  (REGLAN ) 10 MG tablet Take 1 tablet (10 mg total) by mouth every 8 (eight) hours as needed for nausea. 05/13/24  Yes Tegeler, Lonni PARAS, MD  metoCLOPramide  (REGLAN ) 5 MG tablet Take 1 tablet (5 mg total) by mouth 3 (three) times daily before meals. Patient taking differently: Take 5 mg by mouth every 8 (eight) hours as needed for vomiting, refractory nausea / vomiting or nausea. 12/30/23  Yes Shalhoub, Zachary PARAS, MD  ondansetron  (ZOFRAN ) 8 MG tablet Take 8 mg by mouth every 8 (eight) hours as needed for nausea or vomiting.   Yes [provider]  phenazopyridine  (PYRIDIUM ) 200 MG tablet Take 1 tablet (200 mg total) by mouth 3 (three) times daily for 4 days. 11/13/24 11/17/24 Yes Teddy Sharper, FNP  prochlorperazine (COMPAZINE) 5 MG tablet Take 5 mg by mouth every 6 (six) hours as needed for vomiting or nausea. 12/22/23  Yes [provider]  sucralfate  (CARAFATE ) 1 g tablet Take 1 g by mouth 2 (two) times daily. 03/04/24  Yes [provider]  [Paused] ELIQUIS  5 MG TABS tablet Take 5 mg by mouth 2 (two) times daily. Wait to take this until your doctor or other [name] provider tells you to start again. 09/28/22   [provider]  FEROSUL 325 (65 Fe) MG tablet Take 325 mg by mouth every other day. Patient not taking: Reported on 10/06/2024 02/29/24   [provider]  Syringe/Needle, Disp, (SYRINGE 3CC/25GX1-1/2) 25G X 1-1/2 3 ML MISC 1 each by Does not apply route every 30 (thirty) days. For B12 injection    [provider]    Family History History reviewed. No pertinent family history.  Social  History Social History[1]   Allergies   Penicillins, Tetracyclines & related, and Sulfa antibiotics   Review of Systems Review of Systems  Genitourinary:  Positive for dysuria and frequency.  All other systems reviewed and are negative.    Physical Exam Triage Vital Signs ED Triage Vitals  Encounter Vitals Group     BP      Girls Systolic BP Percentile      Girls Diastolic BP Percentile      Boys Systolic BP Percentile      Boys Diastolic BP Percentile      Pulse      Resp      Temp      Temp src      SpO2      Weight      Height  Head Circumference      Peak Flow      Pain Score      Pain Loc      Pain Education      Exclude from Growth Chart    No data found.  Updated Vital Signs BP 134/71 (BP Location: Left Arm)   Pulse (!) 111   Temp 98.1 F (36.7 C) (Oral)   Resp 18   LMP  (LMP Unknown)   SpO2 95%   Visual Acuity Right Eye Distance:   Left Eye Distance:   Bilateral Distance:    Right Eye Near:   Left Eye Near:    Bilateral Near:     Physical Exam Vitals and nursing note reviewed.  Constitutional:      Appearance: Normal appearance. She is normal weight. She is ill-appearing.  HENT:     Head: Normocephalic and atraumatic.     Mouth/Throat:     Mouth: Mucous membranes are moist.     Pharynx: Oropharynx is clear.  Eyes:     Extraocular Movements: Extraocular movements intact.     Conjunctiva/sclera: Conjunctivae normal.     Pupils: Pupils are equal, round, and reactive to light.  Cardiovascular:     Rate and Rhythm: Normal rate and regular rhythm.     Heart sounds: Normal heart sounds.  Pulmonary:     Effort: Pulmonary effort is normal.     Breath sounds: Normal breath sounds. No wheezing, rhonchi or rales.  Abdominal:     Tenderness: There is no right CVA tenderness or left CVA tenderness.  Musculoskeletal:        General: Normal range of motion.  Skin:    General: Skin is warm and dry.  Neurological:     General: No focal  deficit present.     Mental Status: She is alert and oriented to person, place, and time.      UC Treatments / Results  Labs (all labs ordered are listed, but only abnormal results are displayed) Labs Reviewed  POCT URINE DIPSTICK - Abnormal; Notable for the following components:      Result Value   Color, UA orange (*)    Glucose, UA =250 (*)    Bilirubin, UA small (*)    Ketones, POC UA small (15) (*)    Spec Grav, UA <=1.005 (*)    Protein Ur, POC =100 (*)    Urobilinogen, UA >=8.0 (*)    Nitrite, UA Positive (*)    Leukocytes, UA Large (3+) (*)    All other components within normal limits  URINE CULTURE    EKG   Radiology No results found.  Procedures Procedures (including critical [name] time)  Medications Ordered in UC Medications - No data to display  Initial Impression / Assessment and Plan / UC Course  I have reviewed the triage vital signs and the nursing notes.  Pertinent labs & imaging results that were available during my [name] of the patient were reviewed by me and considered in my medical decision making (see chart for details).     MDM: 1.  Acute cystitis with hematuria-UA revealed above, urine culture ordered, Rx'd cefdinir  300 mg capsule: Take 1 capsule twice daily x 7 days; 2.  Dysuria-Rx'd Pyridium  200 mg tablet: Take 1 tablet 3 times daily for the next 4 days. Advised patient take medication as directed with food to completion.  Encouraged to increase daily water intake while taking this medication.  Advised we will follow-up with urine culture  results once received.  Advised if symptoms worsen and/or unresolved please follow-up with your PCP or here for further evaluation.  Final Clinical Impressions(s) / UC Diagnoses   Final diagnoses:  Acute cystitis with hematuria  Dysuria     Discharge Instructions      Advised patient take medication as directed with food to completion.  Encouraged to increase daily water intake while taking this  medication.  Advised we will follow-up with urine culture results once received.  Advised if symptoms worsen and/or unresolved please follow-up with your PCP or here for further evaluation.     ED Prescriptions     Medication Sig Dispense Auth. Provider   cefdinir  (OMNICEF ) 300 MG capsule Take 1 capsule (300 mg total) by mouth 2 (two) times daily for 7 days. 14 capsule Aneita Kiger, FNP   phenazopyridine  (PYRIDIUM ) 200 MG tablet Take 1 tablet (200 mg total) by mouth 3 (three) times daily for 4 days. 12 tablet Champ Keetch, FNP      PDMP not reviewed this encounter.     [1]  Social History Tobacco Use   Smoking status: Never    Passive exposure: Never   Smokeless tobacco: Never  Vaping Use   Vaping status: Never Used  Substance Use Topics   Alcohol use: Not Currently   Drug use: Never     Teddy Sharper, FNP 11/13/24 1422  "

## 2024-11-14 LAB — URINE CULTURE: Culture: NO GROWTH

## 2024-12-07 ENCOUNTER — Ambulatory Visit: Admitting: Urology

## 2024-12-15 ENCOUNTER — Telehealth: Admitting: Licensed Clinical Social Worker
